# Patient Record
Sex: Female | Born: 1945
Health system: Southern US, Community
[De-identification: ages and names within clinical notes are randomized; demographics above are authoritative.]

## PROBLEM LIST (undated history)

## (undated) DIAGNOSIS — E7800 Pure hypercholesterolemia, unspecified: Secondary | ICD-10-CM

## (undated) DIAGNOSIS — E039 Hypothyroidism, unspecified: Secondary | ICD-10-CM

## (undated) DIAGNOSIS — Z9109 Other allergy status, other than to drugs and biological substances: Secondary | ICD-10-CM

## (undated) DIAGNOSIS — M1990 Unspecified osteoarthritis, unspecified site: Secondary | ICD-10-CM

## (undated) DIAGNOSIS — K209 Esophagitis, unspecified without bleeding: Secondary | ICD-10-CM

## (undated) DIAGNOSIS — K219 Gastro-esophageal reflux disease without esophagitis: Secondary | ICD-10-CM

## (undated) HISTORY — DX: Hypothyroidism, unspecified: E03.9

## (undated) HISTORY — DX: Gastro-esophageal reflux disease without esophagitis: K21.9

## (undated) HISTORY — DX: Pure hypercholesterolemia, unspecified: E78.00

## (undated) HISTORY — DX: Esophagitis, unspecified: K20.9

## (undated) HISTORY — DX: Unspecified osteoarthritis, unspecified site: M19.90

## (undated) HISTORY — DX: Esophagitis, unspecified without bleeding: K20.90

## (undated) HISTORY — DX: Other allergy status, other than to drugs and biological substances: Z91.09

---

## 1965-06-13 HISTORY — PX: ANKLE SURGERY: SHX546

## 1968-06-13 HISTORY — PX: WRIST SURGERY: SHX841

## 1978-06-13 HISTORY — PX: VAGINAL HYSTERECTOMY: SHX2639

## 1984-06-13 HISTORY — PX: RHINOPLASTY: SUR1284

## 2000-10-12 ENCOUNTER — Encounter: Payer: Self-pay | Admitting: Gastroenterology

## 2000-10-12 ENCOUNTER — Encounter: Admission: RE | Admit: 2000-10-12 | Discharge: 2000-10-12 | Payer: Self-pay | Admitting: Gastroenterology

## 2000-10-18 ENCOUNTER — Ambulatory Visit (HOSPITAL_COMMUNITY): Admission: RE | Admit: 2000-10-18 | Discharge: 2000-10-18 | Payer: Self-pay | Admitting: Gastroenterology

## 2000-10-18 ENCOUNTER — Encounter (INDEPENDENT_AMBULATORY_CARE_PROVIDER_SITE_OTHER): Payer: Self-pay | Admitting: Specialist

## 2001-02-14 ENCOUNTER — Encounter (INDEPENDENT_AMBULATORY_CARE_PROVIDER_SITE_OTHER): Payer: Self-pay | Admitting: Specialist

## 2001-02-14 ENCOUNTER — Ambulatory Visit (HOSPITAL_COMMUNITY): Admission: RE | Admit: 2001-02-14 | Discharge: 2001-02-14 | Payer: Self-pay | Admitting: Gastroenterology

## 2001-04-26 ENCOUNTER — Ambulatory Visit (HOSPITAL_COMMUNITY): Admission: RE | Admit: 2001-04-26 | Discharge: 2001-04-26 | Payer: Self-pay | Admitting: Gastroenterology

## 2001-04-26 ENCOUNTER — Encounter (INDEPENDENT_AMBULATORY_CARE_PROVIDER_SITE_OTHER): Payer: Self-pay | Admitting: Specialist

## 2001-07-25 ENCOUNTER — Encounter (INDEPENDENT_AMBULATORY_CARE_PROVIDER_SITE_OTHER): Payer: Self-pay

## 2001-07-25 ENCOUNTER — Ambulatory Visit (HOSPITAL_COMMUNITY): Admission: RE | Admit: 2001-07-25 | Discharge: 2001-07-25 | Payer: Self-pay | Admitting: Gastroenterology

## 2002-04-30 ENCOUNTER — Encounter (INDEPENDENT_AMBULATORY_CARE_PROVIDER_SITE_OTHER): Payer: Self-pay | Admitting: Specialist

## 2002-04-30 ENCOUNTER — Ambulatory Visit (HOSPITAL_COMMUNITY): Admission: RE | Admit: 2002-04-30 | Discharge: 2002-04-30 | Payer: Self-pay | Admitting: Gastroenterology

## 2002-08-19 ENCOUNTER — Ambulatory Visit (HOSPITAL_COMMUNITY): Admission: RE | Admit: 2002-08-19 | Discharge: 2002-08-19 | Payer: Self-pay | Admitting: Gastroenterology

## 2002-08-19 ENCOUNTER — Encounter (INDEPENDENT_AMBULATORY_CARE_PROVIDER_SITE_OTHER): Payer: Self-pay | Admitting: *Deleted

## 2003-03-24 ENCOUNTER — Ambulatory Visit (HOSPITAL_COMMUNITY): Admission: RE | Admit: 2003-03-24 | Discharge: 2003-03-24 | Payer: Self-pay | Admitting: Gastroenterology

## 2003-03-24 ENCOUNTER — Encounter (INDEPENDENT_AMBULATORY_CARE_PROVIDER_SITE_OTHER): Payer: Self-pay | Admitting: *Deleted

## 2004-07-13 ENCOUNTER — Ambulatory Visit: Payer: Self-pay | Admitting: Otolaryngology

## 2005-05-13 HISTORY — PX: NASAL SINUS SURGERY: SHX719

## 2005-05-19 ENCOUNTER — Ambulatory Visit: Payer: Self-pay | Admitting: Otolaryngology

## 2006-04-03 ENCOUNTER — Ambulatory Visit (HOSPITAL_COMMUNITY): Admission: RE | Admit: 2006-04-03 | Discharge: 2006-04-03 | Payer: Self-pay | Admitting: Gastroenterology

## 2006-04-03 ENCOUNTER — Encounter (INDEPENDENT_AMBULATORY_CARE_PROVIDER_SITE_OTHER): Payer: Self-pay | Admitting: *Deleted

## 2007-11-19 ENCOUNTER — Ambulatory Visit: Payer: Self-pay | Admitting: General Practice

## 2007-12-03 ENCOUNTER — Ambulatory Visit: Payer: Self-pay | Admitting: General Practice

## 2007-12-03 HISTORY — PX: KNEE ARTHROSCOPY: SHX127

## 2008-01-23 ENCOUNTER — Encounter: Payer: Self-pay | Admitting: General Practice

## 2008-02-12 ENCOUNTER — Encounter: Payer: Self-pay | Admitting: General Practice

## 2008-05-22 ENCOUNTER — Ambulatory Visit: Payer: Self-pay | Admitting: General Practice

## 2008-06-25 ENCOUNTER — Ambulatory Visit: Payer: Self-pay | Admitting: General Practice

## 2008-06-25 HISTORY — PX: KNEE ARTHROSCOPY: SHX127

## 2009-01-04 HISTORY — PX: REPLACEMENT TOTAL KNEE: SUR1224

## 2009-01-06 ENCOUNTER — Inpatient Hospital Stay (HOSPITAL_COMMUNITY): Admission: RE | Admit: 2009-01-06 | Discharge: 2009-01-10 | Payer: Self-pay | Admitting: Specialist

## 2010-09-19 LAB — CBC
HCT: 24.5 % — ABNORMAL LOW (ref 36.0–46.0)
HCT: 25.3 % — ABNORMAL LOW (ref 36.0–46.0)
HCT: 26.9 % — ABNORMAL LOW (ref 36.0–46.0)
Hemoglobin: 8.4 g/dL — ABNORMAL LOW (ref 12.0–15.0)
Hemoglobin: 8.7 g/dL — ABNORMAL LOW (ref 12.0–15.0)
Hemoglobin: 9.3 g/dL — ABNORMAL LOW (ref 12.0–15.0)
MCHC: 34.2 g/dL (ref 30.0–36.0)
MCHC: 34.5 g/dL (ref 30.0–36.0)
MCHC: 34.5 g/dL (ref 30.0–36.0)
MCV: 97.4 fL (ref 78.0–100.0)
MCV: 98.1 fL (ref 78.0–100.0)
MCV: 98.2 fL (ref 78.0–100.0)
Platelets: 174 10*3/uL (ref 150–400)
Platelets: 175 10*3/uL (ref 150–400)
Platelets: 180 10*3/uL (ref 150–400)
RBC: 2.5 MIL/uL — ABNORMAL LOW (ref 3.87–5.11)
RBC: 2.6 MIL/uL — ABNORMAL LOW (ref 3.87–5.11)
RBC: 2.74 MIL/uL — ABNORMAL LOW (ref 3.87–5.11)
RDW: 12.2 % (ref 11.5–15.5)
RDW: 12.5 % (ref 11.5–15.5)
RDW: 12.6 % (ref 11.5–15.5)
WBC: 7.5 10*3/uL (ref 4.0–10.5)
WBC: 8.7 10*3/uL (ref 4.0–10.5)
WBC: 8.9 10*3/uL (ref 4.0–10.5)

## 2010-09-19 LAB — BASIC METABOLIC PANEL WITH GFR
BUN: 5 mg/dL — ABNORMAL LOW (ref 6–23)
BUN: 6 mg/dL (ref 6–23)
CO2: 29 meq/L (ref 19–32)
CO2: 30 meq/L (ref 19–32)
Calcium: 8 mg/dL — ABNORMAL LOW (ref 8.4–10.5)
Calcium: 8.8 mg/dL (ref 8.4–10.5)
Chloride: 100 meq/L (ref 96–112)
Chloride: 104 meq/L (ref 96–112)
Creatinine, Ser: 0.53 mg/dL (ref 0.4–1.2)
Creatinine, Ser: 0.56 mg/dL (ref 0.4–1.2)
GFR calc non Af Amer: >60 mL/min
GFR calc non Af Amer: >60 mL/min
Glucose, Bld: 121 mg/dL — ABNORMAL HIGH (ref 70–99)
Glucose, Bld: 144 mg/dL — ABNORMAL HIGH (ref 70–99)
Potassium: 3.9 meq/L (ref 3.5–5.1)
Potassium: 4.1 meq/L (ref 3.5–5.1)
Sodium: 133 meq/L — ABNORMAL LOW (ref 135–145)
Sodium: 139 meq/L (ref 135–145)

## 2010-09-19 LAB — CROSSMATCH
ABO/RH(D): A POS
Antibody Screen: NEGATIVE

## 2010-09-19 LAB — ABO/RH: ABO/RH(D): A POS

## 2010-10-26 NOTE — Op Note (Signed)
[name], [name]                 ACCOUNT NO.:  [id number]   MEDICAL RECORD NO.:  [id number]          PATIENT TYPE:  INP   LOCATION:  0007                         FACILITY:  [facility]   PHYSICIAN:  Erasmo Leventhal, M.D.DATE OF BIRTH:  [birth year]   DATE OF PROCEDURE:  01/06/2009  DATE OF DISCHARGE:                               OPERATIVE REPORT   PREOPERATIVE DIAGNOSIS:  Left knee end-stage osteoarthritis.   POSTOPERATIVE DIAGNOSIS:  Left knee end-stage osteoarthritis.   PROCEDURE:  Left total knee arthroplasty.   SURGEON:  Valma Cava, M.D.   ASSISTANT:  Oneida Alar, P.A.-C.   ANESTHESIA:  Spinal with Duramorph, monitored anesthesia care.   BLOOD LOSS:  Less 50 mL.   DRAINS:  One medium Hemovac.   COMPLICATIONS:  None.   TOURNIQUET TIME:  1 hour and 50 minutes at 300 mmHg.   OPERATIVE IMPLANTS:  [employer].  Sigma.  Size 4 narrow  femur, size 3 tibia, size 32 patella, 10-mm posterior stabilized  rotating platform tibial insert.  All cemented.   OPERATIVE DETAILS:  The patient and family counseled in holding area.  Correct side was identified.  She was taken to the operating room where  a spinal anesthetic was administered and monitored anesthesia care.  Foley catheter was placed utilizing sterile technique by the OR  circulating nurse.  All extremities were well padded with bump.  It was  noted she had a malunion of a tibia-fibular fracture from the 1970s.  This was calculated into her surgical event.   She was elevated.  She was prepped with DuraPrep and draped in sterile  fashion.  Exsanguinated with Esmarch, and tourniquet was inflated to 300  mmHg.  Straight midline incision was made in the skin and subcutaneous  tissue.  Medial and lateral soft tissue flaps were developed.  Medial  parapatellar arthrotomy was performed, and proximal medial soft tissue  release was then done.  Knee was then flexed, end-stage arthritis  changes, bone against bone.   Cruciate ligaments were resected.  Starter  hole made in the femur.  Canal was irrigated until the effluent was  clear.  An intramedullary rod was placed.  A 5 degrees valgus cut was  done with a 10-mm cut off the distal femur.  Distal femur was found be a  size #4.  Rotation marks set, cutting block was applied, cut to a size  #4.   Medial and lateral menisci were removed under direct visualization.  At  this point in time, we spent quite a bit of time with extramedullary  alignment, trying to calculate for mechanical axis based upon  consideration for her malunion.  This was done meticulously, posterior  degree slope.  We took a 10-mm cut off the proximal tibial and then  supplemented with __________ mm flexion extension blocks.  Posterior  medial and posterior femoral osteophytes were removed under direct  visualization.  At this time, with flexion and extension blocks for a 10-  mm insert, we had well-balanced flexion and extension gaps.  She had  excellent clinical alignment.  Tibia was found  be a size #3.  Rotation  coverage was set utilizing assistance of extramedullary alignment also.  Reamer was performed and a punch.  Femoral box cut was performed at this  time with a size 4 narrow femur, size 3 tibia, 10 insert and were well-  balanced flexion and extension in excellent alignment, approximately 5  degrees of valgus anatomical axis with mechanical axis going directly to  the center of the ankle joint.  Patella found be a size #32, rotation  covered.  Appropriate amount of bone was resected and patella tracked  anatomically.  All trials were removed, knee was irrigated with  pulsatile lavage and cement was mixed.  At this point in time, all  components were cemented into place, particularly low modern cement  technique, size 3 tibia, size 4 narrow femur, size 32 patella.  After  cement had cured, we did trials of 10 and 12.5-mm inserts.  With a 10  insert, was well balanced in  flexion and extension.  She was stable to  varus and valgus stress at 0, 30, 60, and 90 degrees of flexion.  Trials  were removed.  Knee was irrigated.  Excess cement was removed and put in  a final 10 mm posterior stabilized rotating platform tibial insert.  We  now had excellent clinical alignment, rotation, balance and coverage.  Medial Hemovac drain was placed.  Sequential closure of layers was done.  Arthrotomy was closed in 90 degrees of flexion with Vicryl suture in  figure-of-eight fashion, subcutaneous Vicryl, skin with a subcuticular  Monocryl suture.  Steri-Strips were applied.  Sterile dressings applied.  Tourniquet was deflated.  Normal circulation in the foot and ankle at  the end of the case.  She was given __________ Ancef intravenously.  The  patient tolerated the procedure well, and there were no complications or  problems.  She was taken from the operating room to the PACU in stable  condition.   Surgeon was assisted with Oneida Alar, P.A.-C.  To help with surgical  technique and decision making, Mr. Leana Gamer, P.A.-C., assistance was  needed throughout the entire case.           ______________________________  Erasmo Leventhal, M.D.     RAC/MEDQ  D:  01/06/2009  T:  01/07/2009  Job:  [id number]

## 2010-10-29 NOTE — Op Note (Signed)
   NAMEABBI, MANCINI                           ACCOUNT NO.:  [id number]   MEDICAL RECORD NO.:  [id number]                   PATIENT TYPE:  AMB   LOCATION:  ENDO                                 FACILITY:  [facility]   PHYSICIAN:  Anselmo Rod, M.D.               DATE OF BIRTH:  [birth year]   DATE OF PROCEDURE:  03/24/2003  DATE OF DISCHARGE:                                 OPERATIVE REPORT   PROCEDURE PERFORMED:  Screening colonoscopy.   ENDOSCOPIST:  Anselmo Rod, M.D.   INSTRUMENT USED:  Pediatric adjustable Olympus colonoscope.   INDICATION FOR PROCEDURE:  A 65 year old white female undergoing screening  colonoscopy to rule out colonic polyps, masses, etc.   PREPROCEDURE PREPARATION:  Informed consent was procured from the patient.  The patient fasted for eight hours prior to the procedure and prepped with a  bottle of magnesium citrate and a gallon of GoLYTELY the night prior to the  procedure.   PREPROCEDURE PHYSICAL EXAMINATION:  VITAL SIGNS:  The patient had stable  vital signs.  NECK:  Supple.  CHEST:  Clear to auscultation.  HEART:  S1 and S2 regular.  ABDOMEN:  Soft with normal bowel sounds.   DESCRIPTION OF PROCEDURE:  The patient was placed in the left lateral  decubitus position and sedated with 20 mg of Demerol and 2 mg of Versed  intravenously.  Once the patient was adequately sedated and maintained on  low-flow oxygen and continuous cardiac monitoring, the Olympus video  colonoscope was advanced from the rectum to the cecum without difficulty,  except for small internal hemorrhoids seen on retroflexion.  No other  abnormalities were recognized.  The appendiceal orifices and ileocecal valve  were clearly visualized and photographed.  The entire exam revealed healthy-  appearing colonic mucosa.  No masses, polyps, erosions, ulcerations, or  diverticula were seen.   IMPRESSION:  Normal colonoscopy, except for small internal hemorrhoids.     RECOMMENDATIONS:  1. Continue high-fiber diet.  2. Repeat screen in the next 10 years unless the patient develops abnormal     symptoms.                                               Anselmo Rod, M.D.    JNM/MEDQ  D:  03/24/2003  T:  03/24/2003  Job:  [id number]   cc:   [place]ale [place]  [address]  [place]  [place] [address]  Fax: [phone]   Adolph Pollack, M.D.  [address]  [place]  [place] [address]  Fax: [phone]

## 2010-10-29 NOTE — Op Note (Signed)
   NAMEVELMA, [name]                           ACCOUNT NO.:  [id number]   MEDICAL RECORD NO.:  [id number]                   PATIENT TYPE:  AMB   LOCATION:  ENDO                                 FACILITY:  [facility]   PHYSICIAN:  Anselmo Rod, M.D.               DATE OF BIRTH:  1946-05-12   DATE OF PROCEDURE:  04/30/2002  DATE OF DISCHARGE:                                 OPERATIVE REPORT   PROCEDURE:  Panendoscope with biopsies.   INSTRUMENT USED:  Olympus video panendoscope   INDICATIONS FOR PROCEDURE:  Adenomatous change seen in the prominent  duodenal fold in the past. The patient has had ablation of this lesion.  Repeat endoscopy is being done to rule out recurrence.   Preprocedure preparation, informed consent was obtained from the patient.  The patient had fasted for eight hours prior to the procedure.  Preprocedure  physical revealed the patient had stable vital signs.  Neck supple.  Chest  clear to auscultation. Abdomen soft with normal bowel sounds.   DESCRIPTION OF PROCEDURE:  The patient was placed in the left lateral  decubitus and sedated with 60 mg of Demerol and 6 mg of  Versed  intravenously.  Once the patient was adequately sedated, maintained on low  flow oxygen and continuous cardiac monitoring, the Olympus video  panendoscope was advanced through the mouth piece, over the tongue into the  esophagus and after evaluation the entire esophagus appeared normal, with no  evidence of  ring stricture, masses, esophagitis or Barrett's .  The scope  was then advanced in the stomach. There is mild diffuse gastritis. No  ulcers, erosions, masses or polyps were seen. Retroflexion revealed no acute  abnormalities.  The duodenal bulb appeared normal and on advancing the scope  in the post bulbar area, the site where the previous polypectomy was  identified and random biopsy was done around the base of the lesion to rule  any residual adenomatous change The rest of the  proximal small bowel  appeared normal up to 60 cm.   IMPRESSION:  Scarring seen in the mucosa around the post bulbar area where  previous adenomatous polyp was ablated.  Repeat biopsies and results are  pending.   RECOMMENDATIONS:  1. Await pathology results.  2. Plan a colonoscopy in the near future.  3. Further recommendations once pathology results have been checked.                                               Anselmo Rod, M.D.    JNM/MEDQ  D:  04/30/2002  T:  04/30/2002  Job:  [id number]   cc:   Hansel Feinstein, M.D.

## 2010-10-29 NOTE — H&P (Signed)
[name]                ACCOUNT NO.:  [id number]   MEDICAL RECORD NO.:  [id number]           PATIENT TYPE:  INP   LOCATION:                               FACILITY:  [facility]   PHYSICIAN:  Erasmo Leventhal, M.D.DATE OF BIRTH:  1946-06-04   DATE OF ADMISSION:  01/06/2009  DATE OF DISCHARGE:                              HISTORY & PHYSICAL   CHIEF COMPLAINT:  Painful left knee.   HISTORY OF PRESENT ILLNESS:  The patient is a 65 year old female with a  painful range of motion and weightbearing of her left knee.  The patient  has failed conservative treatment of that left knee.  The patient has  bone-on-bone medial compartment of the knee on x-rays.  The patient  would like to proceed with a total knee arthroplasty.   PRIMARY CARE PHYSICIAN:  Dr. Dale Kaser.   GASTROENTEROLOGIST:  Dr. Bea Laura.  We have received clearance from  both physicians.   ALLERGIES:  No known drug allergies, but  was ADVISED NOT TO TAKE  IBUPROFEN DUE TO HER STOMACH.   CURRENT MEDICATIONS:  1. Omeprazole 20 mg a day.  2. Levothyroxine 50 mcg alternating with 75 mcg every other day.  3. Crestor 5 mg a day.  4. Tylenol arthritis p.r.n.  5. Vitamin D.  6. Omega III oils.  7. Primrose oil.  8. Garlic  9. Vitamin E  10.Coenzyme-Q10.  11.Zinc.  12.Vitamin C.  13.Mega-B 100.  14.Glucosamine.  15.Nasacort or Nasonex as needed.   PRESENT MEDICAL HISTORY:  1. Includes a hiatal hernia.  2. 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  3. Hypercholesterolemia.  4. Present sinus congestion.  5. Thyroid disease.   REVIEW OF SYSTEMS:  NEUROLOGIC:  Unremarkable.  PULMONARY:  Unremarkable  other than some present upper sinus congestion.  She is going to see her  medical doctor today for this.  She is currently using Nasonex.  She  denies any fevers or chills.  CARDIOVASCULAR:  She denies any previous heart attack.  No chest pains,  no irregular heart rhythms.  Just has elevated  cholesterol.  GASTROINTESTINAL REVIEW OF SYSTEMS:  Positive for hiatal hernia.  She  does have a history of a precancerous lesion in her stomach in 2002  treated by Dr. Bea Laura.  No problems with hepatitis, irritable  bowel, diverticulitis.  GU:  Unremarkable.  ENDOCRINE.  She just has  well controlled thyroid disease.  No diabetes.  ORTHOPEDIC:  She does  have some osteoporosis and degenerative disk disease in her lower back.  She is postmenopausal.   SURGICAL HISTORY:  Includes right ankle, left wrist, partial  hysterectomy, rhinoplasty, sinus surgery, left knee arthroscopy x2  without any complications with anesthesia.   FAMILY MEDICAL HISTORY:  Father is age 47 with a history of a heart  attack.  Mother has a history of pulmonary issues at 83.  Marital:  Patient is married.  She is a housewife.  She has smoked in the past.  She does not use alcohol or drugs.  She does live with her family  in a  two-level home.  She does have a living will and a power-of-attorney.   PHYSICAL EXAM:  VITALS:  Height is 5 feet 3-1/2 inches, weight is 142,  blood pressure 132/70, pulse 74, regular.  Respirations 12, nonlabored,  patient is afebrile.  GENERAL:  This is a healthy-appearing, well-developed female conscious,  alert, appropriate, easily gets on and off the exam table without any  difficulty.  HEENT:  Head is normocephalic.  Pupils equal, round, reactive.  Gross  hearing is intact.  NECK:  Supple.  No palpable lymphadenopathy.  Good range of motion.  CHEST:  Lung sounds were clear and equal bilaterally.  HEART:  Regular rate and rhythm.  No murmurs, rubs or gallops.  ABDOMEN:  Soft.  Bowel sounds present.  EXTREMITIES:  Upper extremities had excellent range of motion without  any difficulties.  LOWER EXTREMITIES:  Both hips had good range of motion.  Both knees:  She was able to fully extend.  She can flex them back to 120.  Her left  knee was a little bit more boggy than her right.   She had well-healed  surgical incisions arthroscopy on the left.  She has no gross  instability.  No effusion.  No signs of infection.  Both calves were  soft, nontender.  Good both ankle motion.  PERIPHERAL VASCULAR:  Carotid pulses were 2+, no bruits.  Radial pulses  were 2+, posterior tibial pulses were 2+.  She had no lower extremity  venous stasis changes.  BREAST/RECTAL/GU:  Exams deferred at this time.   IMPRESSION:  1. End-stage osteoarthritis left knee with bone-on-bone medial      compartment with some varus deformity.  2. Hypercholesterolemia.  3. Thyroid disease.  4. Sinus congestion.  5. Hiatal hernia.  6. History of precancerous tumor of the abdomen 2002.   PLAN:  The patient will undergo all routine laboratories and tests prior  to having a left total knee arthroplasty by Dr. Thomasena Edis at [facility] on January 06, 2009.  The patient has gotten medical clearance  from Dr. Lorin Picket and Dr. Bea Laura for this procedure.      Jamelle Rushing, P.A.    ______________________________  Erasmo Leventhal, M.D.    RWK/MEDQ  D:  12/22/2008  T:  12/22/2008  Job:  [id number]   cc:   Erasmo Leventhal, M.D.  Fax: [phone]

## 2010-10-29 NOTE — Op Note (Signed)
NAME:  PLESHETTE, TOMASINI                           ACCOUNT NO.:  [id number]   MEDICAL RECORD NO.:  [id number]                   PATIENT TYPE:  AMB   LOCATION:  ENDO                                 FACILITY:  [facility]   PHYSICIAN:  Anselmo Rod, M.D.               DATE OF BIRTH:  Feb 13, 1946   DATE OF PROCEDURE:  08/19/2002  DATE OF DISCHARGE:                                 OPERATIVE REPORT   PROCEDURE PERFORMED:  Esophagogastroduodenoscopy with biopsies times four of  an edematous fold and EPC ablation of this lesion.   ENDOSCOPIST:  Charna Elizabeth, M.D.   INSTRUMENT USED:  Olympus video panendoscope.   PREPROCEDURE PREPARATION:  Informed consent was procured from the patient.  The patient was fasted for eight hours prior to the procedure.   PREPROCEDURE PHYSICAL:  The patient had stable vital signs.  Neck supple,  chest clear to auscultation.  S1, S2 regular.  Abdomen soft with normal  bowel sounds.   DESCRIPTION OF PROCEDURE:  The patient was placed in the left lateral  decubitus position and sedated with 70 mg of Demerol and 7 mg of Versed  intravenously.  Once the patient was adequately sedated and maintained on  low-flow oxygen and continuous cardiac monitoring, the Olympus video  panendoscope was advanced through the mouth piece over the tongue into the  esophagus under direct vision.  The entire esophagus appeared normal with no  evidence of ring, stricture, masses, esophagitis or Barrett's mucosa.  The  scope was then advanced to the stomach.  There was some debris in the  stomach and this was washed away easily.  Retroflexion revealed no  abnormalities.  The antrum and the body appeared normal as well.  The  duodenal bulb was healthy and without lesions.  A small edematous fold was  seen in the post bulbar area.  This was the site where a previous biopsy and  ablation had been done.  Therefore, this area was biopsied and then ablated  with the [facility] coagulator.  The patient  tolerated the procedure well without  complication.   IMPRESSION:  Fold in post bulbar area biopsied and ablated with the [facility]  coagulator.  Otherwise normal esophagogastroduodenoscopy.    RECOMMENDATIONS:  1. Await pathology results.  Other recommendations made thereafter.  2. Avoid nonsteroidals including aspirin for the next two weeks.                                                 Anselmo Rod, M.D.    JNM/MEDQ  D:  08/19/2002  T:  08/19/2002  Job:  [id number]   cc:   Dale [place]  [address]  [place]  [place] [address]  Fax: [phone]   Adolph Pollack, M.D.  [address]  [place]  [place] [address]  Fax: [phone]

## 2010-10-29 NOTE — Op Note (Signed)
. [facility]  Patient:    [name], [name] Visit Number: [id number] MRN: [id number]          Service Type: END Location: ENDO Attending Physician:  Charna Elizabeth Proc. Date: 02/14/01 Admit Date:  02/14/2001   CC:         Adolph Pollack, M.D.   Operative Report  DATE OF BIRTH:  10/03/1945  PROCEDURE:  Esophagogastroduodenoscopy with biopsies.  ENDOSCOPIST:  Anselmo Rod, M.D.  INSTRUMENT:  Olympus video panendoscope.  INDICATIONS:  History of an adenomatous lesion biopsied from the postbulbar area about six months ago.  Repeat EGD is being done to rebiopsy this abnormal fold in the small bowel and to rule out malignancy.  PREPROCEDURE PREPARATION:  Informed consent was procured from the patient. The patient was fasted for eight hours prior to the procedure.  PREPROCEDURE PHYSICAL EXAMINATION:  VITAL SIGNS: Stable.  NECK: Supple. CHEST: Clear to auscultation.  S1 and S2 regular.  ABDOMEN: Soft with normal bowel sounds.  DESCRIPTION OF PROCEDURE:  The patient was placed in the left lateral decubitus position and sedated with 50 mg of Demerol and 5 mg of Versed intravenously.  Once the patient was adequately sedated and maintained on low flow oxygen and continuous cardiac monitoring, the Olympus video panendoscope was advanced through the mouthpiece, over the tongue, and into the esophagus under direct vision.  The entire esophagus appeared normal without evidence of ring, stricture, mass, esophagitis, or Barretts mucosa.  The esophagus was widely patent.  There was moderate diffuse gastritis on examination of the stomach.  No ulcers were seen.  There was an irregular area in the postbulbar region that was biopsied for pathology to rule out adenomatous change with some malignancy.  The rest of the small bowel up to 60 cm appeared normal.  IMPRESSION: 1. Normal appearing esophagus. 2. Moderate diffuse gastritis. 3. Irregular  fold in the postbulbar area, biopsy to rule out adenoma versus    dysplasia.  RECOMMENDATIONS:  Await pathology results.  Outpatient follow-up for further recommendations. Attending Physician:  Charna Elizabeth DD:  02/14/01 TD:  02/14/01 Job: [id number]

## 2010-10-29 NOTE — Procedures (Signed)
[name]. [facility]  Patient:    [name], [name]                        MRN: [id number] Proc. Date: 10/18/00 Adm. Date:  [id number] Attending:  Charna Elizabeth CC:         Adolph Pollack, M.D.  Dr. Dale Marl[place], [place]   Procedure Report  DATE OF BIRTH:  1945/11/02  REFERRING PHYSICIAN:  PROCEDURE PERFORMED:  Esophagogastroduodenoscopy with injection of epinephrine into a small flat lesion in the post bulbar area with multiple hot biopsies.  ENDOSCOPIST:  Anselmo Rod, M.D.  INSTRUMENT USED:  Olympus video panendoscope and pediatric colonoscope.  INDICATIONS FOR PROCEDURE:  Tubal adenoma biopsied during an EGD done in the recent past for dilatation.  Incidentally, a small lesion was biopsied from the post bulbar area.  This turned out to the tubal adenoma on pathology and therefore repeat EGD is being done in an attempt to try to remove this lesion endoscopically.  PREPROCEDURE PREPARATION:  Informed consent was procured from the patient. The patient was asked to stay off all nonsteroidals including aspirin prior to the procedure.  The patient was fasted for eight hours prior to the procedure.  The risks of bleeding, perforation, etc. were discussed with the patient in great detail.  PREPROCEDURE PHYSICAL:  The patient had stable vital signs.  Neck supple. Chest clear to auscultation.  S1, S2 regular.  Abdomen soft with normal abdominal bowel sounds.  No masses palpable.  DESCRIPTION OF PROCEDURE:  The patient was placed in left lateral decubitus position and sedated with 100 mg of Demerol and 10 mg of Versed in slow incremental doses.  Once the patient was adequately sedated and maintained on low-flow oxygen and continuous cardiac monitoring, the Olympus video panendoscope was advanced through the mouthpiece, over the tongue, into the esophagus under direct vision.  The entire esophagus was appeared widely patent and healthy  without evidence of ring, stricture, masses, lesions, esophagitis, or Barretts mucosa.  The scope was then advanced to the stomach. A small hiatal hernia was seen on high retroflexion.  The rest of the gastric mucosa appeared healthy.  The patient had a long J-shaped stomach.  There was some erythema around the pylorus consistent with antral gastritis.  The duodenal bulb appeared healthy.  There was a small flat lesion seen in the postbulbar area that was biopsied with a hot biopsy forceps after injecting the base with 2 cc of epinephrine.  The mucosa seemed friable and bled easily. The patient tolerated the procedure well.  The rest of the small bowel up to 80 cm appeared healthy and without lesions.  This was confirmed with the pediatric colonoscope.  IMPRESSION: 1. Small flat lesion in postbulbar area, biopsied for pathology. 2. Widely patent esophagus. 3. Small hiatal hernia. 4. Antral gastritis. 5. Normal small bowel distal to the small lesion biopsied as mentioned above    up to 80 cm.  RECOMMENDATIONS: 1. Avoid all nonsteroidals including aspirin for now. 2. Await pathology results. 3. Start Nexium 40 mg 1 p.o. q.d. 4. Outpatient follow-up in the next two weeks. 5. Repeat esophagogastroduodenoscopy in the next three months to relook    at this area again and to document total removal of the adenoma.  RECOMMENDATION: DD:  10/18/00 TD:  10/18/00 Job: [id number]

## 2010-10-29 NOTE — Op Note (Signed)
NAMECARLINDA, OHLSON                 ACCOUNT NO.:  [id number]   MEDICAL RECORD NO.:  [id number]          PATIENT TYPE:  AMB   LOCATION:  ENDO                         FACILITY:  [facility]   PHYSICIAN:  Anselmo Rod, M.D.  DATE OF BIRTH:  1946-05-27   DATE OF PROCEDURE:  DATE OF DISCHARGE:                                 OPERATIVE REPORT   PROCEDURE PERFORMED:  Esophagogastroduodenoscopy with antral biopsies.   ENDOSCOPIST:  Anselmo Rod, M.D.   INSTRUMENT USED:  Olympus video panendoscope.   INDICATIONS FOR PROCEDURE:  A 65 year old white female with a history of  reflux and a small bowel adenoma, undergoing surveillance EGD to rule out  peptic ulcer disease, esophagitis,  gastritis, etc.   PREPROCEDURE PREPARATION:  Informed consent was procured from the patient  after the risks and benefits of the procedure were discussed with her in  great detail.   PREPROCEDURE PHYSICAL:  VITAL SIGNS:  Stable.  NECK:  Supple.  CHEST:  Clear to auscultation. S1, S2, regular.  ABDOMEN:  Soft with normal bowel sounds.   DESCRIPTION OF PROCEDURE:  The patient was placed in left lateral decubitus  position, sedated with 60 mcg of Fentanyl and 5 mg of Versed in slow,  incremental doses.  Once the patient is adequately sedated, maintained on  low flow oxygen and continuous cardiac monitoring, the Olympus video  panendoscope was advanced through the mouthpiece over the tongue into the  esophagus under direct vision.  The entire esophagus appeared normal with no  evidence of rings, sutures, masses, esophagitis or Barrett's mucosa. The  scope was then advanced into the stomach.  Antral gastritis was noted.  Biopsies were done to rule out presence of Helicobacter pylori by pathology.  The proximal small bowel appeared normal.  There was no altered obstruction.  The patient tolerated the procedure well without images, complications.  No  abnormalities were noted in the proximal small bowel.  No ulcer  disease,  lesions, masses or polyps were identified.   IMPRESSION:  1. Normal appearing esophagus.  2. Antral gastritis, biopsies done for Helicobacter pylori.  3. Normal proximal small bowel.  No masses or polyps seen.  4. No evidence of a hiatal hernia on high retroflexion.   RECOMMENDATIONS:  1. Continue proton pump inhibitors.  2. Outpatient follow up as need arises in the future.      Anselmo Rod, M.D.  Electronically Signed     JNM/MEDQ  D:  04/03/2006  T:  04/04/2006  Job:  [id number]   cc:   Dale Crowley

## 2010-10-29 NOTE — Op Note (Signed)
[name], [name]                           ACCOUNT NO.:  [id number]   MEDICAL RECORD NO.:  [id number]                   PATIENT TYPE:  AMB   LOCATION:  ENDO                                 FACILITY:  [facility]   PHYSICIAN:  Anselmo Rod, M.D.               DATE OF BIRTH:  [birth year]   DATE OF PROCEDURE:  03/24/2003  DATE OF DISCHARGE:                                 OPERATIVE REPORT   PROCEDURE PERFORMED:  Esophagogastroduodenoscopy with small bowel biopsies.   ENDOSCOPIST:  Anselmo Rod, M.D.   INSTRUMENT USED:  Olympus video panendoscope.   INDICATION FOR PROCEDURE:  History of an adenomatous polyp biopsied in the  postbulbar area.  Repeat EGD is being done to re-biopsy this area.  Rule out  dysplasia versus malignancy.   PREPROCEDURE PREPARATION:  Informed consent was procured from the patient.  The patient fasted for eight hours prior to the procedure.   PREPROCEDURE PHYSICAL:  VITAL SIGNS:  The patient had stable vital signs.  NECK:  Supple.  CHEST:  Clear to auscultation.  S1, S2 regular.  ABDOMEN:  Soft with normal bowel sounds.   DESCRIPTION OF PROCEDURE:  The patient was placed in the left lateral  decubitus position and sedated with 60 mg of Demerol and 6 mg of Versed  intravenously.  Once the patient was adequately sedate and maintained on low-  flow oxygen and continuous cardiac monitoring, the Olympus video  panendoscope was advanced through the mouthpiece, over the tongue, into the  esophagus under direct vision.  The area where the previous biopsies were  done was recognized, and this area was re-biopsied to rule out dysplasia  versus persistent adenomatous change.  There was no mass or polyp noticed at  this site.  There was some scar tissue, and I suspect the changes at this  site have completely resolved.  The rest of the exam was normal.  The  patient tolerated the procedure well without complication.  Retroflexion in  the high cardia revealed no  abnormalities.  The entire gastric mucosa and  the esophagus appeared normal.   IMPRESSION:  Small area of scarring in the postbulbar area, re-biopsied to  rule out adenomatous change versus dysplasia.    RECOMMENDATIONS:  1. Await pathology results.  2. Proceed with a colonoscopy at this time.                                               Anselmo Rod, M.D.    JNM/MEDQ  D:  03/24/2003  T:  03/24/2003  Job:  [id number]   cc:   Dale Revloc  [address]  [place]  [place] [address]  Fax: [phone]   Adolph Pollack, M.D.  [phone]  [place]  [place] [address]  Fax: [phone]

## 2010-10-29 NOTE — Procedures (Signed)
[place]  Patient:    [name], [name] Visit Number: [id number] MRN: [id number]          Service Type: END Location: ENDO Attending Physician:  Charna Elizabeth Dictated by:   Anselmo Rod, M.D. Proc. Date: 07/25/01 Admit Date:  07/25/2001   CC:         Adolph Pollack, M.D.   Procedure Report  DATE OF BIRTH:  [birth year]  PROCEDURE PERFORMED:  Esophagogastroduodenoscopy with biopsies, and Argon plasma coagulation of duodenal fold in the post-bulbar area.  PREPROCEDURE PREPARATION:  Informed consent was obtained from the patient. The patient was fasted for 8 hours prior to the procedure.  PREPROCEDURE PHYSICAL EXAMINATION:  VITAL SIGNS:  Stable.  NECK:  Supple.  CHEST:  Clear to auscultation.  HEART:  S1 and S2 regular.  ABDOMEN:  Soft with normal bowel sounds.  DESCRIPTION OF PROCEDURE:  The patient was placed in the left lateral decubitus position, and sedated with 70 mg of Demerol an 7 mg of Versed intravenously.  Once the patient was adequately sedated and maintained on low flow oxygen and continuous cardiac monitoring, the Olympus video panendoscope was advanced through the mouth piece over the tongue into the esophagus under direct vision.  The entire esophagus appeared normal and widely patent.  On advancing the scope into the stomach, there was evidence of moderate diffuse gastritis.  No frank ulcers, erosions, or masses were seen.  The scope was then advanced into the small bowel.  There was an excavated area appreciated in the postbulbar region that was biopsied for pathology to rule out adenomatous change previously seen on biopsies done in this area.  After biopsing the site, this excavated area was ablated with an Argon plasma coagulator.  The patient tolerated the procedure well without complications.  IMPRESSION: 1. Prominent fold biopsy in postbulbar area, and area ablated after biopsies    done with Argon plasma  coagulator. 2. Diffuse gastritis. 3. No ulcers or masses seen. 4. Normal-appearing esophagus.  RECOMMENDATIONS: 1. Await pathology results. 2. Followup esophagogastroduodenoscopy in the next 3 to 6 months. Dictated by:   Anselmo Rod, M.D. Attending Physician:  Charna Elizabeth DD:  07/25/01 TD:  07/25/01 Job: [id number]

## 2010-10-29 NOTE — Discharge Summary (Signed)
NAMECICLALY, MULCAHEY                 ACCOUNT NO.:  [id number]   MEDICAL RECORD NO.:  [id number]          PATIENT TYPE:  INP   LOCATION:  1601                         [facility]ACILITY:  [facility]   PHYSICIAN:  Erasmo Leventhal, M.D.DATE OF BIRTH:  05/20/1946   DATE OF ADMISSION:  01/06/2009  DATE OF DISCHARGE:  01/10/2009                               DISCHARGE SUMMARY   ADMISSION DIAGNOSES:  1. End-stage osteoarthritis left knee, bone on bone medial compartment      with varus deformity.  2. Hypercholesterolemia.  3. Thyroid disease.  4. History of sinus congestion.  5. Hiatal hernia.  6. History of precancerous abdominal tumor.   DISCHARGE DIAGNOSES:  1. Left total knee arthroplasty.  2. Acute postoperative blood loss anemia, asymptomatic, allowed to      self correct with p.o. supplements.  3. History of hypercholesterolemia.  4. History of thyroid disease.  5. History of sinus congestion.  6. History of hiatal hernia.  7. History of precancerous abdominal tumor.   HISTORY OF PRESENT ILLNESS:  The patient is a 65 year old female with a  history of painful range of motion and weightbearing of her left knee.  The patient has failed conservative treatment.  The patient has bone-on-  bone medial compartment of the knee per x-rays.  The patient would like  to proceed with a total knee arthroplasty.   ALLERGIES:  NO KNOWN DRUG ALLERGIES BUT ADVISED NOT TO TAKE ANY  IBUPROFEN DUE TO HER HISTORY OF HER STOMACH.   MEDICATIONS ON ADMISSION:  Omeprazole, levothyroxine, Crestor, Tylenol,  vitamin D, omega 3 oils, primrose oil, garlic, vitamin E, coenzyme Z61,  zinc, vitamin C, mega B100, glucosamine, Nasacort.   SURGICAL PROCEDURE:  On January 06, 2009 the patient was taken to the OR by  Dr. Valma Cava, assisted by Oneida Alar, PA-C under spinal and MAC  anesthesia, underwent a left total knee arthroplasty without any  complications.  There was minimal blood loss.  The patient was  transferred to the recovery room and then to the orthopedic floor in  good condition.  The patient had the following components implanted:  A  size 4 narrow femoral component, a size 3 keeled NBT tray, a size four  10-mm thickness polyethylene bearing and a three peg 32 mm patella.  All  of the components were implanted with polymethyl methacrylate.   CONSULTS:  Only routine consults requested:  Physical therapy, Case  Management and Pharmacy.   HOSPITAL COURSE:  On January 06, 2009 the patient was admitted to [facility] under the care of Dr. Valma Cava.  The patient was  taken to the OR where a left total knee arthroplasty was performed  without any complications.  The patient was transferred to the recovery  room and then to the orthopedic floor in good condition.  The patient  then incurred four days postoperative course in which the patient did  develop some acute postoperative blood loss anemia, but her vital signs  remained stable.  The patient tolerated her blood loss with physical  therapy without any untoward side effects so  this was allowed to self  correct with p.o. supplements.  The patient's vital signs remained  stable, she remained afebrile.  The patient's wound remained benign for  any signs of infection.  Her leg remained neuromotor and vascularly  intact.  The patient did work well with physical therapy on a daily  basis but it was a little bit of slow progress so she required an extra  day.  The patient was able to ambulate in excess of 200 feet on date of  discharge.  CPM was zero to 40 degrees.  The patient was discharged home  in good condition for routine outpatient followup.   LABS:  CBC on initial postoperative day found WBCs 8.9, hemoglobin of  9.3, hematocrit 26.9, platelets 180.  On discharge her hemoglobin and  hematocrit was 8.4/24.5 with 174 platelets.  Routine chemistries on  discharge found sodium of 139, potassium of 3.9, glucose 121, BUN  5,  creatinine 0.56.  Estimated GFR was greater than 60.  EKG on admission  was marked with sinus bradycardia at 46 beats per minute.   DISCHARGE INSTRUCTIONS:   DIET:  No restrictions.   ACTIVITY:  The patient is to increase her activity slowly with the use  of a walker.   WOUND CARE:  The patient is to change her dressing daily, keep her wound  clean and dry.   FOLLOWUP:  She needs a followup appointment with Dr. Thomasena Edis two weeks  from date of surgery, patient is to call [phone] for that followup  appointment.   Home health care agency through [place].   MEDICATIONS:  1. Dilaudid 2 mg one or two every 4 - 6 hours for pain if needed.  2. Robaxin 500 mg one tablet every 6 hours for muscle spasms if      needed.  3. Lovenox injection 30 mg, one injection every 12 hours for a total      of 21 days.  4. One iron tablet twice a day until gone.  5. Colace 100 mg one tablet twice a day while on narcotics.  6. MiraLAX 17 grams once a day for constipation if needed.  7. Omeprazole 20 mg a day.  8. Levothyroxine 50 mcg alternated every other day with 75 mcg.  9. Crestor 5 mg once a day.  10.Tylenol arthritis as needed.  11.Vitamin D 1000 mg once a day.  12.Omega 3 fish oils 1000 mg once a day.  13.Primrose oil once a day.  14.Garlic 1000 mg once a day.  15.Vitamin E 400 international units once a day.  16.Coenzyme Q10 one-hundred and twenty milligrams once a day.  17.Zinc 50 mg once a day.  18.Vitamin C 1000 mg once a day.  19.Mega B100 time release once a day.  20.Glucosamine 1500 mg once a day.  21.Chondroitin 1200 mg once a day.  22.MSN 500 mg once a day.  23.Biotin 1000 mg once a day.  24.Nasacort versus Nasonex nasal spray p.r.n.  25.Celebrex 200 mg once a day.   The patient's condition on discharge to home is listed as improved and  good.      Jamelle Rushing, P.A.    ______________________________  Erasmo Leventhal, M.D.    RWK/MEDQ  D:  01/16/2009   T:  01/16/2009  Job:  [id number]   cc:   Dale Seabeck  Fax: [phone]   Dr. Bea Laura

## 2010-10-29 NOTE — Procedures (Signed)
[place]  Patient:    [name], [name] Visit Number: [id number] MRN: [id number]          Service Type: END Location: ENDO Attending Physician:  Charna Elizabeth Dictated by:   Anselmo Rod, M.D. Proc. Date: 04/26/01 Admit Date:  04/26/2001   CC:         Dale [place], M.D., [place], [place]  Adolph Pollack, M.D.   Procedure Report  DATE OF BIRTH: [birth year]  PROCEDURE: Esophagogastroduodenoscopy with biopsies and Argon plasma coagulator ablation of duodenal adenoma.  ENDOSCOPIST: Anselmo Rod, M.D.  PREPROCEDURE PREPARATION: Informed consent was procured from the patient.  The patient fasted for eight hours prior to the procedure.  PREPROCEDURE PHYSICAL EXAMINATION:  VITAL SIGNS: Stable.  NECK: Supple.  CHEST: Clear to auscultation.  S1 and S2, regular.  ABDOMEN: Soft with normal bowel sounds.  DESCRIPTION OF PROCEDURE: The patient was placed in the left lateral decubitus position and sedated with 70 mg of Demerol and 7 mg of Versed intravenously. Once the patient was adequately sedated and maintained on low-flow oxygen, continuous cardiac monitoring, the Olympus video panendoscope was advanced through the mouthpiece over the tongue into the esophagus under direct vision. The entire esophagus appeared normal without evidence of ring, stricture, mass, lesion, esophagitis, or Barretts mucosa.  The scope was then advanced into the stomach.  There was moderate diffuse gastritis throughout the gastric mucosa.  On visualization of the postbulbar area the patient had a healthy-appearing small bowel.  No abnormalities were noted.  The patient was then turned on her back and a small exudative area was noticed clearly in this position.  Biopsies were done from this area and the base was ablated with Argon plasma coagulator.  The patient tolerated the procedure well without complications.  The small bowel distal to this area  appeared normal as well.  IMPRESSION:  1. Small abnormal fold in the postbulbar area.  Biopsies.  Lesion ablated     with the Argon plasma coagulator.  2. Moderate diffuse gastritis.  3. Small exudative area in the postbulbar area.  Biopsies done.  Lesion     ablated with Argon plasma coagulator.  RECOMMENDATIONS:  1. Await pathology results.  2. Repeat EGD in next two six months to re-evaluate this area.  3. Avoid all nonsteroidals for now. Dictated by:   Anselmo Rod, M.D. Attending Physician:  Charna Elizabeth DD:  04/27/01 TD:  04/27/01 Job: [id number]

## 2012-03-26 ENCOUNTER — Encounter: Payer: Self-pay | Admitting: Internal Medicine

## 2012-03-26 ENCOUNTER — Ambulatory Visit (INDEPENDENT_AMBULATORY_CARE_PROVIDER_SITE_OTHER): Payer: Medicare Other | Admitting: Internal Medicine

## 2012-03-26 VITALS — BP 100/70 | HR 56 | Temp 98.3°F | Ht 63.5 in | Wt 146.0 lb

## 2012-03-26 DIAGNOSIS — Z139 Encounter for screening, unspecified: Secondary | ICD-10-CM

## 2012-03-26 DIAGNOSIS — K219 Gastro-esophageal reflux disease without esophagitis: Secondary | ICD-10-CM | POA: Insufficient documentation

## 2012-03-26 DIAGNOSIS — E039 Hypothyroidism, unspecified: Secondary | ICD-10-CM | POA: Insufficient documentation

## 2012-03-26 DIAGNOSIS — E7800 Pure hypercholesterolemia, unspecified: Secondary | ICD-10-CM | POA: Insufficient documentation

## 2012-03-26 DIAGNOSIS — Z23 Encounter for immunization: Secondary | ICD-10-CM

## 2012-03-26 MED ORDER — TRIAMCINOLONE ACETONIDE(NASAL) 55 MCG/ACT NA INHA: 2.0000 | Freq: Every day | NASAL | Status: DC

## 2012-03-26 MED ORDER — LEVOTHYROXINE SODIUM 75 MCG PO TABS: 75.0000 ug | ORAL_TABLET | ORAL | Status: DC

## 2012-03-26 MED ORDER — LEVOTHYROXINE SODIUM 50 MCG PO TABS: 50.0000 ug | ORAL_TABLET | ORAL | Status: DC

## 2012-03-26 NOTE — Assessment & Plan Note (Signed)
On a low dose of Crestor and tolerating.  Check lipid panel and liver function.

## 2012-03-26 NOTE — Assessment & Plan Note (Signed)
On Levothyroxine.  Check tsh.    

## 2012-03-26 NOTE — Assessment & Plan Note (Signed)
Symptoms controlled on current regimen.  Has follow up with Dr Elsie Amis 04/10/12.  Planning for EGD in 11/13.

## 2012-03-26 NOTE — Patient Instructions (Signed)
It was nice seeing you today.  I have sent in your prescriptions to [facility].  We will go ahead and get your physical and mammogram scheduled.

## 2012-03-26 NOTE — Progress Notes (Signed)
  Subjective:    Patient ID: [name], female    DOB: July 07, 1945, 66 y.o.   MRN: [id number]  HPI 66 year old female with past history of hypothyroidism, hypercholesterolemia and GERD who comes in today for a scheduled follow up.  She states she is doing relatively well.  Some increased stress with her husbands health issues.  Feels she is handling things relatively well.  Reports some allergy symptoms, but this has improved with saline nasal flushes and Nasacort.  Just had her check up for her knee.  States everything checked out fine.  Does have some right hip bursitis.  Due to see Dr Elsie Amis 04/10/12.  Due for follow up EGD this year.  Planning to get this schedule in 11/13.    Past Medical History  Diagnosis Date  . Hypothyroidism   . Environmental allergies   . Hypercholesterolemia   . Arthritis   . GERD (gastroesophageal reflux disease)   . Esophagitis     Barrets, followed by Dr Elsie Amis    Review of Systems Patient denies any headache, lightheadedness or dizziness.  No chest pain, tightness or palpatations. No increased shortness of breath, cough or congestion.  Acid reflux controlled on current med regimen.  No dysphagia or odynophagia.  No nausea or vomiting.  No abdominal pain or cramping.  No bowel change, such as diarrhea, constipation, BRBPR or melana.  No urine change.        Objective:   Physical Exam Filed Vitals:   03/26/12 1024  BP: 100/70  Pulse: 56  Temp: 98.3 F (19.51 C)   66 year old female in no acute distress.   HEENT:  Nares - clear.  OP- without lesions or erythema.  NECK:  Supple, nontender.  No audible carotid bruit.   HEART:  Appears to be regular. LUNGS:  Without crackles or wheezing audible.  Respirations even and unlabored.   RADIAL PULSE:  Equal bilaterally.  ABDOMEN:  Soft, nontender.  No audible abdominal bruit.   EXTREMITIES:  No increased edema to be present.                     Assessment & Plan:  ALLERGIES.  Controlled with Nasacort  and saline nasal flushes.   MSK.  Knee is doing well.  Has right hip bursitis.  Doing better.  Follow.   HEALTH MAINTENANCE.  Will schedule a physical for next visit.  Last mammogram 08/22/11.  Up to date with colonoscopy.  Obtain records.  Flu shot given today.

## 2012-03-27 ENCOUNTER — Other Ambulatory Visit: Payer: Self-pay | Admitting: Internal Medicine

## 2012-03-28 ENCOUNTER — Other Ambulatory Visit: Payer: Self-pay | Admitting: *Deleted

## 2012-03-28 LAB — TSH: TSH: 2.67 u[IU]/mL (ref 0.450–4.500)

## 2012-03-28 LAB — LIPID PANEL W/O CHOL/HDL RATIO
Cholesterol, Total: 251 mg/dL — ABNORMAL HIGH (ref 100–199)
HDL: 67 mg/dL
LDL Calculated: 148 mg/dL — ABNORMAL HIGH (ref 0–99)
Triglycerides: 180 mg/dL — ABNORMAL HIGH (ref 0–149)
VLDL Cholesterol Cal: 36 mg/dL (ref 5–40)

## 2012-03-28 LAB — COMPREHENSIVE METABOLIC PANEL WITH GFR
ALT: 19 [IU]/L (ref 0–32)
AST: 22 [IU]/L (ref 0–40)
Albumin/Globulin Ratio: 1.8 (ref 1.1–2.5)
Albumin: 4.8 g/dL (ref 3.6–4.8)
Alkaline Phosphatase: 82 [IU]/L (ref 47–112)
BUN/Creatinine Ratio: 17 (ref 11–26)
BUN: 12 mg/dL (ref 8–27)
CO2: 25 mmol/L (ref 19–28)
Calcium: 10.2 mg/dL (ref 8.6–10.2)
Chloride: 101 mmol/L (ref 97–108)
Creatinine, Ser: 0.7 mg/dL (ref 0.57–1.00)
GFR calc Af Amer: 104 mL/min/{1.73_m2}
GFR calc non Af Amer: 90 mL/min/{1.73_m2}
Globulin, Total: 2.6 g/dL (ref 1.5–4.5)
Glucose: 87 mg/dL (ref 65–99)
Potassium: 4.5 mmol/L (ref 3.5–5.2)
Sodium: 142 mmol/L (ref 134–144)
Total Bilirubin: 0.4 mg/dL (ref 0.0–1.2)
Total Protein: 7.4 g/dL (ref 6.0–8.5)

## 2012-03-28 LAB — CBC WITH DIFFERENTIAL
Basophils Absolute: 0 10*3/uL (ref 0.0–0.2)
Basos: 0 % (ref 0–3)
Eos: 0 % (ref 0–5)
Eosinophils Absolute: 0 10*3/uL (ref 0.0–0.4)
HCT: 40.8 % (ref 34.0–46.6)
Hemoglobin: 13.8 g/dL (ref 11.1–15.9)
Immature Grans (Abs): 0 10*3/uL (ref 0.0–0.1)
Immature Granulocytes: 0 % (ref 0–2)
Lymphocytes Absolute: 1.6 10*3/uL (ref 0.7–3.1)
Lymphs: 28 % (ref 14–46)
MCH: 32.8 pg (ref 26.6–33.0)
MCHC: 33.8 g/dL (ref 31.5–35.7)
MCV: 97 fL (ref 79–97)
Monocytes Absolute: 0.5 10*3/uL (ref 0.1–0.9)
Monocytes: 8 % (ref 4–12)
Neutrophils Absolute: 3.7 10*3/uL (ref 1.4–7.0)
Neutrophils Relative %: 64 % (ref 40–74)
Platelets: 293 10*3/uL (ref 155–379)
RBC: 4.21 x10E6/uL (ref 3.77–5.28)
RDW: 12.8 % (ref 12.3–15.4)
WBC: 5.8 10*3/uL (ref 3.4–10.8)

## 2012-03-28 MED ORDER — ROSUVASTATIN CALCIUM 5 MG PO TABS: ORAL_TABLET | ORAL | Status: DC

## 2012-04-02 ENCOUNTER — Telehealth: Payer: Self-pay | Admitting: *Deleted

## 2012-04-02 NOTE — Telephone Encounter (Signed)
R'cd fax from [facility] for PA of Crestor ([phone] ref #: [id number]). [employer] company, awaiting to receive fax from [employer].

## 2012-04-02 NOTE — Telephone Encounter (Signed)
PA received, placed on MD's desk to complete.

## 2012-04-03 NOTE — Telephone Encounter (Signed)
Did not receive any form for prior authorization.  Not in here in my room.  Thanks.

## 2012-04-04 ENCOUNTER — Telehealth: Payer: Self-pay | Admitting: *Deleted

## 2012-04-04 NOTE — Telephone Encounter (Signed)
Prior authorization information was received by Dr.Scott and she handed it over to [place] today.

## 2012-04-04 NOTE — Telephone Encounter (Signed)
Called patient per her request to inform her that we did receive her prior authorization. Patient inquired to see if after she finishes her Crestor 5 mg if you are going to switch her to 10 mg?

## 2012-04-04 NOTE — Telephone Encounter (Signed)
Per previous lab review and orders - I had instructed her to increase her crestor to 10mg  on mon, wed and Friday and remain on 5mg  all other days.  She can go ahead and start.

## 2012-04-06 NOTE — Telephone Encounter (Signed)
Called patient to confirm that she had been called regarding her Crestor instructions... And she had.

## 2012-06-10 ENCOUNTER — Telehealth: Payer: Self-pay | Admitting: Internal Medicine

## 2012-06-10 NOTE — Telephone Encounter (Signed)
I reviewed her outside records.  She does need an appt scheduled for a physical.  Please schedule.  She is overdue.  Thanks.

## 2012-06-14 NOTE — Telephone Encounter (Signed)
Monday 08/13/12 @ 10:30  Pt aware of appointment

## 2012-06-25 ENCOUNTER — Telehealth: Payer: Self-pay | Admitting: *Deleted

## 2012-06-25 NOTE — Telephone Encounter (Signed)
Patient called concerning PA on her omeprazole script.

## 2012-08-13 ENCOUNTER — Ambulatory Visit (INDEPENDENT_AMBULATORY_CARE_PROVIDER_SITE_OTHER): Payer: Medicare Other | Admitting: Internal Medicine

## 2012-08-13 VITALS — BP 131/83 | HR 54 | Temp 98.5°F | Ht 63.0 in | Wt 149.0 lb

## 2012-08-13 DIAGNOSIS — K219 Gastro-esophageal reflux disease without esophagitis: Secondary | ICD-10-CM

## 2012-08-13 DIAGNOSIS — Z1239 Encounter for other screening for malignant neoplasm of breast: Secondary | ICD-10-CM

## 2012-08-13 DIAGNOSIS — R0602 Shortness of breath: Secondary | ICD-10-CM

## 2012-08-13 DIAGNOSIS — E7800 Pure hypercholesterolemia, unspecified: Secondary | ICD-10-CM

## 2012-08-13 DIAGNOSIS — E039 Hypothyroidism, unspecified: Secondary | ICD-10-CM

## 2012-08-13 LAB — COMPREHENSIVE METABOLIC PANEL WITH GFR
ALT: 19 U/L (ref 0–35)
AST: 23 U/L (ref 0–37)
Albumin: 4.4 g/dL (ref 3.5–5.2)
Alkaline Phosphatase: 68 U/L (ref 39–117)
BUN: 14 mg/dL (ref 6–23)
CO2: 28 meq/L (ref 19–32)
Calcium: 9.7 mg/dL (ref 8.4–10.5)
Chloride: 102 meq/L (ref 96–112)
Creatinine, Ser: 0.6 mg/dL (ref 0.4–1.2)
GFR: 112.3 mL/min
Glucose, Bld: 83 mg/dL (ref 70–99)
Potassium: 3.9 meq/L (ref 3.5–5.1)
Sodium: 140 meq/L (ref 135–145)
Total Bilirubin: 0.7 mg/dL (ref 0.3–1.2)
Total Protein: 7.4 g/dL (ref 6.0–8.3)

## 2012-08-13 LAB — LIPID PANEL
Cholesterol: 176 mg/dL (ref 0–200)
HDL: 68 mg/dL
LDL Cholesterol: 92 mg/dL (ref 0–99)
Total CHOL/HDL Ratio: 3
Triglycerides: 82 mg/dL (ref 0.0–149.0)
VLDL: 16.4 mg/dL (ref 0.0–40.0)

## 2012-08-13 LAB — TSH: TSH: 1.77 u[IU]/mL (ref 0.35–5.50)

## 2012-08-14 ENCOUNTER — Encounter: Payer: Self-pay | Admitting: Internal Medicine

## 2012-08-14 NOTE — Assessment & Plan Note (Signed)
On Levothyroxine.  Check tsh.

## 2012-08-14 NOTE — Assessment & Plan Note (Signed)
On a low dose of Crestor and tolerating.  Increased dose slightly after last lab check.  Check lipid panel and liver function.

## 2012-08-14 NOTE — Progress Notes (Addendum)
Subjective:    Patient ID: [name], female    DOB: 1945-07-27, 67 y.o.   MRN: [id number]  HPI 67 year old female with past history of hypothyroidism, hypercholesterolemia and GERD who comes in today to follow up on these issues as well as for a complete physical exam.  She states she is doing relatively well.  Has not been exercising regularly.  States she started walking recently and walked yesterday.  Noticed when she was walking up a hill, some increased windedness/sob.  No chest pain.  Eating and drinking well.  No nausea or vomiting.  No acid reflux.  No bowel change.  Still seeing Dr Elsie Amis.  States had EGD and colonoscopy recently.  Need to obtain results.  Knee is stable.    Past Medical History  Diagnosis Date  . Hypothyroidism   . Environmental allergies   . Hypercholesterolemia   . Arthritis   . GERD (gastroesophageal reflux disease)   . Esophagitis     Barrets, followed by Dr Elsie Amis    Current Outpatient Prescriptions on File Prior to Visit  Medication Sig Dispense Refill  . acetaminophen (TYLENOL) 500 MG tablet Take 500 mg by mouth every 6 (six) hours as needed.      . celecoxib (CELEBREX) 200 MG capsule Take 200 mg by mouth daily as needed.      . cholecalciferol (VITAMIN D) 1000 UNITS tablet Take 1,000 Units by mouth daily.      . Coenzyme Q10 60 MG TABS Take 120 mg by mouth daily.      . Lactobacillus (PROBIOTIC ACIDOPHILUS PO) Take 1,360 mg by mouth daily.      Marland Kitchen levothyroxine (LEVOTHROID) 75 MCG tablet Take 1 tablet (75 mcg total) by mouth every other day.  90 tablet  3  . levothyroxine (SYNTHROID, LEVOTHROID) 50 MCG tablet Take 1 tablet (50 mcg total) by mouth every other day.  90 tablet  3  . MILK THISTLE PO Take by mouth daily.      . Multiple Vitamin (MULTIVITAMIN) LIQD Take 5 mLs by mouth daily.      . Omega-3 Fatty Acids (FISH OIL) 1360 MG CAPS Take by mouth daily.      . rosuvastatin (CRESTOR) 5 MG tablet Take 2 tablets (10mg ) by mouth on Mon, Wed and  Friday and continue 1 tablet by mouth (5mg ) on all other days  120 tablet  3  . triamcinolone (NASACORT AQ) 55 MCG/ACT nasal inhaler Place 2 sprays into the nose daily as needed.      . triamcinolone (NASACORT) 55 MCG/ACT nasal inhaler Place 2 sprays into the nose daily.  3 Inhaler  3  . Calcium-Vitamin D-Vitamin K (CALCIUM SOFT CHEWS PO) Take 500 mg by mouth daily.      Marland Kitchen omeprazole (PRILOSEC) 20 MG capsule Take 20 mg by mouth daily.       No current facility-administered medications on file prior to visit.    Review of Systems Patient denies any headache, lightheadedness or dizziness.  No significant sinus or allergy symptoms currently.  Uses Flonase.  No chest pain, tightness or palpitations. No increased shortness of breath, cough or congestion.  Acid reflux controlled on current med regimen.  No dysphagia or odynophagia.  No nausea or vomiting.  No abdominal pain or cramping.  No bowel change, such as diarrhea, constipation, BRBPR or melana.  No urine change.        Objective:   Physical Exam  Filed Vitals:   08/13/12  1046  BP: 131/83  Pulse: 54  Temp: 98.5 F (60.65 C)   67 year old female in no acute distress.   HEENT:  Nares- clear.  Oropharynx - without lesions. NECK:  Supple.  Nontender.  No audible bruit.  HEART:  Appears to be regular. LUNGS:  No crackles or wheezing audible.  Respirations even and unlabored.  RADIAL PULSE:  Equal bilaterally.    BREASTS:  No nipple discharge or nipple retraction present.  Could not appreciate any distinct nodules or axillary adenopathy.  ABDOMEN:  Soft, nontender.  Bowel sounds present and normal.  No audible abdominal bruit.  GU:  Normal external genitalia.  Vaginal vault without lesions.  S/p hysterectomy.  Could not appreciate any adnexal masses or tenderness.   RECTAL:  Heme negative.   EXTREMITIES:  No increased edema present.  DP pulses palpable and equal bilaterally.            Assessment & Plan:  ALLERGIES.  Controlled with  nasacort and saline nasal flushes.   MSK.  Knee is doing well.  Follow.   CARDIOVASCULAR.  Described the windedness with increased exertion (up an incline).  EKG obtained and revealed SR with nonspecific ST/T changes.  Discussed further cardiac w/up with her (ie stress testing).  She wants to hold on any further testing at this time.  Wants to monitor symptoms.  If any change or problems, she will notify me or be reevaluated.    HEALTH MAINTENANCE.  Physical today.  Is s/p hysterectomy and does not require yearly pap smears.   Last mammogram 08/22/11.  Schedule a follow up mammogram when due.  Up to date with colonoscopy.  Obtain records.  Last I have documented is 2004.  States has had a colonoscopy in the last couple of years.

## 2012-08-14 NOTE — Assessment & Plan Note (Signed)
Symptoms controlled on current regimen.  Had follow up with Dr Elsie Amis 04/10/12.  Had follow up EGD.  Obtain results.

## 2012-10-06 ENCOUNTER — Encounter: Payer: Self-pay | Admitting: Internal Medicine

## 2012-10-06 DIAGNOSIS — Z860100 Personal history of colon polyps, unspecified: Secondary | ICD-10-CM

## 2012-10-29 ENCOUNTER — Encounter: Payer: Self-pay | Admitting: Internal Medicine

## 2013-01-09 ENCOUNTER — Other Ambulatory Visit: Payer: Self-pay | Admitting: *Deleted

## 2013-01-09 MED ORDER — CELECOXIB 200 MG PO CAPS: 200.0000 mg | ORAL_CAPSULE | Freq: Every day | ORAL | Status: DC | PRN

## 2013-01-09 MED ORDER — OMEPRAZOLE 20 MG PO CPDR: 20.0000 mg | DELAYED_RELEASE_CAPSULE | Freq: Every day | ORAL | Status: DC

## 2013-01-09 NOTE — Telephone Encounter (Signed)
Okay to refill? 

## 2013-01-09 NOTE — Telephone Encounter (Signed)
Refilled celebrex #30 with one refill

## 2013-02-25 ENCOUNTER — Encounter: Payer: Self-pay | Admitting: Internal Medicine

## 2013-02-25 ENCOUNTER — Ambulatory Visit (INDEPENDENT_AMBULATORY_CARE_PROVIDER_SITE_OTHER): Payer: Medicare Other | Admitting: Internal Medicine

## 2013-02-25 VITALS — BP 110/60 | HR 62 | Temp 98.2°F | Ht 63.0 in | Wt 148.8 lb

## 2013-02-25 DIAGNOSIS — E7800 Pure hypercholesterolemia, unspecified: Secondary | ICD-10-CM

## 2013-02-25 DIAGNOSIS — F439 Reaction to severe stress, unspecified: Secondary | ICD-10-CM

## 2013-02-25 DIAGNOSIS — Z733 Stress, not elsewhere classified: Secondary | ICD-10-CM

## 2013-02-25 DIAGNOSIS — K219 Gastro-esophageal reflux disease without esophagitis: Secondary | ICD-10-CM

## 2013-02-25 DIAGNOSIS — Z23 Encounter for immunization: Secondary | ICD-10-CM

## 2013-02-25 DIAGNOSIS — E039 Hypothyroidism, unspecified: Secondary | ICD-10-CM

## 2013-02-25 MED ORDER — SERTRALINE HCL 50 MG PO TABS: 50.0000 mg | ORAL_TABLET | Freq: Every day | ORAL | Status: DC

## 2013-02-25 NOTE — Patient Instructions (Signed)
Vitamin E (200 IU twice a day) or 400 IU once a day

## 2013-02-26 ENCOUNTER — Telehealth: Payer: Self-pay | Admitting: *Deleted

## 2013-02-26 DIAGNOSIS — E039 Hypothyroidism, unspecified: Secondary | ICD-10-CM

## 2013-02-26 DIAGNOSIS — E7800 Pure hypercholesterolemia, unspecified: Secondary | ICD-10-CM

## 2013-02-26 NOTE — Telephone Encounter (Signed)
Labs ordered.

## 2013-02-26 NOTE — Telephone Encounter (Signed)
Pt is coming in for labs tomorrow what labs and dx?  

## 2013-02-27 ENCOUNTER — Encounter: Payer: Self-pay | Admitting: Internal Medicine

## 2013-02-27 ENCOUNTER — Other Ambulatory Visit (INDEPENDENT_AMBULATORY_CARE_PROVIDER_SITE_OTHER): Payer: Medicare Other

## 2013-02-27 DIAGNOSIS — F439 Reaction to severe stress, unspecified: Secondary | ICD-10-CM | POA: Insufficient documentation

## 2013-02-27 DIAGNOSIS — E7800 Pure hypercholesterolemia, unspecified: Secondary | ICD-10-CM

## 2013-02-27 DIAGNOSIS — E039 Hypothyroidism, unspecified: Secondary | ICD-10-CM

## 2013-02-27 LAB — LIPID PANEL
Cholesterol: 210 mg/dL — ABNORMAL HIGH (ref 0–200)
HDL: 67.1 mg/dL
Total CHOL/HDL Ratio: 3
Triglycerides: 111 mg/dL (ref 0.0–149.0)
VLDL: 22.2 mg/dL (ref 0.0–40.0)

## 2013-02-27 LAB — COMPREHENSIVE METABOLIC PANEL WITH GFR
ALT: 22 U/L (ref 0–35)
AST: 24 U/L (ref 0–37)
Albumin: 4.4 g/dL (ref 3.5–5.2)
Alkaline Phosphatase: 70 U/L (ref 39–117)
BUN: 16 mg/dL (ref 6–23)
CO2: 29 meq/L (ref 19–32)
Calcium: 9.7 mg/dL (ref 8.4–10.5)
Chloride: 100 meq/L (ref 96–112)
Creatinine, Ser: 0.6 mg/dL (ref 0.4–1.2)
GFR: 103.68 mL/min
Glucose, Bld: 85 mg/dL (ref 70–99)
Potassium: 4.5 meq/L (ref 3.5–5.1)
Sodium: 136 meq/L (ref 135–145)
Total Bilirubin: 0.5 mg/dL (ref 0.3–1.2)
Total Protein: 6.8 g/dL (ref 6.0–8.3)

## 2013-02-27 LAB — TSH: TSH: 0.83 u[IU]/mL (ref 0.35–5.50)

## 2013-02-27 LAB — LDL CHOLESTEROL, DIRECT: Direct LDL: 123.4 mg/dL

## 2013-02-27 NOTE — Assessment & Plan Note (Signed)
On a low dose of Crestor and tolerating.  Follow lipid panel and liver function.   

## 2013-02-27 NOTE — Progress Notes (Signed)
Subjective:    Patient ID: [name], female    DOB: Mar 02, 1946, 67 y.o.   MRN: [id number]  HPI 67 year old female with past history of hypothyroidism, hypercholesterolemia and GERD who comes in today for a scheduled follow up.  She states she is doing relatively well.  Increased stress.  Building a house.  Feels she is handling things relatively well.  Some increased stress and anxiety.  Feels she may need something just to help keep things level.  No chest pain.  Eating and drinking well.  No nausea or vomiting.  No acid reflux.  No bowel change.  Still seeing Dr Elsie Amis.  Taking crestor.  Toleratng.  She does report that she had an episode of dizziness back in June.  No reoccurrence.   Past Medical History  Diagnosis Date  . Hypothyroidism   . Environmental allergies   . Hypercholesterolemia   . Arthritis   . GERD (gastroesophageal reflux disease)   . Esophagitis     [place]rets, followed by Dr Elsie Amis    Current Outpatient Prescriptions on File Prior to Visit  Medication Sig Dispense Refill  . acetaminophen (TYLENOL) 500 MG tablet Take 500 mg by mouth every 6 (six) hours as needed.      . celecoxib (CELEBREX) 200 MG capsule Take 1 capsule (200 mg total) by mouth daily as needed.  30 capsule  1  . cholecalciferol (VITAMIN D) 1000 UNITS tablet Take 1,000 Units by mouth daily.      . Coenzyme Q10 60 MG TABS Take 120 mg by mouth daily.      . Lactobacillus (PROBIOTIC ACIDOPHILUS PO) Take 1,360 mg by mouth daily.      Marland Kitchen levothyroxine (LEVOTHROID) 75 MCG tablet Take 1 tablet (75 mcg total) by mouth every other day.  90 tablet  3  . levothyroxine (SYNTHROID, LEVOTHROID) 50 MCG tablet Take 1 tablet (50 mcg total) by mouth every other day.  90 tablet  3  . Multiple Vitamin (MULTIVITAMIN) LIQD Take 5 mLs by mouth daily.      . Omega-3 Fatty Acids (FISH OIL) 1360 MG CAPS Take by mouth daily.      Marland Kitchen omeprazole (PRILOSEC) 20 MG capsule Take 1 capsule (20 mg total) by mouth daily.  90 capsule   1  . rosuvastatin (CRESTOR) 5 MG tablet Take 2 tablets (10mg ) by mouth on Mon, Wed and Friday and continue 1 tablet by mouth (5mg ) on all other days  120 tablet  3  . triamcinolone (NASACORT) 55 MCG/ACT nasal inhaler Place 2 sprays into the nose daily.  3 Inhaler  3   No current facility-administered medications on file prior to visit.    Review of Systems Patient denies any headache, lightheadedness or dizziness.  No significant sinus or allergy symptoms currently.  Uses steroid nasal spray.  No chest pain, tightness or palpitations. No increased shortness of breath, cough or congestion.  Acid reflux controlled on current med regimen.  No dysphagia or odynophagia.  No nausea or vomiting.  No abdominal pain or cramping.  No bowel change, such as diarrhea, constipation, BRBPR or melana.  No urine change.  Increased stress and anxiety as outlined.        Objective:   Physical Exam  Filed Vitals:   02/25/13 1058  BP: 110/60  Pulse: 62  Temp: 98.2 F (36.8 C)   Blood pressure recheck:  61/8  67 year old female in no acute distress.   HEENT:  Nares- clear.  Oropharynx - without lesions. NECK:  Supple.  Nontender.  No audible bruit.  HEART:  Appears to be regular. LUNGS:  No crackles or wheezing audible.  Respirations even and unlabored.  RADIAL PULSE:  Equal bilaterally.   ABDOMEN:  Soft, nontender.  Bowel sounds present and normal.  No audible abdominal bruit.   EXTREMITIES:  No increased edema present.  DP pulses palpable and equal bilaterally.            Assessment & Plan:  PREVIOUS DIZZINESS.  Desires no further w/up at this time.  Wants to monitor.  Has not had reoccurrence.  Follow.   ALLERGIES.  Controlled with nasacort and saline nasal flushes.   MSK.  Knee is doing well.  Follow.   CARDIOVASCULAR.  Currently asymptomatic.  Follow.   HEALTH MAINTENANCE.  Physical 08/13/12.   Is s/p hysterectomy and does not require yearly pap smears.   Mammogram 10/01/12 - Birads II. Marland Kitchen  Up  to date with colonoscopy.

## 2013-02-27 NOTE — Assessment & Plan Note (Signed)
Symptoms controlled on current regimen.  Had follow up with Dr Nat Mann 04/10/12.  Had follow up EGD.    

## 2013-02-27 NOTE — Assessment & Plan Note (Signed)
Increased stress and some anxiety.  Feels she needs something to help regulate things.  Will start zoloft as directed.  Follow.  Get her back in soon to reassess.

## 2013-02-27 NOTE — Assessment & Plan Note (Signed)
On Levothyroxine.  Check tsh.

## 2013-02-28 ENCOUNTER — Encounter: Payer: Self-pay | Admitting: *Deleted

## 2013-04-04 ENCOUNTER — Telehealth: Payer: Self-pay | Admitting: *Deleted

## 2013-04-04 ENCOUNTER — Other Ambulatory Visit: Payer: Self-pay | Admitting: *Deleted

## 2013-04-04 MED ORDER — TRIAMCINOLONE ACETONIDE 55 MCG/ACT NA AERO: 2.0000 | INHALATION_SPRAY | Freq: Every day | NASAL | Status: DC

## 2013-04-04 NOTE — Telephone Encounter (Signed)
done

## 2013-04-04 NOTE — Telephone Encounter (Signed)
Refill Request  Triamcinolon SPR 55 mcg/ac  Aerosol   Use 2 sprays nasally daily

## 2013-04-10 ENCOUNTER — Telehealth: Payer: Self-pay | Admitting: Internal Medicine

## 2013-04-10 ENCOUNTER — Other Ambulatory Visit: Payer: Self-pay | Admitting: Internal Medicine

## 2013-04-10 DIAGNOSIS — E7800 Pure hypercholesterolemia, unspecified: Secondary | ICD-10-CM

## 2013-04-10 NOTE — Telephone Encounter (Signed)
Is this the right order 

## 2013-04-10 NOTE — Telephone Encounter (Signed)
The patient is wanting to be referred to Dr. Mariah Milling to have an ultra sound of her Carotid Artery.

## 2013-04-10 NOTE — Telephone Encounter (Signed)
Please change order to the [facility] Carotid U/S instead of the [facility] Carotid U/S. They went ahead and scheduled it without the correct order. Pt to go in on 04/12/13

## 2013-04-10 NOTE — Telephone Encounter (Signed)
I would assume its covered just like it would be if performed at [facility]. I will talk with the pt

## 2013-04-10 NOTE — Telephone Encounter (Signed)
See referral message for explanation.  Pt wants carotid US at Dr Windell Hummingbird office.  Will insurance cover this.

## 2013-04-10 NOTE — Progress Notes (Signed)
Order placed for carotid US

## 2013-04-10 NOTE — Progress Notes (Signed)
Order placed for carotid us 

## 2013-04-10 NOTE — Telephone Encounter (Signed)
Noted.  Thanks.  I did not know because the only dx I have is hypercholesterolemia.  Thanks.

## 2013-04-11 NOTE — Telephone Encounter (Signed)
Yes ma am. Thanks.

## 2013-04-12 ENCOUNTER — Encounter (INDEPENDENT_AMBULATORY_CARE_PROVIDER_SITE_OTHER): Payer: Medicare Other

## 2013-04-12 DIAGNOSIS — R42 Dizziness and giddiness: Secondary | ICD-10-CM

## 2013-04-22 ENCOUNTER — Ambulatory Visit: Payer: Medicare Other | Admitting: Internal Medicine

## 2013-05-06 LAB — HM COLONOSCOPY

## 2013-05-07 DIAGNOSIS — Z860100 Personal history of colon polyps, unspecified: Secondary | ICD-10-CM | POA: Insufficient documentation

## 2013-05-13 ENCOUNTER — Encounter: Payer: Self-pay | Admitting: Internal Medicine

## 2013-05-13 DIAGNOSIS — Z860100 Personal history of colon polyps, unspecified: Secondary | ICD-10-CM

## 2013-05-24 ENCOUNTER — Other Ambulatory Visit: Payer: Self-pay | Admitting: *Deleted

## 2013-05-25 MED ORDER — LEVOTHYROXINE SODIUM 50 MCG PO TABS: 50.0000 ug | ORAL_TABLET | ORAL | Status: DC

## 2013-05-25 MED ORDER — LEVOTHYROXINE SODIUM 75 MCG PO TABS: 75.0000 ug | ORAL_TABLET | ORAL | Status: DC

## 2013-06-12 ENCOUNTER — Telehealth: Payer: Self-pay | Admitting: *Deleted

## 2013-06-12 NOTE — Telephone Encounter (Signed)
Spoke to pt.  She wants to hold on changing medicatons.

## 2013-06-12 NOTE — Telephone Encounter (Signed)
Pt called requesting a generic for Crestor & Celebrex. The generic substitutions requested are: Lovastatin 5mg  & Diclofenac. Would like them both sent to mail order

## 2013-06-27 ENCOUNTER — Telehealth: Payer: Self-pay | Admitting: Internal Medicine

## 2013-06-27 ENCOUNTER — Other Ambulatory Visit: Payer: Self-pay | Admitting: *Deleted

## 2013-06-27 MED ORDER — ACYCLOVIR 800 MG PO TABS: 800.0000 mg | ORAL_TABLET | Freq: Two times a day (BID) | ORAL | Status: DC

## 2013-06-27 MED ORDER — CELECOXIB 200 MG PO CAPS: 200.0000 mg | ORAL_CAPSULE | Freq: Every day | ORAL | Status: DC | PRN

## 2013-06-27 NOTE — Telephone Encounter (Signed)
Okay to refill Acyclovir & Celebrex?  Please advise

## 2013-06-27 NOTE — Telephone Encounter (Signed)
Ok to refill acyclovir and celebrex.  If do mail order, then #90 celebrex with one refill

## 2013-06-27 NOTE — Telephone Encounter (Signed)
Rxs sent electronically.  

## 2013-06-27 NOTE — Telephone Encounter (Signed)
Came into office to for refills.  Says she has new insurance, [employer], and will now need [facility].  States they have moved to [place] and would like to use [facility].    Acyclovir 800 mg tablet refilled now - send to [facility].  For future all will need to go to [facility] mail order.   Celebrex or generic - needs mail order refill.  Generic preferred if it is something that will not hurt her stomach.

## 2013-06-27 NOTE — Telephone Encounter (Signed)
Pt.notified

## 2013-07-04 ENCOUNTER — Telehealth: Payer: Self-pay | Admitting: Internal Medicine

## 2013-07-04 NOTE — Telephone Encounter (Signed)
Pt came in today stating her celbrex would cost her $475 for 90 day supply  She wanted to know if she could take meloxicam generic for mobic   [facility] with [employer] for 90 day supply Pt has 10 celbrex left Please advise pt

## 2013-07-04 NOTE — Telephone Encounter (Signed)
Please advise 

## 2013-07-05 ENCOUNTER — Other Ambulatory Visit: Payer: Self-pay | Admitting: *Deleted

## 2013-07-05 MED ORDER — MELOXICAM 7.5 MG PO TABS: 7.5000 mg | ORAL_TABLET | Freq: Every day | ORAL | Status: DC

## 2013-07-05 NOTE — Telephone Encounter (Signed)
Rx sent to [facility] & pt notified

## 2013-07-05 NOTE — Telephone Encounter (Signed)
I am ok if she changes celebrex to Mobic.  Can take mobic 7.5mg  q day prn #90 with one refill.

## 2013-07-26 ENCOUNTER — Telehealth: Payer: Self-pay | Admitting: *Deleted

## 2013-07-26 NOTE — Telephone Encounter (Signed)
Pt walked in at 4:45 C/O chest cold since [facility] today. Started with sore throat & sinus symptoms. Now mainly in chest & chest feels tight. Has a productive cough. I advised patient that this late in the afternoon, she should go to [facility] walk-in, Saturday clinic, or Acute Care. Pt states that [facility] her to try some medication OTC. She will try that first. I encouraged patient to call us on Monday (early) if she is not better & would like to be worked in somewhere. Pt verbalized understanding

## 2013-07-29 ENCOUNTER — Ambulatory Visit (INDEPENDENT_AMBULATORY_CARE_PROVIDER_SITE_OTHER): Payer: Medicare HMO | Admitting: Internal Medicine

## 2013-07-29 ENCOUNTER — Encounter: Payer: Self-pay | Admitting: Internal Medicine

## 2013-07-29 VITALS — BP 120/80 | HR 65 | Temp 98.0°F | Ht 63.0 in | Wt 149.0 lb

## 2013-07-29 DIAGNOSIS — J069 Acute upper respiratory infection, unspecified: Secondary | ICD-10-CM

## 2013-07-29 MED ORDER — ALBUTEROL SULFATE HFA 108 (90 BASE) MCG/ACT IN AERS: 2.0000 | INHALATION_SPRAY | Freq: Four times a day (QID) | RESPIRATORY_TRACT | Status: DC | PRN

## 2013-07-29 MED ORDER — AMOXICILLIN 875 MG PO TABS: 875.0000 mg | ORAL_TABLET | Freq: Two times a day (BID) | ORAL | Status: DC

## 2013-07-29 NOTE — Patient Instructions (Signed)
Saline nasal spray - flush nose at least 2-3x/day.   Flonase nasal spray - 2 sprays each nostril one time per day.  Do this in the evening.   Mucinex DM in the am and Robitussin DM in the evening

## 2013-07-30 ENCOUNTER — Encounter: Payer: Self-pay | Admitting: Internal Medicine

## 2013-07-30 DIAGNOSIS — J069 Acute upper respiratory infection, unspecified: Secondary | ICD-10-CM | POA: Insufficient documentation

## 2013-07-30 NOTE — Progress Notes (Signed)
Subjective:    Patient ID: [name], female    DOB: 04/27/1946, 68 y.o.   MRN: [id number]  Cough Associated symptoms include headaches.  Headache  Associated symptoms include coughing.  68 year old female with past history of hypothyroidism, hypercholesterolemia and GERD who comes in today as a work in with concerns regarding increased sinus pressure, nasal congestion, cough and chest congestion.  She states that symptoms started one week ago.  Started with sore throat - this is better.  With increased sinus pressure and nasal congestion.  Nose - productive of colored dark green mucus.  Increased chest congestion and productive cough.  Started saline nasal spray yesterday.  Started mucinex.  No vomiting.  Staying hydrated.  Eating.     Past Medical History  Diagnosis Date  . Hypothyroidism   . Environmental allergies   . Hypercholesterolemia   . Arthritis   . GERD (gastroesophageal reflux disease)   . Esophagitis     Barrets, followed by Dr Vennie Homans    Current Outpatient Prescriptions on File Prior to Visit  Medication Sig Dispense Refill  . acetaminophen (TYLENOL) 500 MG tablet Take 500 mg by mouth every 6 (six) hours as needed.      . cholecalciferol (VITAMIN D) 1000 UNITS tablet Take 1,000 Units by mouth daily.      . Coenzyme Q10 60 MG TABS Take 120 mg by mouth daily.      . Lactobacillus (PROBIOTIC ACIDOPHILUS PO) Take 1,360 mg by mouth daily.      Marland Kitchen levothyroxine (LEVOTHROID) 75 MCG tablet Take 1 tablet (75 mcg total) by mouth every other day.  90 tablet  2  . levothyroxine (SYNTHROID, LEVOTHROID) 50 MCG tablet Take 1 tablet (50 mcg total) by mouth every other day.  90 tablet  2  . meloxicam (MOBIC) 7.5 MG tablet Take 1 tablet (7.5 mg total) by mouth daily.  90 tablet  1  . Multiple Vitamin (MULTIVITAMIN) LIQD Take 5 mLs by mouth daily.      . Omega-3 Fatty Acids (FISH OIL) 1360 MG CAPS Take by mouth daily.      Marland Kitchen omeprazole (PRILOSEC) 20 MG capsule Take 1 capsule (20 mg  total) by mouth daily.  90 capsule  1  . rosuvastatin (CRESTOR) 5 MG tablet Take 2 tablets (10mg ) by mouth on Mon, Wed and Friday and continue 1 tablet by mouth (5mg ) on all other days  120 tablet  3  . triamcinolone (NASACORT) 55 MCG/ACT AERO nasal inhaler Place 2 sprays into the nose daily.  1 Inhaler  5   No current facility-administered medications on file prior to visit.    Review of Systems  Respiratory: Positive for cough.   Neurological: Positive for headaches.  Patient denies any headache, lightheadedness or dizziness.  Increased sinus congestion and pressure.  No chest pain, tightness or palpitations.  Increased cough and congestion.  Productive cough.  Colored mucus.  No sob.  Acid reflux controlled on current med regimen.  No nausea or vomiting.  No abdominal pain or cramping.  No bowel change, such as diarrhea.          Objective:   Physical Exam  Filed Vitals:   07/29/13 1421  BP: 120/80  Pulse: 65  Temp: 98 F (69.63 C)   68 year old female in no acute distress.   HEENT:  Nares- slightly erythematous turbinates.  Oropharynx - without lesions.  TMs visualized without erythema.  Minimal tenderness to palpation over the maxillary sinus.  NECK:  Supple.  Nontender. HEART:  Appears to be regular. LUNGS:  No crackles or wheezing audible.  Increased congestion clears mostly with coughing.  Increased cough with forced expiration.           Assessment & Plan:  HEALTH MAINTENANCE.  Physical 08/13/12.   Is s/p hysterectomy and does not require yearly pap smears.   Mammogram 10/01/12 - Birads II. Marland Kitchen  Up to date with colonoscopy.

## 2013-07-30 NOTE — Assessment & Plan Note (Signed)
Treat with amoxicillin as directed.  nasacort nasal spray and saline nasal spray as directed.  Mucinex DM in the am and robitussin DM in the evening.  Albuterol inhaler as directed.  Rest.  Fluids.  Explained to her if symptoms changed, worsened or did not resolve, she was to be reevaluated.

## 2013-08-01 NOTE — Telephone Encounter (Signed)
Late entry-pt was seen on 07/29/13

## 2013-08-05 ENCOUNTER — Telehealth: Payer: Self-pay | Admitting: *Deleted

## 2013-08-05 NOTE — Telephone Encounter (Signed)
Pt calls to report that she feels better but still has a lot of chest congestions, coughs up some mucous but not a lot. Still taking the Mucinex and will finish the abx on Wednesday. Wants to knows if she needs to be seen or if she needs some her abx extended? Pt states that her sx's are now going on two weeks now.

## 2013-08-05 NOTE — Telephone Encounter (Signed)
Pt notified. appt scheduled.

## 2013-08-05 NOTE — Telephone Encounter (Signed)
I can see her 2:15 on Wednesday 08/07/13 for f/u to see what further treatment needed.

## 2013-08-07 ENCOUNTER — Encounter: Payer: Self-pay | Admitting: Internal Medicine

## 2013-08-07 ENCOUNTER — Ambulatory Visit (INDEPENDENT_AMBULATORY_CARE_PROVIDER_SITE_OTHER): Payer: Medicare HMO | Admitting: Internal Medicine

## 2013-08-07 VITALS — BP 122/78 | HR 55 | Temp 97.8°F | Ht 63.0 in | Wt 150.5 lb

## 2013-08-07 DIAGNOSIS — J069 Acute upper respiratory infection, unspecified: Secondary | ICD-10-CM

## 2013-08-07 MED ORDER — ALBUTEROL SULFATE HFA 108 (90 BASE) MCG/ACT IN AERS: 2.0000 | INHALATION_SPRAY | Freq: Four times a day (QID) | RESPIRATORY_TRACT | Status: DC | PRN

## 2013-08-07 MED ORDER — LEVOTHYROXINE SODIUM 50 MCG PO TABS: 50.0000 ug | ORAL_TABLET | ORAL | Status: DC

## 2013-08-07 MED ORDER — LEVOTHYROXINE SODIUM 75 MCG PO TABS: 75.0000 ug | ORAL_TABLET | ORAL | Status: DC

## 2013-08-07 MED ORDER — AZITHROMYCIN 250 MG PO TABS: ORAL_TABLET | ORAL | Status: DC

## 2013-08-07 MED ORDER — ROSUVASTATIN CALCIUM 5 MG PO TABS: ORAL_TABLET | ORAL | Status: DC

## 2013-08-07 NOTE — Progress Notes (Signed)
Subjective:    Patient ID: [name], female    DOB: [birth year], 68 y.o.   MRN: [id number]  Cough Associated symptoms include headaches.  Headache  Associated symptoms include coughing.  68 year old female with past history of hypothyroidism, hypercholesterolemia and GERD who comes in today as a work in for continued chest congestion and cough.  She was seen recently and diagnosed with sinusitis/uri.  Treated with amoxicillin.  Head congestion and sinus symptoms are better.  She is still having increased chest congestion and cough.  Taking mucinex.  Cough mostly non productive.  No vomiting.  Staying hydrated.  Eating.  No vomiting.  Minimal diarrhea.    Past Medical History  Diagnosis Date  . Hypothyroidism   . Environmental allergies   . Hypercholesterolemia   . Arthritis   . GERD (gastroesophageal reflux disease)   . Esophagitis     Barrets, followed by Dr Vennie Homans    Current Outpatient Prescriptions on File Prior to Visit  Medication Sig Dispense Refill  . acetaminophen (TYLENOL) 500 MG tablet Take 500 mg by mouth every 6 (six) hours as needed.      Marland Kitchen albuterol (PROVENTIL HFA;VENTOLIN HFA) 108 (90 BASE) MCG/ACT inhaler Inhale 2 puffs into the lungs every 6 (six) hours as needed for wheezing or shortness of breath.  1 Inhaler  0  . amoxicillin (AMOXIL) 875 MG tablet Take 1 tablet (875 mg total) by mouth 2 (two) times daily.  20 tablet  0  . cholecalciferol (VITAMIN D) 1000 UNITS tablet Take 1,000 Units by mouth daily.      . Coenzyme Q10 60 MG TABS Take 120 mg by mouth daily.      . Hypertonic Nasal Wash (SINUS RINSE NA) Place into the nose as needed.      . Lactobacillus (PROBIOTIC ACIDOPHILUS PO) Take 1,360 mg by mouth daily.      . meloxicam (MOBIC) 7.5 MG tablet Take 1 tablet (7.5 mg total) by mouth daily.  90 tablet  1  . Multiple Vitamin (MULTIVITAMIN) LIQD Take 5 mLs by mouth daily.      . Omega-3 Fatty Acids (FISH OIL) 1360 MG CAPS Take by mouth daily.      Marland Kitchen  omeprazole (PRILOSEC) 20 MG capsule Take 1 capsule (20 mg total) by mouth daily.  90 capsule  1  . rosuvastatin (CRESTOR) 5 MG tablet Take 2 tablets (10mg ) by mouth on Mon, Wed and Friday and continue 1 tablet by mouth (5mg ) on all other days  120 tablet  3  . triamcinolone (NASACORT) 55 MCG/ACT AERO nasal inhaler Place 2 sprays into the nose daily.  1 Inhaler  5   No current facility-administered medications on file prior to visit.    Review of Systems  Respiratory: Positive for cough.   Neurological: Positive for headaches.  Patient denies any headache, lightheadedness or dizziness.  Headache has resolved now.  Nasal congestion and sinus symptoms have improved.  No chest pain, tightness or palpitations.  Still with increased cough and congestion.  Cough mostly non productive.   No sob.  Acid reflux controlled on current med regimen.  No nausea or vomiting.  No abdominal pain or cramping.  Minimal diarrhea.          Objective:   Physical Exam  Filed Vitals:   08/07/13 1428  BP: 122/78  Pulse: 55  Temp: 97.8 F (68.68 C)   68 year old female in no acute distress.   HEENT:  Nares- slightly  erythematous turbinates.  Oropharynx - without lesions.  TMs visualized without erythema.  No tenderness to palpation over the maxillary sinus.  NECK:  Supple.  Nontender. HEART:  Appears to be regular. LUNGS:  No crackles or wheezing audible.  Minimal increased cough with forced expiration.  Good breath sounds bilaterally.          Assessment & Plan:  HEALTH MAINTENANCE.  Physical 08/13/12.   Is s/p hysterectomy and does not require yearly pap smears.   Mammogram 10/01/12 - Birads II. Marland Kitchen  Up to date with colonoscopy.

## 2013-08-08 ENCOUNTER — Encounter: Payer: Self-pay | Admitting: Internal Medicine

## 2013-08-08 NOTE — Assessment & Plan Note (Signed)
Finishes her amoxicillin today.  Continue nasacort nasal spray and saline nasal spray as directed.  Mucinex DM in the am and robitussin DM in the evening.  Zpak as directed. Sinus better.   Albuterol inhaler as directed.  Rest.  Fluids.  Explained to her if symptoms changed, worsened or did not resolve, she was to be reevaluated.

## 2013-08-12 ENCOUNTER — Telehealth: Payer: Self-pay | Admitting: *Deleted

## 2013-08-12 NOTE — Telephone Encounter (Signed)
PA initiated through [employer] for Crestor 5mg 

## 2013-08-14 NOTE — Telephone Encounter (Signed)
Crestor auth approved until 08/14/14. Approval faxed to pharmacy.

## 2013-08-21 ENCOUNTER — Telehealth: Payer: Self-pay | Admitting: Emergency Medicine

## 2013-08-21 NOTE — Telephone Encounter (Signed)
Referral underway for [facility]

## 2013-08-28 NOTE — Telephone Encounter (Signed)
Per [facility] the patient not need a referral to see a optometrist, Dr. Eula Flax.

## 2013-09-23 ENCOUNTER — Telehealth: Payer: Self-pay | Admitting: Internal Medicine

## 2013-09-23 DIAGNOSIS — E039 Hypothyroidism, unspecified: Secondary | ICD-10-CM

## 2013-09-23 DIAGNOSIS — K219 Gastro-esophageal reflux disease without esophagitis: Secondary | ICD-10-CM

## 2013-09-23 DIAGNOSIS — E7800 Pure hypercholesterolemia, unspecified: Secondary | ICD-10-CM

## 2013-09-23 NOTE — Telephone Encounter (Signed)
Orders placed for labs

## 2013-09-23 NOTE — Telephone Encounter (Signed)
Pt called to rs cpe that had been rs due to provider schedule change.  Appt was rs from May to July.  Pt unhappy.  Rs pt to June 4.  Pt scheduled labs appt June 2 to do prior to cpe.  No orders in.

## 2013-10-08 ENCOUNTER — Emergency Department: Payer: Self-pay | Admitting: Emergency Medicine

## 2013-10-09 ENCOUNTER — Telehealth: Payer: Self-pay | Admitting: Internal Medicine

## 2013-10-09 NOTE — Telephone Encounter (Signed)
Spoke with pt, advised to take Rx to pharmacy because pharmacy will dispense generic unless specifically written for brand name. Pt verbalized understanding.

## 2013-10-09 NOTE — Telephone Encounter (Signed)
Pt states she has script for percocet from hospital ER for hairline fracture on right foot and left foot hairline fracture on side of foot.  [facility] and told her they would not pay for percocet 325/5 but told her she could get generic oxycodone/acetaminophen that would only cost her $5.  Asking if Dr. Nicki Reaper could call this in for her to [facility].

## 2013-10-10 ENCOUNTER — Telehealth: Payer: Self-pay | Admitting: Internal Medicine

## 2013-10-10 ENCOUNTER — Telehealth: Payer: Self-pay | Admitting: *Deleted

## 2013-10-10 DIAGNOSIS — S92909A Unspecified fracture of unspecified foot, initial encounter for closed fracture: Secondary | ICD-10-CM

## 2013-10-10 NOTE — Telephone Encounter (Signed)
Spoke with pt regarding ED visit for foot fracture. She has not been set up with a referral yet. She would like to see Dr. Thornton Park that was recommended to her at ED. Pt was able to fill her oxycodone prescription last night.

## 2013-10-10 NOTE — Telephone Encounter (Signed)
Order placed for referral to ortho per pt request.

## 2013-10-10 NOTE — Telephone Encounter (Signed)
Faxed referral request to [facility] for referral to [facility] Dr. Mack Guise

## 2013-10-28 ENCOUNTER — Encounter: Payer: Medicare Other | Admitting: Internal Medicine

## 2013-11-12 ENCOUNTER — Other Ambulatory Visit: Payer: Medicare Other

## 2013-11-14 ENCOUNTER — Encounter: Payer: Medicare HMO | Admitting: Internal Medicine

## 2013-12-05 ENCOUNTER — Encounter: Payer: Medicare HMO | Admitting: Internal Medicine

## 2013-12-16 ENCOUNTER — Encounter: Payer: Medicare HMO | Admitting: Internal Medicine

## 2013-12-19 ENCOUNTER — Other Ambulatory Visit (INDEPENDENT_AMBULATORY_CARE_PROVIDER_SITE_OTHER): Payer: Medicare HMO

## 2013-12-19 ENCOUNTER — Ambulatory Visit (INDEPENDENT_AMBULATORY_CARE_PROVIDER_SITE_OTHER): Payer: Medicare HMO | Admitting: Adult Health

## 2013-12-19 ENCOUNTER — Encounter: Payer: Self-pay | Admitting: Adult Health

## 2013-12-19 VITALS — BP 129/81 | HR 61 | Temp 98.1°F | Resp 16 | Ht 63.0 in | Wt 143.8 lb

## 2013-12-19 DIAGNOSIS — K219 Gastro-esophageal reflux disease without esophagitis: Secondary | ICD-10-CM

## 2013-12-19 DIAGNOSIS — E038 Other specified hypothyroidism: Secondary | ICD-10-CM

## 2013-12-19 DIAGNOSIS — J069 Acute upper respiratory infection, unspecified: Secondary | ICD-10-CM

## 2013-12-19 DIAGNOSIS — F411 Generalized anxiety disorder: Secondary | ICD-10-CM

## 2013-12-19 DIAGNOSIS — E7800 Pure hypercholesterolemia, unspecified: Secondary | ICD-10-CM

## 2013-12-19 DIAGNOSIS — E039 Hypothyroidism, unspecified: Secondary | ICD-10-CM

## 2013-12-19 LAB — CBC WITH DIFFERENTIAL/PLATELET
Basophils Absolute: 0 10*3/uL (ref 0.0–0.1)
Basophils Relative: 0.3 % (ref 0.0–3.0)
Eosinophils Absolute: 0.1 10*3/uL (ref 0.0–0.7)
Eosinophils Relative: 1.5 % (ref 0.0–5.0)
HCT: 38.5 % (ref 36.0–46.0)
Hemoglobin: 13.1 g/dL (ref 12.0–15.0)
Lymphocytes Relative: 18.3 % (ref 12.0–46.0)
Lymphs Abs: 1.3 10*3/uL (ref 0.7–4.0)
MCHC: 34 g/dL (ref 30.0–36.0)
MCV: 96.5 fl (ref 78.0–100.0)
Monocytes Absolute: 0.5 10*3/uL (ref 0.1–1.0)
Monocytes Relative: 7.6 % (ref 3.0–12.0)
Neutro Abs: 5.1 10*3/uL (ref 1.4–7.7)
Neutrophils Relative %: 72.3 % (ref 43.0–77.0)
Platelets: 251 10*3/uL (ref 150.0–400.0)
RBC: 3.99 Mil/uL (ref 3.87–5.11)
RDW: 12.5 % (ref 11.5–15.5)
WBC: 7.1 10*3/uL (ref 4.0–10.5)

## 2013-12-19 LAB — COMPREHENSIVE METABOLIC PANEL WITH GFR
ALT: 15 U/L (ref 0–35)
AST: 18 U/L (ref 0–37)
Albumin: 4.6 g/dL (ref 3.5–5.2)
Alkaline Phosphatase: 74 U/L (ref 39–117)
BUN: 15 mg/dL (ref 6–23)
CO2: 25 meq/L (ref 19–32)
Calcium: 9.9 mg/dL (ref 8.4–10.5)
Chloride: 102 meq/L (ref 96–112)
Creatinine, Ser: 0.7 mg/dL (ref 0.4–1.2)
GFR: 84.07 mL/min
Glucose, Bld: 96 mg/dL (ref 70–99)
Potassium: 4.2 meq/L (ref 3.5–5.1)
Sodium: 137 meq/L (ref 135–145)
Total Bilirubin: 0.8 mg/dL (ref 0.2–1.2)
Total Protein: 7.3 g/dL (ref 6.0–8.3)

## 2013-12-19 LAB — LIPID PANEL
Cholesterol: 182 mg/dL (ref 0–200)
HDL: 64.4 mg/dL
LDL Cholesterol: 93 mg/dL (ref 0–99)
NonHDL: 117.6
Total CHOL/HDL Ratio: 3
Triglycerides: 123 mg/dL (ref 0.0–149.0)
VLDL: 24.6 mg/dL (ref 0.0–40.0)

## 2013-12-19 LAB — TSH: TSH: 0.4 u[IU]/mL (ref 0.35–4.50)

## 2013-12-19 MED ORDER — AZITHROMYCIN 250 MG PO TABS: ORAL_TABLET | ORAL | Status: DC

## 2013-12-19 MED ORDER — BUSPIRONE HCL 5 MG PO TABS: 5.0000 mg | ORAL_TABLET | Freq: Two times a day (BID) | ORAL | Status: DC

## 2013-12-19 NOTE — Patient Instructions (Signed)
  Start Azithromycin 2 tablets today then 1 tablet daily for the next 4 days.  Drink fluids to stay hydrated.  Continue with the sinus rinse and mucinex.  For anxiety I am starting you on buspar (buspirone) 5 mg twice a day. Please follow up with Dr. Nicki Reaper when things settle down with your husband.  Call with any questions or concerns.

## 2013-12-19 NOTE — Progress Notes (Signed)
Subjective:    Patient ID: [name], female    DOB: 07-Sep-1945, 68 y.o.   MRN: [id number]  Cough Associated symptoms include wheezing. Pertinent negatives include no fever.    68 yo female presents today with symptoms of an upper respiratory infection. Started with a sore, scratchy throat which has improved. Now describes feelings of chest congestion and started coughing over the past couple of days. Attempted Mucinex and sinus rinses.   Describes fatigue and exhaustion. Husband has been in the hospital for the past 6 weeks. She has been staying with him 6 nights/week.    Past Medical History  Diagnosis Date  . Hypothyroidism   . Environmental allergies   . Hypercholesterolemia   . Arthritis   . GERD (gastroesophageal reflux disease)   . Esophagitis     [place], followed by Dr Vennie Homans    Current Outpatient Prescriptions on File Prior to Visit  Medication Sig Dispense Refill  . acetaminophen (TYLENOL) 500 MG tablet Take 500 mg by mouth every 6 (six) hours as needed.      Marland Kitchen albuterol (PROVENTIL HFA;VENTOLIN HFA) 108 (90 BASE) MCG/ACT inhaler Inhale 2 puffs into the lungs every 6 (six) hours as needed for wheezing or shortness of breath.  1 Inhaler  0  . cholecalciferol (VITAMIN D) 1000 UNITS tablet Take 1,000 Units by mouth daily.      . Coenzyme Q10 60 MG TABS Take 120 mg by mouth daily.      . Hypertonic Nasal Wash (SINUS RINSE NA) Place into the nose as needed.      . Lactobacillus (PROBIOTIC ACIDOPHILUS PO) Take 1,360 mg by mouth daily.      Marland Kitchen levothyroxine (LEVOTHROID) 75 MCG tablet Take 1 tablet (75 mcg total) by mouth every other day.  90 tablet  2  . levothyroxine (SYNTHROID, LEVOTHROID) 50 MCG tablet Take 1 tablet (50 mcg total) by mouth every other day.  90 tablet  2  . meloxicam (MOBIC) 7.5 MG tablet Take 1 tablet (7.5 mg total) by mouth daily.  90 tablet  1  . Multiple Vitamin (MULTIVITAMIN) LIQD Take 5 mLs by mouth daily.      . Omega-3 Fatty Acids (FISH OIL)  1360 MG CAPS Take by mouth daily.      Marland Kitchen omeprazole (PRILOSEC) 20 MG capsule Take 1 capsule (20 mg total) by mouth daily.  90 capsule  1  . rosuvastatin (CRESTOR) 5 MG tablet Take 2 tablets (10mg ) by mouth on Mon, Wed and Friday and continue 1 tablet by mouth (5mg ) on all other days  120 tablet  3  . triamcinolone (NASACORT) 55 MCG/ACT AERO nasal inhaler Place 2 sprays into the nose daily.  1 Inhaler  5   No current facility-administered medications on file prior to visit.     Review of Systems  Constitutional: Positive for fatigue. Negative for fever.  HENT: Positive for congestion.   Respiratory: Positive for cough (small sputum production) and wheezing.   Cardiovascular: Negative.   Psychiatric/Behavioral:       Anxiety   All other systems reviewed and are negative.      Objective:  BP 129/81  Pulse 61  Temp(Src) 98.1 F (36.7 C) (Oral)  Resp 16  Wt 143 lb 12 oz (65.205 kg)  SpO2 95%   Physical Exam  Constitutional: She is oriented to person, place, and time. No distress.  HENT:  Mouth/Throat: Posterior oropharyngeal erythema present. No oropharyngeal exudate.  Cardiovascular: Normal rate, regular rhythm and  normal heart sounds.   Pulmonary/Chest: Effort normal and breath sounds normal.  Lymphadenopathy:    She has cervical adenopathy.  Neurological: She is alert and oriented to person, place, and time.  Skin: Skin is warm and dry.  Psychiatric: She has a normal mood and affect. Her behavior is normal. Judgment and thought content normal.      Assessment & Plan:  1. Upper respiratory infection with cough and congestion Symptoms consistent with bacterial URI. Start Azithromycin as described below. Continue to take OTC Mucinex and perform sinus rinses as needed. Drink plenty of fluids. Return to clinic if symptoms worsen or fail to improve.  - azithromycin (ZITHROMAX) 250 MG tablet; Start 2 tablets today then 1 tablet daily for 4 days.  Dispense: 6 tablet; Refill:  0  2. Anxiety state, unspecified Anxiety secondary to husband's 6 week hospitalization with stomach cancer. Requesting something to help her take the edge off. Start buspirone as described below. Administration instructions and side effects were discussed and acknowledged by patient.   - busPIRone (BUSPAR) 5 MG tablet; Take 1 tablet (5 mg total) by mouth 2 (two) times daily.  Dispense: 60 tablet; Refill: 2

## 2013-12-19 NOTE — Progress Notes (Signed)
Pre visit review using our clinic review tool, if applicable. No additional management support is needed unless otherwise documented below in the visit note. 

## 2013-12-20 ENCOUNTER — Encounter: Payer: Self-pay | Admitting: *Deleted

## 2013-12-27 ENCOUNTER — Other Ambulatory Visit: Payer: Self-pay | Admitting: *Deleted

## 2013-12-27 ENCOUNTER — Telehealth: Payer: Self-pay | Admitting: Internal Medicine

## 2013-12-27 DIAGNOSIS — J069 Acute upper respiratory infection, unspecified: Secondary | ICD-10-CM

## 2013-12-27 MED ORDER — AZITHROMYCIN 250 MG PO TABS: ORAL_TABLET | ORAL | Status: DC

## 2013-12-27 NOTE — Telephone Encounter (Signed)
Reviewed note from [facility].  Felt to have bacterial URI.  If better, but symptoms not completely resolved, can repeat zpak x 1.  (hold cholesterol medication while taking).  May have some residual cough for a while.  Make sure taking a probiotic since on abx.  Let us know if persistent problems.

## 2013-12-27 NOTE — Telephone Encounter (Signed)
Spoke with pt advised of MDs message 

## 2013-12-27 NOTE — Telephone Encounter (Signed)
Caller: [name]/Patient; Phone: [phone]; Reason for Call: Pt states she saw NP in office on 7/9 and diagnosed with resp infection and given Zpak.  States overall, her sxs have improved (no new or worsening sxs for triage, but not resolved), still has some chest and sinus congestion with thick mucus.  States she is still taking Mucinex and doing saline nasal washes but wonders if there is anything else MD/NP would recommend or prescribe as she has had sxs for almost 2 wks now.  Assured her will send message and someone will f/u with her.  **Please call pt before prescribing as she wants to change her pharmacy on file to a different one---change to [facility] in [place].

## 2014-01-15 ENCOUNTER — Other Ambulatory Visit: Payer: Self-pay | Admitting: *Deleted

## 2014-01-15 ENCOUNTER — Telehealth: Payer: Self-pay | Admitting: *Deleted

## 2014-01-15 MED ORDER — PANTOPRAZOLE SODIUM 40 MG PO TBEC: 40.0000 mg | DELAYED_RELEASE_TABLET | Freq: Every day | ORAL | Status: DC

## 2014-01-15 NOTE — Telephone Encounter (Signed)
Pt notified & Rx sent to [facility]

## 2014-01-15 NOTE — Telephone Encounter (Signed)
I am ok to change omeprazole to protonix 40mg  q day.  Where does she want it sent?

## 2014-01-15 NOTE — Telephone Encounter (Signed)
Pt wanted to know if she could get a RX for Pantoprazole 40mg  instead in place of the Omeprazole 20mg . She states that she has tried her husbands & its seems to work well. She has an appt on 02/14/14. She also wanted you to know that her husband goes back to the cancer doctor on Friday.

## 2014-01-21 ENCOUNTER — Ambulatory Visit: Payer: Self-pay | Admitting: Internal Medicine

## 2014-01-21 LAB — HM MAMMOGRAPHY: HM Mammogram: NEGATIVE

## 2014-01-23 ENCOUNTER — Encounter: Payer: Self-pay | Admitting: *Deleted

## 2014-01-31 ENCOUNTER — Encounter: Payer: Self-pay | Admitting: Internal Medicine

## 2014-02-07 ENCOUNTER — Encounter: Payer: Self-pay | Admitting: Internal Medicine

## 2014-02-14 ENCOUNTER — Ambulatory Visit (INDEPENDENT_AMBULATORY_CARE_PROVIDER_SITE_OTHER): Payer: Medicare HMO | Admitting: Internal Medicine

## 2014-02-14 ENCOUNTER — Encounter: Payer: Self-pay | Admitting: Internal Medicine

## 2014-02-14 VITALS — BP 110/70 | HR 65 | Temp 98.5°F | Ht 63.0 in | Wt 143.8 lb

## 2014-02-14 DIAGNOSIS — Z860100 Personal history of colon polyps, unspecified: Secondary | ICD-10-CM

## 2014-02-14 DIAGNOSIS — M79609 Pain in unspecified limb: Secondary | ICD-10-CM

## 2014-02-14 DIAGNOSIS — K2100 Gastro-esophageal reflux disease with esophagitis, without bleeding: Secondary | ICD-10-CM

## 2014-02-14 DIAGNOSIS — F439 Reaction to severe stress, unspecified: Secondary | ICD-10-CM

## 2014-02-14 DIAGNOSIS — E039 Hypothyroidism, unspecified: Secondary | ICD-10-CM

## 2014-02-14 DIAGNOSIS — M79671 Pain in right foot: Secondary | ICD-10-CM

## 2014-02-14 DIAGNOSIS — Z733 Stress, not elsewhere classified: Secondary | ICD-10-CM

## 2014-02-14 DIAGNOSIS — E7800 Pure hypercholesterolemia, unspecified: Secondary | ICD-10-CM

## 2014-02-14 DIAGNOSIS — Z23 Encounter for immunization: Secondary | ICD-10-CM

## 2014-02-14 NOTE — Patient Instructions (Signed)
[name] - phone [phone]

## 2014-02-14 NOTE — Progress Notes (Signed)
Pre visit review using our clinic review tool, if applicable. No additional management support is needed unless otherwise documented below in the visit note. 

## 2014-02-17 ENCOUNTER — Encounter: Payer: Self-pay | Admitting: Internal Medicine

## 2014-02-17 DIAGNOSIS — M79671 Pain in right foot: Secondary | ICD-10-CM | POA: Insufficient documentation

## 2014-02-17 NOTE — Assessment & Plan Note (Signed)
Symptoms controlled on current regimen.  Had follow up with Dr Vennie Homans 04/10/12.  Had follow up EGD.

## 2014-02-17 NOTE — Assessment & Plan Note (Signed)
On Levothyroxine.  Follow tsh.

## 2014-02-17 NOTE — Assessment & Plan Note (Signed)
Persistent right foot pain.  Refer back to ortho for further evaluation and treatment.

## 2014-02-17 NOTE — Assessment & Plan Note (Signed)
On a low dose of Crestor and tolerating.  Follow lipid panel and liver function.

## 2014-02-17 NOTE — Progress Notes (Signed)
Subjective:    Patient ID: [name], female    DOB: 04/24/1946, 68 y.o.   MRN: [id number]  HPI 68 year old female with past history of hypothyroidism, hypercholesterolemia and GERD who comes in today to follow up on these issues as well as for a complete physical exam.   She states she is doing relatively well.  Increased stress with her husband's medical issues.  Husband has recently been diagnosed with gastric cancer.   Feels she is handling things relatively well.  Some increased stress and anxiety.  Taking buspar.  This is helping.  No chest pain.  Eating and drinking well.  No nausea or vomiting.  No acid reflux.  No bowel change.  Still seeing Dr Vennie Homans.  Taking crestor.  Toleratng.  Still having issues with her right foot.  Persistent pain.  Request referral back to ortho.     Past Medical History  Diagnosis Date  . Hypothyroidism   . Environmental allergies   . Hypercholesterolemia   . Arthritis   . GERD (gastroesophageal reflux disease)   . Esophagitis     [place], followed by Dr Vennie Homans    Current Outpatient Prescriptions on File Prior to Visit  Medication Sig Dispense Refill  . acetaminophen (TYLENOL) 500 MG tablet Take 500 mg by mouth every 6 (six) hours as needed.      . busPIRone (BUSPAR) 5 MG tablet Take 1 tablet (5 mg total) by mouth 2 (two) times daily.  60 tablet  2  . Coenzyme Q10 60 MG TABS Take 120 mg by mouth daily.      . Hypertonic Nasal Wash (SINUS RINSE NA) Place into the nose as needed.      . Lactobacillus (PROBIOTIC ACIDOPHILUS PO) Take 1,360 mg by mouth daily.      Marland Kitchen levothyroxine (LEVOTHROID) 75 MCG tablet Take 1 tablet (75 mcg total) by mouth every other day.  90 tablet  2  . levothyroxine (SYNTHROID, LEVOTHROID) 50 MCG tablet Take 1 tablet (50 mcg total) by mouth every other day.  90 tablet  2  . meloxicam (MOBIC) 7.5 MG tablet Take 1 tablet (7.5 mg total) by mouth daily.  90 tablet  1  . Multiple Vitamin (MULTIVITAMIN) LIQD Take 5 mLs by mouth  daily.      . Omega-3 Fatty Acids (FISH OIL) 1360 MG CAPS Take by mouth daily.      . pantoprazole (PROTONIX) 40 MG tablet Take 1 tablet (40 mg total) by mouth daily.  90 tablet  1  . rosuvastatin (CRESTOR) 5 MG tablet Take 2 tablets (10mg ) by mouth on Mon, Wed and Friday and continue 1 tablet by mouth (5mg ) on all other days  120 tablet  3   No current facility-administered medications on file prior to visit.    Review of Systems Patient denies any headache, lightheadedness or dizziness.  No significant sinus or allergy symptoms currently.   No chest pain, tightness or palpitations. No increased shortness of breath, cough or congestion.  Acid reflux controlled on current med regimen.  No dysphagia or odynophagia.  No nausea or vomiting.  No abdominal pain or cramping.  No bowel change, such as diarrhea, constipation, BRBPR or melana.  No urine change.  Increased stress and anxiety as outlined.  Discussed at length with her today.  Taking buspar as needed.  Tolerating.          Objective:   Physical Exam  Filed Vitals:   02/14/14 1333  BP:  110/70  Pulse: 65  Temp: 98.5 F (20.69 C)   68 year old female in no acute distress.   HEENT:  Nares- clear.  Oropharynx - without lesions. NECK:  Supple.  Nontender.  No audible bruit.  HEART:  Appears to be regular. LUNGS:  No crackles or wheezing audible.  Respirations even and unlabored.  RADIAL PULSE:  Equal bilaterally.    BREASTS:  No nipple discharge or nipple retraction present.  Could not appreciate any distinct nodules or axillary adenopathy.  ABDOMEN:  Soft, nontender.  Bowel sounds present and normal.  No audible abdominal bruit.  GU:  Not performed.    EXTREMITIES:  No increased edema present.  DP pulses palpable and equal bilaterally.           Assessment & Plan:  ALLERGIES.  Controlled with nasacort and saline nasal flushes.   CARDIOVASCULAR.  Currently asymptomatic.  Follow.   HEALTH MAINTENANCE.  Physical today.   Is s/p  hysterectomy and does not require yearly pap smears.   Mammogram 01/21/14 - Birads I. .  Up to date with colonoscopy.

## 2014-02-17 NOTE — Assessment & Plan Note (Signed)
Colonoscopy 05/06/13 - two polyps and non bleeding internal hemorrhoids.  Recommend f/u in five years.  Biopsy - tubular adenoma.

## 2014-02-17 NOTE — Assessment & Plan Note (Signed)
Increased stress and some anxiety.   Discussed at length with her.  She is taking buspar as needed.  Tolerating.  This helps.  Discussed counseling.  Number given to [employer] .

## 2014-02-18 ENCOUNTER — Other Ambulatory Visit: Payer: Self-pay | Admitting: *Deleted

## 2014-02-18 MED ORDER — MELOXICAM 7.5 MG PO TABS: 7.5000 mg | ORAL_TABLET | Freq: Every day | ORAL | Status: DC

## 2014-02-18 NOTE — Telephone Encounter (Signed)
Refilled mobic #90 with no refills ([facility] mail order pharmacy)

## 2014-02-18 NOTE — Telephone Encounter (Signed)
Refill

## 2014-03-07 ENCOUNTER — Other Ambulatory Visit: Payer: Self-pay | Admitting: *Deleted

## 2014-03-07 DIAGNOSIS — F411 Generalized anxiety disorder: Secondary | ICD-10-CM

## 2014-03-07 MED ORDER — BUSPIRONE HCL 5 MG PO TABS: 5.0000 mg | ORAL_TABLET | Freq: Two times a day (BID) | ORAL | Status: DC

## 2014-03-10 ENCOUNTER — Other Ambulatory Visit: Payer: Self-pay | Admitting: *Deleted

## 2014-03-10 DIAGNOSIS — F411 Generalized anxiety disorder: Secondary | ICD-10-CM

## 2014-03-10 MED ORDER — BUSPIRONE HCL 5 MG PO TABS: 5.0000 mg | ORAL_TABLET | Freq: Two times a day (BID) | ORAL | Status: DC

## 2014-03-27 ENCOUNTER — Telehealth: Payer: Self-pay | Admitting: Internal Medicine

## 2014-03-27 NOTE — Telephone Encounter (Signed)
Patient at [facility] awaiting a referral for upper abdominal pain, stated they can see patient but needs a referral from MD.  Patient is [name].

## 2014-03-27 NOTE — Telephone Encounter (Signed)
Per Bonnita Nasuti - taken care of.

## 2014-04-04 ENCOUNTER — Ambulatory Visit (INDEPENDENT_AMBULATORY_CARE_PROVIDER_SITE_OTHER): Payer: Medicare HMO | Admitting: Internal Medicine

## 2014-04-04 ENCOUNTER — Encounter: Payer: Self-pay | Admitting: Internal Medicine

## 2014-04-04 VITALS — BP 130/78 | HR 57 | Temp 98.1°F | Ht 63.0 in | Wt 145.5 lb

## 2014-04-04 DIAGNOSIS — Z860100 Personal history of colon polyps, unspecified: Secondary | ICD-10-CM

## 2014-04-04 DIAGNOSIS — E7800 Pure hypercholesterolemia, unspecified: Secondary | ICD-10-CM

## 2014-04-04 DIAGNOSIS — Z658 Other specified problems related to psychosocial circumstances: Secondary | ICD-10-CM

## 2014-04-04 DIAGNOSIS — F439 Reaction to severe stress, unspecified: Secondary | ICD-10-CM

## 2014-04-04 DIAGNOSIS — M79671 Pain in right foot: Secondary | ICD-10-CM

## 2014-04-04 DIAGNOSIS — K2100 Gastro-esophageal reflux disease with esophagitis, without bleeding: Secondary | ICD-10-CM

## 2014-04-04 DIAGNOSIS — E039 Hypothyroidism, unspecified: Secondary | ICD-10-CM

## 2014-04-04 MED ORDER — OMEPRAZOLE 20 MG PO CPDR: 20.0000 mg | DELAYED_RELEASE_CAPSULE | Freq: Every day | ORAL | Status: DC

## 2014-04-04 NOTE — Progress Notes (Signed)
Pre visit review using our clinic review tool, if applicable. No additional management support is needed unless otherwise documented below in the visit note. 

## 2014-04-11 ENCOUNTER — Telehealth: Payer: Self-pay | Admitting: Internal Medicine

## 2014-04-11 DIAGNOSIS — M25649 Stiffness of unspecified hand, not elsewhere classified: Secondary | ICD-10-CM

## 2014-04-11 NOTE — Telephone Encounter (Signed)
Pt requesting   referral to Dr Barbara Cower   for hand pain, she stated you  gave her their  phone number  but they will not schedule with pt.

## 2014-04-11 NOTE — Telephone Encounter (Signed)
Order placed for referral to Dr Precious Reel.

## 2014-04-13 ENCOUNTER — Encounter: Payer: Self-pay | Admitting: Internal Medicine

## 2014-04-13 NOTE — Progress Notes (Signed)
Subjective:    Patient ID: [name], female    DOB: April 25, 1946, 68 y.o.   MRN: [id number]  HPI 68 year old female with past history of hypothyroidism, hypercholesterolemia and GERD who comes in today for a scheduled follow up.   Increased stress with her husband's medical issues.  Husband has recently been diagnosed with gastric cancer.   Feels she is handling things relatively well.  Some increased stress and anxiety.  Taking buspar.  This is helping.  We discussed this at length today.  She desires no further intervention.  No chest pain.  Eating and drinking well.  No nausea or vomiting.  Just saw her gastroenterologist.  Off meloxicam now.  On dexilant.  Wants to change to prilosec for cost reasons.  No bowel change.  Taking crestor.  Toleratng.  Still having issues with her right foot.  Persistent pain.  Had stress fracture.     Past Medical History  Diagnosis Date  . Hypothyroidism   . Environmental allergies   . Hypercholesterolemia   . Arthritis   . GERD (gastroesophageal reflux disease)   . Esophagitis     [facility], followed by Dr Vennie Homans    Current Outpatient Prescriptions on File Prior to Visit  Medication Sig Dispense Refill  . acetaminophen (TYLENOL) 500 MG tablet Take 500 mg by mouth every 6 (six) hours as needed.    . busPIRone (BUSPAR) 5 MG tablet Take 1 tablet (5 mg total) by mouth 2 (two) times daily. 60 tablet 2  . Cholecalciferol (VITAMIN D PO) Take 5,000 Units by mouth daily.    . Coenzyme Q10 60 MG TABS Take 120 mg by mouth daily.    . Hypertonic Nasal Wash (SINUS RINSE NA) Place into the nose as needed.    . Lactobacillus (PROBIOTIC ACIDOPHILUS PO) Take 1,360 mg by mouth daily.    Marland Kitchen levothyroxine (LEVOTHROID) 75 MCG tablet Take 1 tablet (75 mcg total) by mouth every other day. 90 tablet 2  . levothyroxine (SYNTHROID, LEVOTHROID) 50 MCG tablet Take 1 tablet (50 mcg total) by mouth every other day. 90 tablet 2  . Multiple Vitamin (MULTIVITAMIN) LIQD Take 5 mLs  by mouth daily.    . Omega-3 Fatty Acids (FISH OIL) 1360 MG CAPS Take by mouth daily.    . rosuvastatin (CRESTOR) 5 MG tablet Take 2 tablets (10mg ) by mouth on Mon, Wed and Friday and continue 1 tablet by mouth (5mg ) on all other days 120 tablet 3   No current facility-administered medications on file prior to visit.    Review of Systems Patient denies any headache, lightheadedness or dizziness.  No significant sinus or allergy symptoms currently.   No chest pain, tightness or palpitations. No increased shortness of breath, cough or congestion.  Acid reflux controlled on current med regimen. On dexilant.  Wants to change to prilosec.   No dysphagia or odynophagia.  No nausea or vomiting.  No abdominal pain or cramping.  No bowel change, such as diarrhea, constipation, BRBPR or melana.  No urine change.  Increased stress and anxiety as outlined.  Discussed at length with her today.  Taking buspar as needed.  Tolerating.          Objective:   Physical Exam  Filed Vitals:   04/04/14 1511  BP: 130/78  Pulse: 57  Temp: 98.1 F (36.7 C)   Blood pressure recheck:  24/73  68 year old female in no acute distress.   HEENT:  Nares- clear.  Oropharynx -  without lesions. NECK:  Supple.  Nontender.  No audible bruit.  HEART:  Appears to be regular. LUNGS:  No crackles or wheezing audible.  Respirations even and unlabored.  RADIAL PULSE:  Equal bilaterally.  ABDOMEN:  Soft, nontender.  Bowel sounds present and normal.  No audible abdominal bruit.   EXTREMITIES:  No increased edema present.  DP pulses palpable and equal bilaterally.  MSK:  Increased pain to palpation over the left fifth metatarsal.         Assessment & Plan:  ALLERGIES.  Controlled with nasacort and saline nasal flushes.   CARDIOVASCULAR.  Currently asymptomatic.  Follow.   Gastroesophageal reflux disease with esophagitis Just saw her gastroenterologist.  Changed to dexilant.  Controlled symptoms.  Wants to change to  prilosec for cost reasons.  Follow.    Hypothyroidism, unspecified hypothyroidism type On thyroid replacement.  Follow tsh.   Stress Discussed at length with her today.  She feels she is coping relatively well.  Desires no further intervention.  Continue current medication regimen.    Hypercholesteremia Low cholesterol diet.  Continue crestor.  Tolerating.   Lab Results  Component Value Date   CHOL 182 12/19/2013   HDL 64.40 12/19/2013   LDLCALC 93 12/19/2013   LDLDIRECT 123.4 02/27/2013   TRIG 123.0 12/19/2013   CHOLHDL 3 12/19/2013       History of colonic polyps Colonoscopy 05/06/13 - two polyps and non bleeding internal hemorrhoids.  Recommend f/u in five years.  Biopsy - tubular adenoma (x2).   Right foot pain Persistent pain.  Found to have stress fracture.  Continue f/u with ortho/podiatry.    HEALTH MAINTENANCE.  Physical last visit.   Is s/p hysterectomy and does not require yearly pap smears.   Mammogram 01/21/14 - Birads I. .  Up to date with colonoscopy.   I spent 25 minutes with the patient and more than 50% of the time was spent in consultation regarding the above.

## 2014-05-07 ENCOUNTER — Other Ambulatory Visit: Payer: Self-pay | Admitting: Internal Medicine

## 2014-06-30 ENCOUNTER — Telehealth: Payer: Self-pay

## 2014-06-30 NOTE — Telephone Encounter (Signed)
The patient was scheduled for an apt on 1/27. She is hoping to be worked in for her follow up appointment due to the next open slot being several weeks away. Do you want her worked in? If so, when? Thanks!

## 2014-06-30 NOTE — Telephone Encounter (Signed)
Pt could not come in tomorrow, she is scheduled for Wednesday at 11:15  Let me know if you want this changed.

## 2014-06-30 NOTE — Telephone Encounter (Signed)
noted 

## 2014-06-30 NOTE — Telephone Encounter (Signed)
See if she can come in tomorrow at 3:00 (block 30 min).  See phone note.

## 2014-07-01 ENCOUNTER — Ambulatory Visit: Payer: Medicare HMO | Admitting: Internal Medicine

## 2014-07-02 ENCOUNTER — Encounter: Payer: Self-pay | Admitting: Internal Medicine

## 2014-07-02 ENCOUNTER — Ambulatory Visit (INDEPENDENT_AMBULATORY_CARE_PROVIDER_SITE_OTHER): Payer: PPO | Admitting: Internal Medicine

## 2014-07-02 VITALS — BP 118/70 | HR 63 | Temp 98.1°F | Ht 63.0 in | Wt 147.5 lb

## 2014-07-02 DIAGNOSIS — Z860100 Personal history of colon polyps, unspecified: Secondary | ICD-10-CM

## 2014-07-02 DIAGNOSIS — E7800 Pure hypercholesterolemia, unspecified: Secondary | ICD-10-CM

## 2014-07-02 DIAGNOSIS — E039 Hypothyroidism, unspecified: Secondary | ICD-10-CM

## 2014-07-02 DIAGNOSIS — Z658 Other specified problems related to psychosocial circumstances: Secondary | ICD-10-CM

## 2014-07-02 DIAGNOSIS — K2100 Gastro-esophageal reflux disease with esophagitis, without bleeding: Secondary | ICD-10-CM

## 2014-07-02 DIAGNOSIS — F439 Reaction to severe stress, unspecified: Secondary | ICD-10-CM

## 2014-07-02 NOTE — Progress Notes (Signed)
Pre visit review using our clinic review tool, if applicable. No additional management support is needed unless otherwise documented below in the visit note. 

## 2014-07-06 ENCOUNTER — Telehealth: Payer: Self-pay | Admitting: Internal Medicine

## 2014-07-06 ENCOUNTER — Encounter: Payer: Self-pay | Admitting: Internal Medicine

## 2014-07-06 NOTE — Progress Notes (Signed)
Subjective:    Patient ID: [name], female    DOB: 1945/10/05, 69 y.o.   MRN: [id number]  HPI 69 year old female with past history of hypothyroidism, hypercholesterolemia and GERD who comes in today for a scheduled follow up.   Increased stress with her husband's medical issues.  Husband has recently been diagnosed with gastric cancer. Has gone through treatments.  Cancer still present.   Feels she is handling things relatively well.  Some increased stress and anxiety.  Taking buspar.  This is helping.  We discussed this at length today.  She desires no further intervention.  No chest pain.  Eating and drinking well.  No nausea or vomiting.  Just saw her gastroenterologist.  Off meloxicam now.   No bowel change. Taking crestor.  Toleratng.      Past Medical History  Diagnosis Date  . Hypothyroidism   . Environmental allergies   . Hypercholesterolemia   . Arthritis   . GERD (gastroesophageal reflux disease)   . Esophagitis     [place], followed by Dr Vennie Homans    Current Outpatient Prescriptions on File Prior to Visit  Medication Sig Dispense Refill  . acetaminophen (TYLENOL) 500 MG tablet Take 500 mg by mouth every 6 (six) hours as needed.    . busPIRone (BUSPAR) 5 MG tablet TAKE 1 TABLET TWICE DAILY 180 tablet 2  . Cholecalciferol (VITAMIN D PO) Take 5,000 Units by mouth daily.    . Coenzyme Q10 60 MG TABS Take 120 mg by mouth daily.    . Hypertonic Nasal Wash (SINUS RINSE NA) Place into the nose as needed.    . Lactobacillus (PROBIOTIC ACIDOPHILUS PO) Take 1,360 mg by mouth daily.    Marland Kitchen levothyroxine (LEVOTHROID) 75 MCG tablet Take 1 tablet (75 mcg total) by mouth every other day. 90 tablet 2  . levothyroxine (SYNTHROID, LEVOTHROID) 50 MCG tablet Take 1 tablet (50 mcg total) by mouth every other day. 90 tablet 2  . Multiple Vitamin (MULTIVITAMIN) LIQD Take 5 mLs by mouth daily.    . Omega-3 Fatty Acids (FISH OIL) 1360 MG CAPS Take by mouth daily.    Marland Kitchen omeprazole (PRILOSEC) 20 MG  capsule Take 1 capsule (20 mg total) by mouth daily. 90 capsule 3  . rosuvastatin (CRESTOR) 5 MG tablet Take 2 tablets (10mg ) by mouth on Mon, Wed and Friday and continue 1 tablet by mouth (5mg ) on all other days 120 tablet 3   No current facility-administered medications on file prior to visit.    Review of Systems Patient denies any headache, lightheadedness or dizziness.  No significant sinus or allergy symptoms currently.   No chest pain, tightness or palpitations. No increased shortness of breath, cough or congestion.  Acid reflux controlled on current med regimen. Is followed by gastroenterology.   No dysphagia or odynophagia.  No nausea or vomiting.  No abdominal pain or cramping.  No bowel change, such as diarrhea, constipation, BRBPR or melana.  No urine change.  Increased stress and anxiety as outlined.  Discussed at length with her today.  Taking buspar as needed.  Tolerating.  Works well for her.         Objective:   Physical Exam  Filed Vitals:   07/02/14 1154  BP: 118/70  Pulse: 63  Temp: 98.1 F (54.10 C)   69 year old female in no acute distress.   HEENT:  Nares- clear.  Oropharynx - without lesions. NECK:  Supple.  Nontender.  No audible bruit.  HEART:  Appears to be regular. LUNGS:  No crackles or wheezing audible.  Respirations even and unlabored.  RADIAL PULSE:  Equal bilaterally.  ABDOMEN:  Soft, nontender.  Bowel sounds present and normal.  No audible abdominal bruit.   EXTREMITIES:  No increased edema present.  DP pulses palpable and equal bilaterally.        Assessment & Plan:  Gastroesophageal reflux disease with esophagitis Symptoms controlled.  Follow.    Hypothyroidism, unspecified hypothyroidism type On thyroid replacement.  Follow tsh.    History of colonic polyps Colonoscopy 05/06/13 - two polyps and non bleeding internal hemorrhoids.  Recommended f/u in five years.  Biopsy - tubular adenoma (x2).    Stress Increased stress with her husband's  medical issues.  Discussed at length with her today.  Takes buspar prn.  Desires no further intervention at this time.  Follow.    Hypercholesteremia Low cholesterol diet and exercise.  On crestor.  Follow lipid panel and liver function tests.    ALLERGIES.  Controlled with nasacort and saline nasal flushes.   CARDIOVASCULAR.  Currently asymptomatic.  Follow.   HEALTH MAINTENANCE.  Physical 02/14/14.   Is s/p hysterectomy and does not require yearly pap smears.   Mammogram 01/21/14 - Birads I. .  Up to date with colonoscopy.   I spent 25 minutes with the patient and more than 50% of the time was spent in consultation regarding the above.

## 2014-07-06 NOTE — Telephone Encounter (Signed)
Pt had needed refills and is changing mail order pharmacies.  Please clarify with her which medication she needed sent.  She had said the pharmacy was [facility] (phone [phone]) (fax [phone]).  Thanks.

## 2014-07-07 NOTE — Telephone Encounter (Signed)
Spoke to patient, not needing any refills at the moment, will contact office when she does. Pharmacy information updated.

## 2014-07-09 ENCOUNTER — Ambulatory Visit: Payer: Self-pay | Admitting: Internal Medicine

## 2014-07-10 ENCOUNTER — Other Ambulatory Visit: Payer: Medicare Other

## 2014-07-15 ENCOUNTER — Other Ambulatory Visit (INDEPENDENT_AMBULATORY_CARE_PROVIDER_SITE_OTHER): Payer: PPO

## 2014-07-15 DIAGNOSIS — E7800 Pure hypercholesterolemia, unspecified: Secondary | ICD-10-CM

## 2014-07-15 LAB — COMPREHENSIVE METABOLIC PANEL WITH GFR
ALT: 22 U/L (ref 0–35)
AST: 20 U/L (ref 0–37)
Albumin: 4.2 g/dL (ref 3.5–5.2)
Alkaline Phosphatase: 56 U/L (ref 39–117)
BUN: 14 mg/dL (ref 6–23)
CO2: 31 meq/L (ref 19–32)
Calcium: 9.4 mg/dL (ref 8.4–10.5)
Chloride: 105 meq/L (ref 96–112)
Creatinine, Ser: 0.62 mg/dL (ref 0.40–1.20)
GFR: 101.34 mL/min
Glucose, Bld: 87 mg/dL (ref 70–99)
Potassium: 3.6 meq/L (ref 3.5–5.1)
Sodium: 141 meq/L (ref 135–145)
Total Bilirubin: 0.5 mg/dL (ref 0.2–1.2)
Total Protein: 6.5 g/dL (ref 6.0–8.3)

## 2014-07-15 LAB — LIPID PANEL
Cholesterol: 159 mg/dL (ref 0–200)
HDL: 50.3 mg/dL
LDL Cholesterol: 84 mg/dL (ref 0–99)
NonHDL: 108.7
Total CHOL/HDL Ratio: 3
Triglycerides: 123 mg/dL (ref 0.0–149.0)
VLDL: 24.6 mg/dL (ref 0.0–40.0)

## 2014-07-16 ENCOUNTER — Encounter: Payer: Self-pay | Admitting: *Deleted

## 2014-09-01 ENCOUNTER — Other Ambulatory Visit: Payer: Self-pay | Admitting: *Deleted

## 2014-09-01 MED ORDER — OMEPRAZOLE 20 MG PO CPDR: 20.0000 mg | DELAYED_RELEASE_CAPSULE | Freq: Every day | ORAL | Status: DC

## 2014-10-06 ENCOUNTER — Other Ambulatory Visit: Payer: Self-pay | Admitting: *Deleted

## 2014-10-06 MED ORDER — LEVOTHYROXINE SODIUM 75 MCG PO TABS: 75.0000 ug | ORAL_TABLET | ORAL | Status: DC

## 2014-10-06 MED ORDER — LEVOTHYROXINE SODIUM 50 MCG PO TABS: 50.0000 ug | ORAL_TABLET | ORAL | Status: DC

## 2014-10-06 MED ORDER — BUSPIRONE HCL 5 MG PO TABS: 5.0000 mg | ORAL_TABLET | Freq: Two times a day (BID) | ORAL | Status: DC

## 2014-10-22 ENCOUNTER — Ambulatory Visit: Payer: Medicare Other

## 2014-10-31 ENCOUNTER — Ambulatory Visit: Payer: Medicare Other | Admitting: Internal Medicine

## 2014-11-04 ENCOUNTER — Telehealth: Payer: Self-pay | Admitting: *Deleted

## 2014-11-04 MED ORDER — ROSUVASTATIN CALCIUM 5 MG PO TABS: 5.0000 mg | ORAL_TABLET | Freq: Every day | ORAL | Status: DC

## 2014-11-04 NOTE — Telephone Encounter (Signed)
Ok to refill crestor daily as she is taking.  Tell her to let us know if need anything.

## 2014-11-04 NOTE — Telephone Encounter (Signed)
Pt called requesting a Crestor refill & states that she is taking her Crestor once a day now instead of the way it was previously Rx'd: 2 on Mon, Wed, & Fri and 1 all other days.  FYI: Pt was very emotional over the phone as she wanted to inform you that Hospice has been called into the home for her husband. He is in the end stage.

## 2014-11-17 ENCOUNTER — Encounter: Payer: Self-pay | Admitting: Nurse Practitioner

## 2014-11-17 ENCOUNTER — Ambulatory Visit (INDEPENDENT_AMBULATORY_CARE_PROVIDER_SITE_OTHER): Payer: PPO | Admitting: Nurse Practitioner

## 2014-11-17 ENCOUNTER — Ambulatory Visit: Payer: PPO | Admitting: Internal Medicine

## 2014-11-17 VITALS — BP 110/64 | HR 79 | Temp 98.1°F | Resp 14 | Ht 63.0 in | Wt 151.0 lb

## 2014-11-17 DIAGNOSIS — F439 Reaction to severe stress, unspecified: Secondary | ICD-10-CM

## 2014-11-17 DIAGNOSIS — Z658 Other specified problems related to psychosocial circumstances: Secondary | ICD-10-CM | POA: Diagnosis not present

## 2014-11-17 NOTE — Progress Notes (Signed)
   Subjective:    Patient ID: [name], female    DOB: 1946/05/01, 69 y.o.   MRN: [id number]  HPI  [name] is a 69 yo female with a CC of bereavement and improved GERD.   1) Wants to up buspar to 3 x daily.  Saddened and tearful due to recent loss of husband on 11/14/14 She is supported by family and friends. It happened pretty quickly, but chronic illness over a long period of time. She reports he was moved to hospice and passed there. She was pleased with his care and feels he is not suffering anymore.   Review of Systems  Constitutional: Negative for fever, chills, diaphoresis and fatigue.  Respiratory: Negative for chest tightness, shortness of breath and wheezing.   Cardiovascular: Negative for chest pain, palpitations and leg swelling.  Gastrointestinal: Negative for nausea, vomiting and diarrhea.  Skin: Negative for rash.  Neurological: Negative for dizziness, weakness, numbness and headaches.  Psychiatric/Behavioral: Positive for sleep disturbance. The patient is nervous/anxious.       Objective:   Physical Exam  Constitutional: She is oriented to person, place, and time. She appears well-developed and well-nourished. No distress.  HENT:  Head: Normocephalic and atraumatic.  Right Ear: External ear normal.  Left Ear: External ear normal.  Cardiovascular: Normal rate, regular rhythm, normal heart sounds and intact distal pulses.  Exam reveals no gallop and no friction rub.   No murmur heard. Pulmonary/Chest: Effort normal and breath sounds normal. No respiratory distress. She has no wheezes. She has no rales. She exhibits no tenderness.  Neurological: She is alert and oriented to person, place, and time. No cranial nerve deficit. She exhibits normal muscle tone. Coordination normal.  Skin: Skin is warm and dry. No rash noted. She is not diaphoretic.  Psychiatric: She has a normal mood and affect. Her behavior is normal. Judgment and thought content normal.  Tearful when  discussing recent passing of husband      Assessment & Plan:

## 2014-11-17 NOTE — Progress Notes (Signed)
Pre visit review using our clinic review tool, if applicable. No additional management support is needed unless otherwise documented below in the visit note. 

## 2014-11-30 NOTE — Assessment & Plan Note (Addendum)
Upped Buspar to 3 x daily for increased stress due to recent loss of husband. Discussed plans for bereavement care. She has several options through Hospice.

## 2015-01-22 ENCOUNTER — Telehealth: Payer: Self-pay | Admitting: *Deleted

## 2015-01-22 ENCOUNTER — Telehealth: Payer: Self-pay | Admitting: Internal Medicine

## 2015-01-22 DIAGNOSIS — K2100 Gastro-esophageal reflux disease with esophagitis, without bleeding: Secondary | ICD-10-CM

## 2015-01-22 DIAGNOSIS — E039 Hypothyroidism, unspecified: Secondary | ICD-10-CM

## 2015-01-22 DIAGNOSIS — E7800 Pure hypercholesterolemia, unspecified: Secondary | ICD-10-CM

## 2015-01-22 DIAGNOSIS — Z1239 Encounter for other screening for malignant neoplasm of breast: Secondary | ICD-10-CM

## 2015-01-22 NOTE — Telephone Encounter (Signed)
Pt notified of lab & reports that she is doing okay & trying to keep her mind occupied & stay busy.

## 2015-01-22 NOTE — Telephone Encounter (Signed)
Pt called wanting to know if she needed to have lab work done before her follow up appt on 10/13.Marland Kitchen Please advise pt.

## 2015-01-22 NOTE — Telephone Encounter (Signed)
Yes, she can come in for labs at her convenience within the next few weeks.  This will need to be a fasting lab.  Also, her husband recently passed - confirm doing ok and confirm does not need anything more.  Thanks

## 2015-01-22 NOTE — Telephone Encounter (Signed)
Order placed for mammogram. Pt will call to schedule appt

## 2015-01-23 NOTE — Telephone Encounter (Signed)
Order placed for labs.

## 2015-01-29 ENCOUNTER — Telehealth: Payer: Self-pay | Admitting: *Deleted

## 2015-01-29 ENCOUNTER — Ambulatory Visit
Admission: RE | Admit: 2015-01-29 | Discharge: 2015-01-29 | Disposition: A | Discharge status: Home / Self Care | Payer: PPO | Source: Ambulatory Visit | Attending: Internal Medicine | Admitting: Internal Medicine

## 2015-01-29 ENCOUNTER — Encounter: Payer: Self-pay | Admitting: Internal Medicine

## 2015-01-29 ENCOUNTER — Other Ambulatory Visit (INDEPENDENT_AMBULATORY_CARE_PROVIDER_SITE_OTHER): Payer: PPO

## 2015-01-29 DIAGNOSIS — E039 Hypothyroidism, unspecified: Secondary | ICD-10-CM | POA: Diagnosis not present

## 2015-01-29 DIAGNOSIS — Z1231 Encounter for screening mammogram for malignant neoplasm of breast: Secondary | ICD-10-CM | POA: Insufficient documentation

## 2015-01-29 DIAGNOSIS — E7800 Pure hypercholesterolemia, unspecified: Secondary | ICD-10-CM

## 2015-01-29 DIAGNOSIS — Z1239 Encounter for other screening for malignant neoplasm of breast: Secondary | ICD-10-CM

## 2015-01-29 DIAGNOSIS — K2100 Gastro-esophageal reflux disease with esophagitis, without bleeding: Secondary | ICD-10-CM

## 2015-01-29 DIAGNOSIS — Z0000 Encounter for general adult medical examination without abnormal findings: Secondary | ICD-10-CM | POA: Insufficient documentation

## 2015-01-29 LAB — LIPID PANEL
Cholesterol: 201 mg/dL — ABNORMAL HIGH (ref 0–200)
HDL: 59.1 mg/dL
LDL Cholesterol: 117 mg/dL — ABNORMAL HIGH (ref 0–99)
NonHDL: 142.3
Total CHOL/HDL Ratio: 3
Triglycerides: 129 mg/dL (ref 0.0–149.0)
VLDL: 25.8 mg/dL (ref 0.0–40.0)

## 2015-01-29 LAB — CBC WITH DIFFERENTIAL/PLATELET
Basophils Absolute: 0 10*3/uL (ref 0.0–0.1)
Basophils Relative: 0.4 % (ref 0.0–3.0)
Eosinophils Absolute: 0 10*3/uL (ref 0.0–0.7)
Eosinophils Relative: 0.6 % (ref 0.0–5.0)
HCT: 38.8 % (ref 36.0–46.0)
Hemoglobin: 13 g/dL (ref 12.0–15.0)
Lymphocytes Relative: 29.2 % (ref 12.0–46.0)
Lymphs Abs: 1.5 10*3/uL (ref 0.7–4.0)
MCHC: 33.6 g/dL (ref 30.0–36.0)
MCV: 96.4 fl (ref 78.0–100.0)
Monocytes Absolute: 0.4 10*3/uL (ref 0.1–1.0)
Monocytes Relative: 6.9 % (ref 3.0–12.0)
Neutro Abs: 3.3 10*3/uL (ref 1.4–7.7)
Neutrophils Relative %: 62.9 % (ref 43.0–77.0)
Platelets: 270 10*3/uL (ref 150.0–400.0)
RBC: 4.02 Mil/uL (ref 3.87–5.11)
RDW: 12.8 % (ref 11.5–15.5)
WBC: 5.3 10*3/uL (ref 4.0–10.5)

## 2015-01-29 LAB — COMPREHENSIVE METABOLIC PANEL WITH GFR
ALT: 16 U/L (ref 0–35)
AST: 21 U/L (ref 0–37)
Albumin: 4.5 g/dL (ref 3.5–5.2)
Alkaline Phosphatase: 70 U/L (ref 39–117)
BUN: 15 mg/dL (ref 6–23)
CO2: 29 meq/L (ref 19–32)
Calcium: 9.8 mg/dL (ref 8.4–10.5)
Chloride: 105 meq/L (ref 96–112)
Creatinine, Ser: 0.65 mg/dL (ref 0.40–1.20)
GFR: 95.81 mL/min
Glucose, Bld: 86 mg/dL (ref 70–99)
Potassium: 4.3 meq/L (ref 3.5–5.1)
Sodium: 140 meq/L (ref 135–145)
Total Bilirubin: 0.5 mg/dL (ref 0.2–1.2)
Total Protein: 7.1 g/dL (ref 6.0–8.3)

## 2015-01-29 LAB — TSH: TSH: 0.47 u[IU]/mL (ref 0.35–4.50)

## 2015-01-29 NOTE — Telephone Encounter (Signed)
Patient has requested a medication change due to her insurance only paying for the generic. -Thanks Form with information is in Dr. Bary Leriche box.-Thanks

## 2015-01-29 NOTE — Telephone Encounter (Signed)
Form in green folder. Formulary will no longer cover the name brand of Crestor 5 mg. They recommend changing to rosuvastatin 5 mg. Rx was already sent as a generic so I don't think there is anything we need to do.

## 2015-01-30 ENCOUNTER — Encounter: Payer: Self-pay | Admitting: *Deleted

## 2015-01-30 NOTE — Telephone Encounter (Signed)
Spoke with the patient, she is not out of her current supply of the brand name crestor but understands that when it is refilled through her mail order pharmacy that it will be the generic.

## 2015-01-30 NOTE — Telephone Encounter (Signed)
Notify pt that (per [facility]) we have sent in generic for crestor.  Let us know if any problems.

## 2015-02-20 ENCOUNTER — Ambulatory Visit: Payer: Medicare Other | Admitting: Internal Medicine

## 2015-03-26 ENCOUNTER — Ambulatory Visit (INDEPENDENT_AMBULATORY_CARE_PROVIDER_SITE_OTHER): Payer: PPO | Admitting: Internal Medicine

## 2015-03-26 ENCOUNTER — Encounter: Payer: Self-pay | Admitting: Internal Medicine

## 2015-03-26 VITALS — BP 110/70 | HR 63 | Temp 97.9°F | Resp 18 | Ht 63.0 in | Wt 151.2 lb

## 2015-03-26 DIAGNOSIS — Z860100 Personal history of colon polyps, unspecified: Secondary | ICD-10-CM

## 2015-03-26 DIAGNOSIS — Z23 Encounter for immunization: Secondary | ICD-10-CM | POA: Diagnosis not present

## 2015-03-26 DIAGNOSIS — F439 Reaction to severe stress, unspecified: Secondary | ICD-10-CM

## 2015-03-26 DIAGNOSIS — K2100 Gastro-esophageal reflux disease with esophagitis, without bleeding: Secondary | ICD-10-CM

## 2015-03-26 DIAGNOSIS — E7800 Pure hypercholesterolemia, unspecified: Secondary | ICD-10-CM | POA: Diagnosis not present

## 2015-03-26 DIAGNOSIS — E039 Hypothyroidism, unspecified: Secondary | ICD-10-CM | POA: Diagnosis not present

## 2015-03-26 DIAGNOSIS — Z658 Other specified problems related to psychosocial circumstances: Secondary | ICD-10-CM

## 2015-03-26 NOTE — Progress Notes (Signed)
Patient ID: [name], female   DOB: 1946-04-18, 69 y.o.   MRN: [id number]   Subjective:    Patient ID: [name], female    DOB: 1946/03/15, 69 y.o.   MRN: [id number]  HPI  Patient with past history of GERD, hypothyroidism and hypercholesterolemia.  She comes in today to follow up on these issues.  Her husband recently passed.  She is still grieving.  We discussed this at length.  She has been to counseling through hospice.  Plans to utilize this more.  Has good family support.  She recently had f/u EGD.  Had two benign polyps removed.  Currently acid reflux controlled.  No chest pain or tightness.  No sob.  No abdominal pain or cramping.  Bowels stable.  Trying to stay active.     Past Medical History  Diagnosis Date  . Hypothyroidism   . Environmental allergies   . Hypercholesterolemia   . Arthritis   . GERD (gastroesophageal reflux disease)   . Esophagitis     Barrets, followed by Dr Vennie Homans   Past Surgical History  Procedure Laterality Date  . Ankle surgery  1967  . Wrist surgery  1970    cyst removal  . Rhinoplasty  1986  . Knee arthroscopy  12/03/07    left  . Knee arthroscopy  06/25/08    left  . Replacement total knee  01/04/09    left  . Nasal sinus surgery  12/06  . Vaginal hysterectomy  1980    ovaries left in place   Family History  Problem Relation Age of Onset  . Heart disease Father   . Hyperlipidemia Paternal Grandmother   . Hyperlipidemia Paternal Grandfather   . Prostate cancer Maternal Uncle   . Pancreatic cancer Cousin   . Multiple myeloma Brother   . Breast cancer Maternal Aunt   . Breast cancer Maternal Aunt    Social History   Social History  . Marital Status: Married    Spouse Name: N/A  . Number of Children: N/A  . Years of Education: N/A   Social History Main Topics  . Smoking status: Former Smoker -- 5 years    Types: Cigarettes  . Smokeless tobacco: Never Used  . Alcohol Use: No  . Drug Use: No  . Sexual Activity: Not Asked    Other Topics Concern  . None   Social History Narrative    Outpatient Encounter Prescriptions as of 03/26/2015  Medication Sig  . acetaminophen (TYLENOL) 500 MG tablet Take 500 mg by mouth every 6 (six) hours as needed.  Marland Kitchen BIOTIN 5000 PO Take 1 tablet by mouth daily.  . busPIRone (BUSPAR) 5 MG tablet Take 1 tablet (5 mg total) by mouth 2 (two) times daily.  . Cholecalciferol (VITAMIN D PO) Take 5,000 Units by mouth daily.  . Coenzyme Q10 60 MG TABS Take 120 mg by mouth daily.  Marland Kitchen esomeprazole (NEXIUM) 40 MG capsule Take 40 mg by mouth daily as needed.   . Hypertonic Nasal Wash (SINUS RINSE NA) Place into the nose as needed.  . Lactobacillus (PROBIOTIC ACIDOPHILUS PO) Take 1,360 mg by mouth daily.  Marland Kitchen levothyroxine (LEVOTHROID) 75 MCG tablet Take 1 tablet (75 mcg total) by mouth every other day.  . levothyroxine (SYNTHROID, LEVOTHROID) 50 MCG tablet Take 1 tablet (50 mcg total) by mouth every other day.  . Multiple Vitamin (MULTIVITAMIN) LIQD Take 5 mLs by mouth daily.  . Omega-3 Fatty Acids (FISH OIL) 1360 MG CAPS  Take by mouth daily.  Marland Kitchen omeprazole (PRILOSEC) 40 MG capsule Take 40 mg by mouth daily.  . Probiotic Product (PROBIOTIC DAILY PO) Take 1 tablet by mouth.  . rosuvastatin (CRESTOR) 5 MG tablet Take 1 tablet (5 mg total) by mouth daily.  . vitamin E 400 UNIT capsule Take 400 Units by mouth daily.   No facility-administered encounter medications on file as of 03/26/2015.    Review of Systems  Constitutional: Negative for appetite change and unexpected weight change.  HENT: Negative for congestion and sinus pressure.   Eyes: Negative for discharge and visual disturbance.  Respiratory: Negative for cough, chest tightness and shortness of breath.   Cardiovascular: Negative for chest pain, palpitations and leg swelling.  Gastrointestinal: Negative for nausea, vomiting, abdominal pain and diarrhea.  Genitourinary: Negative for dysuria and difficulty urinating.  Musculoskeletal:  Negative for back pain and joint swelling.  Skin: Negative for color change and rash.  Neurological: Negative for dizziness, light-headedness and headaches.  Psychiatric/Behavioral: Negative for dysphoric mood and agitation.       Objective:    Physical Exam  Constitutional: She appears well-developed and well-nourished. No distress.  HENT:  Nose: Nose normal.  Mouth/Throat: Oropharynx is clear and moist.  Eyes: Conjunctivae are normal. Right eye exhibits no discharge. Left eye exhibits no discharge.  Neck: Neck supple. No thyromegaly present.  Cardiovascular: Normal rate and regular rhythm.   Pulmonary/Chest: Breath sounds normal. No respiratory distress. She has no wheezes.  Abdominal: Soft. Bowel sounds are normal. There is no tenderness.  Musculoskeletal: She exhibits no edema or tenderness.  Lymphadenopathy:    She has no cervical adenopathy.  Skin: No rash noted. No erythema.  Psychiatric: She has a normal mood and affect. Her behavior is normal.    BP 110/70 mmHg  Pulse 63  Temp(Src) 97.9 F (36.6 C) (Oral)  Resp 18  Ht 5' 3"  (1.6 m)  Wt 151 lb 4 oz (68.607 kg)  BMI 26.80 kg/m2  SpO2 99% Wt Readings from Last 3 Encounters:  03/26/15 151 lb 4 oz (68.607 kg)  11/17/14 151 lb (68.493 kg)  07/02/14 147 lb 8 oz (66.906 kg)     Lab Results  Component Value Date   WBC 5.3 01/29/2015   HGB 13.0 01/29/2015   HCT 38.8 01/29/2015   PLT 270.0 01/29/2015   GLUCOSE 86 01/29/2015   CHOL 201* 01/29/2015   TRIG 129.0 01/29/2015   HDL 59.10 01/29/2015   LDLDIRECT 123.4 02/27/2013   LDLCALC 117* 01/29/2015   ALT 16 01/29/2015   AST 21 01/29/2015   NA 140 01/29/2015   K 4.3 01/29/2015   CL 105 01/29/2015   CREATININE 0.65 01/29/2015   BUN 15 01/29/2015   CO2 29 01/29/2015   TSH 0.47 01/29/2015    Mm Digital Screening Bilateral  01/29/2015  CLINICAL DATA:  Screening. EXAM: DIGITAL SCREENING BILATERAL MAMMOGRAM WITH CAD COMPARISON:  Previous exam(s). ACR Breast  Density Category b: There are scattered areas of fibroglandular density. FINDINGS: There are no findings suspicious for malignancy. Images were processed with CAD. IMPRESSION: No mammographic evidence of malignancy. A result letter of this screening mammogram will be mailed directly to the patient. RECOMMENDATION: Screening mammogram in one year. (Code:SM-B-01Y) BI-RADS CATEGORY  1: Negative. Electronically Signed   By: Abelardo Diesel M.D.   On: 01/29/2015 13:56       Assessment & Plan:   Problem List Items Addressed This Visit    GERD (gastroesophageal reflux disease)    Symptoms controlled  on current regimen. Had EGD 12/2014 with two benign polyps removed.  Follow.        History of colonic polyps    Colonoscopy 05/06/13 - tubular adenoma (x2).  Recommend f/u in five years.        Hypercholesteremia    Low cholesterol diet and exercise.  On crestor.  Follow lipid panel and liver function tests.        Relevant Orders   Comprehensive metabolic panel   Lipid panel   Hypothyroidism    On thyroid replacement.  Follow tsh.       Stress    Increased stress with her husband's recent passing.  Discussed at length with her today.  She has buspar to take prn.  Plans to f/u with counseling through Hospice.         Other Visit Diagnoses    Encounter for immunization    -  Primary        Einar Pheasant, MD

## 2015-03-26 NOTE — Patient Instructions (Signed)

## 2015-03-26 NOTE — Progress Notes (Signed)
Pre-visit discussion using our clinic review tool. No additional management support is needed unless otherwise documented below in the visit note.  

## 2015-03-29 ENCOUNTER — Encounter: Payer: Self-pay | Admitting: Internal Medicine

## 2015-03-29 NOTE — Assessment & Plan Note (Signed)
Low cholesterol diet and exercise.  On crestor.  Follow lipid panel and liver function tests.  

## 2015-03-29 NOTE — Assessment & Plan Note (Signed)
Colonoscopy 05/06/13 - tubular adenoma (x2).  Recommend f/u in five years.

## 2015-03-29 NOTE — Assessment & Plan Note (Signed)
Symptoms controlled on current regimen. Had EGD 12/2014 with two benign polyps removed.  Follow.

## 2015-03-29 NOTE — Assessment & Plan Note (Signed)
On thyroid replacement.  Follow tsh.  

## 2015-03-29 NOTE — Assessment & Plan Note (Signed)
Increased stress with her husband's recent passing.  Discussed at length with her today.  She has buspar to take prn.  Plans to f/u with counseling through Hospice.

## 2015-04-16 ENCOUNTER — Ambulatory Visit: Payer: Medicare Other

## 2015-04-22 ENCOUNTER — Ambulatory Visit (INDEPENDENT_AMBULATORY_CARE_PROVIDER_SITE_OTHER): Payer: PPO

## 2015-04-22 DIAGNOSIS — Z23 Encounter for immunization: Secondary | ICD-10-CM | POA: Diagnosis not present

## 2015-04-22 NOTE — Progress Notes (Signed)
Patient came in for [place] immunization.  Received in left deltoid.  Patient tolerated well.

## 2015-04-30 ENCOUNTER — Encounter: Payer: Self-pay | Admitting: Internal Medicine

## 2015-05-20 ENCOUNTER — Other Ambulatory Visit: Payer: Self-pay

## 2015-05-20 MED ORDER — ROSUVASTATIN CALCIUM 5 MG PO TABS: 5.0000 mg | ORAL_TABLET | Freq: Every day | ORAL | Status: DC

## 2015-05-27 ENCOUNTER — Ambulatory Visit (INDEPENDENT_AMBULATORY_CARE_PROVIDER_SITE_OTHER): Payer: PPO

## 2015-05-27 VITALS — HR 63 | Temp 98.0°F | Resp 12 | Ht 63.0 in | Wt 150.0 lb

## 2015-05-27 DIAGNOSIS — Z13820 Encounter for screening for osteoporosis: Secondary | ICD-10-CM | POA: Diagnosis not present

## 2015-05-27 DIAGNOSIS — Z1159 Encounter for screening for other viral diseases: Secondary | ICD-10-CM | POA: Diagnosis not present

## 2015-05-27 DIAGNOSIS — Z0000 Encounter for general adult medical examination without abnormal findings: Secondary | ICD-10-CM | POA: Diagnosis not present

## 2015-05-27 NOTE — Progress Notes (Signed)
Subjective:   [name] is a 69 y.o. female who presents for an Initial Medicare Annual Wellness Visit.  Review of Systems    No ROS.  Medicare Wellness Visit.  Cardiac Risk Factors include: advanced age (>38mn, >>38women)     Objective:    Today's Vitals   05/27/15 1109 05/27/15 1112  Pulse: 63   Temp: 98 F (36.7 C)   TempSrc: Oral   Resp: 12   Height: 5' 3"  (1.6 m)   Weight: 150 lb (68.04 kg)   SpO2: 97%   PainSc:  6     Current Medications (verified) Outpatient Encounter Prescriptions as of 05/27/2015  Medication Sig  . acetaminophen (TYLENOL) 500 MG tablet Take 500 mg by mouth every 6 (six) hours as needed.  .Marland KitchenBIOTIN 5000 PO Take 1 tablet by mouth daily.  . busPIRone (BUSPAR) 5 MG tablet Take 1 tablet (5 mg total) by mouth 2 (two) times daily.  . Cholecalciferol (VITAMIN D PO) Take 5,000 Units by mouth daily.  . Coenzyme Q10 60 MG TABS Take 120 mg by mouth daily.  .Marland Kitchenesomeprazole (NEXIUM) 40 MG capsule Take 40 mg by mouth daily as needed.   . Hypertonic Nasal Wash (SINUS RINSE NA) Place into the nose as needed.  . Lactobacillus (PROBIOTIC ACIDOPHILUS PO) Take 1,360 mg by mouth daily.  .Marland Kitchenlevothyroxine (LEVOTHROID) 75 MCG tablet Take 1 tablet (75 mcg total) by mouth every other day.  . levothyroxine (SYNTHROID, LEVOTHROID) 50 MCG tablet Take 1 tablet (50 mcg total) by mouth every other day.  . Multiple Vitamin (MULTIVITAMIN) LIQD Take 5 mLs by mouth daily.  . Omega-3 Fatty Acids (FISH OIL) 1360 MG CAPS Take by mouth daily.  .Marland Kitchenomeprazole (PRILOSEC) 40 MG capsule Take 40 mg by mouth daily.  . Probiotic Product (PROBIOTIC DAILY PO) Take 1 tablet by mouth.  . rosuvastatin (CRESTOR) 5 MG tablet Take 1 tablet (5 mg total) by mouth daily.  . vitamin E 400 UNIT capsule Take 400 Units by mouth daily.   No facility-administered encounter medications on file as of 05/27/2015.    Allergies (verified) Diclofenac; Ibuprofen; Tramadol; Hydromorphone; and Oxycodone    History: Past Medical History  Diagnosis Date  . Hypothyroidism   . Environmental allergies   . Hypercholesterolemia   . Arthritis   . GERD (gastroesophageal reflux disease)   . Esophagitis     Barrets, followed by Dr NVennie Homans  Past Surgical History  Procedure Laterality Date  . Ankle surgery  1967  . Wrist surgery  1970    cyst removal  . Rhinoplasty  1986  . Knee arthroscopy  12/03/07    left  . Knee arthroscopy  06/25/08    left  . Replacement total knee  01/04/09    left  . Nasal sinus surgery  12/06  . Vaginal hysterectomy  1980    ovaries left in place   Family History  Problem Relation Age of Onset  . Heart disease Father   . Hyperlipidemia Paternal Grandmother   . Hyperlipidemia Paternal Grandfather   . Prostate cancer Maternal Uncle   . Pancreatic cancer Cousin   . Multiple myeloma Brother   . Breast cancer Maternal Aunt   . Breast cancer Maternal Aunt    Social History   Occupational History  . Not on file.   Social History Main Topics  . Smoking status: Former Smoker -- 5 years    Types: Cigarettes  . Smokeless tobacco: Never Used  .  Alcohol Use: No  . Drug Use: No  . Sexual Activity: Not Currently    Tobacco Counseling Counseling given: Not Answered   Activities of Daily Living In your present state of health, do you have any difficulty performing the following activities: 05/27/2015  Hearing? N  Vision? N  Difficulty concentrating or making decisions? N  Walking or climbing stairs? Y  Dressing or bathing? N  Doing errands, shopping? N  Preparing Food and eating ? N  Using the Toilet? N  In the past six months, have you accidently leaked urine? N  Do you have problems with loss of bowel control? Y  Managing your Medications? N  Managing your Finances? N  Housekeeping or managing your Housekeeping? N    Immunizations and Health Maintenance Immunization History  Administered Date(s) Administered  . Influenza Split 03/26/2012  .  Influenza,inj,Quad PF,36+ Mos 02/25/2013, 02/14/2014, 03/26/2015  . Pneumococcal Conjugate-13 04/22/2015  . Pneumococcal Polysaccharide-23 01/24/2011   Health Maintenance Due  Topic Date Due  . Hepatitis C Screening  10/08/1945  . TETANUS/TDAP  03/11/1964  . DEXA SCAN  03/11/2010    Patient Care Team: Einar Pheasant, MD as PCP - General (Internal Medicine)  Indicate any recent Medical Services you may have received from other than [facility] providers in the past year (date may be approximate).     Assessment:   This is a routine wellness examination for [name]. The goal of the wellness visit is to assist the patient how to close the gaps in care and create a preventative care plan for the patient.   VIT D Calcium as appropriate/ Osteoporosis risk reviewed. Future DEXA Scan order placed, per patient request.   Medications reviewed; taking without issues or barriers.  TDAP vaccine postponed, per patient request.  Education provided.  Hep C Screening future orders placed, per patient request.  Follow up in February.  Safety issues reviewed; smoke detectors in the home. No firearms in the home. Wears seatbelts when driving or riding with others. No violence in the home.  No identified risk were noted; The patient was oriented x 3; appropriate in dress and manner and no objective failures at ADL's or IADL's.   Patient Concerns: Intermittent watery bowel; no signs of bleeding. Onset 2 months ago, she believes it is food and/or stress related. Declined immediate follow up appointment.  Encouraged her to keep a log of diet, how often the episodes occur, monitor for blood and to follow up with PCP immediately if condition persists or worsens.  Request new Rx for Meloxicam;  Deferred to PCP for follow up.   Hearing/Vision screen Hearing Screening Comments: Passes the whisper test Vision Screening Comments: Followed by [facility], Dr. Glennon Mac Annual visits Wears  glasses/contacts  Dietary issues and exercise activities discussed: Current Exercise Habits:: Home exercise routine, Type of exercise: treadmill, Time (Minutes): 30, Frequency (Times/Week): 1, Weekly Exercise (Minutes/Week): 30, Intensity: Mild  Goals    . Increase physical activity     Increase treadmill use to 3 times a week for 30 minute sessions.  Begin yoga classes 2 times a week.        Depression Screen PHQ 2/9 Scores 05/27/2015 08/14/2012 08/13/2012  PHQ - 2 Score 1 0 0    Fall Risk Fall Risk  05/27/2015 08/14/2012 08/13/2012  Falls in the past year? No No No    Cognitive Function: MMSE - Mini Mental State Exam 05/27/2015  Orientation to time 5  Orientation to Place 5  Registration 3  Attention/ Calculation 5  Recall 3  Language- name 2 objects 2  Language- repeat 1  Language- follow 3 step command 3  Language- read & follow direction 1  Write a sentence 1  Copy design 1  Total score 30    Screening Tests Health Maintenance  Topic Date Due  . Hepatitis C Screening  11/29/1945  . TETANUS/TDAP  03/11/1964  . DEXA SCAN  03/11/2010  . INFLUENZA VACCINE  01/12/2016  . MAMMOGRAM  01/29/2016  . COLONOSCOPY  05/07/2023  . ZOSTAVAX  Addressed  . PNA vac Low Risk Adult  Completed      Plan:   End of life planning; Advance aging; Advanced directives discussed. Living Will complete. Copy requested.  Return in February for fasting labs and scheduled follow up with Dr. Nicki Reaper.  DEXA Scan as directed.  During the course of the visit, [name] was educated and counseled about the following appropriate screening and preventive services:   Vaccines to include Pneumoccal, Influenza, Hepatitis B, Td, Zostavax, HCV  Electrocardiogram  Cardiovascular disease screening  Colorectal cancer screening  Bone density screening  Diabetes screening  Glaucoma screening  Mammography/PAP  Nutrition counseling  Smoking cessation counseling  Patient Instructions (the written  plan) were given to the patient.    Varney Biles, LPN   22/24/1146    Reviewed above information.  Agree with plan.   Dr Nicki Reaper

## 2015-05-27 NOTE — Patient Instructions (Addendum)
[name],  Thank you for taking time to come for your Medicare Wellness Visit.  I appreciate your ongoing commitment to your health goals. Please review the following plan we discussed and let me know if I can assist you in the future.  Return in February for labs and scheduled follow up with Dr. Nicki Reaper  DEXA Scan as directed  Bone Densitometry Bone densitometry is an imaging test that uses a special X-ray to measure the amount of calcium and other minerals in your bones (bone density). This test is also known as a bone mineral density test or dual-energy X-ray absorptiometry (DXA). The test can measure bone density at your hip and your spine. It is similar to having a regular X-ray. You may have this test to:  Diagnose a condition that causes weak or thin bones (osteoporosis).  Predict your risk of a broken bone (fracture).  Determine how well osteoporosis treatment is working. LET [facility] CARE PROVIDER KNOW ABOUT:  Any allergies you have.  All medicines you are taking, including vitamins, herbs, eye drops, creams, and over-the-counter medicines.  Previous problems you or members of your family have had with the use of anesthetics.  Any blood disorders you have.  Previous surgeries you have had.  Medical conditions you have.  Possibility of pregnancy.  Any other medical test you had within the previous 14 days that used contrast material. RISKS AND COMPLICATIONS Generally, this is a safe procedure. However, problems can occur and may include the following:  This test exposes you to a very small amount of radiation.  The risks of radiation exposure may be greater to unborn children. BEFORE THE PROCEDURE  Do not take any calcium supplements for 24 hours before having the test. You can otherwise eat and drink what you usually do.  Take off all metal jewelry, eyeglasses, dental appliances, and any other metal objects. PROCEDURE  You may lie on an exam table. There will  be an X-ray generator below you and an imaging device above you.  Other devices, such as boxes or braces, may be used to position your body properly for the scan.  You will need to lie still while the machine slowly scans your body.  The images will show up on a computer monitor. AFTER THE PROCEDURE You may need more testing at a later time.   This information is not intended to replace advice given to you by your health care provider. Make sure you discuss any questions you have with your health care provider.   Document Released: 06/21/2004 Document Revised: 06/20/2014 Document Reviewed: 11/07/2013 Elsevier Interactive Patient Education 2016 [place] Maintenance, Female Adopting a healthy lifestyle and getting preventive care can go a long way to promote health and wellness. Talk with your health care provider about what schedule of regular examinations is right for you. This is a good chance for you to check in with your provider about disease prevention and staying healthy. In between checkups, there are plenty of things you can do on your own. Experts have done a lot of research about which lifestyle changes and preventive measures are most likely to keep you healthy. Ask your health care provider for more information. WEIGHT AND DIET  Eat a healthy diet  Be sure to include plenty of vegetables, fruits, low-fat dairy products, and lean protein.  Do not eat a lot of foods high in solid fats, added sugars, or salt.  Get regular exercise. This is one of the most  important things you can do for your health.  Most adults should exercise for at least 150 minutes each week. The exercise should increase your heart rate and make you sweat (moderate-intensity exercise).  Most adults should also do strengthening exercises at least twice a week. This is in addition to the moderate-intensity exercise.  Maintain a healthy weight  Body mass index (BMI) is a measurement that can  be used to identify possible weight problems. It estimates body fat based on height and weight. Your health care provider can help determine your BMI and help you achieve or maintain a healthy weight.  For females 56 years of age and older:   A BMI below 18.5 is considered underweight.  A BMI of 18.5 to 24.9 is normal.  A BMI of 25 to 29.9 is considered overweight.  A BMI of 30 and above is considered obese.  Watch levels of cholesterol and blood lipids  You should start having your blood tested for lipids and cholesterol at 70 years of age, then have this test every 5 years.  You may need to have your cholesterol levels checked more often if:  Your lipid or cholesterol levels are high.  You are older than 70 years of age.  You are at high risk for heart disease.  CANCER SCREENING   Lung Cancer  Lung cancer screening is recommended for adults 60-56 years old who are at high risk for lung cancer because of a history of smoking.  A yearly low-dose CT scan of the lungs is recommended for people who:  Currently smoke.  Have quit within the past 15 years.  Have at least a 30-pack-year history of smoking. A pack year is smoking an average of one pack of cigarettes a day for 1 year.  Yearly screening should continue until it has been 15 years since you quit.  Yearly screening should stop if you develop a health problem that would prevent you from having lung cancer treatment.  Breast Cancer  Practice breast self-awareness. This means understanding how your breasts normally appear and feel.  It also means doing regular breast self-exams. Let your health care provider know about any changes, no matter how small.  If you are in your 20s or 30s, you should have a clinical breast exam (CBE) by a health care provider every 1-3 years as part of a regular health exam.  If you are 23 or older, have a CBE every year. Also consider having a breast X-ray (mammogram) every year.  If  you have a family history of breast cancer, talk to your health care provider about genetic screening.  If you are at high risk for breast cancer, talk to your health care provider about having an MRI and a mammogram every year.  Breast cancer gene (BRCA) assessment is recommended for women who have family members with BRCA-related cancers. BRCA-related cancers include:  Breast.  Ovarian.  Tubal.  Peritoneal cancers.  Results of the assessment will determine the need for genetic counseling and BRCA1 and BRCA2 testing. Cervical Cancer Your health care provider may recommend that you be screened regularly for cancer of the pelvic organs (ovaries, uterus, and vagina). This screening involves a pelvic examination, including checking for microscopic changes to the surface of your cervix (Pap test). You may be encouraged to have this screening done every 3 years, beginning at age 95.  For women ages 30-65, health care providers may recommend pelvic exams and Pap testing every 3 years, or they may  recommend the Pap and pelvic exam, combined with testing for human papilloma virus (HPV), every 5 years. Some types of HPV increase your risk of cervical cancer. Testing for HPV may also be done on women of any age with unclear Pap test results.  Other health care providers may not recommend any screening for nonpregnant women who are considered low risk for pelvic cancer and who do not have symptoms. Ask your health care provider if a screening pelvic exam is right for you.  If you have had past treatment for cervical cancer or a condition that could lead to cancer, you need Pap tests and screening for cancer for at least 20 years after your treatment. If Pap tests have been discontinued, your risk factors (such as having a new sexual partner) need to be reassessed to determine if screening should resume. Some women have medical problems that increase the chance of getting cervical cancer. In these cases,  your health care provider may recommend more frequent screening and Pap tests. Colorectal Cancer  This type of cancer can be detected and often prevented.  Routine colorectal cancer screening usually begins at 70 years of age and continues through 70 years of age.  Your health care provider may recommend screening at an earlier age if you have risk factors for colon cancer.  Your health care provider may also recommend using home test kits to check for hidden blood in the stool.  A small camera at the end of a tube can be used to examine your colon directly (sigmoidoscopy or colonoscopy). This is done to check for the earliest forms of colorectal cancer.  Routine screening usually begins at age 58.  Direct examination of the colon should be repeated every 5-10 years through 70 years of age. However, you may need to be screened more often if early forms of precancerous polyps or small growths are found. Skin Cancer  Check your skin from head to toe regularly.  Tell your health care provider about any new moles or changes in moles, especially if there is a change in a mole's shape or color.  Also tell your health care provider if you have a mole that is larger than the size of a pencil eraser.  Always use sunscreen. Apply sunscreen liberally and repeatedly throughout the day.  Protect yourself by wearing long sleeves, pants, a wide-brimmed hat, and sunglasses whenever you are outside. HEART DISEASE, DIABETES, AND HIGH BLOOD PRESSURE   High blood pressure causes heart disease and increases the risk of stroke. High blood pressure is more likely to develop in:  People who have blood pressure in the high end of the normal range (130-139/85-89 mm Hg).  People who are overweight or obese.  People who are African American.  If you are 44-47 years of age, have your blood pressure checked every 3-5 years. If you are 53 years of age or older, have your blood pressure checked every year. You  should have your blood pressure measured twice--once when you are at a hospital or clinic, and once when you are not at a hospital or clinic. Record the average of the two measurements. To check your blood pressure when you are not at a hospital or clinic, you can use:  An automated blood pressure machine at a pharmacy.  A home blood pressure monitor.  If you are between 67 years and 53 years old, ask your health care provider if you should take aspirin to prevent strokes.  Have regular diabetes screenings.  This involves taking a blood sample to check your fasting blood sugar level.  If you are at a normal weight and have a low risk for diabetes, have this test once every three years after 70 years of age.  If you are overweight and have a high risk for diabetes, consider being tested at a younger age or more often. PREVENTING INFECTION  Hepatitis B  If you have a higher risk for hepatitis B, you should be screened for this virus. You are considered at high risk for hepatitis B if:  You were born in a country where hepatitis B is common. Ask your health care provider which countries are considered high risk.  Your parents were born in a high-risk country, and you have not been immunized against hepatitis B (hepatitis B vaccine).  You have HIV or AIDS.  You use needles to inject street drugs.  You live with someone who has hepatitis B.  You have had sex with someone who has hepatitis B.  You get hemodialysis treatment.  You take certain medicines for conditions, including cancer, organ transplantation, and autoimmune conditions. Hepatitis C  Blood testing is recommended for:  Everyone born from [birth year] through 1966.  Anyone with known risk factors for hepatitis C. Sexually transmitted infections (STIs)  You should be screened for sexually transmitted infections (STIs) including gonorrhea and chlamydia if:  You are sexually active and are younger than 70 years of age.  You  are older than 70 years of age and your health care provider tells you that you are at risk for this type of infection.  Your sexual activity has changed since you were last screened and you are at an increased risk for chlamydia or gonorrhea. Ask your health care provider if you are at risk.  If you do not have HIV, but are at risk, it may be recommended that you take a prescription medicine daily to prevent HIV infection. This is called pre-exposure prophylaxis (PrEP). You are considered at risk if:  You are sexually active and do not regularly use condoms or know the HIV status of your partner(s).  You take drugs by injection.  You are sexually active with a partner who has HIV. Talk with your health care provider about whether you are at high risk of being infected with HIV. If you choose to begin PrEP, you should first be tested for HIV. You should then be tested every 3 months for as long as you are taking PrEP.  PREGNANCY   If you are premenopausal and you may become pregnant, ask your health care provider about preconception counseling.  If you may become pregnant, take 400 to 800 micrograms (mcg) of folic acid every day.  If you want to prevent pregnancy, talk to your health care provider about birth control (contraception). OSTEOPOROSIS AND MENOPAUSE   Osteoporosis is a disease in which the bones lose minerals and strength with aging. This can result in serious bone fractures. Your risk for osteoporosis can be identified using a bone density scan.  If you are 29 years of age or older, or if you are at risk for osteoporosis and fractures, ask your health care provider if you should be screened.  Ask your health care provider whether you should take a calcium or vitamin D supplement to lower your risk for osteoporosis.  Menopause may have certain physical symptoms and risks.  Hormone replacement therapy may reduce some of these symptoms and risks. Talk to your health care  provider  about whether hormone replacement therapy is right for you.  HOME CARE INSTRUCTIONS   Schedule regular health, dental, and eye exams.  Stay current with your immunizations.   Do not use any tobacco products including cigarettes, chewing tobacco, or electronic cigarettes.  If you are pregnant, do not drink alcohol.  If you are breastfeeding, limit how much and how often you drink alcohol.  Limit alcohol intake to no more than 1 drink per day for nonpregnant women. One drink equals 12 ounces of beer, 5 ounces of wine, or 1 ounces of hard liquor.  Do not use street drugs.  Do not share needles.  Ask your health care provider for help if you need support or information about quitting drugs.  Tell your health care provider if you often feel depressed.  Tell your health care provider if you have ever been abused or do not feel safe at home.   This information is not intended to replace advice given to you by your health care provider. Make sure you discuss any questions you have with your health care provider.   Document Released: 12/13/2010 Document Revised: 06/20/2014 Document Reviewed: 05/01/2013 Elsevier Interactive Patient Education [employer].

## 2015-06-11 ENCOUNTER — Encounter: Payer: Self-pay | Admitting: Internal Medicine

## 2015-06-17 ENCOUNTER — Other Ambulatory Visit: Payer: Self-pay

## 2015-06-17 MED ORDER — LEVOTHYROXINE SODIUM 50 MCG PO TABS: 50.0000 ug | ORAL_TABLET | ORAL | Status: DC

## 2015-06-17 MED ORDER — BUSPIRONE HCL 5 MG PO TABS: 5.0000 mg | ORAL_TABLET | Freq: Two times a day (BID) | ORAL | Status: DC

## 2015-06-17 MED ORDER — LEVOTHYROXINE SODIUM 75 MCG PO TABS: 75.0000 ug | ORAL_TABLET | ORAL | Status: DC

## 2015-07-06 ENCOUNTER — Ambulatory Visit
Admission: RE | Admit: 2015-07-06 | Discharge: 2015-07-06 | Disposition: A | Discharge status: Home / Self Care | Payer: PPO | Source: Ambulatory Visit | Attending: Internal Medicine | Admitting: Internal Medicine

## 2015-07-06 DIAGNOSIS — M8588 Other specified disorders of bone density and structure, other site: Secondary | ICD-10-CM | POA: Diagnosis not present

## 2015-07-06 DIAGNOSIS — Z13820 Encounter for screening for osteoporosis: Secondary | ICD-10-CM

## 2015-07-06 DIAGNOSIS — M8580 Other specified disorders of bone density and structure, unspecified site: Secondary | ICD-10-CM | POA: Insufficient documentation

## 2015-07-07 ENCOUNTER — Encounter: Payer: Self-pay | Admitting: Internal Medicine

## 2015-07-10 NOTE — Telephone Encounter (Signed)
Unread mychart message mailed to patient 

## 2015-07-27 ENCOUNTER — Other Ambulatory Visit (INDEPENDENT_AMBULATORY_CARE_PROVIDER_SITE_OTHER): Payer: PPO

## 2015-07-27 DIAGNOSIS — E7800 Pure hypercholesterolemia, unspecified: Secondary | ICD-10-CM | POA: Diagnosis not present

## 2015-07-27 DIAGNOSIS — Z1159 Encounter for screening for other viral diseases: Secondary | ICD-10-CM | POA: Diagnosis not present

## 2015-07-27 LAB — COMPREHENSIVE METABOLIC PANEL WITH GFR
ALT: 17 U/L (ref 0–35)
AST: 19 U/L (ref 0–37)
Albumin: 4.5 g/dL (ref 3.5–5.2)
Alkaline Phosphatase: 71 U/L (ref 39–117)
BUN: 14 mg/dL (ref 6–23)
CO2: 30 meq/L (ref 19–32)
Calcium: 9.6 mg/dL (ref 8.4–10.5)
Chloride: 106 meq/L (ref 96–112)
Creatinine, Ser: 0.65 mg/dL (ref 0.40–1.20)
GFR: 95.67 mL/min
Glucose, Bld: 94 mg/dL (ref 70–99)
Potassium: 4 meq/L (ref 3.5–5.1)
Sodium: 143 meq/L (ref 135–145)
Total Bilirubin: 0.5 mg/dL (ref 0.2–1.2)
Total Protein: 6.8 g/dL (ref 6.0–8.3)

## 2015-07-27 LAB — LIPID PANEL
Cholesterol: 227 mg/dL — ABNORMAL HIGH (ref 0–200)
HDL: 59.2 mg/dL
LDL Cholesterol: 138 mg/dL — ABNORMAL HIGH (ref 0–99)
NonHDL: 167.99
Total CHOL/HDL Ratio: 4
Triglycerides: 151 mg/dL — ABNORMAL HIGH (ref 0.0–149.0)
VLDL: 30.2 mg/dL (ref 0.0–40.0)

## 2015-07-28 ENCOUNTER — Encounter: Payer: Self-pay | Admitting: Internal Medicine

## 2015-07-28 LAB — HEPATITIS C ANTIBODY: HCV Ab: NEGATIVE

## 2015-07-30 ENCOUNTER — Ambulatory Visit (INDEPENDENT_AMBULATORY_CARE_PROVIDER_SITE_OTHER): Payer: PPO | Admitting: Internal Medicine

## 2015-07-30 ENCOUNTER — Encounter: Payer: Self-pay | Admitting: Internal Medicine

## 2015-07-30 VITALS — BP 116/62 | HR 60 | Temp 97.1°F | Resp 12 | Ht 63.0 in | Wt 151.0 lb

## 2015-07-30 DIAGNOSIS — Z658 Other specified problems related to psychosocial circumstances: Secondary | ICD-10-CM

## 2015-07-30 DIAGNOSIS — E7800 Pure hypercholesterolemia, unspecified: Secondary | ICD-10-CM

## 2015-07-30 DIAGNOSIS — F439 Reaction to severe stress, unspecified: Secondary | ICD-10-CM

## 2015-07-30 DIAGNOSIS — Z860100 Personal history of colon polyps, unspecified: Secondary | ICD-10-CM

## 2015-07-30 DIAGNOSIS — K2100 Gastro-esophageal reflux disease with esophagitis, without bleeding: Secondary | ICD-10-CM

## 2015-07-30 DIAGNOSIS — E039 Hypothyroidism, unspecified: Secondary | ICD-10-CM | POA: Diagnosis not present

## 2015-07-30 DIAGNOSIS — R197 Diarrhea, unspecified: Secondary | ICD-10-CM | POA: Diagnosis not present

## 2015-07-30 LAB — TSH: TSH: 0.82 u[IU]/mL (ref 0.35–4.50)

## 2015-07-30 MED ORDER — OMEPRAZOLE 20 MG PO CPDR: 20.0000 mg | DELAYED_RELEASE_CAPSULE | Freq: Every day | ORAL | Status: DC

## 2015-07-30 NOTE — Progress Notes (Signed)
Patient ID: [name], female   DOB: [birth year], 70 y.o.   MRN: [id number]   Subjective:    Patient ID: [name], female    DOB: 01-Jul-1945, 70 y.o.   MRN: [id number]  HPI  Patient with past history of hypercholesterolemia, GERD and hypothyroidism.  She comes in today to follow up on these issues.  She is still dealing with the stress of her husband passing.  She does feel she is doing better.  She reports diarrhea.  Will have 4-5 stools per day.  Loose, watery stool.  She is taking various vitamin supplements.  Taking probiotic.  No abdominal pain.  No nausea or vomiting.  No acid reflux.  Is on 107m of prilosec per day.  No chest pain or tightness.  No sob.  No increased cough or congestion.     Past Medical History  Diagnosis Date  . Hypothyroidism   . Environmental allergies   . Hypercholesterolemia   . Arthritis   . GERD (gastroesophageal reflux disease)   . Esophagitis     [facility]rets, followed by Dr NVennie Homans  Past Surgical History  Procedure Laterality Date  . Ankle surgery  1967  . Wrist surgery  1970    cyst removal  . Rhinoplasty  1986  . Knee arthroscopy  12/03/07    left  . Knee arthroscopy  06/25/08    left  . Replacement total knee  01/04/09    left  . Nasal sinus surgery  12/06  . Vaginal hysterectomy  1980    ovaries left in place   Family History  Problem Relation Age of Onset  . Heart disease Father   . Hyperlipidemia Paternal Grandmother   . Hyperlipidemia Paternal Grandfather   . Prostate cancer Maternal Uncle   . Pancreatic cancer Cousin   . Multiple myeloma Brother   . Breast cancer Maternal Aunt   . Breast cancer Maternal Aunt    Social History   Social History  . Marital Status: Married    Spouse Name: N/A  . Number of Children: N/A  . Years of Education: N/A   Social History Main Topics  . Smoking status: Former Smoker -- 5 years    Types: Cigarettes  . Smokeless tobacco: Never Used  . Alcohol Use: No  . Drug Use: No  . Sexual  Activity: Not Currently   Other Topics Concern  . None   Social History Narrative    Outpatient Encounter Prescriptions as of 07/30/2015  Medication Sig  . acetaminophen (TYLENOL) 500 MG tablet Take 500 mg by mouth every 6 (six) hours as needed.  .Marland KitchenBIOTIN 5000 PO Take 1 tablet by mouth daily.  . busPIRone (BUSPAR) 5 MG tablet Take 1 tablet (5 mg total) by mouth 2 (two) times daily.  . Cholecalciferol (VITAMIN D PO) Take 5,000 Units by mouth daily.  . Coenzyme Q10 60 MG TABS Take 120 mg by mouth daily.  .Marland Kitchenesomeprazole (NEXIUM) 40 MG capsule Take 40 mg by mouth daily as needed.   . Hypertonic Nasal Wash (SINUS RINSE NA) Place into the nose as needed.  . Lactobacillus (PROBIOTIC ACIDOPHILUS PO) Take 1,360 mg by mouth daily.  .Marland Kitchenlevothyroxine (LEVOTHROID) 75 MCG tablet Take 1 tablet (75 mcg total) by mouth every other day.  . levothyroxine (SYNTHROID, LEVOTHROID) 50 MCG tablet Take 1 tablet (50 mcg total) by mouth every other day.  . Multiple Vitamin (MULTIVITAMIN) LIQD Take 5 mLs by mouth daily.  . Omega-3 Fatty  Acids (FISH OIL) 1360 MG CAPS Take by mouth daily.  . Probiotic Product (PROBIOTIC DAILY PO) Take 1 tablet by mouth.  . rosuvastatin (CRESTOR) 5 MG tablet Take 1 tablet (5 mg total) by mouth daily.  . vitamin E 400 UNIT capsule Take 400 Units by mouth daily.  . [DISCONTINUED] omeprazole (PRILOSEC) 40 MG capsule Take 40 mg by mouth daily.  Marland Kitchen omeprazole (PRILOSEC) 20 MG capsule Take 1 capsule (20 mg total) by mouth daily.   No facility-administered encounter medications on file as of 07/30/2015.    Review of Systems  Constitutional: Negative for appetite change and unexpected weight change.  HENT: Negative for congestion and sinus pressure.   Respiratory: Negative for cough, chest tightness and shortness of breath.   Cardiovascular: Negative for chest pain, palpitations and leg swelling.  Gastrointestinal: Positive for diarrhea. Negative for nausea, vomiting and abdominal pain.    Genitourinary: Negative for dysuria and difficulty urinating.  Musculoskeletal: Negative for back pain and joint swelling.  Skin: Negative for color change and rash.  Neurological: Negative for dizziness, light-headedness and headaches.  Psychiatric/Behavioral: Negative for dysphoric mood and agitation.       Objective:    Physical Exam  Constitutional: She appears well-developed and well-nourished. No distress.  HENT:  Nose: Nose normal.  Mouth/Throat: Oropharynx is clear and moist.  Eyes: Conjunctivae are normal. Right eye exhibits no discharge. Left eye exhibits no discharge.  Neck: Neck supple. No thyromegaly present.  Cardiovascular: Normal rate and regular rhythm.   Pulmonary/Chest: Breath sounds normal. No respiratory distress. She has no wheezes.  Abdominal: Soft. Bowel sounds are normal. There is no tenderness.  Musculoskeletal: She exhibits no edema or tenderness.  Lymphadenopathy:    She has no cervical adenopathy.  Skin: No rash noted. No erythema.  Psychiatric: She has a normal mood and affect. Her behavior is normal.    BP 116/62 mmHg  Pulse 60  Temp(Src) 97.1 F (36.2 C) (Oral)  Resp 12  Ht 5' 3"  (1.6 m)  Wt 151 lb (68.493 kg)  BMI 26.76 kg/m2  SpO2 97% Wt Readings from Last 3 Encounters:  07/30/15 151 lb (68.493 kg)  05/27/15 150 lb (68.04 kg)  03/26/15 151 lb 4 oz (68.607 kg)     Lab Results  Component Value Date   WBC 5.3 01/29/2015   HGB 13.0 01/29/2015   HCT 38.8 01/29/2015   PLT 270.0 01/29/2015   GLUCOSE 94 07/27/2015   CHOL 227* 07/27/2015   TRIG 151.0* 07/27/2015   HDL 59.20 07/27/2015   LDLDIRECT 123.4 02/27/2013   LDLCALC 138* 07/27/2015   ALT 17 07/27/2015   AST 19 07/27/2015   NA 143 07/27/2015   K 4.0 07/27/2015   CL 106 07/27/2015   CREATININE 0.65 07/27/2015   BUN 14 07/27/2015   CO2 30 07/27/2015   TSH 0.82 07/30/2015    Dexascan  07/06/2015  EXAM: DUAL X-RAY ABSORPTIOMETRY (DXA) FOR BONE MINERAL DENSITY IMPRESSION:  Dear Dr. Nicki Reaper, Your patient [name] completed a BMD test on 07/06/2015 using the [place] (analysis version: 14.10) manufactured by [employer]. The following summarizes the results of our evaluation. PATIENT BIOGRAPHICAL: Name: Jelina, Paulsen Patient ID: [id number] Birth Date: 1945-08-31 Height: 63.0 in. Gender: Female Exam Date: 07/06/2015 Weight: 150.0 lbs. Indications: Caucasian, Hypothyroid, Hysterectomy, Oophorectomy Unilateral, History of Fracture (Adult) Fractures: Left foot, Left lower leg, Right foot Treatments: Vitamin D ASSESSMENT: The BMD measured at Femur Neck Left is 0.736 g/cm2 with a T-score of -2.2.  This patient is considered osteopenic according to [facility]) criteria. Site Region Measured Measured WHO Young Adult BMD Date       Age      Classification T-score AP Spine L1-L4 07/06/2015 70.3 Osteopenia -2.0 0.941 g/cm2 DualFemur Neck Left 07/06/2015 70.3 Osteopenia -2.2 0.736 g/cm2 [facility]) criteria for post-menopausal, Caucasian Women: Normal:       T-score at or above -1 SD Osteopenia:   T-score between -1 and -2.5 SD Osteoporosis: T-score at or below -2.5 SD RECOMMENDATIONS: [facility] recommends that FDA-approved medical therapies be considered in postmenopausal women and men age 32 or older with a: 1. Hip or vertebral (clinical or morphometric) fracture. 2. T-score of < -2.5 at the spine or hip. 3. Ten-year fracture probability by FRAX of 3% or greater for hip fracture or 20% or greater for major osteoporotic fracture. All treatment decisions require clinical judgment and consideration of individual patient factors, including patient preferences, co-morbidities, previous drug use, risk factors not captured in the FRAX model (e.g. falls, vitamin D deficiency, increased bone turnover, interval significant decline in bone density) and possible under - or over-estimation of fracture risk by FRAX. All patients  should ensure an adequate intake of dietary calcium (1200 mg/d) and vitamin D (800 IU daily) unless contraindicated. FOLLOW-UP: People with diagnosed cases of osteoporosis or at high risk for fracture should have regular bone mineral density tests. For patients eligible for Medicare, routine testing is allowed once every 2 years. The testing frequency can be increased to one year for patients who have rapidly progressing disease, those who are receiving or discontinuing medical therapy to restore bone mass, or have additional risk factors. I have reviewed this report, and agree with the above findings. [facility] Dear Dr. Nicki Reaper, Your patient TAWAN CORKERN completed a FRAX assessment on 07/06/2015 using the [place] (analysis version: 14.10) manufactured by [employer]. The following summarizes the results of our evaluation. PATIENT BIOGRAPHICAL: Name: Aurielle, S[place]land Patient ID: [id number] Birth Date: [birth year] Height:    63.0 in. Gender:     Female    Age:        69.3       Weight:    150.0 lbs. Ethnicity:  White                            Exam Date: 07/06/2015 FRAX* RESULTS:  (version: 3.5) 10-year Probability of Fracture1 Major Osteoporotic Fracture2 Hip Fracture 20.2% 4.3% Population: [place] (Caucasian) Risk Factors: History of Fracture (Adult) Based on Femur (Left) Neck BMD 1 -The 10-year probability of fracture may be lower than reported if the patient has received treatment. 2 -Major Osteoporotic Fracture: Clinical Spine, Forearm, Hip or Shoulder *FRAX is a Materials engineer of the [employer] for Metabolic Bone Disease, a [place] (WHO) [facility]. ASSESSMENT: The probability of a major osteoporotic fracture is 20.2% within the next ten years. The probability of a hip fracture is 4.3% within the next ten years. . Electronically Signed   By: David  Martinique M.D.   On: 07/06/2015 13:33       Assessment & Plan:   Problem List Items  Addressed This Visit    Diarrhea - Primary    Persistent loose watery stool.  Check stool studies.  Continue probiotic.  Decreased vitamin D.  Stop vitamin supplements.  Follow.        Relevant  Orders   Stool C-Diff Toxin Assay   Clostridium difficile EIA   Stool culture   Ova and parasite examination   GERD (gastroesophageal reflux disease)    Symptoms controlled on current regimen.  Had EGD 12/2014 with two benign polyps.  Will try to decrease prilosec to 66m q day.        Relevant Medications   omeprazole (PRILOSEC) 20 MG capsule   History of colonic polyps    Had colonoscopy 05/06/13.  Recommended f/u in five years.        Hypercholesteremia    On crestor.  Follow lipid panel and liver function tests.  Low cholesterol diet and exercise.        Hypothyroidism    On thyroid replacement.  Follow tsh.        Relevant Orders   TSH (Completed)   Stress    Increased stress with her husband's passing.  Overall feels she is doing better.  Follow.            SEinar Pheasant MD

## 2015-07-30 NOTE — Progress Notes (Signed)
Pre visit review using our clinic review tool, if applicable. No additional management support is needed unless otherwise documented below in the visit note. 

## 2015-07-31 ENCOUNTER — Encounter: Payer: Self-pay | Admitting: Internal Medicine

## 2015-07-31 NOTE — Telephone Encounter (Signed)
Pt came in today & asked about her Crestor. She seemed confused on how she is suppose to be taking it. Pt wants to know if she needs to take it once a day or twice a day. Bottle & med list show QD. Please advise.

## 2015-07-31 NOTE — Telephone Encounter (Signed)
Continue on 5mg  q day for now.  We will follow.

## 2015-07-31 NOTE — Telephone Encounter (Signed)
Pt.notified

## 2015-08-02 ENCOUNTER — Encounter: Payer: Self-pay | Admitting: Internal Medicine

## 2015-08-02 DIAGNOSIS — R195 Other fecal abnormalities: Secondary | ICD-10-CM | POA: Insufficient documentation

## 2015-08-02 DIAGNOSIS — R197 Diarrhea, unspecified: Secondary | ICD-10-CM | POA: Insufficient documentation

## 2015-08-02 NOTE — Assessment & Plan Note (Signed)
Persistent loose watery stool.  Check stool studies.  Continue probiotic.  Decreased vitamin D.  Stop vitamin supplements.  Follow.

## 2015-08-02 NOTE — Assessment & Plan Note (Signed)
On thyroid replacement.  Follow tsh.  

## 2015-08-02 NOTE — Assessment & Plan Note (Signed)
Had colonoscopy 05/06/13.  Recommended f/u in five years.

## 2015-08-02 NOTE — Assessment & Plan Note (Signed)
Increased stress with her husband's passing.  Overall feels she is doing better.  Follow.

## 2015-08-02 NOTE — Assessment & Plan Note (Signed)
On crestor. Follow lipid panel and liver function tests.  Low cholesterol diet and exercise.  

## 2015-08-02 NOTE — Assessment & Plan Note (Signed)
Symptoms controlled on current regimen.  Had EGD 12/2014 with two benign polyps.  Will try to decrease prilosec to 20mg  q day.

## 2015-08-03 DIAGNOSIS — H2513 Age-related nuclear cataract, bilateral: Secondary | ICD-10-CM | POA: Diagnosis not present

## 2015-08-04 DIAGNOSIS — R197 Diarrhea, unspecified: Secondary | ICD-10-CM | POA: Diagnosis not present

## 2015-08-06 NOTE — Telephone Encounter (Signed)
Unread mychart message mailed to patient 

## 2015-08-10 DIAGNOSIS — H02423 Myogenic ptosis of bilateral eyelids: Secondary | ICD-10-CM | POA: Diagnosis not present

## 2015-08-11 ENCOUNTER — Telehealth: Payer: Self-pay

## 2015-08-11 NOTE — Telephone Encounter (Signed)
Please contact lab to get results.

## 2015-08-11 NOTE — Telephone Encounter (Signed)
[name] did not collect the C-diff specimen and the lab stated that it was too late to add it. Please advise, thanks

## 2015-08-11 NOTE — Telephone Encounter (Signed)
Pt called requesting test results from a stool sample she left in the lab. Pt's labs show collected but there are no test results. Please advise, thanks

## 2015-08-11 NOTE — Telephone Encounter (Signed)
Need the results of the tests that were collected and pt needs to be notified still need c.diff test.

## 2015-08-12 ENCOUNTER — Other Ambulatory Visit: Payer: Self-pay | Admitting: *Deleted

## 2015-08-12 DIAGNOSIS — R197 Diarrhea, unspecified: Secondary | ICD-10-CM

## 2015-08-12 NOTE — Telephone Encounter (Signed)
Pt is coming into the office today to pickup supplies for another stool collection. Please advise, thanks

## 2015-08-12 NOTE — Telephone Encounter (Signed)
Have addressed loose stools with pt.  Check c.diff as outlined.

## 2015-08-12 NOTE — Telephone Encounter (Signed)
Pt states that her stools are still loose and unformed, notified pt of results and pt is coming in this afternoon to pick up supplies for C-diff stool sample. Please advise, thanks

## 2015-08-12 NOTE — Telephone Encounter (Signed)
The tests that we checked for parasites and other bacteria were negative.  C.diff test still needs to be collected if she is still have diarrhea.

## 2015-08-13 ENCOUNTER — Other Ambulatory Visit: Payer: Self-pay | Admitting: *Deleted

## 2015-08-13 DIAGNOSIS — R197 Diarrhea, unspecified: Secondary | ICD-10-CM

## 2015-08-14 ENCOUNTER — Other Ambulatory Visit: Payer: Self-pay | Admitting: Internal Medicine

## 2015-08-14 DIAGNOSIS — R197 Diarrhea, unspecified: Secondary | ICD-10-CM

## 2015-08-14 LAB — C. DIFFICILE GDH AND TOXIN A/B
C. difficile GDH: NOT DETECTED
C. difficile Toxin A/B: NOT DETECTED

## 2015-08-14 NOTE — Progress Notes (Signed)
Order placed for GI referral.   

## 2015-08-25 DIAGNOSIS — K580 Irritable bowel syndrome with diarrhea: Secondary | ICD-10-CM | POA: Diagnosis not present

## 2015-08-25 DIAGNOSIS — Z86010 Personal history of colonic polyps: Secondary | ICD-10-CM | POA: Diagnosis not present

## 2015-08-25 DIAGNOSIS — K219 Gastro-esophageal reflux disease without esophagitis: Secondary | ICD-10-CM | POA: Diagnosis not present

## 2015-09-02 DIAGNOSIS — H02423 Myogenic ptosis of bilateral eyelids: Secondary | ICD-10-CM | POA: Diagnosis not present

## 2015-09-15 ENCOUNTER — Encounter: Payer: Self-pay | Admitting: Internal Medicine

## 2015-09-15 ENCOUNTER — Ambulatory Visit (INDEPENDENT_AMBULATORY_CARE_PROVIDER_SITE_OTHER): Payer: PPO | Admitting: Internal Medicine

## 2015-09-15 VITALS — BP 100/60 | HR 70 | Temp 98.3°F | Resp 17 | Ht 63.0 in | Wt 146.5 lb

## 2015-09-15 DIAGNOSIS — Z860100 Personal history of colon polyps, unspecified: Secondary | ICD-10-CM

## 2015-09-15 DIAGNOSIS — R197 Diarrhea, unspecified: Secondary | ICD-10-CM

## 2015-09-15 DIAGNOSIS — E7800 Pure hypercholesterolemia, unspecified: Secondary | ICD-10-CM

## 2015-09-15 DIAGNOSIS — K2100 Gastro-esophageal reflux disease with esophagitis, without bleeding: Secondary | ICD-10-CM

## 2015-09-15 DIAGNOSIS — Z658 Other specified problems related to psychosocial circumstances: Secondary | ICD-10-CM

## 2015-09-15 DIAGNOSIS — F439 Reaction to severe stress, unspecified: Secondary | ICD-10-CM

## 2015-09-15 DIAGNOSIS — E039 Hypothyroidism, unspecified: Secondary | ICD-10-CM | POA: Diagnosis not present

## 2015-09-15 NOTE — Progress Notes (Signed)
Patient ID: [name], female   DOB: 02-Feb-1946, 70 y.o.   MRN: [id number]   Subjective:    Patient ID: [name], female    DOB: Jul 31, 1945, 70 y.o.   MRN: [id number]  HPI  Patient here for a scheduled follow up.  She is still with increased stress trying to cope with her husband's death.  She feels she is doing better.  Was having persistent increased diarrhea.  See previous note.  Saw Dr Collene Mares.  Felt related to stress/IBS.  Was given viberzl.  Helped.  Plans to get more samples.  She is eating.  No nausea or vomiting.  Bowels better.  Eating.  Breathing stable.  No chest pain or tightness.  No sob.  No acid reflux.     Past Medical History  Diagnosis Date  . Hypothyroidism   . Environmental allergies   . Hypercholesterolemia   . Arthritis   . GERD (gastroesophageal reflux disease)   . Esophagitis     Barrets, followed by Dr Vennie Homans   Past Surgical History  Procedure Laterality Date  . Ankle surgery  1967  . Wrist surgery  1970    cyst removal  . Rhinoplasty  1986  . Knee arthroscopy  12/03/07    left  . Knee arthroscopy  06/25/08    left  . Replacement total knee  01/04/09    left  . Nasal sinus surgery  12/06  . Vaginal hysterectomy  1980    ovaries left in place   Family History  Problem Relation Age of Onset  . Heart disease Father   . Hyperlipidemia Paternal Grandmother   . Hyperlipidemia Paternal Grandfather   . Prostate cancer Maternal Uncle   . Pancreatic cancer Cousin   . Multiple myeloma Brother   . Breast cancer Maternal Aunt   . Breast cancer Maternal Aunt    Social History   Social History  . Marital Status: Married    Spouse Name: N/A  . Number of Children: N/A  . Years of Education: N/A   Social History Main Topics  . Smoking status: Former Smoker -- 5 years    Types: Cigarettes  . Smokeless tobacco: Never Used  . Alcohol Use: No  . Drug Use: No  . Sexual Activity: Not Currently   Other Topics Concern  . None   Social History  Narrative    Outpatient Encounter Prescriptions as of 09/15/2015  Medication Sig  . acetaminophen (TYLENOL) 500 MG tablet Take 500 mg by mouth every 6 (six) hours as needed.  Marland Kitchen BIOTIN 5000 PO Take 1 tablet by mouth daily.  . busPIRone (BUSPAR) 5 MG tablet Take 1 tablet (5 mg total) by mouth 2 (two) times daily.  . Cholecalciferol (VITAMIN D PO) Take 5,000 Units by mouth daily.  . Coenzyme Q10 60 MG TABS Take 120 mg by mouth daily.  . Eluxadoline (VIBERZI) 75 MG TABS Take 75 mg by mouth daily.  Marland Kitchen esomeprazole (NEXIUM) 40 MG capsule Take 40 mg by mouth daily as needed.   . Hypertonic Nasal Wash (SINUS RINSE NA) Place into the nose as needed.  . Lactobacillus (PROBIOTIC ACIDOPHILUS PO) Take 1,360 mg by mouth daily.  Marland Kitchen levothyroxine (LEVOTHROID) 75 MCG tablet Take 1 tablet (75 mcg total) by mouth every other day.  . levothyroxine (SYNTHROID, LEVOTHROID) 50 MCG tablet Take 1 tablet (50 mcg total) by mouth every other day.  . Multiple Vitamin (MULTIVITAMIN) LIQD Take 5 mLs by mouth daily.  . Omega-3  Fatty Acids (FISH OIL) 1360 MG CAPS Take by mouth daily.  Marland Kitchen omeprazole (PRILOSEC) 20 MG capsule Take 1 capsule (20 mg total) by mouth daily.  . Probiotic Product (PROBIOTIC DAILY PO) Take 1 tablet by mouth.  . rosuvastatin (CRESTOR) 5 MG tablet Take 1 tablet (5 mg total) by mouth daily.  . vitamin E 400 UNIT capsule Take 400 Units by mouth daily.   No facility-administered encounter medications on file as of 09/15/2015.    Review of Systems  Constitutional: Negative for appetite change and unexpected weight change.  HENT: Negative for congestion and sinus pressure.   Respiratory: Negative for cough, chest tightness and shortness of breath.   Cardiovascular: Negative for chest pain, palpitations and leg swelling.  Gastrointestinal: Negative for nausea, vomiting, abdominal pain and diarrhea.  Genitourinary: Negative for dysuria and difficulty urinating.  Musculoskeletal: Negative for back pain and  joint swelling.  Skin: Negative for color change and rash.  Neurological: Negative for dizziness, light-headedness and headaches.  Psychiatric/Behavioral: Negative for dysphoric mood and agitation.       Objective:    Physical Exam  Constitutional: She appears well-developed and well-nourished. No distress.  HENT:  Nose: Nose normal.  Mouth/Throat: Oropharynx is clear and moist.  Neck: Neck supple. No thyromegaly present.  Cardiovascular: Normal rate and regular rhythm.   Pulmonary/Chest: Breath sounds normal. No respiratory distress. She has no wheezes.  Abdominal: Soft. Bowel sounds are normal. There is no tenderness.  Musculoskeletal: She exhibits no edema or tenderness.  Lymphadenopathy:    She has no cervical adenopathy.  Skin: No rash noted. No erythema.  Psychiatric: She has a normal mood and affect. Her behavior is normal.    BP 100/60 mmHg  Pulse 70  Temp(Src) 98.3 F (36.8 C) (Oral)  Resp 17  Ht 5' 3"  (1.6 m)  Wt 146 lb 8 oz (66.452 kg)  BMI 25.96 kg/m2  SpO2 97% Wt Readings from Last 3 Encounters:  09/15/15 146 lb 8 oz (66.452 kg)  07/30/15 151 lb (68.493 kg)  05/27/15 150 lb (68.04 kg)     Lab Results  Component Value Date   WBC 5.3 01/29/2015   HGB 13.0 01/29/2015   HCT 38.8 01/29/2015   PLT 270.0 01/29/2015   GLUCOSE 94 07/27/2015   CHOL 227* 07/27/2015   TRIG 151.0* 07/27/2015   HDL 59.20 07/27/2015   LDLDIRECT 123.4 02/27/2013   LDLCALC 138* 07/27/2015   ALT 17 07/27/2015   AST 19 07/27/2015   NA 143 07/27/2015   K 4.0 07/27/2015   CL 106 07/27/2015   CREATININE 0.65 07/27/2015   BUN 14 07/27/2015   CO2 30 07/27/2015   TSH 0.82 07/30/2015       Assessment & Plan:   Problem List Items Addressed This Visit    Diarrhea    Was having persistent diarrhea.   Saw GI.  Started on viberzl.  Helped.  Follow.  Doing better.       GERD (gastroesophageal reflux disease) - Primary    Symptoms controlled on current regimen.  Followed by GI.   Had EGD 12/2014.        Relevant Medications   Eluxadoline (VIBERZI) 75 MG TABS   History of colonic polyps    Had colonoscopy 04/2013.  Recommended f/u in five years.        Hypercholesteremia    On crestor.  Low cholesterol diet and exercise.  Follow lipid panel.       Hypothyroidism    On thyroid  replacement.  Follow tsh.       Stress    Increased stress with her husband's death.  Discussed with her today.  Overall she feels she is doing better.  Follow.            Einar Pheasant, MD

## 2015-09-15 NOTE — Progress Notes (Signed)
Pre-visit discussion using our clinic review tool. No additional management support is needed unless otherwise documented below in the visit note.  

## 2015-09-20 ENCOUNTER — Encounter: Payer: Self-pay | Admitting: Internal Medicine

## 2015-09-20 NOTE — Assessment & Plan Note (Signed)
Increased stress with her husband's death.  Discussed with her today.  Overall she feels she is doing better.  Follow.

## 2015-09-20 NOTE — Assessment & Plan Note (Signed)
Was having persistent diarrhea.   Saw GI.  Started on viberzl.  Helped.  Follow.  Doing better.

## 2015-09-20 NOTE — Assessment & Plan Note (Signed)
Had colonoscopy 04/2013.  Recommended f/u in five years.

## 2015-09-20 NOTE — Assessment & Plan Note (Signed)
On crestor.  Low cholesterol diet and exercise.  Follow lipid panel.  

## 2015-09-20 NOTE — Assessment & Plan Note (Signed)
On thyroid replacement.  Follow tsh.  

## 2015-09-20 NOTE — Assessment & Plan Note (Signed)
Symptoms controlled on current regimen.  Followed by GI.  Had EGD 12/2014.

## 2015-09-21 ENCOUNTER — Encounter: Payer: Self-pay | Admitting: Family Medicine

## 2015-09-21 ENCOUNTER — Ambulatory Visit (INDEPENDENT_AMBULATORY_CARE_PROVIDER_SITE_OTHER): Payer: PPO | Admitting: Family Medicine

## 2015-09-21 VITALS — BP 110/76 | HR 85 | Temp 98.4°F | Ht 63.0 in | Wt 146.0 lb

## 2015-09-21 DIAGNOSIS — J019 Acute sinusitis, unspecified: Secondary | ICD-10-CM | POA: Insufficient documentation

## 2015-09-21 MED ORDER — AMOXICILLIN-POT CLAVULANATE 875-125 MG PO TABS: 1.0000 | ORAL_TABLET | Freq: Two times a day (BID) | ORAL | Status: DC

## 2015-09-21 NOTE — Patient Instructions (Signed)
Take the augmentin as prescribed.  Continue the mucinex and the saline.  Follow up as needed.  Take care  Dr. Lacinda Axon   Sinusitis, Adult Sinusitis is redness, soreness, and inflammation of the paranasal sinuses. Paranasal sinuses are air pockets within the bones of your face. They are located beneath your eyes, in the middle of your forehead, and above your eyes. In healthy paranasal sinuses, mucus is able to drain out, and air is able to circulate through them by way of your nose. However, when your paranasal sinuses are inflamed, mucus and air can become trapped. This can allow bacteria and other germs to grow and cause infection. Sinusitis can develop quickly and last only a short time (acute) or continue over a long period (chronic). Sinusitis that lasts for more than 12 weeks is considered chronic. CAUSES Causes of sinusitis include:  Allergies.  Structural abnormalities, such as displacement of the cartilage that separates your nostrils (deviated septum), which can decrease the air flow through your nose and sinuses and affect sinus drainage.  Functional abnormalities, such as when the small hairs (cilia) that line your sinuses and help remove mucus do not work properly or are not present. SIGNS AND SYMPTOMS Symptoms of acute and chronic sinusitis are the same. The primary symptoms are pain and pressure around the affected sinuses. Other symptoms include:  Upper toothache.  Earache.  Headache.  Bad breath.  Decreased sense of smell and taste.  A cough, which worsens when you are lying flat.  Fatigue.  Fever.  Thick drainage from your nose, which often is green and may contain pus (purulent).  Swelling and warmth over the affected sinuses. DIAGNOSIS Your health care provider will perform a physical exam. During your exam, your health care provider may perform any of the following to help determine if you have acute sinusitis or chronic sinusitis:  Look in your nose for  signs of abnormal growths in your nostrils (nasal polyps).  Tap over the affected sinus to check for signs of infection.  View the inside of your sinuses using an imaging device that has a light attached (endoscope). If your health care provider suspects that you have chronic sinusitis, one or more of the following tests may be recommended:  Allergy tests.  Nasal culture. A sample of mucus is taken from your nose, sent to a lab, and screened for bacteria.  Nasal cytology. A sample of mucus is taken from your nose and examined by your health care provider to determine if your sinusitis is related to an allergy. TREATMENT Most cases of acute sinusitis are related to a viral infection and will resolve on their own within 10 days. Sometimes, medicines are prescribed to help relieve symptoms of both acute and chronic sinusitis. These may include pain medicines, decongestants, nasal steroid sprays, or saline sprays. However, for sinusitis related to a bacterial infection, your health care provider will prescribe antibiotic medicines. These are medicines that will help kill the bacteria causing the infection. Rarely, sinusitis is caused by a fungal infection. In these cases, your health care provider will prescribe antifungal medicine. For some cases of chronic sinusitis, surgery is needed. Generally, these are cases in which sinusitis recurs more than 3 times per year, despite other treatments. HOME CARE INSTRUCTIONS  Drink plenty of water. Water helps thin the mucus so your sinuses can drain more easily.  Use a humidifier.  Inhale steam 3-4 times a day (for example, sit in the bathroom with the shower running).  Apply a  warm, moist washcloth to your face 3-4 times a day, or as directed by your health care provider.  Use saline nasal sprays to help moisten and clean your sinuses.  Take medicines only as directed by your health care provider.  If you were prescribed either an antibiotic or  antifungal medicine, finish it all even if you start to feel better. SEEK IMMEDIATE MEDICAL CARE IF:  You have increasing pain or severe headaches.  You have nausea, vomiting, or drowsiness.  You have swelling around your face.  You have vision problems.  You have a stiff neck.  You have difficulty breathing.   This information is not intended to replace advice given to you by your health care provider. Make sure you discuss any questions you have with your health care provider.   Document Released: 05/30/2005 Document Revised: 06/20/2014 Document Reviewed: 06/14/2011 Elsevier Interactive Patient Education [employer].

## 2015-09-21 NOTE — Assessment & Plan Note (Signed)
New acute illness. Treating with Augmentin.

## 2015-09-21 NOTE — Progress Notes (Signed)
   Subjective:  Patient ID: [name], female    DOB: [birth year]  Age: 70 y.o. MRN: [id number]  CC: ? Sinusitis  HPI:  70 year old female presents to clinic today with concerns for sinusitis.  Patient states that she's been sick since Thursday. Began with sore throat and now has progressed to severe sinus pressure and congestion. She reports associated fever, T max 100.3. She's been using a saline rinse and Mucinex with some improvement. She states that she has significant mucus from her sinuses. She reports associated fatigue and malaise. She states that she has been taking amoxicillin for her symptoms as of late given the severity. She has noted improvement but no resolution.   Social Hx   Social History   Social History  . Marital Status: Married    Spouse Name: N/A  . Number of Children: N/A  . Years of Education: N/A   Social History Main Topics  . Smoking status: Former Smoker -- 5 years    Types: Cigarettes  . Smokeless tobacco: Never Used  . Alcohol Use: No  . Drug Use: No  . Sexual Activity: Not Currently   Other Topics Concern  . None   Social History Narrative   Review of Systems  Constitutional: Positive for fever.  HENT: Positive for congestion and sinus pressure.    Objective:  BP 110/76 mmHg  Pulse 85  Temp(Src) 98.4 F (36.9 C) (Oral)  Ht 5\' 3"  (1.6 m)  Wt 146 lb (66.225 kg)  BMI 25.87 kg/m2  SpO2 96%  BP/Weight 09/21/2015 09/15/2015 [id number]  Systolic BP [id number]  Diastolic BP 76 60 62  Wt. (Lbs) 146 146.5 151  BMI 25.87 25.96 26.76   Physical Exam  Constitutional: She is oriented to person, place, and time. She appears well-developed. No distress.  HENT:  Head: Normocephalic and atraumatic.  Mouth/Throat: Oropharynx is clear and moist.  Normal TM's bilaterally.   Eyes: Conjunctivae are normal.  Cardiovascular: Normal rate and regular rhythm.   Pulmonary/Chest: Effort normal and breath sounds normal. She has no wheezes. She has no  rales.  Neurological: She is alert and oriented to person, place, and time.  Vitals reviewed.  Lab Results  Component Value Date   WBC 5.3 01/29/2015   HGB 13.0 01/29/2015   HCT 38.8 01/29/2015   PLT 270.0 01/29/2015   GLUCOSE 94 07/27/2015   CHOL 227* 07/27/2015   TRIG 151.0* 07/27/2015   HDL 59.20 07/27/2015   LDLDIRECT 123.4 02/27/2013   LDLCALC 138* 07/27/2015   ALT 17 07/27/2015   AST 19 07/27/2015   NA 143 07/27/2015   K 4.0 07/27/2015   CL 106 07/27/2015   CREATININE 0.65 07/27/2015   BUN 14 07/27/2015   CO2 30 07/27/2015   TSH 0.82 07/30/2015    Assessment & Plan:   Problem List Items Addressed This Visit    Acute sinusitis - Primary    New acute illness. Treating with Augmentin.      Relevant Medications   amoxicillin-clavulanate (AUGMENTIN) 875-125 MG tablet      Meds ordered this encounter  Medications  . amoxicillin-clavulanate (AUGMENTIN) 875-125 MG tablet    Sig: Take 1 tablet by mouth 2 (two) times daily.    Dispense:  20 tablet    Refill:  0   Follow-up: PRN  [place]une

## 2015-09-21 NOTE — Progress Notes (Signed)
Pre visit review using our clinic review tool, if applicable. No additional management support is needed unless otherwise documented below in the visit note. 

## 2015-09-28 ENCOUNTER — Other Ambulatory Visit: Payer: Self-pay | Admitting: Internal Medicine

## 2015-09-28 MED ORDER — ROSUVASTATIN CALCIUM 5 MG PO TABS: 5.0000 mg | ORAL_TABLET | Freq: Every day | ORAL | Status: DC

## 2015-09-29 ENCOUNTER — Other Ambulatory Visit: Payer: Self-pay | Admitting: *Deleted

## 2015-09-29 MED ORDER — BUSPIRONE HCL 5 MG PO TABS: 5.0000 mg | ORAL_TABLET | Freq: Two times a day (BID) | ORAL | Status: DC

## 2015-10-09 ENCOUNTER — Encounter: Payer: Self-pay | Admitting: Internal Medicine

## 2015-10-09 ENCOUNTER — Ambulatory Visit (INDEPENDENT_AMBULATORY_CARE_PROVIDER_SITE_OTHER): Payer: PPO | Admitting: Internal Medicine

## 2015-10-09 VITALS — BP 120/70 | HR 78 | Temp 98.2°F | Ht 63.0 in | Wt 145.0 lb

## 2015-10-09 DIAGNOSIS — R197 Diarrhea, unspecified: Secondary | ICD-10-CM

## 2015-10-09 DIAGNOSIS — F439 Reaction to severe stress, unspecified: Secondary | ICD-10-CM

## 2015-10-09 DIAGNOSIS — J069 Acute upper respiratory infection, unspecified: Secondary | ICD-10-CM

## 2015-10-09 DIAGNOSIS — Z658 Other specified problems related to psychosocial circumstances: Secondary | ICD-10-CM

## 2015-10-09 MED ORDER — AMOXICILLIN-POT CLAVULANATE 875-125 MG PO TABS: 1.0000 | ORAL_TABLET | Freq: Two times a day (BID) | ORAL | Status: DC

## 2015-10-09 NOTE — Progress Notes (Signed)
Pre visit review using our clinic review tool, if applicable. No additional management support is needed unless otherwise documented below in the visit note. 

## 2015-10-09 NOTE — Progress Notes (Signed)
Patient ID: [name], female   DOB: June 21, 1945, 70 y.o.   MRN: [id number]   Subjective:    Patient ID: [name], female    DOB: 1945/10/16, 70 y.o.   MRN: [id number]  HPI  Patient here as a work in with concerns regarding increased chest congestion.  Saw Dr Lacinda Axon on 09/21/15.  See note.  Treated with augmentin.  Was better.  Increased stress.  Still dealing with her husband's death.  Also had to have her cat put to sleep.  Discussed with her today.  She reports that this week, her symptoms have worsened again.  Increased chest congestion and cough.  Minimal cough.  Chest tight at times - with the increased congestion.  Feels some better today.  No vomiting.  No fever.  Eating and drinking.     Past Medical History  Diagnosis Date  . Hypothyroidism   . Environmental allergies   . Hypercholesterolemia   . Arthritis   . GERD (gastroesophageal reflux disease)   . Esophagitis     [place], followed by Dr Vennie Homans   Past Surgical History  Procedure Laterality Date  . Ankle surgery  1967  . Wrist surgery  1970    cyst removal  . Rhinoplasty  1986  . Knee arthroscopy  12/03/07    left  . Knee arthroscopy  06/25/08    left  . Replacement total knee  01/04/09    left  . Nasal sinus surgery  12/06  . Vaginal hysterectomy  1980    ovaries left in place   Family History  Problem Relation Age of Onset  . Heart disease Father   . Hyperlipidemia Paternal Grandmother   . Hyperlipidemia Paternal Grandfather   . Prostate cancer Maternal Uncle   . Pancreatic cancer Cousin   . Multiple myeloma Brother   . Breast cancer Maternal Aunt   . Breast cancer Maternal Aunt    Social History   Social History  . Marital Status: Married    Spouse Name: N/A  . Number of Children: N/A  . Years of Education: N/A   Social History Main Topics  . Smoking status: Former Smoker -- 5 years    Types: Cigarettes  . Smokeless tobacco: Never Used  . Alcohol Use: No  . Drug Use: No  . Sexual  Activity: Not Currently   Other Topics Concern  . None   Social History Narrative    Outpatient Encounter Prescriptions as of 10/09/2015  Medication Sig  . acetaminophen (TYLENOL) 500 MG tablet Take 500 mg by mouth every 6 (six) hours as needed.  Marland Kitchen BIOTIN 5000 PO Take 1 tablet by mouth daily.  . busPIRone (BUSPAR) 5 MG tablet Take 1 tablet (5 mg total) by mouth 2 (two) times daily.  . Cholecalciferol (VITAMIN D PO) Take 5,000 Units by mouth daily.  . Coenzyme Q10 60 MG TABS Take 120 mg by mouth daily.  . Eluxadoline (VIBERZI) 75 MG TABS Take 75 mg by mouth daily.  Marland Kitchen esomeprazole (NEXIUM) 40 MG capsule Take 40 mg by mouth daily as needed.   . Hypertonic Nasal Wash (SINUS RINSE NA) Place into the nose as needed.  . Lactobacillus (PROBIOTIC ACIDOPHILUS PO) Take 1,360 mg by mouth daily.  Marland Kitchen levothyroxine (LEVOTHROID) 75 MCG tablet Take 1 tablet (75 mcg total) by mouth every other day.  . levothyroxine (SYNTHROID, LEVOTHROID) 50 MCG tablet Take 1 tablet (50 mcg total) by mouth every other day.  . Multiple Vitamin (MULTIVITAMIN) LIQD  Take 5 mLs by mouth daily.  . Omega-3 Fatty Acids (FISH OIL) 1360 MG CAPS Take by mouth daily.  Marland Kitchen omeprazole (PRILOSEC) 20 MG capsule Take 1 capsule (20 mg total) by mouth daily.  . Probiotic Product (PROBIOTIC DAILY PO) Take 1 tablet by mouth.  . rosuvastatin (CRESTOR) 5 MG tablet Take 1 tablet (5 mg total) by mouth daily.  . vitamin E 400 UNIT capsule Take 400 Units by mouth daily.  Marland Kitchen amoxicillin-clavulanate (AUGMENTIN) 875-125 MG tablet Take 1 tablet by mouth 2 (two) times daily.  . [DISCONTINUED] amoxicillin-clavulanate (AUGMENTIN) 875-125 MG tablet Take 1 tablet by mouth 2 (two) times daily. (Patient not taking: Reported on 10/09/2015)   No facility-administered encounter medications on file as of 10/09/2015.    Review of Systems  Constitutional: Negative for appetite change and unexpected weight change.  HENT: Positive for congestion. Negative for sinus  pressure.   Respiratory: Positive for cough (minimal cough). Negative for shortness of breath and wheezing.   Cardiovascular: Negative for chest pain, palpitations and leg swelling.  Gastrointestinal: Negative for nausea, vomiting and abdominal pain.       Minimal diarrhea.    Skin: Negative for color change and rash.  Neurological: Negative for dizziness, light-headedness and headaches.       Objective:    Physical Exam  Constitutional: She appears well-developed and well-nourished. No distress.  HENT:  Nose: Nose normal.  Mouth/Throat: Oropharynx is clear and moist.  Neck: Neck supple.  Cardiovascular: Normal rate and regular rhythm.   Pulmonary/Chest: Breath sounds normal. No respiratory distress. She has no wheezes.  Abdominal: Soft. Bowel sounds are normal. There is no tenderness.  Musculoskeletal: She exhibits no edema or tenderness.  Lymphadenopathy:    She has no cervical adenopathy.    BP 120/70 mmHg  Pulse 78  Temp(Src) 98.2 F (36.8 C) (Oral)  Ht _0  (1.6 m)  Wt 145 lb (65.772 kg)  BMI 25.69 kg/m2  SpO2 98% Wt Readings from Last 3 Encounters:  10/09/15 145 lb (65.772 kg)  09/21/15 146 lb (66.225 kg)  09/15/15 146 lb 8 oz (66.452 kg)     Lab Results  Component Value Date   WBC 5.3 01/29/2015   HGB 13.0 01/29/2015   HCT 38.8 01/29/2015   PLT 270.0 01/29/2015   GLUCOSE 94 07/27/2015   CHOL 227* 07/27/2015   TRIG 151.0* 07/27/2015   HDL 59.20 07/27/2015   LDLDIRECT 123.4 02/27/2013   LDLCALC 138* 07/27/2015   ALT 17 07/27/2015   AST 19 07/27/2015   NA 143 07/27/2015   K 4.0 07/27/2015   CL 106 07/27/2015   CREATININE 0.65 07/27/2015   BUN 14 07/27/2015   CO2 30 07/27/2015   TSH 0.82 07/30/2015        Assessment & Plan:   Problem List Items Addressed This Visit    Diarrhea    Better.  Feel related to increased stress.  Follow. Probiotic.        Stress - Primary    Increased stress as outlined.  Discussed with her today.  She does have  good support.  Follow.        URI (upper respiratory infection)    Chest congestion and cough.  Extend out augmentin as directed.  mucinex as directed.  Rest.  Follow.          I spent 25 minutes with the patient and more than 50% of the time was spent in consultation regarding the above.     Vibhav Waddill,  [place]ell Patient, MD

## 2015-10-11 ENCOUNTER — Encounter: Payer: Self-pay | Admitting: Internal Medicine

## 2015-10-11 DIAGNOSIS — J069 Acute upper respiratory infection, unspecified: Secondary | ICD-10-CM | POA: Insufficient documentation

## 2015-10-11 NOTE — Assessment & Plan Note (Signed)
Increased stress as outlined.  Discussed with her today.  She does have good support.  Follow.

## 2015-10-11 NOTE — Assessment & Plan Note (Signed)
Chest congestion and cough.  Extend out augmentin as directed.  mucinex as directed.  Rest.  Follow.

## 2015-10-11 NOTE — Assessment & Plan Note (Signed)
Better.  Feel related to increased stress.  Follow. Probiotic.

## 2015-11-17 ENCOUNTER — Ambulatory Visit (INDEPENDENT_AMBULATORY_CARE_PROVIDER_SITE_OTHER): Payer: PPO | Admitting: Family

## 2015-11-17 ENCOUNTER — Encounter: Payer: Self-pay | Admitting: Family

## 2015-11-17 VITALS — BP 118/70 | HR 68 | Resp 16 | Ht 63.0 in | Wt 144.0 lb

## 2015-11-17 DIAGNOSIS — Z23 Encounter for immunization: Secondary | ICD-10-CM | POA: Diagnosis not present

## 2015-11-17 DIAGNOSIS — S91111A Laceration without foreign body of right great toe without damage to nail, initial encounter: Secondary | ICD-10-CM | POA: Diagnosis not present

## 2015-11-17 DIAGNOSIS — IMO0002 Reserved for concepts with insufficient information to code with codable children: Secondary | ICD-10-CM

## 2015-11-17 MED ORDER — MUPIROCIN 2 % EX OINT: 1.0000 "application " | TOPICAL_OINTMENT | Freq: Two times a day (BID) | CUTANEOUS | Status: DC

## 2015-11-17 NOTE — Patient Instructions (Signed)
Stay vigilant with signs of infection.   If there is no improvement in your symptoms, or if there is any worsening of symptoms, or if you have any additional concerns, please return for re-evaluation; or, if we are closed, consider going to the Emergency Room for evaluation if symptoms urgent.  Tdap Vaccine (Tetanus, Diphtheria and Pertussis): What You Need to Know 1. Why get vaccinated? Tetanus, diphtheria and pertussis are very serious diseases. Tdap vaccine can protect [place] from these diseases. And, Tdap vaccine given to pregnant women can protect newborn babies against pertussis. TETANUS (Lockjaw) is rare in the [place] today. It causes painful muscle tightening and stiffness, usually all over the body.  It can lead to tightening of muscles in the head and neck so you can't open your mouth, swallow, or sometimes even breathe. Tetanus kills about 1 out of 10 people who are infected even after receiving the best medical care. DIPHTHERIA is also rare in the [place] today. It can cause a thick coating to form in the back of the throat.  It can lead to breathing problems, heart failure, paralysis, and death. PERTUSSIS (Whooping Cough) causes severe coughing spells, which can cause difficulty breathing, vomiting and disturbed sleep.  It can also lead to weight loss, incontinence, and rib fractures. Up to 2 in 100 adolescents and 5 in 100 adults with pertussis are hospitalized or have complications, which could include pneumonia or death. These diseases are caused by bacteria. Diphtheria and pertussis are spread from person to person through secretions from coughing or sneezing. Tetanus enters the body through cuts, scratches, or wounds. Before vaccines, as many as 200,000 cases of diphtheria, 200,000 cases of pertussis, and hundreds of cases of tetanus, were reported in the [place] each year. Since vaccination began, reports of cases for tetanus and diphtheria have dropped by about 99%  and for pertussis by about 80%. 2. Tdap vaccine Tdap vaccine can protect adolescents and adults from tetanus, diphtheria, and pertussis. One dose of Tdap is routinely given at age 80 or 62. People who did not get Tdap at that age should get it as soon as possible. Tdap is especially important for healthcare professionals and anyone having close contact with a baby younger than 12 months. Pregnant women should get a dose of Tdap during every pregnancy, to protect the newborn from pertussis. Infants are most at risk for severe, life-threatening complications from pertussis. Another vaccine, called Td, protects against tetanus and diphtheria, but not pertussis. A Td booster should be given every 10 years. Tdap may be given as one of these boosters if you have never gotten Tdap before. Tdap may also be given after a severe cut or burn to prevent tetanus infection. Your doctor or the person giving you the vaccine can give you more information. Tdap may safely be given at the same time as other vaccines. 3. Some people should not get this vaccine  A person who has ever had a life-threatening allergic reaction after a previous dose of any diphtheria, tetanus or pertussis containing vaccine, OR has a severe allergy to any part of this vaccine, should not get Tdap vaccine. Tell the person giving the vaccine about any severe allergies.  Anyone who had coma or long repeated seizures within 7 days after a childhood dose of DTP or DTaP, or a previous dose of Tdap, should not get Tdap, unless a cause other than the vaccine was found. They can still get Td.  Talk to your doctor if  you:  have seizures or another nervous system problem,  had severe pain or swelling after any vaccine containing diphtheria, tetanus or pertussis,  ever had a condition called Guillain-Barr Syndrome (GBS),  aren't feeling well on the day the shot is scheduled. 4. Risks With any medicine, including vaccines, there is a chance of  side effects. These are usually mild and go away on their own. Serious reactions are also possible but are rare. Most people who get Tdap vaccine do not have any problems with it. Mild problems following Tdap (Did not interfere with activities)  Pain where the shot was given (about 3 in 4 adolescents or 2 in 3 adults)  Redness or swelling where the shot was given (about 1 person in 5)  Mild fever of at least 100.28F (up to about 1 in 25 adolescents or 1 in 100 adults)  Headache (about 3 or 4 people in 10)  Tiredness (about 1 person in 3 or 4)  Nausea, vomiting, diarrhea, stomach ache (up to 1 in 4 adolescents or 1 in 10 adults)  Chills, sore joints (about 1 person in 10)  Body aches (about 1 person in 3 or 4)  Rash, swollen glands (uncommon) Moderate problems following Tdap (Interfered with activities, but did not require medical attention)  Pain where the shot was given (up to 1 in 5 or 6)  Redness or swelling where the shot was given (up to about 1 in 16 adolescents or 1 in 12 adults)  Fever over 102F (about 1 in 100 adolescents or 1 in 250 adults)  Headache (about 1 in 7 adolescents or 1 in 10 adults)  Nausea, vomiting, diarrhea, stomach ache (up to 1 or 3 people in 100)  Swelling of the entire arm where the shot was given (up to about 1 in 500). Severe problems following Tdap (Unable to perform usual activities; required medical attention)  Swelling, severe pain, bleeding and redness in the arm where the shot was given (rare). Problems that could happen after any vaccine:  People sometimes faint after a medical procedure, including vaccination. Sitting or lying down for about 15 minutes can help prevent fainting, and injuries caused by a fall. Tell your doctor if you feel dizzy, or have vision changes or ringing in the ears.  Some people get severe pain in the shoulder and have difficulty moving the arm where a shot was given. This happens very rarely.  Any  medication can cause a severe allergic reaction. Such reactions from a vaccine are very rare, estimated at fewer than 1 in a million doses, and would happen within a few minutes to a few hours after the vaccination. As with any medicine, there is a very remote chance of a vaccine causing a serious injury or death. The safety of vaccines is always being monitored. For more information, visit: http://www.aguilar.org/ 5. What if there is a serious problem? What should I look for?  Look for anything that concerns you, such as signs of a severe allergic reaction, very high fever, or unusual behavior.  Signs of a severe allergic reaction can include hives, swelling of the face and throat, difficulty breathing, a fast heartbeat, dizziness, and weakness. These would usually start a few minutes to a few hours after the vaccination. What should I do?  If you think it is a severe allergic reaction or other emergency that can't wait, call 9-1-1 or get the person to the nearest hospital. Otherwise, call your doctor.  Afterward, the reaction should be  reported to the Vaccine Adverse Event Reporting System (VAERS). Your doctor might file this report, or you can do it yourself through the VAERS web site at www.vaers.SamedayNews.es, or by calling [phone]. VAERS does not give medical advice.  6. The National Vaccine Injury Compensation Program The [employer] Vaccine Injury Compensation Program (VICP) is a federal program that was created to compensate people who may have been injured by certain vaccines. Persons who believe they may have been injured by a vaccine can learn about the program and about filing a claim by calling (323)338-6476 or visiting the [employer] website at GoldCloset.com.ee. There is a time limit to file a claim for compensation. 7. How can I learn more?  Ask your doctor. He or she can give you the vaccine package insert or suggest other sources of information.  Call your local or  state health department.  Contact the Centers for Disease Control and Prevention (CDC):  Call 249-630-2969 (1-800-CDC-INFO) or  Visit CDC's website at http://hunter.com/ CDC Tdap Vaccine VIS (08/06/13)   This information is not intended to replace advice given to you by your health care provider. Make sure you discuss any questions you have with your health care provider.   Document Released: 11/29/2011 Document Revised: 06/20/2014 Document Reviewed: 09/11/2013 Elsevier Interactive Patient Education [employer].

## 2015-11-17 NOTE — Progress Notes (Signed)
Subjective:    Patient ID: [name], female    DOB: 03/24/1946, 70 y.o.   MRN: [id number]   [name] is a 70 y.o. female who presents today for an acute visit.    HPI Comments: Patient here for evaluation of right foot after dropping scissors on  through tennis shoes one week. Right great toe small laceration, bleed at the time and has since resided. No purulent discharge, swelling, streaking.  Has been putting hydrogen peroxide and topical OTC ointment on wound. Hasnt had tetanus in years.    Past Medical History  Diagnosis Date  . Hypothyroidism   . Environmental allergies   . Hypercholesterolemia   . Arthritis   . GERD (gastroesophageal reflux disease)   . Esophagitis     [facility]rets, followed by Dr Vennie Homans   Allergies: Diclofenac; Ibuprofen; Tramadol; Hydromorphone; and Oxycodone Current Outpatient Prescriptions on File Prior to Visit  Medication Sig Dispense Refill  . acetaminophen (TYLENOL) 500 MG tablet Take 500 mg by mouth every 6 (six) hours as needed.    Marland Kitchen BIOTIN 5000 PO Take 1 tablet by mouth daily.    . busPIRone (BUSPAR) 5 MG tablet Take 1 tablet (5 mg total) by mouth 2 (two) times daily. 180 tablet 1  . Cholecalciferol (VITAMIN D PO) Take 5,000 Units by mouth daily.    . Coenzyme Q10 60 MG TABS Take 120 mg by mouth daily.    Marland Kitchen esomeprazole (NEXIUM) 40 MG capsule Take 40 mg by mouth daily as needed.     . Hypertonic Nasal Wash (SINUS RINSE NA) Place into the nose as needed.    . Lactobacillus (PROBIOTIC ACIDOPHILUS PO) Take 1,360 mg by mouth daily.    Marland Kitchen levothyroxine (LEVOTHROID) 75 MCG tablet Take 1 tablet (75 mcg total) by mouth every other day. 90 tablet 1  . levothyroxine (SYNTHROID, LEVOTHROID) 50 MCG tablet Take 1 tablet (50 mcg total) by mouth every other day. 90 tablet 1  . Multiple Vitamin (MULTIVITAMIN) LIQD Take 5 mLs by mouth daily.    . Omega-3 Fatty Acids (FISH OIL) 1360 MG CAPS Take by mouth daily.    Marland Kitchen omeprazole (PRILOSEC) 20 MG capsule Take 1  capsule (20 mg total) by mouth daily. 90 capsule 1  . Probiotic Product (PROBIOTIC DAILY PO) Take 1 tablet by mouth.    . rosuvastatin (CRESTOR) 5 MG tablet Take 1 tablet (5 mg total) by mouth daily. 90 tablet 3  . vitamin E 400 UNIT capsule Take 400 Units by mouth daily.    . Eluxadoline (VIBERZI) 75 MG TABS Take 75 mg by mouth daily. Reported on 11/17/2015     No current facility-administered medications on file prior to visit.    Social History  Substance Use Topics  . Smoking status: Former Smoker -- 5 years    Types: Cigarettes  . Smokeless tobacco: Never Used  . Alcohol Use: No    Review of Systems  Constitutional: Negative for fever and chills.  Respiratory: Negative for cough.   Cardiovascular: Negative for chest pain and palpitations.  Gastrointestinal: Negative for nausea and vomiting.  Musculoskeletal: Negative for myalgias.  Skin: Positive for wound. Negative for rash.      Objective:    BP 118/70 mmHg  Pulse 68  Resp 16  Ht 5\' 3"  (1.6 m)  Wt 144 lb (65.318 kg)  BMI 25.51 kg/m2  SpO2 98%   Physical Exam  Constitutional: She appears well-developed and well-nourished.  Eyes: Conjunctivae are normal.  Cardiovascular: Normal rate, regular rhythm, normal heart sounds and normal pulses.   Pulmonary/Chest: Effort normal and breath sounds normal. She has no wheezes. She has no rhonchi. She has no rales.  Musculoskeletal:       Feet:  Neurological: She is alert.  Skin: Skin is warm and dry.  Psychiatric: She has a normal mood and affect. Her speech is normal and behavior is normal. Thought content normal.  Vitals reviewed.      Assessment & Plan:   1. Laceration Covering empirically with topical antimicrobial for any local infection. Laceration appears to be healing well.  Vaccine given.   - Tdap vaccine greater than or equal to 7yo IM - mupirocin ointment (BACTROBAN) 2 %; Place 1 application into the nose 2 (two) times daily.  Dispense: 22 g; Refill:  0   I have discontinued Ms. Ellis's amoxicillin-clavulanate. I am also having her maintain her acetaminophen, multivitamin, Coenzyme Q10, Lactobacillus (PROBIOTIC ACIDOPHILUS PO), Fish Oil, Hypertonic Nasal Wash (SINUS RINSE NA), Cholecalciferol (VITAMIN D PO), esomeprazole, Probiotic Product (PROBIOTIC DAILY PO), vitamin E, BIOTIN 5000 PO, levothyroxine, levothyroxine, omeprazole, Eluxadoline, rosuvastatin, and busPIRone.   No orders of the defined types were placed in this encounter.     Start medications as prescribed and explained to patient on After Visit Summary ( AVS). Risks, benefits, and alternatives of the medications and treatment plan prescribed today were discussed, and patient expressed understanding.   Education regarding symptom management and diagnosis given to patient.   Follow-up:Plan follow-up and return precautions given if any worsening symptoms or change in condition.   Continue to follow with Einar Pheasant, MD for routine health maintenance.   [name] and I agreed with plan.   Mable Paris, FNP

## 2015-12-08 ENCOUNTER — Ambulatory Visit (INDEPENDENT_AMBULATORY_CARE_PROVIDER_SITE_OTHER): Payer: PPO | Admitting: Family

## 2015-12-08 ENCOUNTER — Encounter: Payer: Self-pay | Admitting: Family

## 2015-12-08 VITALS — BP 112/76 | HR 47 | Temp 98.3°F | Ht 63.0 in | Wt 143.4 lb

## 2015-12-08 DIAGNOSIS — L259 Unspecified contact dermatitis, unspecified cause: Secondary | ICD-10-CM | POA: Diagnosis not present

## 2015-12-08 MED ORDER — TRIAMCINOLONE ACETONIDE 0.025 % EX OINT: 1.0000 "application " | TOPICAL_OINTMENT | Freq: Two times a day (BID) | CUTANEOUS | Status: DC

## 2015-12-08 MED ORDER — PREDNISONE 10 MG PO TABS: ORAL_TABLET | ORAL | Status: DC

## 2015-12-08 MED ORDER — METHYLPREDNISOLONE ACETATE 80 MG/ML IJ SUSP
80.0000 mg | Freq: Once | INTRAMUSCULAR | Status: AC
  Administered 2015-12-08: 80 mg via INTRAMUSCULAR

## 2015-12-08 NOTE — Patient Instructions (Signed)
My hope is a steroid injection will drastically help calm down poison ivy.   If not better in a couple days, you may also take prednisone taper.   You may use a topical steroids however this is for SHORT term use as topical steroids may cause discoloration of your skin so be very careful on your face.  If there is no improvement in your symptoms, or if there is any worsening of symptoms, or if you have any additional concerns, please return for re-evaluation; or, if we are closed, consider going to the Emergency Room for evaluation if symptoms urgent.  Contact Dermatitis Dermatitis is redness, soreness, and swelling (inflammation) of the skin. Contact dermatitis is a reaction to certain substances that touch the skin. There are two types of contact dermatitis:   Irritant contact dermatitis. This type is caused by something that irritates your skin, such as dry hands from washing them too much. This type does not require previous exposure to the substance for a reaction to occur. This type is more common.  Allergic contact dermatitis. This type is caused by a substance that you are allergic to, such as a nickel allergy or poison ivy. This type only occurs if you have been exposed to the substance (allergen) before. Upon a repeat exposure, your body reacts to the substance. This type is less common. CAUSES  Many different substances can cause contact dermatitis. Irritant contact dermatitis is most commonly caused by exposure to:   Makeup.   Soaps.   Detergents.   Bleaches.   Acids.   Metal salts, such as nickel.  Allergic contact dermatitis is most commonly caused by exposure to:   Poisonous plants.   Chemicals.   Jewelry.   Latex.   Medicines.   Preservatives in products, such as clothing.  RISK FACTORS This condition is more likely to develop in:   People who have jobs that expose them to irritants or allergens.  People who have certain medical conditions,  such as asthma or eczema.  SYMPTOMS  Symptoms of this condition may occur anywhere on your body where the irritant has touched you or is touched by you. Symptoms include:  Dryness or flaking.   Redness.   Cracks.   Itching.   Pain or a burning feeling.   Blisters.  Drainage of small amounts of blood or clear fluid from skin cracks. With allergic contact dermatitis, there may also be swelling in areas such as the eyelids, mouth, or genitals.  DIAGNOSIS  This condition is diagnosed with a medical history and physical exam. A patch skin test may be performed to help determine the cause. If the condition is related to your job, you may need to see an occupational medicine specialist. TREATMENT Treatment for this condition includes figuring out what caused the reaction and protecting your skin from further contact. Treatment may also include:   Steroid creams or ointments. Oral steroid medicines may be needed in more severe cases.  Antibiotics or antibacterial ointments, if a skin infection is present.  Antihistamine lotion or an antihistamine taken by mouth to ease itching.  A bandage (dressing). HOME CARE INSTRUCTIONS Skin Care  Moisturize your skin as needed.   Apply cool compresses to the affected areas.  Try taking a bath with:  Epsom salts. Follow the instructions on the packaging. You can get these at your local pharmacy or grocery store.  Baking soda. Pour a small amount into the bath as directed by your health care provider.  Colloidal oatmeal.  Follow the instructions on the packaging. You can get this at your local pharmacy or grocery store.  Try applying baking soda paste to your skin. Stir water into baking soda until it reaches a paste-like consistency.  Do not scratch your skin.  Bathe less frequently, such as every other day.  Bathe in lukewarm water. Avoid using hot water. Medicines  Take or apply over-the-counter and prescription medicines  only as told by your health care provider.   If you were prescribed an antibiotic medicine, take or apply your antibiotic as told by your health care provider. Do not stop using the antibiotic even if your condition starts to improve. General Instructions  Keep all follow-up visits as told by your health care provider. This is important.  Avoid the substance that caused your reaction. If you do not know what caused it, keep a journal to try to track what caused it. Write down:  What you eat.  What cosmetic products you use.  What you drink.  What you wear in the affected area. This includes jewelry.  If you were given a dressing, take care of it as told by your health care provider. This includes when to change and remove it. SEEK MEDICAL CARE IF:   Your condition does not improve with treatment.  Your condition gets worse.  You have signs of infection such as swelling, tenderness, redness, soreness, or warmth in the affected area.  You have a fever.  You have new symptoms. SEEK IMMEDIATE MEDICAL CARE IF:   You have a severe headache, neck pain, or neck stiffness.  You vomit.  You feel very sleepy.  You notice red streaks coming from the affected area.  Your bone or joint underneath the affected area becomes painful after the skin has healed.  The affected area turns darker.  You have difficulty breathing.   This information is not intended to replace advice given to you by your health care provider. Make sure you discuss any questions you have with your health care provider.   Document Released: 05/27/2000 Document Revised: 02/18/2015 Document Reviewed: 10/15/2014 Elsevier Interactive Patient Education [employer].

## 2015-12-08 NOTE — Progress Notes (Signed)
Subjective:    Patient ID: [name], female    DOB: 1945/09/12, 70 y.o.   MRN: [id number]   [name] is a 70 y.o. female who presents today for an acute visit.    HPI Comments: Patient here for evaluation of rash which she states is poison ivy for 3 days, worsening. Started on right hand after she pulled poison oak from her yard. Rash has since spread to right cheek of face, right neck, bilateral arms and legs. Itchy. Relieved with warm steamy shower.   Past Medical History  Diagnosis Date  . Hypothyroidism   . Environmental allergies   . Hypercholesterolemia   . Arthritis   . GERD (gastroesophageal reflux disease)   . Esophagitis     [facility]rets, followed by Dr Vennie Homans   Allergies: Diclofenac; Ibuprofen; Tramadol; Hydromorphone; and Oxycodone Current Outpatient Prescriptions on File Prior to Visit  Medication Sig Dispense Refill  . acetaminophen (TYLENOL) 500 MG tablet Take 500 mg by mouth every 6 (six) hours as needed.    Marland Kitchen BIOTIN 5000 PO Take 1 tablet by mouth daily.    . busPIRone (BUSPAR) 5 MG tablet Take 1 tablet (5 mg total) by mouth 2 (two) times daily. 180 tablet 1  . Cholecalciferol (VITAMIN D PO) Take 5,000 Units by mouth daily.    . Coenzyme Q10 60 MG TABS Take 120 mg by mouth daily.    . Eluxadoline (VIBERZI) 75 MG TABS Take 75 mg by mouth daily. Reported on 11/17/2015    . esomeprazole (NEXIUM) 40 MG capsule Take 40 mg by mouth daily as needed.     . Hypertonic Nasal Wash (SINUS RINSE NA) Place into the nose as needed.    . Lactobacillus (PROBIOTIC ACIDOPHILUS PO) Take 1,360 mg by mouth daily.    Marland Kitchen levothyroxine (LEVOTHROID) 75 MCG tablet Take 1 tablet (75 mcg total) by mouth every other day. 90 tablet 1  . levothyroxine (SYNTHROID, LEVOTHROID) 50 MCG tablet Take 1 tablet (50 mcg total) by mouth every other day. 90 tablet 1  . Multiple Vitamin (MULTIVITAMIN) LIQD Take 5 mLs by mouth daily.    . mupirocin ointment (BACTROBAN) 2 % Place 1 application into the nose  2 (two) times daily. 22 g 0  . Omega-3 Fatty Acids (FISH OIL) 1360 MG CAPS Take by mouth daily.    Marland Kitchen omeprazole (PRILOSEC) 20 MG capsule Take 1 capsule (20 mg total) by mouth daily. 90 capsule 1  . Probiotic Product (PROBIOTIC DAILY PO) Take 1 tablet by mouth.    . rosuvastatin (CRESTOR) 5 MG tablet Take 1 tablet (5 mg total) by mouth daily. 90 tablet 3  . vitamin E 400 UNIT capsule Take 400 Units by mouth daily.     No current facility-administered medications on file prior to visit.    Social History  Substance Use Topics  . Smoking status: Former Smoker -- 5 years    Types: Cigarettes  . Smokeless tobacco: Never Used  . Alcohol Use: No    Review of Systems  Constitutional: Negative for fever and chills.  HENT: Negative for congestion.   Respiratory: Negative for cough.   Cardiovascular: Negative for chest pain and palpitations.  Gastrointestinal: Negative for nausea and vomiting.  Skin: Positive for rash.      Objective:    BP 112/76 mmHg  Pulse 47  Temp(Src) 98.3 F (36.8 C)  Ht 5\' 3"  (1.6 m)  Wt 143 lb 6.4 oz (65.046 kg)  BMI 25.41 kg/m2  SpO2 98%   Physical Exam  Constitutional: She appears well-developed and well-nourished.  Eyes: Conjunctivae are normal.  Cardiovascular: Normal rate, regular rhythm, normal heart sounds and normal pulses.   Pulmonary/Chest: Effort normal and breath sounds normal. She has no wheezes. She has no rhonchi. She has no rales.  Neurological: She is alert.  Skin: Skin is warm and dry. Rash noted. Rash is vesicular.  Vesicular lesions with clear fluid noted right hand and ventral aspect of right forearm, right cheek and neck, and left leg. No streaking, purulent discharge, increased warmth. No lesions noted proximal to bilateral eyes.  Psychiatric: She has a normal mood and affect. Her speech is normal and behavior is normal. Thought content normal.  Vitals reviewed.      Assessment & Plan:  1. Contact dermatitis No evidence of  bacterial infection. Due to worsening and severity of rash, appropriate to use 80mg  IM depo medrol to halt spread.  Advised patient that if rash does not improve after 1-2 days, she may then start the prednisone taper PO. She may also use topical corticosteroid and PO benadryl as needed.   - methylPREDNISolone acetate (DEPO-MEDROL) injection 80 mg; Inject 1 mL (80 mg total) into the muscle once. - triamcinolone (KENALOG) 0.025 % ointment; Apply 1 application topically 2 (two) times daily.  Dispense: 30 g; Refill: 0 - predniSONE (DELTASONE) 10 MG tablet; Take 4 tablets ( total 40 mg) by mouth for 2 days; take 3 tablets ( total 30 mg) by mouth for 2 days; take 2 tablets (total 20mg ) by mouth for 2 days; then take 1 tablet ( total 10mg ) by mouth for 2 days.  Dispense: 20 tablet; Refill: 0     I am having [name] maintain her acetaminophen, multivitamin, Coenzyme Q10, Lactobacillus (PROBIOTIC ACIDOPHILUS PO), Fish Oil, Hypertonic Nasal Wash (SINUS RINSE NA), Cholecalciferol (VITAMIN D PO), esomeprazole, Probiotic Product (PROBIOTIC DAILY PO), vitamin E, BIOTIN 5000 PO, levothyroxine, levothyroxine, omeprazole, Eluxadoline, rosuvastatin, busPIRone, and mupirocin ointment.   No orders of the defined types were placed in this encounter.     Start medications as prescribed and explained to patient on After Visit Summary ( AVS). Risks, benefits, and alternatives of the medications and treatment plan prescribed today were discussed, and patient expressed understanding.   Education regarding symptom management and diagnosis given to patient.   Follow-up:Plan follow-up and return precautions given if any worsening symptoms or change in condition.   Continue to follow with Einar Pheasant, MD for routine health maintenance.   [name] and I agreed with plan.   Mable Paris, FNP

## 2015-12-22 ENCOUNTER — Ambulatory Visit (INDEPENDENT_AMBULATORY_CARE_PROVIDER_SITE_OTHER): Payer: PPO | Admitting: Internal Medicine

## 2015-12-22 ENCOUNTER — Encounter: Payer: Self-pay | Admitting: Internal Medicine

## 2015-12-22 VITALS — BP 110/70 | HR 65 | Temp 98.2°F | Resp 18 | Ht 63.0 in | Wt 139.5 lb

## 2015-12-22 DIAGNOSIS — Z1239 Encounter for other screening for malignant neoplasm of breast: Secondary | ICD-10-CM

## 2015-12-22 DIAGNOSIS — F439 Reaction to severe stress, unspecified: Secondary | ICD-10-CM

## 2015-12-22 DIAGNOSIS — Z860100 Personal history of colon polyps, unspecified: Secondary | ICD-10-CM

## 2015-12-22 DIAGNOSIS — E7800 Pure hypercholesterolemia, unspecified: Secondary | ICD-10-CM | POA: Diagnosis not present

## 2015-12-22 DIAGNOSIS — K2100 Gastro-esophageal reflux disease with esophagitis, without bleeding: Secondary | ICD-10-CM

## 2015-12-22 DIAGNOSIS — L259 Unspecified contact dermatitis, unspecified cause: Secondary | ICD-10-CM

## 2015-12-22 DIAGNOSIS — E039 Hypothyroidism, unspecified: Secondary | ICD-10-CM | POA: Diagnosis not present

## 2015-12-22 DIAGNOSIS — Z658 Other specified problems related to psychosocial circumstances: Secondary | ICD-10-CM

## 2015-12-22 DIAGNOSIS — R197 Diarrhea, unspecified: Secondary | ICD-10-CM

## 2015-12-22 MED ORDER — NYSTATIN 100000 UNIT/GM EX CREA: 1.0000 "application " | TOPICAL_CREAM | Freq: Two times a day (BID) | CUTANEOUS | Status: DC

## 2015-12-22 NOTE — Progress Notes (Signed)
Patient ID: NARELY NOBLES, female   DOB: [birth year], 70 y.o.   MRN: [id number]   Subjective:    Patient ID: [name], female    DOB: [birth year], 70 y.o.   MRN: [id number]  HPI  Patient here for a scheduled follow up.  She is doing better.  Still coping with her husband's death.  Is doing better.  Has a friend.  This has helped.  She tries to stay active.  No cardiac symptoms with increased activity or exertion.  No sob.  No acid reflux.  No abdominal pain or cramping.  Bowels stable.  Has poison oak.  Took prednisone.  Better.  Still some rash right arm.    Past Medical History  Diagnosis Date  . Hypothyroidism   . Environmental allergies   . Hypercholesterolemia   . Arthritis   . GERD (gastroesophageal reflux disease)   . Esophagitis     [place], followed by Dr Vennie Homans   Past Surgical History  Procedure Laterality Date  . Ankle surgery  1967  . Wrist surgery  1970    cyst removal  . Rhinoplasty  1986  . Knee arthroscopy  12/03/07    left  . Knee arthroscopy  06/25/08    left  . Replacement total knee  01/04/09    left  . Nasal sinus surgery  12/06  . Vaginal hysterectomy  1980    ovaries left in place   Family History  Problem Relation Age of Onset  . Heart disease Father   . Hyperlipidemia Paternal Grandmother   . Hyperlipidemia Paternal Grandfather   . Prostate cancer Maternal Uncle   . Pancreatic cancer Cousin   . Multiple myeloma Brother   . Breast cancer Maternal Aunt   . Breast cancer Maternal Aunt    Social History   Social History  . Marital Status: Married    Spouse Name: N/A  . Number of Children: N/A  . Years of Education: N/A   Social History Main Topics  . Smoking status: Former Smoker -- 5 years    Types: Cigarettes  . Smokeless tobacco: Never Used  . Alcohol Use: No  . Drug Use: No  . Sexual Activity: Not Currently   Other Topics Concern  . None   Social History Narrative    Outpatient Encounter Prescriptions as of 12/22/2015    Medication Sig  . acetaminophen (TYLENOL) 500 MG tablet Take 500 mg by mouth every 6 (six) hours as needed.  Marland Kitchen BIOTIN 5000 PO Take 1 tablet by mouth daily.  . busPIRone (BUSPAR) 5 MG tablet Take 1 tablet (5 mg total) by mouth 2 (two) times daily.  . Cholecalciferol (VITAMIN D PO) Take 5,000 Units by mouth daily.  . Coenzyme Q10 60 MG TABS Take 120 mg by mouth daily.  . Eluxadoline (VIBERZI) 75 MG TABS Take 75 mg by mouth daily. Reported on 11/17/2015  . esomeprazole (NEXIUM) 40 MG capsule Take 40 mg by mouth daily as needed.   . Hypertonic Nasal Wash (SINUS RINSE NA) Place into the nose as needed.  . Lactobacillus (PROBIOTIC ACIDOPHILUS PO) Take 1,360 mg by mouth daily.  Marland Kitchen levothyroxine (LEVOTHROID) 75 MCG tablet Take 1 tablet (75 mcg total) by mouth every other day.  . levothyroxine (SYNTHROID, LEVOTHROID) 50 MCG tablet Take 1 tablet (50 mcg total) by mouth every other day.  . Multiple Vitamin (MULTIVITAMIN) LIQD Take 5 mLs by mouth daily.  . mupirocin ointment (BACTROBAN) 2 % Place 1 application into the  nose 2 (two) times daily.  . Omega-3 Fatty Acids (FISH OIL) 1360 MG CAPS Take by mouth daily.  Marland Kitchen omeprazole (PRILOSEC) 20 MG capsule Take 1 capsule (20 mg total) by mouth daily.  . Probiotic Product (PROBIOTIC DAILY PO) Take 1 tablet by mouth.  . rosuvastatin (CRESTOR) 5 MG tablet Take 1 tablet (5 mg total) by mouth daily.  Marland Kitchen triamcinolone (KENALOG) 0.025 % ointment Apply 1 application topically 2 (two) times daily.  . vitamin E 400 UNIT capsule Take 400 Units by mouth daily.  Marland Kitchen nystatin cream (MYCOSTATIN) Apply 1 application topically 2 (two) times daily.  . [DISCONTINUED] predniSONE (DELTASONE) 10 MG tablet Take 4 tablets ( total 40 mg) by mouth for 2 days; take 3 tablets ( total 30 mg) by mouth for 2 days; take 2 tablets (total 75m) by mouth for 2 days; then take 1 tablet ( total 151m by mouth for 2 days.   No facility-administered encounter medications on file as of 12/22/2015.     Review of Systems  Constitutional: Negative for appetite change and unexpected weight change.  HENT: Negative for congestion and sinus pressure.   Respiratory: Negative for cough, chest tightness and shortness of breath.   Cardiovascular: Negative for chest pain, palpitations and leg swelling.  Gastrointestinal: Negative for nausea, vomiting, abdominal pain and diarrhea.  Genitourinary: Negative for dysuria and difficulty urinating.  Musculoskeletal: Negative for back pain and joint swelling.  Skin: Positive for rash. Negative for color change.  Neurological: Negative for dizziness, light-headedness and headaches.  Psychiatric/Behavioral: Negative for dysphoric mood and agitation.       Objective:    Physical Exam  Constitutional: She appears well-developed and well-nourished. No distress.  HENT:  Nose: Nose normal.  Mouth/Throat: Oropharynx is clear and moist.  Neck: Neck supple. No thyromegaly present.  Cardiovascular: Normal rate and regular rhythm.   Pulmonary/Chest: Breath sounds normal. No respiratory distress. She has no wheezes.  Abdominal: Soft. Bowel sounds are normal. There is no tenderness.  Musculoskeletal: She exhibits no edema or tenderness.  Lymphadenopathy:    She has no cervical adenopathy.  Skin: Rash noted. No erythema.  Rash - right arm.    Psychiatric: She has a normal mood and affect. Her behavior is normal.    BP 110/70 mmHg  Pulse 65  Temp(Src) 98.2 F (36.8 C) (Oral)  Resp 18  Ht 5' 3" (1.6 m)  Wt 139 lb 8 oz (63.277 kg)  BMI 24.72 kg/m2  SpO2 97% Wt Readings from Last 3 Encounters:  12/22/15 139 lb 8 oz (63.277 kg)  12/08/15 143 lb 6.4 oz (65.046 kg)  11/17/15 144 lb (65.318 kg)     Lab Results  Component Value Date   WBC 5.3 12/23/2015   HGB 13.2 12/23/2015   HCT 39.4 12/23/2015   PLT 280.0 12/23/2015   GLUCOSE 87 12/23/2015   CHOL 199 12/23/2015   TRIG 133.0 12/23/2015   HDL 65.60 12/23/2015   LDLDIRECT 123.4 02/27/2013    LDLCALC 107* 12/23/2015   ALT 20 12/23/2015   AST 18 12/23/2015   NA 136 12/23/2015   K 4.4 12/23/2015   CL 109 12/23/2015   CREATININE 0.71 12/23/2015   BUN 14 12/23/2015   CO2 30 12/23/2015   TSH 4.52* 12/23/2015    Dexascan  07/06/2015  EXAM: DUAL X-RAY ABSORPTIOMETRY (DXA) FOR BONE MINERAL DENSITY IMPRESSION: Dear Dr. ScNicki ReaperYour patient Katrina Cobaughompleted a BMD test on 07/06/2015 using the [employer] Backanalysis version: 14.10) manufactured by  [employer]. The following summarizes the results of our evaluation. PATIENT BIOGRAPHICAL: Name: [name], [name] Patient ID: [id number] Birth Date: 03-23-1946 Height: 63.0 in. Gender: Female Exam Date: 07/06/2015 Weight: 150.0 lbs. Indications: Caucasian, Hypothyroid, Hysterectomy, Oophorectomy Unilateral, History of Fracture (Adult) Fractures: Left foot, Left lower leg, Right foot Treatments: Vitamin D ASSESSMENT: The BMD measured at Femur Neck Left is 0.736 g/cm2 with a T-score of -2.2. This patient is considered osteopenic according to [facility]) criteria. Site Region Measured Measured WHO Young Adult BMD Date       Age      Classification T-score AP Spine L1-L4 07/06/2015 70.3 Osteopenia -2.0 0.941 g/cm2 DualFemur Neck Left 07/06/2015 70.3 Osteopenia -2.2 0.736 g/cm2 World Health Organization [facility]) criteria for post-menopausal, Caucasian Women: Normal:       T-score at or above -1 SD Osteopenia:   T-score between -1 and -2.5 SD Osteoporosis: T-score at or below -2.5 SD RECOMMENDATIONS: [facility] recommends that FDA-approved medical therapies be considered in postmenopausal women and men age 19 or older with a: 1. Hip or vertebral (clinical or morphometric) fracture. 2. T-score of < -2.5 at the spine or hip. 3. Ten-year fracture probability by FRAX of 3% or greater for hip fracture or 20% or greater for major osteoporotic fracture. All treatment decisions require clinical judgment and consideration of  individual patient factors, including patient preferences, co-morbidities, previous drug use, risk factors not captured in the FRAX model (e.g. falls, vitamin D deficiency, increased bone turnover, interval significant decline in bone density) and possible under - or over-estimation of fracture risk by FRAX. All patients should ensure an adequate intake of dietary calcium (1200 mg/d) and vitamin D (800 IU daily) unless contraindicated. FOLLOW-UP: People with diagnosed cases of osteoporosis or at high risk for fracture should have regular bone mineral density tests. For patients eligible for Medicare, routine testing is allowed once every 2 years. The testing frequency can be increased to one year for patients who have rapidly progressing disease, those who are receiving or discontinuing medical therapy to restore bone mass, or have additional risk factors. I have reviewed this report, and agree with the above findings. [facility] Dear Dr. Nicki Reaper, Your patient [name] completed a FRAX assessment on 07/06/2015 using the [place] (analysis version: 14.10) manufactured by [employer]. The following summarizes the results of our evaluation. PATIENT BIOGRAPHICAL: Name: [name], [name] Patient ID: [id number] Birth Date: Oct 14, 1945 Height:    63.0 in. Gender:     Female    Age:        69.3       Weight:    150.0 lbs. Ethnicity:  White                            Exam Date: 07/06/2015 FRAX* RESULTS:  (version: 3.5) 10-year Probability of Fracture1 Major Osteoporotic Fracture2 Hip Fracture 20.2% 4.3% Population: [place] (Caucasian) Risk Factors: History of Fracture (Adult) Based on Femur (Left) Neck BMD 1 -The 10-year probability of fracture may be lower than reported if the patient has received treatment. 2 -Major Osteoporotic Fracture: Clinical Spine, Forearm, Hip or Shoulder *FRAX is a Materials engineer of the [employer] for Metabolic Bone Disease, a [place] (WHO) [facility]. ASSESSMENT: The probability of a major osteoporotic fracture is 20.2% within the next ten years. The probability of a hip fracture is 4.3% within the next ten years. Marland Kitchen  Electronically Signed   By: David  Martinique M.D.   On: 07/06/2015 13:33       Assessment & Plan:   Problem List Items Addressed This Visit    Diarrhea    Better.  Saw GI.  Feel related to increased stress.  Follow.  Continue probiotic.        GERD (gastroesophageal reflux disease)    Symptoms controlled on current regimen.  Had EGD 12/2014.        History of colonic polyps    Colonoscopy 04/2013 as outlined.  Recommended f/u in five years.       Hypercholesteremia    Low cholesterol diet and exercise.  Follow lipid panel and liver function tests. On crestor.        Relevant Orders   CBC with Differential/Platelet (Completed)   Lipid panel (Completed)   Hepatic function panel (Completed)   Basic metabolic panel (Completed)   Hypothyroidism    On thyroid replacement.  Follow tsh.        Relevant Orders   TSH (Completed)   Stress    Coping with her husband's death.  Doing better.  Follow.         Other Visit Diagnoses    Screening breast examination    -  Primary    Relevant Orders    MM DIGITAL SCREENING BILATERAL    Contact dermatitis        persistent minimal rash.  TCC as directed.  follow.          Einar Pheasant, MD

## 2015-12-22 NOTE — Progress Notes (Signed)
Pre-visit discussion using our clinic review tool. No additional management support is needed unless otherwise documented below in the visit note.  

## 2015-12-23 ENCOUNTER — Telehealth: Payer: Self-pay

## 2015-12-23 ENCOUNTER — Other Ambulatory Visit: Payer: Self-pay | Admitting: Internal Medicine

## 2015-12-23 ENCOUNTER — Other Ambulatory Visit (INDEPENDENT_AMBULATORY_CARE_PROVIDER_SITE_OTHER): Payer: PPO

## 2015-12-23 DIAGNOSIS — E7800 Pure hypercholesterolemia, unspecified: Secondary | ICD-10-CM

## 2015-12-23 DIAGNOSIS — E039 Hypothyroidism, unspecified: Secondary | ICD-10-CM

## 2015-12-23 LAB — CBC WITH DIFFERENTIAL/PLATELET
Basophils Absolute: 0 10*3/uL (ref 0.0–0.1)
Basophils Relative: 0.6 % (ref 0.0–3.0)
Eosinophils Absolute: 0.1 10*3/uL (ref 0.0–0.7)
Eosinophils Relative: 1.9 % (ref 0.0–5.0)
HCT: 39.4 % (ref 36.0–46.0)
Hemoglobin: 13.2 g/dL (ref 12.0–15.0)
Lymphocytes Relative: 27.7 % (ref 12.0–46.0)
Lymphs Abs: 1.5 10*3/uL (ref 0.7–4.0)
MCHC: 33.5 g/dL (ref 30.0–36.0)
MCV: 98.4 fl (ref 78.0–100.0)
Monocytes Absolute: 0.5 10*3/uL (ref 0.1–1.0)
Monocytes Relative: 10.4 % (ref 3.0–12.0)
Neutro Abs: 3.1 10*3/uL (ref 1.4–7.7)
Neutrophils Relative %: 59.4 % (ref 43.0–77.0)
Platelets: 280 10*3/uL (ref 150.0–400.0)
RBC: 4 Mil/uL (ref 3.87–5.11)
RDW: 13.9 % (ref 11.5–15.5)
WBC: 5.3 10*3/uL (ref 4.0–10.5)

## 2015-12-23 LAB — LIPID PANEL
Cholesterol: 199 mg/dL (ref 0–200)
HDL: 65.6 mg/dL
LDL Cholesterol: 107 mg/dL — ABNORMAL HIGH (ref 0–99)
NonHDL: 133.35
Total CHOL/HDL Ratio: 3
Triglycerides: 133 mg/dL (ref 0.0–149.0)
VLDL: 26.6 mg/dL (ref 0.0–40.0)

## 2015-12-23 LAB — BASIC METABOLIC PANEL WITH GFR
BUN: 14 mg/dL (ref 6–23)
CO2: 30 meq/L (ref 19–32)
Calcium: 9.7 mg/dL (ref 8.4–10.5)
Chloride: 109 meq/L (ref 96–112)
Creatinine, Ser: 0.71 mg/dL (ref 0.40–1.20)
GFR: 86.3 mL/min
Glucose, Bld: 87 mg/dL (ref 70–99)
Potassium: 4.4 meq/L (ref 3.5–5.1)
Sodium: 136 meq/L (ref 135–145)

## 2015-12-23 LAB — HEPATIC FUNCTION PANEL
ALT: 20 U/L (ref 0–35)
AST: 18 U/L (ref 0–37)
Albumin: 4.3 g/dL (ref 3.5–5.2)
Alkaline Phosphatase: 62 U/L (ref 39–117)
Bilirubin, Direct: 0.1 mg/dL (ref 0.0–0.3)
Total Bilirubin: 0.5 mg/dL (ref 0.2–1.2)
Total Protein: 6.8 g/dL (ref 6.0–8.3)

## 2015-12-23 LAB — TSH: TSH: 4.52 u[IU]/mL — ABNORMAL HIGH (ref 0.35–4.50)

## 2015-12-23 NOTE — Telephone Encounter (Signed)
Noted! Thank you

## 2015-12-23 NOTE — Progress Notes (Signed)
Order placed for f/u tsh.  

## 2015-12-23 NOTE — Telephone Encounter (Signed)
Pt came for lab only visit today. Please place lab orders. Thank you.

## 2015-12-23 NOTE — Telephone Encounter (Signed)
Orders placed for labs

## 2015-12-25 ENCOUNTER — Encounter: Payer: Self-pay | Admitting: *Deleted

## 2015-12-28 ENCOUNTER — Encounter: Payer: Self-pay | Admitting: Internal Medicine

## 2015-12-28 NOTE — Assessment & Plan Note (Signed)
Coping with her husband's death.  Doing better.  Follow.

## 2015-12-28 NOTE — Assessment & Plan Note (Signed)
Symptoms controlled on current regimen.  Had EGD 12/2014.

## 2015-12-28 NOTE — Assessment & Plan Note (Signed)
Colonoscopy 04/2013 as outlined.  Recommended f/u in five years.

## 2015-12-28 NOTE — Assessment & Plan Note (Signed)
On thyroid replacement.  Follow tsh.  

## 2015-12-28 NOTE — Assessment & Plan Note (Signed)
Better.  Saw GI.  Feel related to increased stress.  Follow.  Continue probiotic.

## 2015-12-28 NOTE — Assessment & Plan Note (Signed)
Low cholesterol diet and exercise.  Follow lipid panel and liver function tests.  On crestor.   

## 2016-01-13 ENCOUNTER — Telehealth: Payer: Self-pay | Admitting: *Deleted

## 2016-01-13 MED ORDER — LEVOTHYROXINE SODIUM 75 MCG PO TABS: 75.0000 ug | ORAL_TABLET | ORAL | 1 refills | Status: DC

## 2016-01-13 NOTE — Telephone Encounter (Signed)
Rx sent, thanks 

## 2016-01-13 NOTE — Telephone Encounter (Signed)
Patient requested a medication refill for levothyroxine 75 mg Pharmacy [facility]

## 2016-01-15 ENCOUNTER — Other Ambulatory Visit: Payer: Self-pay

## 2016-01-15 MED ORDER — OMEPRAZOLE 20 MG PO CPDR: 20.0000 mg | DELAYED_RELEASE_CAPSULE | Freq: Every day | ORAL | 1 refills | Status: DC

## 2016-02-02 ENCOUNTER — Other Ambulatory Visit (INDEPENDENT_AMBULATORY_CARE_PROVIDER_SITE_OTHER): Payer: PPO

## 2016-02-02 DIAGNOSIS — M5033 Other cervical disc degeneration, cervicothoracic region: Secondary | ICD-10-CM | POA: Diagnosis not present

## 2016-02-02 DIAGNOSIS — Z23 Encounter for immunization: Secondary | ICD-10-CM | POA: Diagnosis not present

## 2016-02-02 DIAGNOSIS — E039 Hypothyroidism, unspecified: Secondary | ICD-10-CM

## 2016-02-02 DIAGNOSIS — M9901 Segmental and somatic dysfunction of cervical region: Secondary | ICD-10-CM | POA: Diagnosis not present

## 2016-02-02 DIAGNOSIS — M9903 Segmental and somatic dysfunction of lumbar region: Secondary | ICD-10-CM | POA: Diagnosis not present

## 2016-02-02 DIAGNOSIS — M5412 Radiculopathy, cervical region: Secondary | ICD-10-CM | POA: Diagnosis not present

## 2016-02-02 LAB — TSH: TSH: 0.33 u[IU]/mL — ABNORMAL LOW (ref 0.35–4.50)

## 2016-02-03 ENCOUNTER — Telehealth: Payer: Self-pay | Admitting: *Deleted

## 2016-02-03 DIAGNOSIS — M9903 Segmental and somatic dysfunction of lumbar region: Secondary | ICD-10-CM | POA: Diagnosis not present

## 2016-02-03 DIAGNOSIS — M9901 Segmental and somatic dysfunction of cervical region: Secondary | ICD-10-CM | POA: Diagnosis not present

## 2016-02-03 DIAGNOSIS — M5412 Radiculopathy, cervical region: Secondary | ICD-10-CM | POA: Diagnosis not present

## 2016-02-03 DIAGNOSIS — M5033 Other cervical disc degeneration, cervicothoracic region: Secondary | ICD-10-CM | POA: Diagnosis not present

## 2016-02-03 NOTE — Telephone Encounter (Signed)
Pt to restart alternating between the 32mcg & 51mcg of thyroid medication QOD.

## 2016-02-04 ENCOUNTER — Ambulatory Visit: Payer: PPO

## 2016-02-04 ENCOUNTER — Other Ambulatory Visit: Payer: PPO

## 2016-02-05 DIAGNOSIS — M5412 Radiculopathy, cervical region: Secondary | ICD-10-CM | POA: Diagnosis not present

## 2016-02-05 DIAGNOSIS — M9901 Segmental and somatic dysfunction of cervical region: Secondary | ICD-10-CM | POA: Diagnosis not present

## 2016-02-05 DIAGNOSIS — M9903 Segmental and somatic dysfunction of lumbar region: Secondary | ICD-10-CM | POA: Diagnosis not present

## 2016-02-05 DIAGNOSIS — M5033 Other cervical disc degeneration, cervicothoracic region: Secondary | ICD-10-CM | POA: Diagnosis not present

## 2016-02-08 ENCOUNTER — Other Ambulatory Visit: Payer: Self-pay | Admitting: Internal Medicine

## 2016-02-08 ENCOUNTER — Ambulatory Visit
Admission: RE | Admit: 2016-02-08 | Discharge: 2016-02-08 | Disposition: A | Discharge status: Home / Self Care | Payer: PPO | Source: Ambulatory Visit | Attending: Internal Medicine | Admitting: Internal Medicine

## 2016-02-08 DIAGNOSIS — Z1239 Encounter for other screening for malignant neoplasm of breast: Secondary | ICD-10-CM

## 2016-02-08 DIAGNOSIS — Z1231 Encounter for screening mammogram for malignant neoplasm of breast: Secondary | ICD-10-CM | POA: Diagnosis present

## 2016-02-09 DIAGNOSIS — M5033 Other cervical disc degeneration, cervicothoracic region: Secondary | ICD-10-CM | POA: Diagnosis not present

## 2016-02-09 DIAGNOSIS — M9901 Segmental and somatic dysfunction of cervical region: Secondary | ICD-10-CM | POA: Diagnosis not present

## 2016-02-09 DIAGNOSIS — M9903 Segmental and somatic dysfunction of lumbar region: Secondary | ICD-10-CM | POA: Diagnosis not present

## 2016-02-09 DIAGNOSIS — M5412 Radiculopathy, cervical region: Secondary | ICD-10-CM | POA: Diagnosis not present

## 2016-02-10 DIAGNOSIS — M9901 Segmental and somatic dysfunction of cervical region: Secondary | ICD-10-CM | POA: Diagnosis not present

## 2016-02-10 DIAGNOSIS — M9903 Segmental and somatic dysfunction of lumbar region: Secondary | ICD-10-CM | POA: Diagnosis not present

## 2016-02-10 DIAGNOSIS — M5033 Other cervical disc degeneration, cervicothoracic region: Secondary | ICD-10-CM | POA: Diagnosis not present

## 2016-02-10 DIAGNOSIS — M5412 Radiculopathy, cervical region: Secondary | ICD-10-CM | POA: Diagnosis not present

## 2016-02-12 DIAGNOSIS — M5033 Other cervical disc degeneration, cervicothoracic region: Secondary | ICD-10-CM | POA: Diagnosis not present

## 2016-02-12 DIAGNOSIS — M5412 Radiculopathy, cervical region: Secondary | ICD-10-CM | POA: Diagnosis not present

## 2016-02-12 DIAGNOSIS — M9901 Segmental and somatic dysfunction of cervical region: Secondary | ICD-10-CM | POA: Diagnosis not present

## 2016-02-12 DIAGNOSIS — M9903 Segmental and somatic dysfunction of lumbar region: Secondary | ICD-10-CM | POA: Diagnosis not present

## 2016-02-16 DIAGNOSIS — M9903 Segmental and somatic dysfunction of lumbar region: Secondary | ICD-10-CM | POA: Diagnosis not present

## 2016-02-16 DIAGNOSIS — M9901 Segmental and somatic dysfunction of cervical region: Secondary | ICD-10-CM | POA: Diagnosis not present

## 2016-02-16 DIAGNOSIS — M5412 Radiculopathy, cervical region: Secondary | ICD-10-CM | POA: Diagnosis not present

## 2016-02-16 DIAGNOSIS — M5033 Other cervical disc degeneration, cervicothoracic region: Secondary | ICD-10-CM | POA: Diagnosis not present

## 2016-02-17 DIAGNOSIS — M9903 Segmental and somatic dysfunction of lumbar region: Secondary | ICD-10-CM | POA: Diagnosis not present

## 2016-02-17 DIAGNOSIS — M5033 Other cervical disc degeneration, cervicothoracic region: Secondary | ICD-10-CM | POA: Diagnosis not present

## 2016-02-17 DIAGNOSIS — M5412 Radiculopathy, cervical region: Secondary | ICD-10-CM | POA: Diagnosis not present

## 2016-02-17 DIAGNOSIS — M9901 Segmental and somatic dysfunction of cervical region: Secondary | ICD-10-CM | POA: Diagnosis not present

## 2016-02-19 DIAGNOSIS — M9903 Segmental and somatic dysfunction of lumbar region: Secondary | ICD-10-CM | POA: Diagnosis not present

## 2016-02-19 DIAGNOSIS — M5412 Radiculopathy, cervical region: Secondary | ICD-10-CM | POA: Diagnosis not present

## 2016-02-19 DIAGNOSIS — M5033 Other cervical disc degeneration, cervicothoracic region: Secondary | ICD-10-CM | POA: Diagnosis not present

## 2016-02-19 DIAGNOSIS — M9901 Segmental and somatic dysfunction of cervical region: Secondary | ICD-10-CM | POA: Diagnosis not present

## 2016-02-23 DIAGNOSIS — M5412 Radiculopathy, cervical region: Secondary | ICD-10-CM | POA: Diagnosis not present

## 2016-02-23 DIAGNOSIS — M9903 Segmental and somatic dysfunction of lumbar region: Secondary | ICD-10-CM | POA: Diagnosis not present

## 2016-02-23 DIAGNOSIS — M9901 Segmental and somatic dysfunction of cervical region: Secondary | ICD-10-CM | POA: Diagnosis not present

## 2016-02-23 DIAGNOSIS — M5033 Other cervical disc degeneration, cervicothoracic region: Secondary | ICD-10-CM | POA: Diagnosis not present

## 2016-02-24 DIAGNOSIS — M5412 Radiculopathy, cervical region: Secondary | ICD-10-CM | POA: Diagnosis not present

## 2016-02-24 DIAGNOSIS — M5033 Other cervical disc degeneration, cervicothoracic region: Secondary | ICD-10-CM | POA: Diagnosis not present

## 2016-02-24 DIAGNOSIS — M9901 Segmental and somatic dysfunction of cervical region: Secondary | ICD-10-CM | POA: Diagnosis not present

## 2016-02-24 DIAGNOSIS — M9903 Segmental and somatic dysfunction of lumbar region: Secondary | ICD-10-CM | POA: Diagnosis not present

## 2016-02-29 DIAGNOSIS — M5412 Radiculopathy, cervical region: Secondary | ICD-10-CM | POA: Diagnosis not present

## 2016-02-29 DIAGNOSIS — M5033 Other cervical disc degeneration, cervicothoracic region: Secondary | ICD-10-CM | POA: Diagnosis not present

## 2016-02-29 DIAGNOSIS — M9901 Segmental and somatic dysfunction of cervical region: Secondary | ICD-10-CM | POA: Diagnosis not present

## 2016-02-29 DIAGNOSIS — M9903 Segmental and somatic dysfunction of lumbar region: Secondary | ICD-10-CM | POA: Diagnosis not present

## 2016-03-01 ENCOUNTER — Encounter: Payer: Self-pay | Admitting: Family

## 2016-03-01 ENCOUNTER — Telehealth: Payer: Self-pay

## 2016-03-01 ENCOUNTER — Ambulatory Visit (INDEPENDENT_AMBULATORY_CARE_PROVIDER_SITE_OTHER): Payer: PPO | Admitting: Family

## 2016-03-01 VITALS — BP 110/60 | HR 73 | Temp 97.9°F | Wt 139.2 lb

## 2016-03-01 DIAGNOSIS — R300 Dysuria: Secondary | ICD-10-CM

## 2016-03-01 DIAGNOSIS — N3001 Acute cystitis with hematuria: Secondary | ICD-10-CM

## 2016-03-01 LAB — POCT URINALYSIS DIPSTICK
Blood, UA: NEGATIVE
Glucose, UA: NEGATIVE
Nitrite, UA: NEGATIVE
Urobilinogen, UA: 0.2
pH, UA: 5

## 2016-03-01 MED ORDER — NITROFURANTOIN MONOHYD MACRO 100 MG PO CAPS: 100.0000 mg | ORAL_CAPSULE | Freq: Two times a day (BID) | ORAL | 0 refills | Status: DC

## 2016-03-01 NOTE — Progress Notes (Signed)
Subjective:    Patient ID: [name], female    DOB: 1945/12/09, 70 y.o.   MRN: [id number]  CC: [name] is a 70 y.o. female who presents today for an acute visit.    HPI: Patient presents for acute visit with chief complaint of urinary frequency. No fever, chills. No concern for STDs or frequent UTIs. Had a new partner and has been douching after sex. Had vaginal itching with thick white discharge a week ago and resolved with vinegar and water. Endorses chronic low back pain ( sees chiropractor). No hematuria.       HISTORY:  Past Medical History:  Diagnosis Date  . Arthritis   . Environmental allergies   . Esophagitis    [place], followed by Dr Vennie Homans  . GERD (gastroesophageal reflux disease)   . Hypercholesterolemia   . Hypothyroidism    Past Surgical History:  Procedure Laterality Date  . [place]  . KNEE ARTHROSCOPY  12/03/07   left  . KNEE ARTHROSCOPY  06/25/08   left  . NASAL SINUS SURGERY  12/06  . REPLACEMENT TOTAL KNEE  01/04/09   left  . RHINOPLASTY  1986  . VAGINAL HYSTERECTOMY  1980   ovaries left in place  . WRIST SURGERY  1970   cyst removal   Family History  Problem Relation Age of Onset  . Heart disease Father   . Hyperlipidemia Paternal Grandmother   . Hyperlipidemia Paternal Grandfather   . Prostate cancer Maternal Uncle   . Pancreatic cancer Cousin   . Multiple myeloma Brother   . Breast cancer Maternal Aunt   . Breast cancer Maternal Aunt     Allergies: Diclofenac; Ibuprofen; Tramadol; Hydromorphone; and Oxycodone Current Outpatient Prescriptions on File Prior to Visit  Medication Sig Dispense Refill  . acetaminophen (TYLENOL) 500 MG tablet Take 500 mg by mouth every 6 (six) hours as needed.    Marland Kitchen BIOTIN 5000 PO Take 1 tablet by mouth daily.    . busPIRone (BUSPAR) 5 MG tablet Take 1 tablet (5 mg total) by mouth 2 (two) times daily. 180 tablet 1  . Cholecalciferol (VITAMIN D PO) Take 5,000 Units by mouth daily.    .  Coenzyme Q10 60 MG TABS Take 120 mg by mouth daily.    . Eluxadoline (VIBERZI) 75 MG TABS Take 75 mg by mouth daily. Reported on 11/17/2015    . esomeprazole (NEXIUM) 40 MG capsule Take 40 mg by mouth daily as needed.     . Hypertonic Nasal Wash (SINUS RINSE NA) Place into the nose as needed.    . Lactobacillus (PROBIOTIC ACIDOPHILUS PO) Take 1,360 mg by mouth daily.    Marland Kitchen levothyroxine (LEVOTHROID) 75 MCG tablet Take 1 tablet (75 mcg total) by mouth every other day. 90 tablet 1  . levothyroxine (SYNTHROID, LEVOTHROID) 50 MCG tablet Take 1 tablet (50 mcg total) by mouth every other day. 90 tablet 1  . Multiple Vitamin (MULTIVITAMIN) LIQD Take 5 mLs by mouth daily.    . mupirocin ointment (BACTROBAN) 2 % Place 1 application into the nose 2 (two) times daily. 22 g 0  . nystatin cream (MYCOSTATIN) Apply 1 application topically 2 (two) times daily. 30 g 0  . Omega-3 Fatty Acids (FISH OIL) 1360 MG CAPS Take by mouth daily.    Marland Kitchen omeprazole (PRILOSEC) 20 MG capsule Take 1 capsule (20 mg total) by mouth daily. 90 capsule 1  . Probiotic Product (PROBIOTIC DAILY PO) Take 1 tablet  by mouth.    . rosuvastatin (CRESTOR) 5 MG tablet Take 1 tablet (5 mg total) by mouth daily. 90 tablet 3  . triamcinolone (KENALOG) 0.025 % ointment Apply 1 application topically 2 (two) times daily. 30 g 0  . vitamin E 400 UNIT capsule Take 400 Units by mouth daily.     No current facility-administered medications on file prior to visit.     Social History  Substance Use Topics  . Smoking status: Former Smoker    Years: 5.00    Types: Cigarettes  . Smokeless tobacco: Never Used  . Alcohol use No    Review of Systems  Constitutional: Negative for chills and fever.  Respiratory: Negative for cough.   Cardiovascular: Negative for chest pain and palpitations.  Gastrointestinal: Negative for nausea and vomiting.  Genitourinary: Positive for frequency. Negative for dysuria, flank pain, hematuria, pelvic pain, vaginal  bleeding, vaginal discharge and vaginal pain.      Objective:    BP 110/60   Pulse 73   Temp 97.9 F (36.6 C) (Oral)   Wt 139 lb 3.2 oz (63.1 kg)   SpO2 99%   BMI 24.66 kg/m    Physical Exam  Constitutional: She appears well-developed and well-nourished.  Cardiovascular: Normal rate, regular rhythm, normal heart sounds and normal pulses.   Pulmonary/Chest: Effort normal and breath sounds normal. She has no wheezes. She has no rhonchi. She has no rales.  Abdominal: There is no CVA tenderness.  Neurological: She is alert.  Skin: Skin is warm and dry.  Psychiatric: She has a normal mood and affect. Her speech is normal and behavior is normal. Thought content normal.  Vitals reviewed.      Assessment & Plan:   1. Dysuria/2. Acute cystitis with hematuria Positive for trace leukocytes, trace ketones, bilirubin. Pending culture. Will treat Empirically.Advised to stop douching. Vaginal itching and increased vaginal discharge has resolved. Patient platelet declined pelvic exam at this time. If vaginal symptoms recur, patient will give [place] a call and schedule office visit for pelvic exam.Politely declines STD testing.  - POCT Urinalysis Dipstick - Urine culture  - nitrofurantoin, macrocrystal-monohydrate, (MACROBID) 100 MG capsule; Take 1 capsule (100 mg total) by mouth 2 (two) times daily. Take with food.  Dispense: 10 capsule; Refill: 0    I am having Ms. Lissa Merlin start on nitrofurantoin (macrocrystal-monohydrate). I am also having her maintain her acetaminophen, multivitamin, Coenzyme Q10, Lactobacillus (PROBIOTIC ACIDOPHILUS PO), Fish Oil, Hypertonic Nasal Wash (SINUS RINSE NA), Cholecalciferol (VITAMIN D PO), esomeprazole, Probiotic Product (PROBIOTIC DAILY PO), vitamin E, BIOTIN 5000 PO, levothyroxine, Eluxadoline, rosuvastatin, busPIRone, mupirocin ointment, triamcinolone, nystatin cream, levothyroxine, and omeprazole.   Meds ordered this encounter  Medications  . nitrofurantoin,  macrocrystal-monohydrate, (MACROBID) 100 MG capsule    Sig: Take 1 capsule (100 mg total) by mouth 2 (two) times daily. Take with food.    Dispense:  10 capsule    Refill:  0    Order Specific Question:   Supervising Provider    Answer:   Crecencio Mc [[id number]]    Return precautions given.   Risks, benefits, and alternatives of the medications and treatment plan prescribed today were discussed, and patient expressed understanding.   Education regarding symptom management and diagnosis given to patient on AVS.  Continue to follow with Einar Pheasant, MD for routine health maintenance.   [name] and I agreed with plan.   Mable Paris, FNP

## 2016-03-01 NOTE — Telephone Encounter (Signed)
Submitted PA for Macrobid, awaiting results

## 2016-03-01 NOTE — Patient Instructions (Signed)
Drink plenty of water and take antibiotic as prescribed.   We are pending the urine culture to know the organism causing the infection and our office will call you with results. If the particular organism requires a different antibiotic than the on prescribed, we will place an order for a new prescription at that time.   If you symptoms worsen or you have new symptoms, please contact our office, or return to clinic for re evaluation.  Urinary Tract Infection Urinary tract infections (UTIs) can develop anywhere along your urinary tract. Your urinary tract is your body's drainage system for removing wastes and extra water. Your urinary tract includes two kidneys, two ureters, a bladder, and a urethra. Your kidneys are a pair of bean-shaped organs. Each kidney is about the size of your fist. They are located below your ribs, one on each side of your spine. CAUSES Infections are caused by microbes, which are microscopic organisms, including fungi, viruses, and bacteria. These organisms are so small that they can only be seen through a microscope. Bacteria are the microbes that most commonly cause UTIs. SYMPTOMS  Symptoms of UTIs may vary by age and gender of the patient and by the location of the infection. Symptoms in young women typically include a frequent and intense urge to urinate and a painful, burning feeling in the bladder or urethra during urination. Older women and men are more likely to be tired, shaky, and weak and have muscle aches and abdominal pain. A fever may mean the infection is in your kidneys. Other symptoms of a kidney infection include pain in your back or sides below the ribs, nausea, and vomiting. DIAGNOSIS To diagnose a UTI, your caregiver will ask you about your symptoms. Your caregiver will also ask you to provide a urine sample. The urine sample will be tested for bacteria and white blood cells. White blood cells are made by your body to help fight infection. TREATMENT    Typically, UTIs can be treated with medication. Because most UTIs are caused by a bacterial infection, they usually can be treated with the use of antibiotics. The choice of antibiotic and length of treatment depend on your symptoms and the type of bacteria causing your infection. HOME CARE INSTRUCTIONS  If you were prescribed antibiotics, take them exactly as your caregiver instructs you. Finish the medication even if you feel better after you have only taken some of the medication.  Drink enough water and fluids to keep your urine clear or pale yellow.  Avoid caffeine, tea, and carbonated beverages. They tend to irritate your bladder.  Empty your bladder often. Avoid holding urine for long periods of time.  Empty your bladder before and after sexual intercourse.  After a bowel movement, women should cleanse from front to back. Use each tissue only once. SEEK MEDICAL CARE IF:   You have back pain.  You develop a fever.  Your symptoms do not begin to resolve within 3 days. SEEK IMMEDIATE MEDICAL CARE IF:   You have severe back pain or lower abdominal pain.  You develop chills.  You have nausea or vomiting.  You have continued burning or discomfort with urination. MAKE SURE YOU:   Understand these instructions.  Will watch your condition.  Will get help right away if you are not doing well or get worse.   This information is not intended to replace advice given to you by your health care provider. Make sure you discuss any questions you have with your health care   provider.   Document Released: 03/09/2005 Document Revised: 02/18/2015 Document Reviewed: 07/08/2011 Elsevier Interactive Patient Education 2016 [employer] Inc.    

## 2016-03-01 NOTE — Progress Notes (Signed)
Pre visit review using our clinic review tool, if applicable. No additional management support is needed unless otherwise documented below in the visit note. 

## 2016-03-02 DIAGNOSIS — M5412 Radiculopathy, cervical region: Secondary | ICD-10-CM | POA: Diagnosis not present

## 2016-03-02 DIAGNOSIS — M5033 Other cervical disc degeneration, cervicothoracic region: Secondary | ICD-10-CM | POA: Diagnosis not present

## 2016-03-02 DIAGNOSIS — M9901 Segmental and somatic dysfunction of cervical region: Secondary | ICD-10-CM | POA: Diagnosis not present

## 2016-03-02 DIAGNOSIS — M9903 Segmental and somatic dysfunction of lumbar region: Secondary | ICD-10-CM | POA: Diagnosis not present

## 2016-03-02 LAB — URINE CULTURE

## 2016-03-03 DIAGNOSIS — M5412 Radiculopathy, cervical region: Secondary | ICD-10-CM | POA: Diagnosis not present

## 2016-03-03 DIAGNOSIS — M5033 Other cervical disc degeneration, cervicothoracic region: Secondary | ICD-10-CM | POA: Diagnosis not present

## 2016-03-03 DIAGNOSIS — M9901 Segmental and somatic dysfunction of cervical region: Secondary | ICD-10-CM | POA: Diagnosis not present

## 2016-03-03 DIAGNOSIS — M9903 Segmental and somatic dysfunction of lumbar region: Secondary | ICD-10-CM | POA: Diagnosis not present

## 2016-03-08 ENCOUNTER — Telehealth: Payer: Self-pay | Admitting: *Deleted

## 2016-03-08 DIAGNOSIS — M9901 Segmental and somatic dysfunction of cervical region: Secondary | ICD-10-CM | POA: Diagnosis not present

## 2016-03-08 DIAGNOSIS — M9903 Segmental and somatic dysfunction of lumbar region: Secondary | ICD-10-CM | POA: Diagnosis not present

## 2016-03-08 DIAGNOSIS — M5033 Other cervical disc degeneration, cervicothoracic region: Secondary | ICD-10-CM | POA: Diagnosis not present

## 2016-03-08 DIAGNOSIS — M5412 Radiculopathy, cervical region: Secondary | ICD-10-CM | POA: Diagnosis not present

## 2016-03-08 MED ORDER — ROSUVASTATIN CALCIUM 5 MG PO TABS: 5.0000 mg | ORAL_TABLET | Freq: Every day | ORAL | 1 refills | Status: DC

## 2016-03-08 NOTE — Telephone Encounter (Signed)
RX was sent to new pharmacy with remainder of refills from her previous rx sent to local pharmacy.

## 2016-03-08 NOTE — Telephone Encounter (Signed)
Patient requested a medication refill for rosuvastatin  [facility]

## 2016-03-10 DIAGNOSIS — M9903 Segmental and somatic dysfunction of lumbar region: Secondary | ICD-10-CM | POA: Diagnosis not present

## 2016-03-10 DIAGNOSIS — M5033 Other cervical disc degeneration, cervicothoracic region: Secondary | ICD-10-CM | POA: Diagnosis not present

## 2016-03-10 DIAGNOSIS — M9901 Segmental and somatic dysfunction of cervical region: Secondary | ICD-10-CM | POA: Diagnosis not present

## 2016-03-10 DIAGNOSIS — M5412 Radiculopathy, cervical region: Secondary | ICD-10-CM | POA: Diagnosis not present

## 2016-03-14 DIAGNOSIS — M9903 Segmental and somatic dysfunction of lumbar region: Secondary | ICD-10-CM | POA: Diagnosis not present

## 2016-03-14 DIAGNOSIS — M9901 Segmental and somatic dysfunction of cervical region: Secondary | ICD-10-CM | POA: Diagnosis not present

## 2016-03-14 DIAGNOSIS — M5412 Radiculopathy, cervical region: Secondary | ICD-10-CM | POA: Diagnosis not present

## 2016-03-14 DIAGNOSIS — M5033 Other cervical disc degeneration, cervicothoracic region: Secondary | ICD-10-CM | POA: Diagnosis not present

## 2016-03-15 ENCOUNTER — Other Ambulatory Visit: Payer: PPO

## 2016-03-18 ENCOUNTER — Telehealth: Payer: Self-pay

## 2016-03-18 DIAGNOSIS — E039 Hypothyroidism, unspecified: Secondary | ICD-10-CM

## 2016-03-18 NOTE — Telephone Encounter (Addendum)
Pt coming for TSH re-check 03/21/16. Lab ordered.

## 2016-03-21 ENCOUNTER — Other Ambulatory Visit (INDEPENDENT_AMBULATORY_CARE_PROVIDER_SITE_OTHER): Payer: PPO

## 2016-03-21 ENCOUNTER — Other Ambulatory Visit: Payer: Self-pay | Admitting: Internal Medicine

## 2016-03-21 ENCOUNTER — Other Ambulatory Visit: Payer: PPO

## 2016-03-21 DIAGNOSIS — E039 Hypothyroidism, unspecified: Secondary | ICD-10-CM

## 2016-03-21 LAB — TSH: TSH: 4.94 u[IU]/mL — ABNORMAL HIGH (ref 0.35–4.50)

## 2016-03-21 NOTE — Progress Notes (Signed)
Order placed for f/u tsh.  

## 2016-03-22 DIAGNOSIS — M9903 Segmental and somatic dysfunction of lumbar region: Secondary | ICD-10-CM | POA: Diagnosis not present

## 2016-03-22 DIAGNOSIS — M9901 Segmental and somatic dysfunction of cervical region: Secondary | ICD-10-CM | POA: Diagnosis not present

## 2016-03-22 DIAGNOSIS — M5033 Other cervical disc degeneration, cervicothoracic region: Secondary | ICD-10-CM | POA: Diagnosis not present

## 2016-03-22 DIAGNOSIS — M5412 Radiculopathy, cervical region: Secondary | ICD-10-CM | POA: Diagnosis not present

## 2016-03-23 ENCOUNTER — Encounter: Payer: Self-pay | Admitting: Internal Medicine

## 2016-03-23 ENCOUNTER — Ambulatory Visit (INDEPENDENT_AMBULATORY_CARE_PROVIDER_SITE_OTHER): Payer: PPO | Admitting: Internal Medicine

## 2016-03-23 DIAGNOSIS — Z860100 Personal history of colon polyps, unspecified: Secondary | ICD-10-CM

## 2016-03-23 DIAGNOSIS — F439 Reaction to severe stress, unspecified: Secondary | ICD-10-CM | POA: Diagnosis not present

## 2016-03-23 DIAGNOSIS — E7800 Pure hypercholesterolemia, unspecified: Secondary | ICD-10-CM

## 2016-03-23 DIAGNOSIS — E039 Hypothyroidism, unspecified: Secondary | ICD-10-CM

## 2016-03-23 DIAGNOSIS — Z0000 Encounter for general adult medical examination without abnormal findings: Secondary | ICD-10-CM | POA: Diagnosis not present

## 2016-03-23 DIAGNOSIS — K2100 Gastro-esophageal reflux disease with esophagitis, without bleeding: Secondary | ICD-10-CM

## 2016-03-23 DIAGNOSIS — J019 Acute sinusitis, unspecified: Secondary | ICD-10-CM

## 2016-03-23 NOTE — Progress Notes (Signed)
Patient ID: [name], female   DOB: 23-Jan-1946, 70 y.o.   MRN: [id number]   Subjective:    Patient ID: [name], female    DOB: 23-May-1946, 70 y.o.   MRN: [id number]  HPI  Patient with past history of hypercholesterolemia and hypothyroidism.  She comes in today to follow up on these issues as well as for a complete physical exam. Has been having some sinus issues with previous nose bleeding.  Using saline rinse.  Is better.  Taking zyrtec.  Has been using flonase.  No chest pain.  No sob.  No acid reflux reported.  No abdominal pain or cramping.  Bowels stable.  Has been seeing chiropractor.  Having adjustments.  In a new relationship.  Is happy.  Planning to travel.     Past Medical History:  Diagnosis Date  . Arthritis   . Environmental allergies   . Esophagitis    [place], followed by Dr Vennie Homans  . GERD (gastroesophageal reflux disease)   . Hypercholesterolemia   . Hypothyroidism    Past Surgical History:  Procedure Laterality Date  . [place]  . KNEE ARTHROSCOPY  12/03/07   left  . KNEE ARTHROSCOPY  06/25/08   left  . NASAL SINUS SURGERY  12/06  . REPLACEMENT TOTAL KNEE  01/04/09   left  . RHINOPLASTY  1986  . VAGINAL HYSTERECTOMY  1980   ovaries left in place  . WRIST SURGERY  1970   cyst removal   Family History  Problem Relation Age of Onset  . Heart disease Father   . Hyperlipidemia Paternal Grandmother   . Hyperlipidemia Paternal Grandfather   . Prostate cancer Maternal Uncle   . Pancreatic cancer Cousin   . Multiple myeloma Brother   . Breast cancer Maternal Aunt   . Breast cancer Maternal Aunt    Social History   Social History  . Marital status: Widowed    Spouse name: N/A  . Number of children: N/A  . Years of education: N/A   Social History Main Topics  . Smoking status: Former Smoker    Years: 5.00    Types: Cigarettes  . Smokeless tobacco: Never Used  . Alcohol use No  . Drug use: No  . Sexual activity: Not Currently    Other Topics Concern  . None   Social History Narrative  . None    Outpatient Encounter Prescriptions as of 03/23/2016  Medication Sig  . acetaminophen (TYLENOL) 500 MG tablet Take 500 mg by mouth every 6 (six) hours as needed.  Marland Kitchen BIOTIN 5000 PO Take 1 tablet by mouth daily.  . busPIRone (BUSPAR) 5 MG tablet Take 1 tablet (5 mg total) by mouth 2 (two) times daily.  . Cholecalciferol (VITAMIN D PO) Take 5,000 Units by mouth daily.  . Coenzyme Q10 60 MG TABS Take 120 mg by mouth daily.  . Eluxadoline (VIBERZI) 75 MG TABS Take 75 mg by mouth daily. Reported on 11/17/2015  . esomeprazole (NEXIUM) 40 MG capsule Take 40 mg by mouth daily as needed.   . Hypertonic Nasal Wash (SINUS RINSE NA) Place into the nose as needed.  . Lactobacillus (PROBIOTIC ACIDOPHILUS PO) Take 1,360 mg by mouth daily.  Marland Kitchen levothyroxine (LEVOTHROID) 75 MCG tablet Take 1 tablet (75 mcg total) by mouth every other day.  . levothyroxine (SYNTHROID, LEVOTHROID) 50 MCG tablet Take 1 tablet (50 mcg total) by mouth every other day.  . Multiple Vitamin (MULTIVITAMIN) LIQD Take 5 mLs  by mouth daily.  . mupirocin ointment (BACTROBAN) 2 % Place 1 application into the nose 2 (two) times daily.  Marland Kitchen nystatin cream (MYCOSTATIN) Apply 1 application topically 2 (two) times daily.  . Omega-3 Fatty Acids (FISH OIL) 1360 MG CAPS Take by mouth daily.  Marland Kitchen omeprazole (PRILOSEC) 20 MG capsule Take 1 capsule (20 mg total) by mouth daily.  . Probiotic Product (PROBIOTIC DAILY PO) Take 1 tablet by mouth.  . rosuvastatin (CRESTOR) 5 MG tablet Take 1 tablet (5 mg total) by mouth daily.  Marland Kitchen triamcinolone (KENALOG) 0.025 % ointment Apply 1 application topically 2 (two) times daily.  . vitamin E 400 UNIT capsule Take 400 Units by mouth daily.  . [DISCONTINUED] nitrofurantoin, macrocrystal-monohydrate, (MACROBID) 100 MG capsule Take 1 capsule (100 mg total) by mouth 2 (two) times daily. Take with food.   No facility-administered encounter medications  on file as of 03/23/2016.     Review of Systems  Constitutional: Negative for appetite change and unexpected weight change.  HENT: Negative for congestion and sinus pressure.   Eyes: Negative for pain and visual disturbance.  Respiratory: Negative for cough, chest tightness and shortness of breath.   Cardiovascular: Negative for chest pain, palpitations and leg swelling.  Gastrointestinal: Negative for abdominal pain, diarrhea, nausea and vomiting.  Genitourinary: Negative for difficulty urinating and dysuria.  Musculoskeletal: Negative for joint swelling and myalgias.  Skin: Negative for color change and rash.  Neurological: Negative for dizziness, light-headedness and headaches.  Hematological: Negative for adenopathy. Does not bruise/bleed easily.  Psychiatric/Behavioral: Negative for agitation and dysphoric mood.       Objective:     Blood pressure rechecked by me:  114/70  Physical Exam  Constitutional: She is oriented to person, place, and time. She appears well-developed and well-nourished. No distress.  HENT:  Nose: Nose normal.  Mouth/Throat: Oropharynx is clear and moist.  Eyes: Right eye exhibits no discharge. Left eye exhibits no discharge. No scleral icterus.  Neck: Neck supple. No thyromegaly present.  Cardiovascular: Normal rate and regular rhythm.   Pulmonary/Chest: Breath sounds normal. No accessory muscle usage. No tachypnea. No respiratory distress. She has no decreased breath sounds. She has no wheezes. She has no rhonchi. Right breast exhibits no inverted nipple, no mass, no nipple discharge and no tenderness (no axillary adenopathy). Left breast exhibits no inverted nipple, no mass, no nipple discharge and no tenderness (no axilarry adenopathy).  Abdominal: Soft. Bowel sounds are normal. There is no tenderness.  Musculoskeletal: She exhibits no edema or tenderness.  Lymphadenopathy:    She has no cervical adenopathy.  Neurological: She is alert and oriented  to person, place, and time.  Skin: Skin is warm. No rash noted. No erythema.  Psychiatric: She has a normal mood and affect. Her behavior is normal.    BP 110/78   Pulse (!) 56   Temp 98.3 F (36.8 C) (Oral)   Ht _0  (1.6 m)   Wt 142 lb 9.6 oz (64.7 kg)   SpO2 96%   BMI 25.26 kg/m  Wt Readings from Last 3 Encounters:  03/23/16 142 lb 9.6 oz (64.7 kg)  03/01/16 139 lb 3.2 oz (63.1 kg)  12/22/15 139 lb 8 oz (63.3 kg)     Lab Results  Component Value Date   WBC 5.3 12/23/2015   HGB 13.2 12/23/2015   HCT 39.4 12/23/2015   PLT 280.0 12/23/2015   GLUCOSE 87 12/23/2015   CHOL 199 12/23/2015   TRIG 133.0 12/23/2015   HDL  65.60 12/23/2015   LDLDIRECT 123.4 02/27/2013   LDLCALC 107 (H) 12/23/2015   ALT 20 12/23/2015   AST 18 12/23/2015   NA 136 12/23/2015   K 4.4 12/23/2015   CL 109 12/23/2015   CREATININE 0.71 12/23/2015   BUN 14 12/23/2015   CO2 30 12/23/2015   TSH 4.94 (H) 03/21/2016    Mm Screening Breast Tomo Bilateral  Result Date: 02/08/2016 CLINICAL DATA:  Screening. EXAM: 2D DIGITAL SCREENING BILATERAL MAMMOGRAM WITH CAD AND ADJUNCT TOMO COMPARISON:  Previous exam(s). ACR Breast Density Category c: The breast tissue is heterogeneously dense, which may obscure small masses. FINDINGS: There are no findings suspicious for malignancy. Images were processed with CAD. IMPRESSION: No mammographic evidence of malignancy. A result letter of this screening mammogram will be mailed directly to the patient. RECOMMENDATION: Screening mammogram in one year. (Code:SM-B-01Y) BI-RADS CATEGORY  1: Negative. Electronically Signed   By: Lovey Newcomer M.D.   On: 02/08/2016 13:25       Assessment & Plan:   Problem List Items Addressed This Visit    Acute sinusitis    Better on current regimen.  Follow.       GERD (gastroesophageal reflux disease)    Controlled on current regimen.  Followed by GI.       Health care maintenance    Physical today 03/23/16.  Mammogram 02/08/16 -  Birads I.  Colonoscopy 04/2013.  Recommended f/u colonoscopy in five years.        History of colonic polyps    Colonoscopy 04/2013 as outlined.  Recommended f/u in five years.        Hypercholesteremia    On crestor.  Low cholesterol diet and exercise.  Follow lipid panel and liver function tests.        Relevant Orders   Basic metabolic panel   Hepatic function panel   Lipid panel   Hypothyroidism    On thyroid replacement.  Follow tsh.        Relevant Orders   TSH   Stress    Doing better.  Happy in her new relationship.  Follow.         Other Visit Diagnoses   None.      Einar Pheasant, MD

## 2016-03-23 NOTE — Progress Notes (Signed)
Pre visit review using our clinic review tool, if applicable. No additional management support is needed unless otherwise documented below in the visit note. 

## 2016-03-23 NOTE — Assessment & Plan Note (Signed)
Physical today 03/23/16.  Mammogram 02/08/16 - Birads I.  Colonoscopy 04/2013.  Recommended f/u colonoscopy in five years.

## 2016-03-24 ENCOUNTER — Encounter: Payer: Medicare Other | Admitting: Internal Medicine

## 2016-04-03 ENCOUNTER — Encounter: Payer: Self-pay | Admitting: Internal Medicine

## 2016-04-03 NOTE — Assessment & Plan Note (Signed)
Controlled on current regimen.  Followed by GI.

## 2016-04-03 NOTE — Assessment & Plan Note (Signed)
On thyroid replacement.  Follow tsh.  

## 2016-04-03 NOTE — Assessment & Plan Note (Signed)
Colonoscopy 04/2013 as outlined.  Recommended f/u in five years.

## 2016-04-03 NOTE — Assessment & Plan Note (Signed)
Doing better.  Happy in her new relationship.  Follow.

## 2016-04-03 NOTE — Assessment & Plan Note (Signed)
On crestor.  Low cholesterol diet and exercise.  Follow lipid panel and liver function tests.   

## 2016-04-03 NOTE — Assessment & Plan Note (Signed)
Better on current regimen.  Follow.  

## 2016-04-06 DIAGNOSIS — M9903 Segmental and somatic dysfunction of lumbar region: Secondary | ICD-10-CM | POA: Diagnosis not present

## 2016-04-06 DIAGNOSIS — M5033 Other cervical disc degeneration, cervicothoracic region: Secondary | ICD-10-CM | POA: Diagnosis not present

## 2016-04-06 DIAGNOSIS — M5412 Radiculopathy, cervical region: Secondary | ICD-10-CM | POA: Diagnosis not present

## 2016-04-06 DIAGNOSIS — M9901 Segmental and somatic dysfunction of cervical region: Secondary | ICD-10-CM | POA: Diagnosis not present

## 2016-04-13 DIAGNOSIS — M9903 Segmental and somatic dysfunction of lumbar region: Secondary | ICD-10-CM | POA: Diagnosis not present

## 2016-04-13 DIAGNOSIS — M5033 Other cervical disc degeneration, cervicothoracic region: Secondary | ICD-10-CM | POA: Diagnosis not present

## 2016-04-13 DIAGNOSIS — M5412 Radiculopathy, cervical region: Secondary | ICD-10-CM | POA: Diagnosis not present

## 2016-04-13 DIAGNOSIS — M9901 Segmental and somatic dysfunction of cervical region: Secondary | ICD-10-CM | POA: Diagnosis not present

## 2016-04-20 DIAGNOSIS — M9903 Segmental and somatic dysfunction of lumbar region: Secondary | ICD-10-CM | POA: Diagnosis not present

## 2016-04-20 DIAGNOSIS — M9901 Segmental and somatic dysfunction of cervical region: Secondary | ICD-10-CM | POA: Diagnosis not present

## 2016-04-20 DIAGNOSIS — M5033 Other cervical disc degeneration, cervicothoracic region: Secondary | ICD-10-CM | POA: Diagnosis not present

## 2016-04-20 DIAGNOSIS — M5412 Radiculopathy, cervical region: Secondary | ICD-10-CM | POA: Diagnosis not present

## 2016-05-04 ENCOUNTER — Telehealth: Payer: Self-pay | Admitting: Internal Medicine

## 2016-05-04 DIAGNOSIS — M9903 Segmental and somatic dysfunction of lumbar region: Secondary | ICD-10-CM | POA: Diagnosis not present

## 2016-05-04 DIAGNOSIS — M5033 Other cervical disc degeneration, cervicothoracic region: Secondary | ICD-10-CM | POA: Diagnosis not present

## 2016-05-04 DIAGNOSIS — M5412 Radiculopathy, cervical region: Secondary | ICD-10-CM | POA: Diagnosis not present

## 2016-05-04 DIAGNOSIS — M9901 Segmental and somatic dysfunction of cervical region: Secondary | ICD-10-CM | POA: Diagnosis not present

## 2016-05-04 NOTE — Telephone Encounter (Signed)
Pt called and stated that she received a bill from [facility] for DOS 03/23/16. [facility] is for an office visit and it should have been billed as a physical. Please advise, thank you!  Call pt @ [phone]

## 2016-05-09 NOTE — Telephone Encounter (Signed)
Thank you :)

## 2016-05-09 NOTE — Telephone Encounter (Signed)
The patient was seen for acute issue (sinus infection) with her physical . She can call to billing to discuss any errors.

## 2016-05-09 NOTE — Telephone Encounter (Signed)
Pt has been notified.

## 2016-05-16 DIAGNOSIS — H02422 Myogenic ptosis of left eyelid: Secondary | ICD-10-CM | POA: Diagnosis not present

## 2016-05-19 ENCOUNTER — Other Ambulatory Visit (INDEPENDENT_AMBULATORY_CARE_PROVIDER_SITE_OTHER): Payer: PPO

## 2016-05-19 DIAGNOSIS — E7800 Pure hypercholesterolemia, unspecified: Secondary | ICD-10-CM | POA: Diagnosis not present

## 2016-05-19 DIAGNOSIS — E039 Hypothyroidism, unspecified: Secondary | ICD-10-CM

## 2016-05-19 LAB — LIPID PANEL
Cholesterol: 216 mg/dL — ABNORMAL HIGH (ref 0–200)
HDL: 65.4 mg/dL
LDL Cholesterol: 116 mg/dL — ABNORMAL HIGH (ref 0–99)
NonHDL: 150.68
Total CHOL/HDL Ratio: 3
Triglycerides: 173 mg/dL — ABNORMAL HIGH (ref 0.0–149.0)
VLDL: 34.6 mg/dL (ref 0.0–40.0)

## 2016-05-19 LAB — BASIC METABOLIC PANEL WITH GFR
BUN: 17 mg/dL (ref 6–23)
CO2: 31 meq/L (ref 19–32)
Calcium: 10 mg/dL (ref 8.4–10.5)
Chloride: 103 meq/L (ref 96–112)
Creatinine, Ser: 0.66 mg/dL (ref 0.40–1.20)
GFR: 93.78 mL/min
Glucose, Bld: 81 mg/dL (ref 70–99)
Potassium: 4.8 meq/L (ref 3.5–5.1)
Sodium: 141 meq/L (ref 135–145)

## 2016-05-19 LAB — HEPATIC FUNCTION PANEL
ALT: 14 U/L (ref 0–35)
AST: 16 U/L (ref 0–37)
Albumin: 4.6 g/dL (ref 3.5–5.2)
Alkaline Phosphatase: 75 U/L (ref 39–117)
Bilirubin, Direct: 0.1 mg/dL (ref 0.0–0.3)
Total Bilirubin: 0.4 mg/dL (ref 0.2–1.2)
Total Protein: 7 g/dL (ref 6.0–8.3)

## 2016-05-19 LAB — TSH: TSH: 2.31 u[IU]/mL (ref 0.35–4.50)

## 2016-05-20 ENCOUNTER — Encounter: Payer: Self-pay | Admitting: Internal Medicine

## 2016-05-24 DIAGNOSIS — M5033 Other cervical disc degeneration, cervicothoracic region: Secondary | ICD-10-CM | POA: Diagnosis not present

## 2016-05-24 DIAGNOSIS — M9903 Segmental and somatic dysfunction of lumbar region: Secondary | ICD-10-CM | POA: Diagnosis not present

## 2016-05-24 DIAGNOSIS — M5412 Radiculopathy, cervical region: Secondary | ICD-10-CM | POA: Diagnosis not present

## 2016-05-24 DIAGNOSIS — M9901 Segmental and somatic dysfunction of cervical region: Secondary | ICD-10-CM | POA: Diagnosis not present

## 2016-05-26 ENCOUNTER — Ambulatory Visit: Payer: Medicare Other

## 2016-06-21 DIAGNOSIS — Z96652 Presence of left artificial knee joint: Secondary | ICD-10-CM | POA: Diagnosis not present

## 2016-06-21 DIAGNOSIS — M9901 Segmental and somatic dysfunction of cervical region: Secondary | ICD-10-CM | POA: Diagnosis not present

## 2016-06-21 DIAGNOSIS — M5033 Other cervical disc degeneration, cervicothoracic region: Secondary | ICD-10-CM | POA: Diagnosis not present

## 2016-06-21 DIAGNOSIS — M5412 Radiculopathy, cervical region: Secondary | ICD-10-CM | POA: Diagnosis not present

## 2016-06-21 DIAGNOSIS — M9903 Segmental and somatic dysfunction of lumbar region: Secondary | ICD-10-CM | POA: Diagnosis not present

## 2016-06-21 DIAGNOSIS — Z471 Aftercare following joint replacement surgery: Secondary | ICD-10-CM | POA: Diagnosis not present

## 2016-06-28 DIAGNOSIS — M5033 Other cervical disc degeneration, cervicothoracic region: Secondary | ICD-10-CM | POA: Diagnosis not present

## 2016-06-28 DIAGNOSIS — M9901 Segmental and somatic dysfunction of cervical region: Secondary | ICD-10-CM | POA: Diagnosis not present

## 2016-06-28 DIAGNOSIS — M9903 Segmental and somatic dysfunction of lumbar region: Secondary | ICD-10-CM | POA: Diagnosis not present

## 2016-06-28 DIAGNOSIS — M5412 Radiculopathy, cervical region: Secondary | ICD-10-CM | POA: Diagnosis not present

## 2016-07-05 DIAGNOSIS — M9903 Segmental and somatic dysfunction of lumbar region: Secondary | ICD-10-CM | POA: Diagnosis not present

## 2016-07-05 DIAGNOSIS — M5412 Radiculopathy, cervical region: Secondary | ICD-10-CM | POA: Diagnosis not present

## 2016-07-05 DIAGNOSIS — M9901 Segmental and somatic dysfunction of cervical region: Secondary | ICD-10-CM | POA: Diagnosis not present

## 2016-07-05 DIAGNOSIS — M5033 Other cervical disc degeneration, cervicothoracic region: Secondary | ICD-10-CM | POA: Diagnosis not present

## 2016-07-11 ENCOUNTER — Ambulatory Visit (INDEPENDENT_AMBULATORY_CARE_PROVIDER_SITE_OTHER): Payer: PPO | Admitting: Internal Medicine

## 2016-07-11 ENCOUNTER — Encounter: Payer: Self-pay | Admitting: Internal Medicine

## 2016-07-11 VITALS — BP 118/62 | HR 62 | Temp 98.6°F | Resp 16 | Ht 63.0 in | Wt 147.0 lb

## 2016-07-11 DIAGNOSIS — E039 Hypothyroidism, unspecified: Secondary | ICD-10-CM

## 2016-07-11 DIAGNOSIS — M25562 Pain in left knee: Secondary | ICD-10-CM

## 2016-07-11 DIAGNOSIS — F439 Reaction to severe stress, unspecified: Secondary | ICD-10-CM | POA: Diagnosis not present

## 2016-07-11 DIAGNOSIS — E7800 Pure hypercholesterolemia, unspecified: Secondary | ICD-10-CM | POA: Diagnosis not present

## 2016-07-11 DIAGNOSIS — K2100 Gastro-esophageal reflux disease with esophagitis, without bleeding: Secondary | ICD-10-CM

## 2016-07-11 MED ORDER — ROSUVASTATIN CALCIUM 5 MG PO TABS: 5.0000 mg | ORAL_TABLET | Freq: Every day | ORAL | 1 refills | Status: DC

## 2016-07-11 MED ORDER — LEVOTHYROXINE SODIUM 75 MCG PO TABS: 75.0000 ug | ORAL_TABLET | ORAL | 1 refills | Status: DC

## 2016-07-11 MED ORDER — LEVOTHYROXINE SODIUM 50 MCG PO TABS: 50.0000 ug | ORAL_TABLET | ORAL | 1 refills | Status: DC

## 2016-07-11 MED ORDER — OMEPRAZOLE 20 MG PO CPDR: 20.0000 mg | DELAYED_RELEASE_CAPSULE | Freq: Every day | ORAL | 1 refills | Status: DC

## 2016-07-11 NOTE — Progress Notes (Signed)
Pre-visit discussion using our clinic review tool. No additional management support is needed unless otherwise documented below in the visit note.  

## 2016-07-11 NOTE — Progress Notes (Signed)
Patient ID: [name], female   DOB: 13-Oct-1945, 71 y.o.   MRN: [id number]   Subjective:    Patient ID: [name], female    DOB: [birth year], 71 y.o.   MRN: [id number]  HPI  Patient here for a scheduled follow up.  She is doing well.  Just got remarried.  Is happy.  Decreased stress now.  No chest pain.  No sob.  No acid reflux.  No abdominal pain or cramping.  Bowels stable.  Saw ortho regarding her knee.  Xray.  Has Baker's cyst.  Icing bid.  Overall she feels she is doing well.    Past Medical History:  Diagnosis Date  . Arthritis   . Environmental allergies   . Esophagitis    [place], followed by Dr Vennie Homans  . GERD (gastroesophageal reflux disease)   . Hypercholesterolemia   . Hypothyroidism    Past Surgical History:  Procedure Laterality Date  . [place]  . KNEE ARTHROSCOPY  12/03/07   left  . KNEE ARTHROSCOPY  06/25/08   left  . NASAL SINUS SURGERY  12/06  . REPLACEMENT TOTAL KNEE  01/04/09   left  . RHINOPLASTY  1986  . VAGINAL HYSTERECTOMY  1980   ovaries left in place  . WRIST SURGERY  1970   cyst removal   Family History  Problem Relation Age of Onset  . Heart disease Father   . Hyperlipidemia Paternal Grandmother   . Hyperlipidemia Paternal Grandfather   . Prostate cancer Maternal Uncle   . Pancreatic cancer Cousin   . Multiple myeloma Brother   . Breast cancer Maternal Aunt   . Breast cancer Maternal Aunt    Social History   Social History  . Marital status: Widowed    Spouse name: N/A  . Number of children: N/A  . Years of education: N/A   Social History Main Topics  . Smoking status: Former Smoker    Years: 5.00    Types: Cigarettes  . Smokeless tobacco: Never Used  . Alcohol use No  . Drug use: No  . Sexual activity: Not Currently   Other Topics Concern  . None   Social History Narrative  . None    Outpatient Encounter Prescriptions as of 07/11/2016  Medication Sig  . acetaminophen (TYLENOL) 500 MG tablet Take 500  mg by mouth every 6 (six) hours as needed.  Marland Kitchen BIOTIN 5000 PO Take 1 tablet by mouth daily.  . busPIRone (BUSPAR) 5 MG tablet Take 1 tablet (5 mg total) by mouth 2 (two) times daily.  . Cholecalciferol (VITAMIN D PO) Take 5,000 Units by mouth daily.  . Coenzyme Q10 60 MG TABS Take 120 mg by mouth daily.  . Eluxadoline (VIBERZI) 75 MG TABS Take 75 mg by mouth daily. Reported on 11/17/2015  . esomeprazole (NEXIUM) 40 MG capsule Take 40 mg by mouth daily as needed.   . Hypertonic Nasal Wash (SINUS RINSE NA) Place into the nose as needed.  . Lactobacillus (PROBIOTIC ACIDOPHILUS PO) Take 1,360 mg by mouth daily.  Marland Kitchen levothyroxine (LEVOTHROID) 75 MCG tablet Take 1 tablet (75 mcg total) by mouth every other day.  . levothyroxine (SYNTHROID, LEVOTHROID) 50 MCG tablet Take 1 tablet (50 mcg total) by mouth every other day.  . Multiple Vitamin (MULTIVITAMIN) LIQD Take 5 mLs by mouth daily.  . mupirocin ointment (BACTROBAN) 2 % Place 1 application into the nose 2 (two) times daily.  Marland Kitchen nystatin cream (MYCOSTATIN) Apply 1 application  topically 2 (two) times daily.  . Omega-3 Fatty Acids (FISH OIL) 1360 MG CAPS Take by mouth daily.  Marland Kitchen omeprazole (PRILOSEC) 20 MG capsule Take 1 capsule (20 mg total) by mouth daily.  . Probiotic Product (PROBIOTIC DAILY PO) Take 1 tablet by mouth.  . rosuvastatin (CRESTOR) 5 MG tablet Take 1 tablet (5 mg total) by mouth daily.  Marland Kitchen triamcinolone (KENALOG) 0.025 % ointment Apply 1 application topically 2 (two) times daily.  . vitamin E 400 UNIT capsule Take 400 Units by mouth daily.  . [DISCONTINUED] levothyroxine (LEVOTHROID) 75 MCG tablet Take 1 tablet (75 mcg total) by mouth every other day.  . [DISCONTINUED] levothyroxine (SYNTHROID, LEVOTHROID) 50 MCG tablet Take 1 tablet (50 mcg total) by mouth every other day.  . [DISCONTINUED] rosuvastatin (CRESTOR) 5 MG tablet Take 1 tablet (5 mg total) by mouth daily.  Marland Kitchen omeprazole (PRILOSEC) 20 MG capsule Take 1 capsule (20 mg total) by  mouth daily.   No facility-administered encounter medications on file as of 07/11/2016.     Review of Systems  Constitutional: Negative for appetite change and unexpected weight change.  HENT: Negative for congestion and sinus pressure.   Respiratory: Negative for cough, chest tightness and shortness of breath.   Cardiovascular: Negative for chest pain, palpitations and leg swelling.  Gastrointestinal: Negative for abdominal pain, diarrhea, nausea and vomiting.  Genitourinary: Negative for difficulty urinating and dysuria.  Musculoskeletal:       Left knee pain as outlined.    Skin: Negative for color change and rash.  Neurological: Negative for dizziness, light-headedness and headaches.  Psychiatric/Behavioral: Negative for agitation and dysphoric mood.       Objective:    Physical Exam  Constitutional: She appears well-developed and well-nourished. No distress.  HENT:  Nose: Nose normal.  Mouth/Throat: Oropharynx is clear and moist.  Neck: Neck supple. No thyromegaly present.  Cardiovascular: Normal rate and regular rhythm.   Pulmonary/Chest: Breath sounds normal. No respiratory distress. She has no wheezes.  Abdominal: Soft. Bowel sounds are normal. There is no tenderness.  Musculoskeletal: She exhibits no edema or tenderness.  Lymphadenopathy:    She has no cervical adenopathy.  Skin: No rash noted. No erythema.  Psychiatric: She has a normal mood and affect. Her behavior is normal.    BP 118/62 (BP Location: Left Arm, Patient Position: Sitting, Cuff Size: Large)   Pulse 62   Temp 98.6 F (37 C) (Oral)   Resp 16   Ht _0  (1.6 m)   Wt 147 lb (66.7 kg)   SpO2 96%   BMI 26.04 kg/m  Wt Readings from Last 3 Encounters:  07/11/16 147 lb (66.7 kg)  03/23/16 142 lb 9.6 oz (64.7 kg)  03/01/16 139 lb 3.2 oz (63.1 kg)     Lab Results  Component Value Date   WBC 5.3 12/23/2015   HGB 13.2 12/23/2015   HCT 39.4 12/23/2015   PLT 280.0 12/23/2015   GLUCOSE 81  05/19/2016   CHOL 216 (H) 05/19/2016   TRIG 173.0 (H) 05/19/2016   HDL 65.40 05/19/2016   LDLDIRECT 123.4 02/27/2013   LDLCALC 116 (H) 05/19/2016   ALT 14 05/19/2016   AST 16 05/19/2016   NA 141 05/19/2016   K 4.8 05/19/2016   CL 103 05/19/2016   CREATININE 0.66 05/19/2016   BUN 17 05/19/2016   CO2 31 05/19/2016   TSH 2.31 05/19/2016    Mm Screening Breast Tomo Bilateral  Result Date: 02/08/2016 CLINICAL DATA:  Screening. EXAM: 2D  DIGITAL SCREENING BILATERAL MAMMOGRAM WITH CAD AND ADJUNCT TOMO COMPARISON:  Previous exam(s). ACR Breast Density Category c: The breast tissue is heterogeneously dense, which may obscure small masses. FINDINGS: There are no findings suspicious for malignancy. Images were processed with CAD. IMPRESSION: No mammographic evidence of malignancy. A result letter of this screening mammogram will be mailed directly to the patient. RECOMMENDATION: Screening mammogram in one year. (Code:SM-B-01Y) BI-RADS CATEGORY  1: Negative. Electronically Signed   By: Lovey Newcomer M.D.   On: 02/08/2016 13:25       Assessment & Plan:   Problem List Items Addressed This Visit    GERD (gastroesophageal reflux disease)    Has been controlled on nexium.  Follow.       Relevant Medications   omeprazole (PRILOSEC) 20 MG capsule   Hypercholesteremia    Low cholesterol diet and exercise.  On crestor.  Follow lipid panel and liver function tests.        Relevant Medications   rosuvastatin (CRESTOR) 5 MG tablet   Other Relevant Orders   Lipid panel   Hepatic function panel   Basic metabolic panel   Hypothyroidism    On thyroid replacement.  Follow tsh.        Relevant Medications   levothyroxine (SYNTHROID, LEVOTHROID) 50 MCG tablet   levothyroxine (LEVOTHROID) 75 MCG tablet   Other Relevant Orders   TSH   Stress    Doing better.  Recently remarried.  Follow.        Other Visit Diagnoses    Left knee pain, unspecified chronicity    -  Primary   seeig ortho.  Bakers  cyst.         Einar Pheasant, MD

## 2016-07-12 DIAGNOSIS — M9903 Segmental and somatic dysfunction of lumbar region: Secondary | ICD-10-CM | POA: Diagnosis not present

## 2016-07-12 DIAGNOSIS — M5412 Radiculopathy, cervical region: Secondary | ICD-10-CM | POA: Diagnosis not present

## 2016-07-12 DIAGNOSIS — M9901 Segmental and somatic dysfunction of cervical region: Secondary | ICD-10-CM | POA: Diagnosis not present

## 2016-07-12 DIAGNOSIS — M5033 Other cervical disc degeneration, cervicothoracic region: Secondary | ICD-10-CM | POA: Diagnosis not present

## 2016-07-13 DIAGNOSIS — Z96652 Presence of left artificial knee joint: Secondary | ICD-10-CM | POA: Diagnosis not present

## 2016-07-17 ENCOUNTER — Encounter: Payer: Self-pay | Admitting: Internal Medicine

## 2016-07-17 NOTE — Assessment & Plan Note (Signed)
Doing better.  Recently remarried.  Follow.

## 2016-07-17 NOTE — Assessment & Plan Note (Signed)
On thyroid replacement.  Follow tsh.  

## 2016-07-17 NOTE — Assessment & Plan Note (Signed)
Has been controlled on nexium.  Follow.

## 2016-07-17 NOTE — Assessment & Plan Note (Signed)
Low cholesterol diet and exercise.  On crestor.  Follow lipid panel and liver function tests.  

## 2016-08-02 ENCOUNTER — Ambulatory Visit (INDEPENDENT_AMBULATORY_CARE_PROVIDER_SITE_OTHER): Payer: PPO

## 2016-08-02 VITALS — BP 118/70 | HR 69 | Temp 97.8°F | Resp 12 | Ht 63.0 in | Wt 149.0 lb

## 2016-08-02 DIAGNOSIS — M5412 Radiculopathy, cervical region: Secondary | ICD-10-CM | POA: Diagnosis not present

## 2016-08-02 DIAGNOSIS — Z0000 Encounter for general adult medical examination without abnormal findings: Secondary | ICD-10-CM | POA: Diagnosis not present

## 2016-08-02 DIAGNOSIS — M9901 Segmental and somatic dysfunction of cervical region: Secondary | ICD-10-CM | POA: Diagnosis not present

## 2016-08-02 DIAGNOSIS — M9903 Segmental and somatic dysfunction of lumbar region: Secondary | ICD-10-CM | POA: Diagnosis not present

## 2016-08-02 DIAGNOSIS — M5033 Other cervical disc degeneration, cervicothoracic region: Secondary | ICD-10-CM | POA: Diagnosis not present

## 2016-08-02 NOTE — Patient Instructions (Addendum)
  [name] , Thank you for taking time to come for your Medicare Wellness Visit. I appreciate your ongoing commitment to your health goals. Please review the following plan we discussed and let me know if I can assist you in the future.   These are the goals we discussed: Goals    . Increase physical activity           Stretch  Yoga classes 2 times a week         This is a list of the screening recommended for you and due dates:  Health Maintenance  Topic Date Due  . Mammogram  02/07/2017  . Colon Cancer Screening  05/07/2023  . Tetanus Vaccine  11/16/2025  . Flu Shot  Completed  . DEXA scan (bone density measurement)  Completed  .  Hepatitis C: One time screening is recommended by Center for Disease Control  (CDC) for  adults born from [birth year] through 1966.   Completed  . Pneumonia vaccines  Completed

## 2016-08-02 NOTE — Progress Notes (Signed)
Subjective:   [name] is a 71 y.o. female who presents for Medicare Annual (Subsequent) preventive examination.  Review of Systems:  No ROS.  Medicare Wellness Visit.  Cardiac Risk Factors include: advanced age (>33mn, >>92women)     Objective:     Vitals: BP 118/70 (BP Location: Left Arm, Patient Position: Sitting, Cuff Size: Normal)   Pulse 69   Temp 97.8 F (36.6 C) (Oral)   Resp 12   Ht 5' 3"  (1.6 m)   Wt 149 lb (67.6 kg)   SpO2 96%   BMI 26.39 kg/m   Body mass index is 26.39 kg/m.   Tobacco History  Smoking Status  . Former Smoker  . Years: 5.00  . Types: Cigarettes  Smokeless Tobacco  . Never Used     Counseling given: Not Answered   Past Medical History:  Diagnosis Date  . Arthritis   . Environmental allergies   . Esophagitis    [place], followed by Dr NVennie Homans . GERD (gastroesophageal reflux disease)   . Hypercholesterolemia   . Hypothyroidism    Past Surgical History:  [place]  12/03/07   left  . KNEE ARTHROSCOPY  06/25/08   left  . NASAL SINUS SURGERY  12/06  . REPLACEMENT TOTAL KNEE  01/04/09   left  . RHINOPLASTY  1986  . VAGINAL HYSTERECTOMY  1980   ovaries left in place  . WRIST SURGERY  1970   cyst removal   Family History  Problem Relation Age of Onset  . Heart disease Father   . Hyperlipidemia Paternal Grandmother   . Hyperlipidemia Paternal Grandfather   . Prostate cancer Maternal Uncle   . Pancreatic cancer Cousin   . Multiple myeloma Brother   . Breast cancer Maternal Aunt   . Breast cancer Maternal Aunt    History  Sexual Activity  . Sexual activity: Yes    Outpatient Encounter Prescriptions as of 08/02/2016  Medication Sig  . acetaminophen (TYLENOL) 500 MG tablet Take 500 mg by mouth every 6 (six) hours as needed.  .Marland KitchenBIOTIN 5000 PO Take 1 tablet by mouth daily.  . busPIRone (BUSPAR) 5 MG tablet Take 1 tablet (5 mg total) by mouth 2 (two) times  daily.  . Cholecalciferol (VITAMIN D PO) Take 5,000 Units by mouth daily.  . Coenzyme Q10 60 MG TABS Take 120 mg by mouth daily.  . diclofenac sodium (VOLTAREN) 1 % GEL Apply 1 application topically as needed.  .Marland Kitchenesomeprazole (NEXIUM) 40 MG capsule Take 40 mg by mouth daily as needed.   . Hypertonic Nasal Wash (SINUS RINSE NA) Place into the nose as needed.  .Marland Kitchenlevothyroxine (LEVOTHROID) 75 MCG tablet Take 1 tablet (75 mcg total) by mouth every other day.  . levothyroxine (SYNTHROID, LEVOTHROID) 50 MCG tablet Take 1 tablet (50 mcg total) by mouth every other day.  . Methylsulfonylmethane (MSM) POWD Take 4 g by mouth daily.  . Multiple Vitamin (MULTIVITAMIN) LIQD Take 5 mLs by mouth daily.  . mupirocin ointment (BACTROBAN) 2 % Place 1 application into the nose 2 (two) times daily.  .Marland Kitchennystatin cream (MYCOSTATIN) Apply 1 application topically 2 (two) times daily.  . Omega-3 Fatty Acids (FISH OIL) 1360 MG CAPS Take by mouth daily.  .Marland Kitchenomeprazole (PRILOSEC) 20 MG capsule Take 1 capsule (20 mg total) by mouth daily.  . Probiotic Product (PROBIOTIC DAILY PO) Take 1 capsule by mouth.  .Marland Kitchen  rosuvastatin (CRESTOR) 5 MG tablet Take 1 tablet (5 mg total) by mouth daily.  Marland Kitchen triamcinolone (KENALOG) 0.025 % ointment Apply 1 application topically 2 (two) times daily.  . vitamin E 400 UNIT capsule Take 400 Units by mouth daily.  . [DISCONTINUED] Eluxadoline (VIBERZI) 75 MG TABS Take 75 mg by mouth daily. Reported on 11/17/2015  . [DISCONTINUED] Lactobacillus (PROBIOTIC ACIDOPHILUS PO) Take 1,360 mg by mouth daily.  . [DISCONTINUED] omeprazole (PRILOSEC) 20 MG capsule Take 1 capsule (20 mg total) by mouth daily.  . [DISCONTINUED] Probiotic Product (PROBIOTIC DAILY PO) Take 1 tablet by mouth.   No facility-administered encounter medications on file as of 08/02/2016.     Activities of Daily Living In your present state of health, do you have any difficulty performing the following activities: 08/02/2016  Hearing? N    Vision? N  Difficulty concentrating or making decisions? N  Walking or climbing stairs? Y  Dressing or bathing? N  Doing errands, shopping? N  Preparing Food and eating ? N  Using the Toilet? N  In the past six months, have you accidently leaked urine? N  Do you have problems with loss of bowel control? N  Managing your Medications? N  Managing your Finances? N  Housekeeping or managing your Housekeeping? N  Some recent data might be hidden    Patient Care Team: Einar Pheasant, MD as PCP - General (Internal Medicine)    Assessment:    This is a routine wellness examination for [name]. The goal of the wellness visit is to assist the patient how to close the gaps in care and create a preventative care plan for the patient.   Taking calcium VIT D as appropriate/Osteoporosis risk reviewed.  Medications reviewed; taking without issues or barriers.  Safety issues reviewed; smoke detectors in the home. No firearms in the home. Wears seatbelts when driving or riding with others. No violence in the home.  No identified risk were noted; The patient was oriented x 3; appropriate in dress and manner and no objective failures at ADL's or IADL's.   BMI; discussed the importance of a healthy diet, water intake and exercise. Educational material provided.  Health maintenance gaps; closed.  Patient Concerns: None at this time. Follow up with PCP as needed.  Exercise Activities and Dietary recommendations Current Exercise Habits: Home exercise routine (Yoga), Type of exercise: walking, Frequency (Times/Week): 5, Intensity: Mild  Goals    . Increase physical activity           Stretch  Yoga classes 2 times a week        Fall Risk Fall Risk  08/02/2016 03/23/2016 05/27/2015 08/14/2012 08/13/2012  Falls in the past year? No No No No No   Depression Screen PHQ 2/9 Scores 08/02/2016 03/23/2016 05/27/2015 08/14/2012  PHQ - 2 Score 0 0 1 0     Cognitive Function MMSE - Mini Mental  State Exam 08/02/2016 05/27/2015  Orientation to time 5 5  Orientation to Place 5 5  Registration 3 3  Attention/ Calculation 5 5  Recall 3 3  Language- name 2 objects 2 2  Language- repeat 1 1  Language- follow 3 step command 3 3  Language- read & follow direction 1 1  Write a sentence 1 1  Copy design 1 1  Total score 30 30        Immunization History  Administered Date(s) Administered  . Influenza Split 03/26/2012  . Influenza,inj,Quad PF,36+ Mos 02/25/2013, 02/14/2014, 03/26/2015, 02/02/2016  . Pneumococcal  Conjugate-13 04/22/2015  . Pneumococcal Polysaccharide-23 01/24/2011  . Tdap 11/17/2015   Screening Tests Health Maintenance  Topic Date Due  . MAMMOGRAM  02/07/2017  . COLONOSCOPY  05/07/2023  . TETANUS/TDAP  11/16/2025  . INFLUENZA VACCINE  Completed  . DEXA SCAN  Completed  . Hepatitis C Screening  Completed  . PNA vac Low Risk Adult  Completed      Plan:    End of life planning; Advance aging; Advanced directives discussed. Copy of current HCPOA/Living Will requested.  Medicare Attestation I have personally reviewed: The patient's medical and social history Their use of alcohol, tobacco or illicit drugs Their current medications and supplements The patient's functional ability including ADLs,fall risks, home safety risks, cognitive, and hearing and visual impairment Diet and physical activities Evidence for depression   The patient's weight, height, BMI, and visual acuity have been recorded in the chart.  I have made referrals and provided education to the patient based on review of the above and I have provided the patient with a written personalized care plan for preventive services.    During the course of the visit the patient was educated and counseled about the following appropriate screening and preventive services:   Vaccines to include Pneumoccal, Influenza, Hepatitis B, Td, Zostavax, HCV  Electrocardiogram  Cardiovascular  Disease  Colorectal cancer screening  Bone density screening  Diabetes screening  Glaucoma screening  Mammography/PAP  Nutrition counseling   Patient Instructions (the written plan) was given to the patient.   Varney Biles, LPN  0/27/7412   Reviewed above information.  Agree with plan.  Dr Nicki Reaper

## 2016-08-05 ENCOUNTER — Ambulatory Visit (HOSPITAL_COMMUNITY)
Admission: RE | Admit: 2016-08-05 | Discharge: 2016-08-05 | Disposition: A | Discharge status: Home / Self Care | Payer: PPO | Source: Ambulatory Visit | Attending: Cardiology | Admitting: Cardiology

## 2016-08-05 ENCOUNTER — Other Ambulatory Visit (HOSPITAL_COMMUNITY): Payer: Self-pay | Admitting: Specialist

## 2016-08-05 DIAGNOSIS — Z96652 Presence of left artificial knee joint: Secondary | ICD-10-CM | POA: Diagnosis present

## 2016-08-05 DIAGNOSIS — Z87891 Personal history of nicotine dependence: Secondary | ICD-10-CM | POA: Insufficient documentation

## 2016-08-05 DIAGNOSIS — Z471 Aftercare following joint replacement surgery: Secondary | ICD-10-CM | POA: Diagnosis not present

## 2016-08-05 DIAGNOSIS — M79662 Pain in left lower leg: Secondary | ICD-10-CM | POA: Insufficient documentation

## 2016-08-05 DIAGNOSIS — M7989 Other specified soft tissue disorders: Secondary | ICD-10-CM | POA: Diagnosis present

## 2016-08-05 DIAGNOSIS — E785 Hyperlipidemia, unspecified: Secondary | ICD-10-CM | POA: Insufficient documentation

## 2016-08-05 DIAGNOSIS — M79605 Pain in left leg: Secondary | ICD-10-CM

## 2016-08-09 DIAGNOSIS — L565 Disseminated superficial actinic porokeratosis (DSAP): Secondary | ICD-10-CM | POA: Diagnosis not present

## 2016-08-09 DIAGNOSIS — D485 Neoplasm of uncertain behavior of skin: Secondary | ICD-10-CM | POA: Diagnosis not present

## 2016-08-09 DIAGNOSIS — Z872 Personal history of diseases of the skin and subcutaneous tissue: Secondary | ICD-10-CM | POA: Diagnosis not present

## 2016-08-09 DIAGNOSIS — L821 Other seborrheic keratosis: Secondary | ICD-10-CM | POA: Diagnosis not present

## 2016-08-09 DIAGNOSIS — Z86018 Personal history of other benign neoplasm: Secondary | ICD-10-CM | POA: Diagnosis not present

## 2016-08-09 DIAGNOSIS — D1800 Hemangioma unspecified site: Secondary | ICD-10-CM | POA: Diagnosis not present

## 2016-08-15 ENCOUNTER — Telehealth: Payer: Self-pay | Admitting: Internal Medicine

## 2016-08-15 NOTE — Telephone Encounter (Signed)
See if she can come in tomorrow at 12:15.  Thanks

## 2016-08-15 NOTE — Telephone Encounter (Signed)
Called pt she is having burning and frequency x 3 days. No fever no N&V. Burning is only at end of urination. She had sterile urine cup that she has sample in. Wanted to know if she can drop off to get tested. She also wanted to know if she can get refill on mobic.

## 2016-08-15 NOTE — Telephone Encounter (Signed)
Pt called back to follow up. Pt was out in the area. Thank you!

## 2016-08-15 NOTE — Telephone Encounter (Signed)
Pt called and stated that she believes that she has a uti. She is c/o burning when urinating. Pt is leaving the country next week and wants to make sure everything is ok. She has a urine cup that she has filled. Can she drop off a urine sample. Please advise, thank you!  Call pt @ [phone]

## 2016-08-15 NOTE — Telephone Encounter (Signed)
Spoke to patient has been given to vanessa to add pt informed

## 2016-08-16 ENCOUNTER — Encounter: Payer: Self-pay | Admitting: Internal Medicine

## 2016-08-16 ENCOUNTER — Ambulatory Visit (INDEPENDENT_AMBULATORY_CARE_PROVIDER_SITE_OTHER): Payer: PPO | Admitting: Internal Medicine

## 2016-08-16 VITALS — BP 108/76 | HR 60 | Temp 97.9°F | Resp 14 | Wt 146.4 lb

## 2016-08-16 DIAGNOSIS — R300 Dysuria: Secondary | ICD-10-CM

## 2016-08-16 LAB — URINALYSIS, MICROSCOPIC ONLY

## 2016-08-16 LAB — POCT URINALYSIS DIP (MANUAL ENTRY)
Bilirubin, UA: NEGATIVE
Glucose, UA: NEGATIVE
Ketones, POC UA: NEGATIVE
Nitrite, UA: POSITIVE — AB
Protein Ur, POC: 30 — AB
Spec Grav, UA: 1.015
Urobilinogen, UA: 0.2
pH, UA: 5

## 2016-08-16 MED ORDER — MELOXICAM 7.5 MG PO TABS: 7.5000 mg | ORAL_TABLET | Freq: Every day | ORAL | 0 refills | Status: DC

## 2016-08-16 MED ORDER — CIPROFLOXACIN HCL 500 MG PO TABS: 500.0000 mg | ORAL_TABLET | Freq: Two times a day (BID) | ORAL | 0 refills | Status: DC

## 2016-08-16 NOTE — Progress Notes (Signed)
Patient ID: [name], female   DOB: [birth year], 71 y.o.   MRN: [id number]   Subjective:    Patient ID: [name], female    DOB: 17-Jan-1946, 71 y.o.   MRN: [id number]  HPI  Patient here as a work in with concerns regarding a possible uti.  Reports started noticing dysuria over the last 2-3 days.  Some nocturia.  No abdominal pain.  No hematuria. No vaginal symptoms.  No nausea or vomiting.  Bowels moving.  No fever.  Eating and drinking well.    Past Medical History:  Diagnosis Date  . Arthritis   . Environmental allergies   . Esophagitis    [place], followed by Dr Vennie Homans  . GERD (gastroesophageal reflux disease)   . Hypercholesterolemia   . Hypothyroidism    Past Surgical History:  Procedure Laterality Date  . [place]  . KNEE ARTHROSCOPY  12/03/07   left  . KNEE ARTHROSCOPY  06/25/08   left  . NASAL SINUS SURGERY  12/06  . REPLACEMENT TOTAL KNEE  01/04/09   left  . RHINOPLASTY  1986  . VAGINAL HYSTERECTOMY  1980   ovaries left in place  . WRIST SURGERY  1970   cyst removal   Family History  Problem Relation Age of Onset  . Heart disease Father   . Hyperlipidemia Paternal Grandmother   . Hyperlipidemia Paternal Grandfather   . Prostate cancer Maternal Uncle   . Pancreatic cancer Cousin   . Multiple myeloma Brother   . Breast cancer Maternal Aunt   . Breast cancer Maternal Aunt    Social History   Social History  . Marital status: Widowed    Spouse name: N/A  . Number of children: N/A  . Years of education: N/A   Social History Main Topics  . Smoking status: Former Smoker    Years: 5.00    Types: Cigarettes  . Smokeless tobacco: Never Used  . Alcohol use No  . Drug use: No  . Sexual activity: Yes   Other Topics Concern  . None   Social History Narrative  . None    Outpatient Encounter Prescriptions as of 08/16/2016  Medication Sig  . acetaminophen (TYLENOL) 500 MG tablet Take 500 mg by mouth every 6 (six) hours as needed.  .  Ascorbic Acid (VITAMIN C) 1000 MG tablet Take 1,000 mg by mouth daily.  Marland Kitchen BIOTIN 5000 PO Take 1 tablet by mouth daily.  . busPIRone (BUSPAR) 5 MG tablet Take 1 tablet (5 mg total) by mouth 2 (two) times daily.  . Cholecalciferol (VITAMIN D PO) Take 5,000 Units by mouth daily.  . Coenzyme Q10 60 MG TABS Take 120 mg by mouth daily.  . diclofenac sodium (VOLTAREN) 1 % GEL Apply 1 application topically as needed.  Marland Kitchen esomeprazole (NEXIUM) 40 MG capsule Take 40 mg by mouth daily as needed.   . Hypertonic Nasal Wash (SINUS RINSE NA) Place into the nose as needed.  Marland Kitchen levothyroxine (LEVOTHROID) 75 MCG tablet Take 1 tablet (75 mcg total) by mouth every other day.  . levothyroxine (SYNTHROID, LEVOTHROID) 50 MCG tablet Take 1 tablet (50 mcg total) by mouth every other day.  . Multiple Vitamin (MULTIVITAMIN) LIQD Take 5 mLs by mouth daily.  . mupirocin ointment (BACTROBAN) 2 % Place 1 application into the nose 2 (two) times daily.  . Omega-3 Fatty Acids (FISH OIL) 1360 MG CAPS Take by mouth daily.  Marland Kitchen omeprazole (PRILOSEC) 20 MG capsule Take 1  capsule (20 mg total) by mouth daily.  . Probiotic Product (PROBIOTIC DAILY PO) Take 1 capsule by mouth.  . rosuvastatin (CRESTOR) 5 MG tablet Take 1 tablet (5 mg total) by mouth daily.  Marland Kitchen triamcinolone (KENALOG) 0.025 % ointment Apply 1 application topically 2 (two) times daily.  . vitamin E 400 UNIT capsule Take 400 Units by mouth daily.  . ciprofloxacin (CIPRO) 500 MG tablet Take 1 tablet (500 mg total) by mouth 2 (two) times daily.  . meloxicam (MOBIC) 7.5 MG tablet Take 1 tablet (7.5 mg total) by mouth daily.  . Methylsulfonylmethane (MSM) POWD Take 4 g by mouth daily.  Marland Kitchen nystatin cream (MYCOSTATIN) Apply 1 application topically 2 (two) times daily. (Patient not taking: Reported on 08/16/2016)  . TOLAK 4 % CREA Apply daily from knees down   No facility-administered encounter medications on file as of 08/16/2016.     Review of Systems  Constitutional: Negative  for appetite change and fever.  Gastrointestinal: Negative for abdominal pain, diarrhea, nausea and vomiting.  Genitourinary: Positive for dysuria. Negative for hematuria.       Nocturia.  No vaginal symptoms.   Musculoskeletal:       No back pain.   Skin: Negative for rash.       Objective:    Physical Exam  Constitutional: She appears well-developed and well-nourished. No distress.  Cardiovascular: Normal rate and regular rhythm.   Pulmonary/Chest: Breath sounds normal. No respiratory distress. She has no wheezes.  Abdominal: Soft. Bowel sounds are normal. There is no tenderness.  Musculoskeletal:  No CVA tenderness.     BP 108/76 (BP Location: Left Arm, Patient Position: Sitting, Cuff Size: Normal)   Pulse 60   Temp 97.9 F (36.6 C) (Oral)   Resp 14   Wt 146 lb 6 oz (66.4 kg)   SpO2 98%   BMI 25.93 kg/m  Wt Readings from Last 3 Encounters:  08/16/16 146 lb 6 oz (66.4 kg)  08/02/16 149 lb (67.6 kg)  07/11/16 147 lb (66.7 kg)     Lab Results  Component Value Date   WBC 5.3 12/23/2015   HGB 13.2 12/23/2015   HCT 39.4 12/23/2015   PLT 280.0 12/23/2015   GLUCOSE 81 05/19/2016   CHOL 216 (H) 05/19/2016   TRIG 173.0 (H) 05/19/2016   HDL 65.40 05/19/2016   LDLDIRECT 123.4 02/27/2013   LDLCALC 116 (H) 05/19/2016   ALT 14 05/19/2016   AST 16 05/19/2016   NA 141 05/19/2016   K 4.8 05/19/2016   CL 103 05/19/2016   CREATININE 0.66 05/19/2016   BUN 17 05/19/2016   CO2 31 05/19/2016   TSH 2.31 05/19/2016       Assessment & Plan:   Problem List Items Addressed This Visit    None    Visit Diagnoses    Dysuria    -  Primary   symptoms as outlined.  urine dip appears to be c/w uti.  treat with cipro.  probiotic as directed.  urine send for culture.  no vaginal symptoms.    Relevant Orders   POCT urinalysis dipstick (Completed)   Urine Culture (Completed)   Urine Microscopic Only (Completed)       Einar Pheasant, MD

## 2016-08-16 NOTE — Progress Notes (Signed)
Pre visit review using our clinic review tool, if applicable. No additional management support is needed unless otherwise documented below in the visit note. 

## 2016-08-17 DIAGNOSIS — M9903 Segmental and somatic dysfunction of lumbar region: Secondary | ICD-10-CM | POA: Diagnosis not present

## 2016-08-17 DIAGNOSIS — M9901 Segmental and somatic dysfunction of cervical region: Secondary | ICD-10-CM | POA: Diagnosis not present

## 2016-08-17 DIAGNOSIS — M5033 Other cervical disc degeneration, cervicothoracic region: Secondary | ICD-10-CM | POA: Diagnosis not present

## 2016-08-17 DIAGNOSIS — M5412 Radiculopathy, cervical region: Secondary | ICD-10-CM | POA: Diagnosis not present

## 2016-08-19 LAB — URINE CULTURE

## 2016-08-21 ENCOUNTER — Encounter: Payer: Self-pay | Admitting: Internal Medicine

## 2016-09-06 ENCOUNTER — Other Ambulatory Visit (INDEPENDENT_AMBULATORY_CARE_PROVIDER_SITE_OTHER): Payer: PPO

## 2016-09-06 ENCOUNTER — Telehealth: Payer: Self-pay | Admitting: Internal Medicine

## 2016-09-06 DIAGNOSIS — E039 Hypothyroidism, unspecified: Secondary | ICD-10-CM

## 2016-09-06 DIAGNOSIS — E7800 Pure hypercholesterolemia, unspecified: Secondary | ICD-10-CM

## 2016-09-06 LAB — TSH: TSH: 0.36 u[IU]/mL (ref 0.35–4.50)

## 2016-09-06 LAB — LIPID PANEL
Cholesterol: 199 mg/dL (ref 0–200)
HDL: 63.5 mg/dL
LDL Cholesterol: 103 mg/dL — ABNORMAL HIGH (ref 0–99)
NonHDL: 135.87
Total CHOL/HDL Ratio: 3
Triglycerides: 164 mg/dL — ABNORMAL HIGH (ref 0.0–149.0)
VLDL: 32.8 mg/dL (ref 0.0–40.0)

## 2016-09-06 LAB — BASIC METABOLIC PANEL WITH GFR
BUN: 12 mg/dL (ref 6–23)
CO2: 30 meq/L (ref 19–32)
Calcium: 9.9 mg/dL (ref 8.4–10.5)
Chloride: 104 meq/L (ref 96–112)
Creatinine, Ser: 0.62 mg/dL (ref 0.40–1.20)
GFR: 100.71 mL/min
Glucose, Bld: 102 mg/dL — ABNORMAL HIGH (ref 70–99)
Potassium: 4.1 meq/L (ref 3.5–5.1)
Sodium: 142 meq/L (ref 135–145)

## 2016-09-06 LAB — HEPATIC FUNCTION PANEL
ALT: 15 U/L (ref 0–35)
AST: 17 U/L (ref 0–37)
Albumin: 4.7 g/dL (ref 3.5–5.2)
Alkaline Phosphatase: 84 U/L (ref 39–117)
Bilirubin, Direct: 0.1 mg/dL (ref 0.0–0.3)
Total Bilirubin: 0.4 mg/dL (ref 0.2–1.2)
Total Protein: 7.2 g/dL (ref 6.0–8.3)

## 2016-09-06 NOTE — Telephone Encounter (Signed)
Pt states that mail order has no refills on rosuvastatin (CRESTOR) 5 MG tablet. Please advise

## 2016-09-06 NOTE — Telephone Encounter (Signed)
Called pharmacy they have the script that we sent on 1-18. I have called patient and let her know.

## 2016-09-07 ENCOUNTER — Encounter: Payer: Self-pay | Admitting: Internal Medicine

## 2016-09-12 ENCOUNTER — Other Ambulatory Visit: Payer: Self-pay | Admitting: Internal Medicine

## 2016-09-12 NOTE — Telephone Encounter (Signed)
This was refilled on 08/16/16, for #30 with no refills, please advise, thanks

## 2016-09-19 ENCOUNTER — Telehealth: Payer: Self-pay | Admitting: Internal Medicine

## 2016-09-19 NOTE — Telephone Encounter (Signed)
Spoke with patient states she didn't eat supper last night .  Ate breakfast this am feels better.  States she is still having quivering over left eyelid she states this has been happening for over 1 week especially since dizziness episode this am.   Advised patient to go to ED for evaluation or urgent care for evaluation Patient verbalized understanding.

## 2016-09-19 NOTE — Telephone Encounter (Signed)
Pt called and wanted to know if she can come in to have her bp checked. Pt states that she was feeling light headed. Please advise, thank you!  Call pt @ [phone]

## 2016-09-19 NOTE — Telephone Encounter (Signed)
Reason for call: light headed,  Symptoms: light headed BP 940 141/81, 1000 138/74 pulse 59 , 1052 137/89 , 1027 128/74 pulse 54, above left describes quivering sensation , history of eye surgery, drank cappuccino ,patient states she has not eaten breakfast, denies arm numbness, no chest pain, right shoulder pain joint chronic, does complain of allergies nasal congestion , clear -yellow drainage Duration dizziness this am, allergies ongoing  Medications:Zyrtec, Nasal saline spray,  Please advise if she can comes in for Blood pressure check no appointment available

## 2016-09-19 NOTE — Telephone Encounter (Signed)
If having quivering sensation and feeling light headed, she needs to be seen for evaluation.

## 2016-09-20 ENCOUNTER — Encounter: Payer: Self-pay | Admitting: Internal Medicine

## 2016-09-20 ENCOUNTER — Ambulatory Visit (INDEPENDENT_AMBULATORY_CARE_PROVIDER_SITE_OTHER): Payer: PPO | Admitting: Internal Medicine

## 2016-09-20 VITALS — BP 110/68 | HR 65 | Temp 98.4°F | Resp 12 | Ht 63.5 in | Wt 148.6 lb

## 2016-09-20 DIAGNOSIS — M5033 Other cervical disc degeneration, cervicothoracic region: Secondary | ICD-10-CM | POA: Diagnosis not present

## 2016-09-20 DIAGNOSIS — R42 Dizziness and giddiness: Secondary | ICD-10-CM

## 2016-09-20 DIAGNOSIS — E7800 Pure hypercholesterolemia, unspecified: Secondary | ICD-10-CM | POA: Diagnosis not present

## 2016-09-20 DIAGNOSIS — M9901 Segmental and somatic dysfunction of cervical region: Secondary | ICD-10-CM | POA: Diagnosis not present

## 2016-09-20 DIAGNOSIS — E039 Hypothyroidism, unspecified: Secondary | ICD-10-CM | POA: Diagnosis not present

## 2016-09-20 DIAGNOSIS — M5412 Radiculopathy, cervical region: Secondary | ICD-10-CM | POA: Diagnosis not present

## 2016-09-20 DIAGNOSIS — F439 Reaction to severe stress, unspecified: Secondary | ICD-10-CM

## 2016-09-20 DIAGNOSIS — M9903 Segmental and somatic dysfunction of lumbar region: Secondary | ICD-10-CM | POA: Diagnosis not present

## 2016-09-20 NOTE — Progress Notes (Signed)
Pre-visit discussion using our clinic review tool. No additional management support is needed unless otherwise documented below in the visit note.  

## 2016-09-20 NOTE — Telephone Encounter (Signed)
Please call and see how pt is doing.  If she needs to be seen, I can work her in 09/20/16.  (the 2:00 pt on 09/20/16 has been admitted to the hospital and I can see her then if needed).  Let me know.

## 2016-09-20 NOTE — Telephone Encounter (Signed)
Per Dr. Nicki Reaper spoke with patient put in app for today.

## 2016-09-20 NOTE — Progress Notes (Signed)
Patient ID: Katrina BIRKEL, female   DOB: [birth year], 71 y.o.   MRN: [id number]   Subjective:    Patient ID: [name], female    DOB: 1946-05-12, 71 y.o.   MRN: [id number]  HPI  Patient here as a work in with concerns regarding some light headedness and eye twitching.  States she did not eat supper Sunday evening.  Got up 2-3x Sunday night to urinate.  No dysuria.  States when woke the next morning, she got up and made herself a cup of coffee.  Felt light headedn.  Blood pressure was 141/81.  She waited a few minutes and blood pressure decreased - 127/79.  She ate something and felt better.  The light headedness has essentially resolved.  Still with some eyelid twitching.  No vision change.  No slurring of speech.  No facial droop.  Feeling better today.  No chest pain.  No sob.  No increased heart rate or palpitations.  No acid reflux.  No abdominal pain.  Bowels moving.  Increased stress.  Discussed with her today.  She is off buspar.  Feels she needs to get back on the medication.    Past Medical History:  Diagnosis Date  . Arthritis   . Environmental allergies   . Esophagitis    [place], followed by Dr Vennie Homans  . GERD (gastroesophageal reflux disease)   . Hypercholesterolemia   . Hypothyroidism    Past Surgical History:  Procedure Laterality Date  . [place]  . KNEE ARTHROSCOPY  12/03/07   left  . KNEE ARTHROSCOPY  06/25/08   left  . NASAL SINUS SURGERY  12/06  . REPLACEMENT TOTAL KNEE  01/04/09   left  . RHINOPLASTY  1986  . VAGINAL HYSTERECTOMY  1980   ovaries left in place  . WRIST SURGERY  1970   cyst removal   Family History  Problem Relation Age of Onset  . Heart disease Father   . Hyperlipidemia Paternal Grandmother   . Hyperlipidemia Paternal Grandfather   . Prostate cancer Maternal Uncle   . Pancreatic cancer Cousin   . Multiple myeloma Brother   . Breast cancer Maternal Aunt   . Breast cancer Maternal Aunt    Social History   Social History    . Marital status: Widowed    Spouse name: N/A  . Number of children: N/A  . Years of education: N/A   Social History Main Topics  . Smoking status: Former Smoker    Years: 5.00    Types: Cigarettes  . Smokeless tobacco: Never Used  . Alcohol use No  . Drug use: No  . Sexual activity: Yes   Other Topics Concern  . None   Social History Narrative  . None    Outpatient Encounter Prescriptions as of 09/20/2016  Medication Sig  . acetaminophen (TYLENOL) 500 MG tablet Take 500 mg by mouth every 6 (six) hours as needed.  . Ascorbic Acid (VITAMIN C) 1000 MG tablet Take 1,000 mg by mouth daily.  Marland Kitchen BIOTIN 5000 PO Take 1 tablet by mouth daily.  . busPIRone (BUSPAR) 5 MG tablet Take 1 tablet (5 mg total) by mouth 2 (two) times daily.  . Cholecalciferol (VITAMIN D PO) Take 5,000 Units by mouth daily.  . ciprofloxacin (CIPRO) 500 MG tablet Take 1 tablet (500 mg total) by mouth 2 (two) times daily.  . Coenzyme Q10 60 MG TABS Take 120 mg by mouth daily.  . diclofenac sodium (VOLTAREN) 1 %  GEL Apply 1 application topically as needed.  Marland Kitchen esomeprazole (NEXIUM) 40 MG capsule Take 40 mg by mouth daily as needed.   . Hypertonic Nasal Wash (SINUS RINSE NA) Place into the nose as needed.  Marland Kitchen levothyroxine (LEVOTHROID) 75 MCG tablet Take 1 tablet (75 mcg total) by mouth every other day.  . levothyroxine (SYNTHROID, LEVOTHROID) 50 MCG tablet Take 1 tablet (50 mcg total) by mouth every other day.  . meloxicam (MOBIC) 7.5 MG tablet TAKE 1 TABLET (7.5 MG TOTAL) BY MOUTH DAILY.  Marland Kitchen Methylsulfonylmethane (MSM) POWD Take 4 g by mouth daily.  . Multiple Vitamin (MULTIVITAMIN) LIQD Take 5 mLs by mouth daily.  . mupirocin ointment (BACTROBAN) 2 % Place 1 application into the nose 2 (two) times daily.  Marland Kitchen nystatin cream (MYCOSTATIN) Apply 1 application topically 2 (two) times daily.  . Omega-3 Fatty Acids (FISH OIL) 1360 MG CAPS Take by mouth daily.  Marland Kitchen omeprazole (PRILOSEC) 20 MG capsule Take 1 capsule (20 mg  total) by mouth daily.  . Probiotic Product (PROBIOTIC DAILY PO) Take 1 capsule by mouth.  . rosuvastatin (CRESTOR) 5 MG tablet Take 1 tablet (5 mg total) by mouth daily.  . TOLAK 4 % CREA Apply daily from knees down  . triamcinolone (KENALOG) 0.025 % ointment Apply 1 application topically 2 (two) times daily.  . vitamin E 400 UNIT capsule Take 400 Units by mouth daily.   No facility-administered encounter medications on file as of 09/20/2016.     Review of Systems  Constitutional: Negative for appetite change and unexpected weight change.  HENT: Negative for congestion and sinus pressure.   Respiratory: Negative for cough, chest tightness and shortness of breath.   Cardiovascular: Negative for chest pain, palpitations and leg swelling.  Gastrointestinal: Negative for abdominal pain, diarrhea, nausea and vomiting.  Genitourinary: Negative for difficulty urinating and dysuria.  Musculoskeletal: Negative for back pain and joint swelling.  Skin: Negative for color change and rash.  Neurological: Positive for light-headedness. Negative for headaches.  Psychiatric/Behavioral: Negative for agitation and dysphoric mood.       Increased stress.        Objective:    Physical Exam  Constitutional: She appears well-developed and well-nourished. No distress.  HENT:  Nose: Nose normal.  Mouth/Throat: Oropharynx is clear and moist.  Neck: Neck supple. No thyromegaly present.  Cardiovascular: Normal rate and regular rhythm.   Pulmonary/Chest: Breath sounds normal. No respiratory distress. She has no wheezes.  Abdominal: Soft. Bowel sounds are normal. There is no tenderness.  Musculoskeletal: She exhibits no edema or tenderness.  Lymphadenopathy:    She has no cervical adenopathy.  Skin: No rash noted. No erythema.  Psychiatric: She has a normal mood and affect. Her behavior is normal.    BP 110/68 (BP Location: Left Arm, Patient Position: Sitting, Cuff Size: Normal)   Pulse 65   Temp  98.4 F (36.9 C) (Oral)   Resp 12   Ht 5' 3.5" (1.613 m)   Wt 148 lb 9.6 oz (67.4 kg)   SpO2 97%   BMI 25.91 kg/m  Wt Readings from Last 3 Encounters:  09/20/16 148 lb 9.6 oz (67.4 kg)  08/16/16 146 lb 6 oz (66.4 kg)  08/02/16 149 lb (67.6 kg)     Lab Results  Component Value Date   WBC 5.3 12/23/2015   HGB 13.2 12/23/2015   HCT 39.4 12/23/2015   PLT 280.0 12/23/2015   GLUCOSE 102 (H) 09/06/2016   CHOL 199 09/06/2016   TRIG  164.0 (H) 09/06/2016   HDL 63.50 09/06/2016   LDLDIRECT 123.4 02/27/2013   LDLCALC 103 (H) 09/06/2016   ALT 15 09/06/2016   AST 17 09/06/2016   NA 142 09/06/2016   K 4.1 09/06/2016   CL 104 09/06/2016   CREATININE 0.62 09/06/2016   BUN 12 09/06/2016   CO2 30 09/06/2016   TSH 0.36 09/06/2016       Assessment & Plan:   Problem List Items Addressed This Visit    Hypercholesteremia    Low cholesterol diet and exercise.  Follow lipid panel.        Hypothyroidism    On thyroid replacement.  Follow tsh.        Light headedness    Better now.  Blood pressure doing well.  EKG - SR with no acute ischemic changes.  Eating regular meals.  Discussed importance of not going long periods without eating.  Stay hydrated.  Discussed further w/up including scanning and ENT evaluation.  She wants to monitor.  Follow closely.  Restart buspar.        Stress    Increased stress.  Discussed with her today.  Restart buspar.  Follow.         Other Visit Diagnoses    Dizziness    -  Primary   Relevant Orders   EKG 12-Lead (Completed)       Einar Pheasant, MD

## 2016-09-25 ENCOUNTER — Encounter: Payer: Self-pay | Admitting: Internal Medicine

## 2016-09-25 DIAGNOSIS — R42 Dizziness and giddiness: Secondary | ICD-10-CM | POA: Insufficient documentation

## 2016-09-25 NOTE — Assessment & Plan Note (Signed)
Low cholesterol diet and exercise.  Follow lipid panel.   

## 2016-09-25 NOTE — Assessment & Plan Note (Signed)
Increased stress.  Discussed with her today.  Restart buspar.  Follow.

## 2016-09-25 NOTE — Assessment & Plan Note (Signed)
Better now.  Blood pressure doing well.  EKG - SR with no acute ischemic changes.  Eating regular meals.  Discussed importance of not going long periods without eating.  Stay hydrated.  Discussed further w/up including scanning and ENT evaluation.  She wants to monitor.  Follow closely.  Restart buspar.

## 2016-09-25 NOTE — Assessment & Plan Note (Signed)
On thyroid replacement.  Follow tsh.  

## 2016-10-18 DIAGNOSIS — M9903 Segmental and somatic dysfunction of lumbar region: Secondary | ICD-10-CM | POA: Diagnosis not present

## 2016-10-18 DIAGNOSIS — M5412 Radiculopathy, cervical region: Secondary | ICD-10-CM | POA: Diagnosis not present

## 2016-10-18 DIAGNOSIS — M9901 Segmental and somatic dysfunction of cervical region: Secondary | ICD-10-CM | POA: Diagnosis not present

## 2016-10-18 DIAGNOSIS — M5033 Other cervical disc degeneration, cervicothoracic region: Secondary | ICD-10-CM | POA: Diagnosis not present

## 2016-10-19 DIAGNOSIS — B078 Other viral warts: Secondary | ICD-10-CM | POA: Diagnosis not present

## 2016-10-19 DIAGNOSIS — L570 Actinic keratosis: Secondary | ICD-10-CM | POA: Diagnosis not present

## 2016-10-19 DIAGNOSIS — Z872 Personal history of diseases of the skin and subcutaneous tissue: Secondary | ICD-10-CM | POA: Diagnosis not present

## 2016-10-19 DIAGNOSIS — L565 Disseminated superficial actinic porokeratosis (DSAP): Secondary | ICD-10-CM | POA: Diagnosis not present

## 2016-10-19 DIAGNOSIS — L821 Other seborrheic keratosis: Secondary | ICD-10-CM | POA: Diagnosis not present

## 2016-10-19 DIAGNOSIS — L814 Other melanin hyperpigmentation: Secondary | ICD-10-CM | POA: Diagnosis not present

## 2016-10-21 ENCOUNTER — Telehealth: Payer: Self-pay | Admitting: Internal Medicine

## 2016-10-21 NOTE — Telephone Encounter (Signed)
Does she want this to go to mail order of local.  Ok to refill.

## 2016-10-21 NOTE — Telephone Encounter (Signed)
The patient left a message needing a refill on busPIRone (BUSPAR) 5 MG tablet

## 2016-10-21 NOTE — Telephone Encounter (Signed)
Ok to refill 

## 2016-10-24 ENCOUNTER — Emergency Department: Admission: EM | Admit: 2016-10-24 | Discharge: 2016-10-24 | Discharge status: Absent without leave | Payer: PPO

## 2016-10-24 MED ORDER — BUSPIRONE HCL 5 MG PO TABS: 5.0000 mg | ORAL_TABLET | Freq: Two times a day (BID) | ORAL | 1 refills | Status: DC

## 2016-10-24 NOTE — Telephone Encounter (Signed)
Pt called back and the message was given to pt regarding the mail order for her Rx.

## 2016-10-24 NOTE — Telephone Encounter (Signed)
Refill was received from Mail order will send to them

## 2016-10-25 DIAGNOSIS — L03012 Cellulitis of left finger: Secondary | ICD-10-CM | POA: Diagnosis not present

## 2016-10-25 DIAGNOSIS — M9901 Segmental and somatic dysfunction of cervical region: Secondary | ICD-10-CM | POA: Diagnosis not present

## 2016-10-25 DIAGNOSIS — M5412 Radiculopathy, cervical region: Secondary | ICD-10-CM | POA: Diagnosis not present

## 2016-10-25 DIAGNOSIS — M9903 Segmental and somatic dysfunction of lumbar region: Secondary | ICD-10-CM | POA: Diagnosis not present

## 2016-10-25 DIAGNOSIS — M5033 Other cervical disc degeneration, cervicothoracic region: Secondary | ICD-10-CM | POA: Diagnosis not present

## 2016-11-01 DIAGNOSIS — M5412 Radiculopathy, cervical region: Secondary | ICD-10-CM | POA: Diagnosis not present

## 2016-11-01 DIAGNOSIS — M5033 Other cervical disc degeneration, cervicothoracic region: Secondary | ICD-10-CM | POA: Diagnosis not present

## 2016-11-01 DIAGNOSIS — M9901 Segmental and somatic dysfunction of cervical region: Secondary | ICD-10-CM | POA: Diagnosis not present

## 2016-11-01 DIAGNOSIS — M9903 Segmental and somatic dysfunction of lumbar region: Secondary | ICD-10-CM | POA: Diagnosis not present

## 2016-11-11 ENCOUNTER — Other Ambulatory Visit (INDEPENDENT_AMBULATORY_CARE_PROVIDER_SITE_OTHER): Payer: PPO

## 2016-11-11 DIAGNOSIS — E039 Hypothyroidism, unspecified: Secondary | ICD-10-CM

## 2016-11-11 LAB — TSH: TSH: 0.49 u[IU]/mL (ref 0.35–4.50)

## 2016-11-12 ENCOUNTER — Encounter: Payer: Self-pay | Admitting: Internal Medicine

## 2016-11-15 ENCOUNTER — Encounter: Payer: Self-pay | Admitting: Internal Medicine

## 2016-11-15 ENCOUNTER — Ambulatory Visit (INDEPENDENT_AMBULATORY_CARE_PROVIDER_SITE_OTHER): Payer: PPO | Admitting: Internal Medicine

## 2016-11-15 VITALS — BP 110/68 | HR 65 | Temp 98.6°F | Resp 14 | Ht 64.0 in | Wt 148.2 lb

## 2016-11-15 DIAGNOSIS — E7800 Pure hypercholesterolemia, unspecified: Secondary | ICD-10-CM | POA: Diagnosis not present

## 2016-11-15 DIAGNOSIS — F439 Reaction to severe stress, unspecified: Secondary | ICD-10-CM

## 2016-11-15 DIAGNOSIS — Z1239 Encounter for other screening for malignant neoplasm of breast: Secondary | ICD-10-CM

## 2016-11-15 DIAGNOSIS — J069 Acute upper respiratory infection, unspecified: Secondary | ICD-10-CM | POA: Diagnosis not present

## 2016-11-15 DIAGNOSIS — Z1231 Encounter for screening mammogram for malignant neoplasm of breast: Secondary | ICD-10-CM

## 2016-11-15 DIAGNOSIS — E039 Hypothyroidism, unspecified: Secondary | ICD-10-CM

## 2016-11-15 DIAGNOSIS — K2100 Gastro-esophageal reflux disease with esophagitis, without bleeding: Secondary | ICD-10-CM

## 2016-11-15 DIAGNOSIS — R739 Hyperglycemia, unspecified: Secondary | ICD-10-CM

## 2016-11-15 MED ORDER — AMOXICILLIN 875 MG PO TABS: 875.0000 mg | ORAL_TABLET | Freq: Two times a day (BID) | ORAL | 0 refills | Status: DC

## 2016-11-15 NOTE — Progress Notes (Signed)
Pre-visit discussion using our clinic review tool. No additional management support is needed unless otherwise documented below in the visit note.  

## 2016-11-15 NOTE — Progress Notes (Signed)
Patient ID: [name], female   DOB: [birth year], 71 y.o.   MRN: [id number]   Subjective:    Patient ID: [name], female    DOB: 01/01/1946, 71 y.o.   MRN: [id number]  HPI  Patient here for a scheduled follow up.  She is doing better overall.  Feels better.  Stress is better.  She does report that starting last week, she developed scratchy throat.  Was working outside in her yard.  Feels triggered.  Some increased nasal congestion.  Drainage.  No sinus pressure.  Some chest congestion and increased cough. No wheezing.  No chest pain.  No acid reflux.  Controlled.  No abdominal pain.  Bowels moving.     Past Medical History:  Diagnosis Date  . Arthritis   . Environmental allergies   . Esophagitis    [place], followed by Dr Vennie Homans  . GERD (gastroesophageal reflux disease)   . Hypercholesterolemia   . Hypothyroidism    Past Surgical History:  Procedure Laterality Date  . [place]  . KNEE ARTHROSCOPY  12/03/07   left  . KNEE ARTHROSCOPY  06/25/08   left  . NASAL SINUS SURGERY  12/06  . REPLACEMENT TOTAL KNEE  01/04/09   left  . RHINOPLASTY  1986  . VAGINAL HYSTERECTOMY  1980   ovaries left in place  . WRIST SURGERY  1970   cyst removal   Family History  Problem Relation Age of Onset  . Heart disease Father   . Hyperlipidemia Paternal Grandmother   . Hyperlipidemia Paternal Grandfather   . Prostate cancer Maternal Uncle   . Pancreatic cancer Cousin   . Multiple myeloma Brother   . Breast cancer Maternal Aunt   . Breast cancer Maternal Aunt    Social History   Social History  . Marital status: Widowed    Spouse name: N/A  . Number of children: N/A  . Years of education: N/A   Social History Main Topics  . Smoking status: Former Smoker    Years: 5.00    Types: Cigarettes  . Smokeless tobacco: Never Used  . Alcohol use No  . Drug use: No  . Sexual activity: Yes   Other Topics Concern  . None   Social History Narrative  . None     Outpatient Encounter Prescriptions as of 11/15/2016  Medication Sig  . acetaminophen (TYLENOL) 500 MG tablet Take 500 mg by mouth every 6 (six) hours as needed.  . Ascorbic Acid (VITAMIN C) 1000 MG tablet Take 1,000 mg by mouth daily.  Marland Kitchen BIOTIN 5000 PO Take 1 tablet by mouth daily.  . busPIRone (BUSPAR) 5 MG tablet Take 1 tablet (5 mg total) by mouth 2 (two) times daily.  . Cholecalciferol (VITAMIN D PO) Take 5,000 Units by mouth daily.  . Coenzyme Q10 60 MG TABS Take 120 mg by mouth daily.  . diclofenac sodium (VOLTAREN) 1 % GEL Apply 1 application topically as needed.  Marland Kitchen esomeprazole (NEXIUM) 40 MG capsule Take 40 mg by mouth daily as needed.   . Hypertonic Nasal Wash (SINUS RINSE NA) Place into the nose as needed.  Marland Kitchen levothyroxine (LEVOTHROID) 75 MCG tablet Take 1 tablet (75 mcg total) by mouth every other day.  . levothyroxine (SYNTHROID, LEVOTHROID) 50 MCG tablet Take 1 tablet (50 mcg total) by mouth every other day.  . meloxicam (MOBIC) 7.5 MG tablet TAKE 1 TABLET (7.5 MG TOTAL) BY MOUTH DAILY.  . Multiple Vitamin (MULTIVITAMIN) LIQD Take  5 mLs by mouth daily.  . mupirocin ointment (BACTROBAN) 2 % Place 1 application into the nose 2 (two) times daily.  Marland Kitchen nystatin cream (MYCOSTATIN) Apply 1 application topically 2 (two) times daily.  . Omega-3 Fatty Acids (FISH OIL) 1360 MG CAPS Take by mouth daily.  Marland Kitchen omeprazole (PRILOSEC) 20 MG capsule Take 1 capsule (20 mg total) by mouth daily.  . Probiotic Product (PROBIOTIC DAILY PO) Take 1 capsule by mouth.  . rosuvastatin (CRESTOR) 5 MG tablet Take 1 tablet (5 mg total) by mouth daily.  . TOLAK 4 % CREA Apply daily from knees down  . triamcinolone (KENALOG) 0.025 % ointment Apply 1 application topically 2 (two) times daily.  . vitamin E 400 UNIT capsule Take 400 Units by mouth daily.  Marland Kitchen amoxicillin (AMOXIL) 875 MG tablet Take 1 tablet (875 mg total) by mouth 2 (two) times daily.  . [DISCONTINUED] ciprofloxacin (CIPRO) 500 MG tablet Take 1  tablet (500 mg total) by mouth 2 (two) times daily.  . [DISCONTINUED] Methylsulfonylmethane (MSM) POWD Take 4 g by mouth daily.   No facility-administered encounter medications on file as of 11/15/2016.     Review of Systems  Constitutional: Negative for appetite change and unexpected weight change.  HENT: Positive for congestion and postnasal drip. Negative for sinus pressure.   Respiratory: Positive for cough. Negative for chest tightness and shortness of breath.   Cardiovascular: Negative for chest pain, palpitations and leg swelling.  Gastrointestinal: Negative for abdominal pain, diarrhea, nausea and vomiting.  Genitourinary: Negative for difficulty urinating and dysuria.  Musculoskeletal: Negative for back pain and joint swelling.  Skin: Negative for color change and rash.  Neurological: Negative for dizziness, light-headedness and headaches.  Psychiatric/Behavioral: Negative for agitation and dysphoric mood.       Objective:    Physical Exam  Constitutional: She appears well-developed and well-nourished. No distress.  HENT:  Mouth/Throat: Oropharynx is clear and moist.  Nares - slightly erythematous turbinates.    Neck: Neck supple. No thyromegaly present.  Cardiovascular: Normal rate and regular rhythm.   Pulmonary/Chest: Breath sounds normal. No respiratory distress. She has no wheezes.  Abdominal: Soft. Bowel sounds are normal. There is no tenderness.  Musculoskeletal: She exhibits no edema or tenderness.  Lymphadenopathy:    She has no cervical adenopathy.  Skin: No rash noted. No erythema.  Psychiatric: She has a normal mood and affect. Her behavior is normal.    BP 110/68 (BP Location: Left Arm, Patient Position: Sitting, Cuff Size: Normal)   Pulse 65   Temp 98.6 F (37 C) (Oral)   Resp 14   Ht '5\' 4"'$  (1.626 m)   Wt 148 lb 3.2 oz (67.2 kg)   SpO2 96%   BMI 25.44 kg/m  Wt Readings from Last 3 Encounters:  11/15/16 148 lb 3.2 oz (67.2 kg)  09/20/16 148 lb 9.6  oz (67.4 kg)  08/16/16 146 lb 6 oz (66.4 kg)     Lab Results  Component Value Date   WBC 5.3 12/23/2015   HGB 13.2 12/23/2015   HCT 39.4 12/23/2015   PLT 280.0 12/23/2015   GLUCOSE 102 (H) 09/06/2016   CHOL 199 09/06/2016   TRIG 164.0 (H) 09/06/2016   HDL 63.50 09/06/2016   LDLDIRECT 123.4 02/27/2013   LDLCALC 103 (H) 09/06/2016   ALT 15 09/06/2016   AST 17 09/06/2016   NA 142 09/06/2016   K 4.1 09/06/2016   CL 104 09/06/2016   CREATININE 0.62 09/06/2016   BUN 12 09/06/2016  CO2 30 09/06/2016   TSH 0.49 11/11/2016       Assessment & Plan:   Problem List Items Addressed This Visit    GERD (gastroesophageal reflux disease)    Controlled on current regimen.  Follow.        Hypercholesteremia    Low cholesterol diet and exercise.  Follow lipid panel.        Relevant Orders   CBC with Differential/Platelet   Hepatic function panel   Lipid panel   Basic metabolic panel   Hypothyroidism    On thyroid replacement.  Follow tsh.       Stress    Doing well.  Follow.  Taking buspar.        URI (upper respiratory infection)    Increased nasal and chest congestion as outlined.  Treat with saline nasal spray, steroid nasal spray and robitussin as directed.  Since she is going out of town, will give her a prescription for amoxicillin.  She is not to take unless symptoms progress or worsen.         Other Visit Diagnoses    Screening for breast cancer    -  Primary   Relevant Orders   MM DIGITAL SCREENING BILATERAL   Hyperglycemia       Relevant Orders   Hemoglobin A1c       Einar Pheasant, MD

## 2016-11-16 DIAGNOSIS — M9901 Segmental and somatic dysfunction of cervical region: Secondary | ICD-10-CM | POA: Diagnosis not present

## 2016-11-16 DIAGNOSIS — M5412 Radiculopathy, cervical region: Secondary | ICD-10-CM | POA: Diagnosis not present

## 2016-11-16 DIAGNOSIS — M9903 Segmental and somatic dysfunction of lumbar region: Secondary | ICD-10-CM | POA: Diagnosis not present

## 2016-11-16 DIAGNOSIS — M5033 Other cervical disc degeneration, cervicothoracic region: Secondary | ICD-10-CM | POA: Diagnosis not present

## 2016-11-17 ENCOUNTER — Encounter: Payer: Self-pay | Admitting: Internal Medicine

## 2016-11-17 NOTE — Assessment & Plan Note (Signed)
Controlled on current regimen.  Follow.  

## 2016-11-17 NOTE — Assessment & Plan Note (Signed)
On thyroid replacement.  Follow tsh.  

## 2016-11-17 NOTE — Assessment & Plan Note (Signed)
Low cholesterol diet and exercise.  Follow lipid panel.   

## 2016-11-17 NOTE — Assessment & Plan Note (Signed)
Doing well.  Follow.  Taking buspar.

## 2016-11-17 NOTE — Assessment & Plan Note (Signed)
Increased nasal and chest congestion as outlined.  Treat with saline nasal spray, steroid nasal spray and robitussin as directed.  Since she is going out of town, will give her a prescription for amoxicillin.  She is not to take unless symptoms progress or worsen.

## 2016-11-28 DIAGNOSIS — M5033 Other cervical disc degeneration, cervicothoracic region: Secondary | ICD-10-CM | POA: Diagnosis not present

## 2016-11-28 DIAGNOSIS — M9903 Segmental and somatic dysfunction of lumbar region: Secondary | ICD-10-CM | POA: Diagnosis not present

## 2016-11-28 DIAGNOSIS — M9901 Segmental and somatic dysfunction of cervical region: Secondary | ICD-10-CM | POA: Diagnosis not present

## 2016-11-28 DIAGNOSIS — M5412 Radiculopathy, cervical region: Secondary | ICD-10-CM | POA: Diagnosis not present

## 2016-12-05 DIAGNOSIS — M5033 Other cervical disc degeneration, cervicothoracic region: Secondary | ICD-10-CM | POA: Diagnosis not present

## 2016-12-05 DIAGNOSIS — M5412 Radiculopathy, cervical region: Secondary | ICD-10-CM | POA: Diagnosis not present

## 2016-12-05 DIAGNOSIS — M9901 Segmental and somatic dysfunction of cervical region: Secondary | ICD-10-CM | POA: Diagnosis not present

## 2016-12-05 DIAGNOSIS — M9903 Segmental and somatic dysfunction of lumbar region: Secondary | ICD-10-CM | POA: Diagnosis not present

## 2016-12-12 DIAGNOSIS — M5412 Radiculopathy, cervical region: Secondary | ICD-10-CM | POA: Diagnosis not present

## 2016-12-12 DIAGNOSIS — M5033 Other cervical disc degeneration, cervicothoracic region: Secondary | ICD-10-CM | POA: Diagnosis not present

## 2016-12-12 DIAGNOSIS — M9903 Segmental and somatic dysfunction of lumbar region: Secondary | ICD-10-CM | POA: Diagnosis not present

## 2016-12-12 DIAGNOSIS — M9901 Segmental and somatic dysfunction of cervical region: Secondary | ICD-10-CM | POA: Diagnosis not present

## 2016-12-13 ENCOUNTER — Telehealth: Payer: Self-pay | Admitting: Internal Medicine

## 2016-12-13 NOTE — Telephone Encounter (Signed)
Reason for call: urinary frequency Symptoms: urinary frequency , getting up 3 times a nights to go to bathroom , low back pain, no dysuria , no fever Duration 2 1/2 weeks  Medications:  Tylenol Last seen for this problem: Seen by: Wants to know if you will order urine , Patient ok to waiting until Thursday

## 2016-12-13 NOTE — Telephone Encounter (Signed)
Pt called c/o pain on the left side on and just above the waist. Pt has been experiencing this for about 2 1/2 weeks. Pt says that she does go to the bathroom 3 times a night. Pt would like to have some labs she thinks that it maybe her kidney. Please advise, thank you!  Call pt @ [phone]

## 2016-12-13 NOTE — Telephone Encounter (Signed)
I have an appt on Friday 1:30.  If she is having any acute issues and unable to wait until Friday, to acute care.  Thanks.

## 2016-12-13 NOTE — Telephone Encounter (Signed)
Called patient she states no acute issues at this time will wait for app with scott on Friday she will go to walk in or ED if Symptoms worse or change.

## 2016-12-16 ENCOUNTER — Encounter: Payer: Self-pay | Admitting: Internal Medicine

## 2016-12-16 ENCOUNTER — Ambulatory Visit (INDEPENDENT_AMBULATORY_CARE_PROVIDER_SITE_OTHER): Payer: PPO | Admitting: Internal Medicine

## 2016-12-16 ENCOUNTER — Ambulatory Visit (INDEPENDENT_AMBULATORY_CARE_PROVIDER_SITE_OTHER): Payer: PPO

## 2016-12-16 VITALS — BP 120/68 | HR 74 | Temp 98.5°F | Ht 64.0 in | Wt 147.4 lb

## 2016-12-16 DIAGNOSIS — M545 Low back pain, unspecified: Secondary | ICD-10-CM

## 2016-12-16 DIAGNOSIS — F439 Reaction to severe stress, unspecified: Secondary | ICD-10-CM | POA: Diagnosis not present

## 2016-12-16 DIAGNOSIS — E039 Hypothyroidism, unspecified: Secondary | ICD-10-CM | POA: Diagnosis not present

## 2016-12-16 DIAGNOSIS — K2100 Gastro-esophageal reflux disease with esophagitis, without bleeding: Secondary | ICD-10-CM

## 2016-12-16 DIAGNOSIS — R350 Frequency of micturition: Secondary | ICD-10-CM

## 2016-12-16 DIAGNOSIS — M47816 Spondylosis without myelopathy or radiculopathy, lumbar region: Secondary | ICD-10-CM | POA: Diagnosis not present

## 2016-12-16 DIAGNOSIS — E7800 Pure hypercholesterolemia, unspecified: Secondary | ICD-10-CM

## 2016-12-16 LAB — BASIC METABOLIC PANEL WITH GFR
BUN: 16 mg/dL (ref 6–23)
CO2: 30 meq/L (ref 19–32)
Calcium: 10.1 mg/dL (ref 8.4–10.5)
Chloride: 104 meq/L (ref 96–112)
Creatinine, Ser: 0.72 mg/dL (ref 0.40–1.20)
GFR: 84.68 mL/min
Glucose, Bld: 115 mg/dL — ABNORMAL HIGH (ref 70–99)
Potassium: 4.2 meq/L (ref 3.5–5.1)
Sodium: 140 meq/L (ref 135–145)

## 2016-12-16 LAB — CBC WITH DIFFERENTIAL/PLATELET
Basophils Absolute: 0 10*3/uL (ref 0.0–0.1)
Basophils Relative: 0.4 % (ref 0.0–3.0)
Eosinophils Absolute: 0.1 10*3/uL (ref 0.0–0.7)
Eosinophils Relative: 1.1 % (ref 0.0–5.0)
HCT: 37.7 % (ref 36.0–46.0)
Hemoglobin: 12.9 g/dL (ref 12.0–15.0)
Lymphocytes Relative: 33.4 % (ref 12.0–46.0)
Lymphs Abs: 1.8 10*3/uL (ref 0.7–4.0)
MCHC: 34.1 g/dL (ref 30.0–36.0)
MCV: 96 fl (ref 78.0–100.0)
Monocytes Absolute: 0.4 10*3/uL (ref 0.1–1.0)
Monocytes Relative: 7.9 % (ref 3.0–12.0)
Neutro Abs: 3 10*3/uL (ref 1.4–7.7)
Neutrophils Relative %: 57.2 % (ref 43.0–77.0)
Platelets: 243 10*3/uL (ref 150.0–400.0)
RBC: 3.93 Mil/uL (ref 3.87–5.11)
RDW: 12.7 % (ref 11.5–15.5)
WBC: 5.3 10*3/uL (ref 4.0–10.5)

## 2016-12-16 LAB — POCT URINALYSIS DIPSTICK
Bilirubin, UA: NEGATIVE
Blood, UA: NEGATIVE
Glucose, UA: NEGATIVE
Ketones, UA: NEGATIVE
Nitrite, UA: NEGATIVE
Protein, UA: NEGATIVE
Spec Grav, UA: 1.015
Urobilinogen, UA: 0.2 U/dL
pH, UA: 5.5

## 2016-12-16 LAB — URINALYSIS, MICROSCOPIC ONLY: RBC / HPF: NONE SEEN

## 2016-12-16 NOTE — Progress Notes (Signed)
Patient ID: [name], female   DOB: Jul 23, 1945, 71 y.o.   MRN: [id number]   Subjective:    Patient ID: [name], female    DOB: 11/01/1945, 71 y.o.   MRN: [id number]  HPI  Patient here as a work in with concerns regarding increased urinary frequency.  She has some issues with back discomfort.  Sees Dr Joyce Copa.  Told had a pinched nerve.  Treatments and adjustments with Dr Joyce Copa - help.  Back is better.  Reports some increased urinary frequency.  No dysuria.  Nocturia x 3.  Took meloxicam yesterday.  Taking tylenol arthritis.  States otherwise doing relatively well.  No acid reflux.  No abdominal pain. Bowels moving.     Past Medical History:  Diagnosis Date  . Arthritis   . Environmental allergies   . Esophagitis    [place], followed by Dr Vennie Homans  . GERD (gastroesophageal reflux disease)   . Hypercholesterolemia   . Hypothyroidism    Past Surgical History:  Procedure Laterality Date  . [place]  . KNEE ARTHROSCOPY  12/03/07   left  . KNEE ARTHROSCOPY  06/25/08   left  . NASAL SINUS SURGERY  12/06  . REPLACEMENT TOTAL KNEE  01/04/09   left  . RHINOPLASTY  1986  . VAGINAL HYSTERECTOMY  1980   ovaries left in place  . WRIST SURGERY  1970   cyst removal   Family History  Problem Relation Age of Onset  . Heart disease Father   . Hyperlipidemia Paternal Grandmother   . Hyperlipidemia Paternal Grandfather   . Prostate cancer Maternal Uncle   . Pancreatic cancer Cousin   . Multiple myeloma Brother   . Breast cancer Maternal Aunt   . Breast cancer Maternal Aunt    Social History   Social History  . Marital status: Widowed    Spouse name: N/A  . Number of children: N/A  . Years of education: N/A   Social History Main Topics  . Smoking status: Former Smoker    Years: 5.00    Types: Cigarettes  . Smokeless tobacco: Never Used  . Alcohol use No  . Drug use: No  . Sexual activity: Yes   Other Topics Concern  . None   Social History Narrative    . None    Outpatient Encounter Prescriptions as of 12/16/2016  Medication Sig  . acetaminophen (TYLENOL) 500 MG tablet Take 500 mg by mouth every 6 (six) hours as needed.  Marland Kitchen amoxicillin (AMOXIL) 875 MG tablet Take 1 tablet (875 mg total) by mouth 2 (two) times daily.  . Ascorbic Acid (VITAMIN C) 1000 MG tablet Take 1,000 mg by mouth daily.  Marland Kitchen BIOTIN 5000 PO Take 1 tablet by mouth daily.  . busPIRone (BUSPAR) 5 MG tablet Take 1 tablet (5 mg total) by mouth 2 (two) times daily.  . Cholecalciferol (VITAMIN D PO) Take 5,000 Units by mouth daily.  . Coenzyme Q10 60 MG TABS Take 120 mg by mouth daily.  . diclofenac sodium (VOLTAREN) 1 % GEL Apply 1 application topically as needed.  Marland Kitchen esomeprazole (NEXIUM) 40 MG capsule Take 40 mg by mouth daily as needed.   . Hypertonic Nasal Wash (SINUS RINSE NA) Place into the nose as needed.  Marland Kitchen levothyroxine (LEVOTHROID) 75 MCG tablet Take 1 tablet (75 mcg total) by mouth every other day.  . levothyroxine (SYNTHROID, LEVOTHROID) 50 MCG tablet Take 1 tablet (50 mcg total) by mouth every other day.  Marland Kitchen  meloxicam (MOBIC) 7.5 MG tablet TAKE 1 TABLET (7.5 MG TOTAL) BY MOUTH DAILY.  . Multiple Vitamin (MULTIVITAMIN) LIQD Take 5 mLs by mouth daily.  . mupirocin ointment (BACTROBAN) 2 % Place 1 application into the nose 2 (two) times daily.  Marland Kitchen nystatin cream (MYCOSTATIN) Apply 1 application topically 2 (two) times daily.  . Omega-3 Fatty Acids (FISH OIL) 1360 MG CAPS Take by mouth daily.  Marland Kitchen omeprazole (PRILOSEC) 20 MG capsule Take 1 capsule (20 mg total) by mouth daily.  . Probiotic Product (PROBIOTIC DAILY PO) Take 1 capsule by mouth.  . rosuvastatin (CRESTOR) 5 MG tablet Take 1 tablet (5 mg total) by mouth daily.  . TOLAK 4 % CREA Apply daily from knees down  . triamcinolone (KENALOG) 0.025 % ointment Apply 1 application topically 2 (two) times daily.  . vitamin E 400 UNIT capsule Take 400 Units by mouth daily.   No facility-administered encounter medications on  file as of 12/16/2016.     Review of Systems  Constitutional: Negative for appetite change and unexpected weight change.  HENT: Negative for congestion.   Respiratory: Negative for cough, chest tightness and shortness of breath.   Cardiovascular: Negative for chest pain and leg swelling.  Gastrointestinal: Negative for abdominal pain, diarrhea, nausea and vomiting.  Genitourinary: Positive for frequency. Negative for difficulty urinating and dysuria.  Musculoskeletal: Negative for myalgias.       Back pain is better.    Skin: Negative for color change and rash.  Neurological: Negative for dizziness, light-headedness and headaches.  Psychiatric/Behavioral: Negative for agitation and dysphoric mood.       Objective:    Physical Exam  Constitutional: She appears well-developed and well-nourished. No distress.  Neck: Neck supple.  Cardiovascular: Normal rate and regular rhythm.   Pulmonary/Chest: Breath sounds normal. No respiratory distress. She has no wheezes.  Abdominal: Soft. Bowel sounds are normal. There is no tenderness.  Musculoskeletal: She exhibits no edema or tenderness.  Lymphadenopathy:    She has no cervical adenopathy.  Skin: No rash noted. No erythema.  Psychiatric: She has a normal mood and affect. Her behavior is normal.    BP 120/68   Pulse 74   Temp 98.5 F (36.9 C) (Oral)   Ht 5' 4"  (1.626 m)   Wt 147 lb 6.4 oz (66.9 kg)   SpO2 96%   BMI 25.30 kg/m  Wt Readings from Last 3 Encounters:  12/16/16 147 lb 6.4 oz (66.9 kg)  11/15/16 148 lb 3.2 oz (67.2 kg)  09/20/16 148 lb 9.6 oz (67.4 kg)     Lab Results  Component Value Date   WBC 5.3 12/16/2016   HGB 12.9 12/16/2016   HCT 37.7 12/16/2016   PLT 243.0 12/16/2016   GLUCOSE 115 (H) 12/16/2016   CHOL 199 09/06/2016   TRIG 164.0 (H) 09/06/2016   HDL 63.50 09/06/2016   LDLDIRECT 123.4 02/27/2013   LDLCALC 103 (H) 09/06/2016   ALT 15 09/06/2016   AST 17 09/06/2016   NA 140 12/16/2016   K 4.2  12/16/2016   CL 104 12/16/2016   CREATININE 0.72 12/16/2016   BUN 16 12/16/2016   CO2 30 12/16/2016   TSH 0.49 11/11/2016       Assessment & Plan:   Problem List Items Addressed This Visit    GERD (gastroesophageal reflux disease)    Controlled on current regimen.  Follow.        Hypercholesteremia    On crestor.  Low cholesterol diet and exercise.  Follow lipid panel and liver function tests.        Hypothyroidism    On thyroid replacement.  Follow tsh.        Stress    Doing better.  Takes buspar.  Follow.  Stable.        Other Visit Diagnoses    Bilateral low back pain without sciatica, unspecified chronicity    -  Primary   Relevant Orders   DG Lumbar Spine Complete (Completed)   Increased urinary frequency       Check urinalysis to confirm no infection.     Relevant Orders   POCT Urinalysis Dipstick (Completed)   Urinary frequency       Relevant Orders   Urine Culture (Completed)   Urine Microscopic Only (Completed)       Einar Pheasant, MD

## 2016-12-16 NOTE — Progress Notes (Signed)
Pre visit review using our clinic review tool, if applicable. No additional management support is needed unless otherwise documented below in the visit note. 

## 2016-12-17 LAB — URINE CULTURE: Organism ID, Bacteria: NO GROWTH

## 2016-12-18 ENCOUNTER — Encounter: Payer: Self-pay | Admitting: Internal Medicine

## 2016-12-18 NOTE — Assessment & Plan Note (Signed)
Controlled on current regimen.  Follow.  

## 2016-12-18 NOTE — Assessment & Plan Note (Signed)
On crestor.  Low cholesterol diet and exercise.  Follow lipid panel and liver function tests.   

## 2016-12-18 NOTE — Assessment & Plan Note (Signed)
On thyroid replacement.  Follow tsh.  

## 2016-12-18 NOTE — Assessment & Plan Note (Signed)
Doing better.  Takes buspar.  Follow.  Stable.

## 2016-12-19 DIAGNOSIS — M9903 Segmental and somatic dysfunction of lumbar region: Secondary | ICD-10-CM | POA: Diagnosis not present

## 2016-12-19 DIAGNOSIS — M9901 Segmental and somatic dysfunction of cervical region: Secondary | ICD-10-CM | POA: Diagnosis not present

## 2016-12-19 DIAGNOSIS — M5412 Radiculopathy, cervical region: Secondary | ICD-10-CM | POA: Diagnosis not present

## 2016-12-19 DIAGNOSIS — M5033 Other cervical disc degeneration, cervicothoracic region: Secondary | ICD-10-CM | POA: Diagnosis not present

## 2016-12-20 NOTE — Telephone Encounter (Signed)
Hard copy mailed  

## 2017-01-04 ENCOUNTER — Other Ambulatory Visit: Payer: Self-pay

## 2017-01-04 MED ORDER — ROSUVASTATIN CALCIUM 5 MG PO TABS: 5.0000 mg | ORAL_TABLET | Freq: Every day | ORAL | 1 refills | Status: DC

## 2017-01-05 ENCOUNTER — Other Ambulatory Visit: Payer: Self-pay | Admitting: *Deleted

## 2017-01-05 MED ORDER — OMEPRAZOLE 20 MG PO CPDR: 20.0000 mg | DELAYED_RELEASE_CAPSULE | Freq: Every day | ORAL | 1 refills | Status: DC

## 2017-01-16 DIAGNOSIS — M9903 Segmental and somatic dysfunction of lumbar region: Secondary | ICD-10-CM | POA: Diagnosis not present

## 2017-01-16 DIAGNOSIS — M5033 Other cervical disc degeneration, cervicothoracic region: Secondary | ICD-10-CM | POA: Diagnosis not present

## 2017-01-16 DIAGNOSIS — M5412 Radiculopathy, cervical region: Secondary | ICD-10-CM | POA: Diagnosis not present

## 2017-01-16 DIAGNOSIS — M9901 Segmental and somatic dysfunction of cervical region: Secondary | ICD-10-CM | POA: Diagnosis not present

## 2017-01-20 DIAGNOSIS — M7541 Impingement syndrome of right shoulder: Secondary | ICD-10-CM | POA: Diagnosis not present

## 2017-02-02 DIAGNOSIS — M7541 Impingement syndrome of right shoulder: Secondary | ICD-10-CM | POA: Diagnosis not present

## 2017-02-06 ENCOUNTER — Ambulatory Visit: Payer: PPO | Admitting: Internal Medicine

## 2017-02-15 ENCOUNTER — Ambulatory Visit
Admission: RE | Admit: 2017-02-15 | Discharge: 2017-02-15 | Disposition: A | Discharge status: Home / Self Care | Payer: PPO | Source: Ambulatory Visit | Attending: Internal Medicine | Admitting: Internal Medicine

## 2017-02-15 DIAGNOSIS — Z1231 Encounter for screening mammogram for malignant neoplasm of breast: Secondary | ICD-10-CM | POA: Insufficient documentation

## 2017-02-15 DIAGNOSIS — Z1239 Encounter for other screening for malignant neoplasm of breast: Secondary | ICD-10-CM

## 2017-02-17 DIAGNOSIS — M50821 Other cervical disc disorders at C4-C5 level: Secondary | ICD-10-CM | POA: Diagnosis not present

## 2017-02-17 DIAGNOSIS — M5412 Radiculopathy, cervical region: Secondary | ICD-10-CM | POA: Diagnosis not present

## 2017-02-17 DIAGNOSIS — M50822 Other cervical disc disorders at C5-C6 level: Secondary | ICD-10-CM | POA: Diagnosis not present

## 2017-02-17 DIAGNOSIS — M50823 Other cervical disc disorders at C6-C7 level: Secondary | ICD-10-CM | POA: Diagnosis not present

## 2017-03-06 DIAGNOSIS — M5412 Radiculopathy, cervical region: Secondary | ICD-10-CM | POA: Diagnosis not present

## 2017-03-06 DIAGNOSIS — M5033 Other cervical disc degeneration, cervicothoracic region: Secondary | ICD-10-CM | POA: Diagnosis not present

## 2017-03-06 DIAGNOSIS — M9901 Segmental and somatic dysfunction of cervical region: Secondary | ICD-10-CM | POA: Diagnosis not present

## 2017-03-06 DIAGNOSIS — M9903 Segmental and somatic dysfunction of lumbar region: Secondary | ICD-10-CM | POA: Diagnosis not present

## 2017-03-07 ENCOUNTER — Encounter: Payer: Self-pay | Admitting: Internal Medicine

## 2017-03-07 ENCOUNTER — Ambulatory Visit (INDEPENDENT_AMBULATORY_CARE_PROVIDER_SITE_OTHER): Payer: PPO | Admitting: Internal Medicine

## 2017-03-07 VITALS — BP 120/78 | HR 84 | Temp 98.1°F | Ht 64.17 in | Wt 145.4 lb

## 2017-03-07 DIAGNOSIS — F439 Reaction to severe stress, unspecified: Secondary | ICD-10-CM

## 2017-03-07 DIAGNOSIS — M25511 Pain in right shoulder: Secondary | ICD-10-CM | POA: Diagnosis not present

## 2017-03-07 DIAGNOSIS — Z23 Encounter for immunization: Secondary | ICD-10-CM | POA: Diagnosis not present

## 2017-03-07 DIAGNOSIS — E039 Hypothyroidism, unspecified: Secondary | ICD-10-CM

## 2017-03-07 DIAGNOSIS — K2100 Gastro-esophageal reflux disease with esophagitis, without bleeding: Secondary | ICD-10-CM

## 2017-03-07 DIAGNOSIS — E7800 Pure hypercholesterolemia, unspecified: Secondary | ICD-10-CM

## 2017-03-07 NOTE — Progress Notes (Signed)
Patient ID: [name], female   DOB: 1946/04/12, 71 y.o.   MRN: [id number]   Subjective:    Patient ID: [name], female    DOB: [birth year], 71 y.o.   MRN: [id number]  HPI  Patient here for a scheduled follow up.  She overall feels she is doing relatively well.  Still trying to move and clean out her husband's house.  Having some right shoulder discomfort.  S/p injection.  Doing better.  Has seen chiropractor.  Adjustment for back/hip.  Was also having some neck pain.  Evaluated.  Planning for PT.  Overall feeling better.  No chest pain.  No sob.  No acid reflux.  No abdominal pain.  Bowels doing better.     Past Medical History:  Diagnosis Date  . Arthritis   . Environmental allergies   . Esophagitis    [place], followed by Dr Vennie Homans  . GERD (gastroesophageal reflux disease)   . Hypercholesterolemia   . Hypothyroidism    Past Surgical History:  Procedure Laterality Date  . [place]  . KNEE ARTHROSCOPY  12/03/07   left  . KNEE ARTHROSCOPY  06/25/08   left  . NASAL SINUS SURGERY  12/06  . REPLACEMENT TOTAL KNEE  01/04/09   left  . RHINOPLASTY  1986  . VAGINAL HYSTERECTOMY  1980   ovaries left in place  . WRIST SURGERY  1970   cyst removal   Family History  Problem Relation Age of Onset  . Heart disease Father   . Hyperlipidemia Paternal Grandmother   . Hyperlipidemia Paternal Grandfather   . Prostate cancer Maternal Uncle   . Pancreatic cancer Cousin   . Multiple myeloma Brother   . Breast cancer Maternal Aunt   . Breast cancer Maternal Aunt    Social History   Social History  . Marital status: Widowed    Spouse name: N/A  . Number of children: N/A  . Years of education: N/A   Social History Main Topics  . Smoking status: Former Smoker    Years: 5.00    Types: Cigarettes  . Smokeless tobacco: Never Used  . Alcohol use No  . Drug use: No  . Sexual activity: Yes   Other Topics Concern  . None   Social History Narrative  . None     Outpatient Encounter Prescriptions as of 03/07/2017  Medication Sig  . acetaminophen (TYLENOL) 500 MG tablet Take 500 mg by mouth every 6 (six) hours as needed.  . Ascorbic Acid (VITAMIN C) 1000 MG tablet Take 1,000 mg by mouth daily.  Marland Kitchen BIOTIN 5000 PO Take 1 tablet by mouth daily.  . busPIRone (BUSPAR) 5 MG tablet Take 1 tablet (5 mg total) by mouth 2 (two) times daily. (Patient taking differently: Take 5 mg by mouth as needed. )  . Cholecalciferol (VITAMIN D PO) Take 5,000 Units by mouth daily.  . Coenzyme Q10 60 MG TABS Take 120 mg by mouth daily.  . diclofenac sodium (VOLTAREN) 1 % GEL Apply 1 application topically as needed.  Marland Kitchen esomeprazole (NEXIUM) 40 MG capsule Take 40 mg by mouth daily as needed.   . Hypertonic Nasal Wash (SINUS RINSE NA) Place into the nose as needed.  Marland Kitchen levothyroxine (LEVOTHROID) 75 MCG tablet Take 1 tablet (75 mcg total) by mouth every other day.  . levothyroxine (SYNTHROID, LEVOTHROID) 50 MCG tablet Take 1 tablet (50 mcg total) by mouth every other day.  . Multiple Vitamin (MULTIVITAMIN) LIQD Take 5  mLs by mouth daily.  . mupirocin ointment (BACTROBAN) 2 % Place 1 application into the nose 2 (two) times daily.  . Omega-3 Fatty Acids (FISH OIL) 1360 MG CAPS Take by mouth daily.  Marland Kitchen omeprazole (PRILOSEC) 20 MG capsule Take 1 capsule (20 mg total) by mouth daily.  . Probiotic Product (PROBIOTIC DAILY PO) Take 1 capsule by mouth.  . rosuvastatin (CRESTOR) 5 MG tablet Take 1 tablet (5 mg total) by mouth daily.  . TOLAK 4 % CREA Apply daily from knees down  . vitamin E 400 UNIT capsule Take 400 Units by mouth daily.  . [DISCONTINUED] amoxicillin (AMOXIL) 875 MG tablet Take 1 tablet (875 mg total) by mouth 2 (two) times daily.  . [DISCONTINUED] meloxicam (MOBIC) 7.5 MG tablet TAKE 1 TABLET (7.5 MG TOTAL) BY MOUTH DAILY.  . [DISCONTINUED] nystatin cream (MYCOSTATIN) Apply 1 application topically 2 (two) times daily.  . [DISCONTINUED] triamcinolone (KENALOG) 0.025 %  ointment Apply 1 application topically 2 (two) times daily.   No facility-administered encounter medications on file as of 03/07/2017.     Review of Systems  Constitutional: Negative for appetite change and unexpected weight change.  HENT: Negative for congestion and sinus pressure.   Respiratory: Negative for cough, chest tightness and shortness of breath.   Cardiovascular: Negative for chest pain, palpitations and leg swelling.  Gastrointestinal: Negative for abdominal pain, diarrhea, nausea and vomiting.  Genitourinary: Negative for difficulty urinating and dysuria.  Musculoskeletal:       Has been having right shoulder pain and back/hip pain.  Better. Seeing ortho.    Skin: Negative for color change and rash.  Neurological: Negative for dizziness, light-headedness and headaches.  Psychiatric/Behavioral: Negative for agitation and dysphoric mood.       Doing well on buspar.         Objective:    Physical Exam  Constitutional: She appears well-developed and well-nourished. No distress.  HENT:  Nose: Nose normal.  Mouth/Throat: Oropharynx is clear and moist.  Neck: Neck supple. No thyromegaly present.  Cardiovascular: Normal rate and regular rhythm.   Pulmonary/Chest: Breath sounds normal. No respiratory distress. She has no wheezes.  Abdominal: Soft. Bowel sounds are normal. There is no tenderness.  Musculoskeletal: She exhibits no edema or tenderness.  Lymphadenopathy:    She has no cervical adenopathy.  Skin: No rash noted. No erythema.  Psychiatric: She has a normal mood and affect. Her behavior is normal.    BP 120/78 (BP Location: Left Arm, Patient Position: Sitting, Cuff Size: Normal)   Pulse 84   Temp 98.1 F (36.7 C) (Oral)   Ht 5' 4.17" (1.63 m)   Wt 145 lb 6.4 oz (66 kg)   SpO2 98%   BMI 24.82 kg/m  Wt Readings from Last 3 Encounters:  03/07/17 145 lb 6.4 oz (66 kg)  12/16/16 147 lb 6.4 oz (66.9 kg)  11/15/16 148 lb 3.2 oz (67.2 kg)     Lab Results    Component Value Date   WBC 5.3 12/16/2016   HGB 12.9 12/16/2016   HCT 37.7 12/16/2016   PLT 243.0 12/16/2016   GLUCOSE 115 (H) 12/16/2016   CHOL 199 09/06/2016   TRIG 164.0 (H) 09/06/2016   HDL 63.50 09/06/2016   LDLDIRECT 123.4 02/27/2013   LDLCALC 103 (H) 09/06/2016   ALT 15 09/06/2016   AST 17 09/06/2016   NA 140 12/16/2016   K 4.2 12/16/2016   CL 104 12/16/2016   CREATININE 0.72 12/16/2016   BUN 16 12/16/2016  CO2 30 12/16/2016   TSH 0.49 11/11/2016    Mm Screening Breast Tomo Bilateral  Result Date: 02/16/2017 CLINICAL DATA:  Screening. EXAM: 2D DIGITAL SCREENING BILATERAL MAMMOGRAM WITH CAD AND ADJUNCT TOMO COMPARISON:  Previous exam(s). ACR Breast Density Category c: The breast tissue is heterogeneously dense, which may obscure small masses. FINDINGS: There are no findings suspicious for malignancy. Images were processed with CAD. IMPRESSION: No mammographic evidence of malignancy. A result letter of this screening mammogram will be mailed directly to the patient. RECOMMENDATION: Screening mammogram in one year. (Code:SM-B-01Y) BI-RADS CATEGORY  1: Negative. Electronically Signed   By: Pamelia Hoit M.D.   On: 02/16/2017 08:06       Assessment & Plan:   Problem List Items Addressed This Visit    GERD (gastroesophageal reflux disease)    Controlled on current regimen.        Hypercholesteremia    On crestor.  Low cholesterol diet and exercise.  Follow lipid panel and liver function tests.        Hypothyroidism    On thyroid replacement.  Follow tsh.        Stress    Taking buspar and doing better.  Follow.         Other Visit Diagnoses    Right shoulder pain, unspecified chronicity    -  Primary   S/p injection.  better.  seeing ortho.     Encounter for immunization       Relevant Orders   Flu vaccine HIGH DOSE PF (Completed)       Einar Pheasant, MD

## 2017-03-10 ENCOUNTER — Encounter: Payer: Self-pay | Admitting: Internal Medicine

## 2017-03-10 NOTE — Assessment & Plan Note (Signed)
On thyroid replacement.  Follow tsh.  

## 2017-03-10 NOTE — Assessment & Plan Note (Signed)
Controlled on current regimen.   

## 2017-03-10 NOTE — Assessment & Plan Note (Signed)
On crestor.  Low cholesterol diet and exercise.  Follow lipid panel and liver function tests.   

## 2017-03-10 NOTE — Assessment & Plan Note (Signed)
Taking buspar and doing better.  Follow.

## 2017-03-17 ENCOUNTER — Telehealth: Payer: Self-pay | Admitting: Internal Medicine

## 2017-03-17 ENCOUNTER — Other Ambulatory Visit: Payer: PPO

## 2017-03-17 ENCOUNTER — Other Ambulatory Visit (INDEPENDENT_AMBULATORY_CARE_PROVIDER_SITE_OTHER): Payer: PPO

## 2017-03-17 DIAGNOSIS — R739 Hyperglycemia, unspecified: Secondary | ICD-10-CM | POA: Diagnosis not present

## 2017-03-17 DIAGNOSIS — E7800 Pure hypercholesterolemia, unspecified: Secondary | ICD-10-CM | POA: Diagnosis not present

## 2017-03-17 LAB — LIPID PANEL
Cholesterol: 212 mg/dL — ABNORMAL HIGH (ref 0–200)
HDL: 67.7 mg/dL
LDL Cholesterol: 119 mg/dL — ABNORMAL HIGH (ref 0–99)
NonHDL: 144.45
Total CHOL/HDL Ratio: 3
Triglycerides: 126 mg/dL (ref 0.0–149.0)
VLDL: 25.2 mg/dL (ref 0.0–40.0)

## 2017-03-17 LAB — HEPATIC FUNCTION PANEL
ALT: 13 U/L (ref 0–35)
AST: 12 U/L (ref 0–37)
Albumin: 4.4 g/dL (ref 3.5–5.2)
Alkaline Phosphatase: 59 U/L (ref 39–117)
Bilirubin, Direct: 0.1 mg/dL (ref 0.0–0.3)
Total Bilirubin: 0.5 mg/dL (ref 0.2–1.2)
Total Protein: 6.8 g/dL (ref 6.0–8.3)

## 2017-03-17 LAB — HEMOGLOBIN A1C: Hgb A1c MFr Bld: 5.8 % (ref 4.6–6.5)

## 2017-03-17 MED ORDER — FLUTICASONE PROPIONATE 50 MCG/ACT NA SUSP: 2.0000 | Freq: Every day | NASAL | 6 refills | Status: DC

## 2017-03-17 NOTE — Telephone Encounter (Signed)
Patient called she was using husbands Flonase would like script called into mail order.

## 2017-03-17 NOTE — Telephone Encounter (Signed)
Pt called requesting a refill on her Flonase for allergies. Please call completed.Please advise, thank you!  [place], [place]  Call pt @ [phone]

## 2017-03-17 NOTE — Telephone Encounter (Signed)
flonase rx sent in with refills.

## 2017-03-19 ENCOUNTER — Encounter: Payer: Self-pay | Admitting: Internal Medicine

## 2017-03-24 ENCOUNTER — Encounter: Payer: PPO | Admitting: Internal Medicine

## 2017-03-27 DIAGNOSIS — M9903 Segmental and somatic dysfunction of lumbar region: Secondary | ICD-10-CM | POA: Diagnosis not present

## 2017-03-27 DIAGNOSIS — M5033 Other cervical disc degeneration, cervicothoracic region: Secondary | ICD-10-CM | POA: Diagnosis not present

## 2017-03-27 DIAGNOSIS — M5412 Radiculopathy, cervical region: Secondary | ICD-10-CM | POA: Diagnosis not present

## 2017-03-27 DIAGNOSIS — M9901 Segmental and somatic dysfunction of cervical region: Secondary | ICD-10-CM | POA: Diagnosis not present

## 2017-03-27 NOTE — Telephone Encounter (Signed)
Hard copy mailed  

## 2017-04-18 DIAGNOSIS — M5033 Other cervical disc degeneration, cervicothoracic region: Secondary | ICD-10-CM | POA: Diagnosis not present

## 2017-04-18 DIAGNOSIS — M9901 Segmental and somatic dysfunction of cervical region: Secondary | ICD-10-CM | POA: Diagnosis not present

## 2017-04-18 DIAGNOSIS — M9903 Segmental and somatic dysfunction of lumbar region: Secondary | ICD-10-CM | POA: Diagnosis not present

## 2017-04-18 DIAGNOSIS — M5412 Radiculopathy, cervical region: Secondary | ICD-10-CM | POA: Diagnosis not present

## 2017-05-01 DIAGNOSIS — M9903 Segmental and somatic dysfunction of lumbar region: Secondary | ICD-10-CM | POA: Diagnosis not present

## 2017-05-01 DIAGNOSIS — M5412 Radiculopathy, cervical region: Secondary | ICD-10-CM | POA: Diagnosis not present

## 2017-05-01 DIAGNOSIS — M9901 Segmental and somatic dysfunction of cervical region: Secondary | ICD-10-CM | POA: Diagnosis not present

## 2017-05-01 DIAGNOSIS — M5033 Other cervical disc degeneration, cervicothoracic region: Secondary | ICD-10-CM | POA: Diagnosis not present

## 2017-05-09 DIAGNOSIS — M9901 Segmental and somatic dysfunction of cervical region: Secondary | ICD-10-CM | POA: Diagnosis not present

## 2017-05-09 DIAGNOSIS — M9903 Segmental and somatic dysfunction of lumbar region: Secondary | ICD-10-CM | POA: Diagnosis not present

## 2017-05-09 DIAGNOSIS — M5033 Other cervical disc degeneration, cervicothoracic region: Secondary | ICD-10-CM | POA: Diagnosis not present

## 2017-05-09 DIAGNOSIS — M5412 Radiculopathy, cervical region: Secondary | ICD-10-CM | POA: Diagnosis not present

## 2017-05-15 DIAGNOSIS — M5033 Other cervical disc degeneration, cervicothoracic region: Secondary | ICD-10-CM | POA: Diagnosis not present

## 2017-05-15 DIAGNOSIS — M9901 Segmental and somatic dysfunction of cervical region: Secondary | ICD-10-CM | POA: Diagnosis not present

## 2017-05-15 DIAGNOSIS — M5412 Radiculopathy, cervical region: Secondary | ICD-10-CM | POA: Diagnosis not present

## 2017-05-15 DIAGNOSIS — M9903 Segmental and somatic dysfunction of lumbar region: Secondary | ICD-10-CM | POA: Diagnosis not present

## 2017-05-30 DIAGNOSIS — M5033 Other cervical disc degeneration, cervicothoracic region: Secondary | ICD-10-CM | POA: Diagnosis not present

## 2017-05-30 DIAGNOSIS — M9901 Segmental and somatic dysfunction of cervical region: Secondary | ICD-10-CM | POA: Diagnosis not present

## 2017-05-30 DIAGNOSIS — M9903 Segmental and somatic dysfunction of lumbar region: Secondary | ICD-10-CM | POA: Diagnosis not present

## 2017-05-30 DIAGNOSIS — M5412 Radiculopathy, cervical region: Secondary | ICD-10-CM | POA: Diagnosis not present

## 2017-06-14 DIAGNOSIS — M5033 Other cervical disc degeneration, cervicothoracic region: Secondary | ICD-10-CM | POA: Diagnosis not present

## 2017-06-14 DIAGNOSIS — M9901 Segmental and somatic dysfunction of cervical region: Secondary | ICD-10-CM | POA: Diagnosis not present

## 2017-06-14 DIAGNOSIS — M5412 Radiculopathy, cervical region: Secondary | ICD-10-CM | POA: Diagnosis not present

## 2017-06-14 DIAGNOSIS — M9903 Segmental and somatic dysfunction of lumbar region: Secondary | ICD-10-CM | POA: Diagnosis not present

## 2017-06-19 ENCOUNTER — Encounter: Payer: Self-pay | Admitting: Internal Medicine

## 2017-06-19 ENCOUNTER — Ambulatory Visit (INDEPENDENT_AMBULATORY_CARE_PROVIDER_SITE_OTHER): Payer: Medicare HMO | Admitting: Internal Medicine

## 2017-06-19 VITALS — BP 130/70 | HR 64 | Temp 97.7°F | Ht 64.0 in | Wt 148.1 lb

## 2017-06-19 DIAGNOSIS — F439 Reaction to severe stress, unspecified: Secondary | ICD-10-CM

## 2017-06-19 DIAGNOSIS — R739 Hyperglycemia, unspecified: Secondary | ICD-10-CM

## 2017-06-19 DIAGNOSIS — E7800 Pure hypercholesterolemia, unspecified: Secondary | ICD-10-CM

## 2017-06-19 DIAGNOSIS — K2100 Gastro-esophageal reflux disease with esophagitis, without bleeding: Secondary | ICD-10-CM

## 2017-06-19 DIAGNOSIS — Z0000 Encounter for general adult medical examination without abnormal findings: Secondary | ICD-10-CM | POA: Diagnosis not present

## 2017-06-19 DIAGNOSIS — E039 Hypothyroidism, unspecified: Secondary | ICD-10-CM | POA: Diagnosis not present

## 2017-06-19 DIAGNOSIS — R69 Illness, unspecified: Secondary | ICD-10-CM | POA: Diagnosis not present

## 2017-06-19 MED ORDER — OMEPRAZOLE 20 MG PO CPDR: 20.0000 mg | DELAYED_RELEASE_CAPSULE | Freq: Every day | ORAL | 1 refills | Status: DC

## 2017-06-19 NOTE — Progress Notes (Signed)
Patient ID: [name], female   DOB: 20-Jun-1945, 72 y.o.   MRN: [id number]   Subjective:    Patient ID: [name], female    DOB: 20-Oct-1945, 72 y.o.   MRN: [id number]  HPI  Patient here for her physical exam.  She reports she is doing better.  Feels better.  Stress is better.  Tries to stay active.  No chest pain.  No sob.  No acid reflux.  No abdominal pain.  Bowels moving.  Left thumb pain.  Has been evaluated by Danise Mina.  Splint helps.  Rarely takes meloxicam.  Acid reflux controlled on omeprazole.  Sees GI.  Overall she feels she is doing well.     Past Medical History:  Diagnosis Date  . Arthritis   . Environmental allergies   . Esophagitis    Barrets, followed by Dr Vennie Homans  . GERD (gastroesophageal reflux disease)   . Hypercholesterolemia   . Hypothyroidism    Past Surgical History:  Procedure Laterality Date  . [place]  . KNEE ARTHROSCOPY  12/03/07   left  . KNEE ARTHROSCOPY  06/25/08   left  . NASAL SINUS SURGERY  12/06  . REPLACEMENT TOTAL KNEE  01/04/09   left  . RHINOPLASTY  1986  . VAGINAL HYSTERECTOMY  1980   ovaries left in place  . WRIST SURGERY  1970   cyst removal   Family History  Problem Relation Age of Onset  . Heart disease Father   . Hyperlipidemia Paternal Grandmother   . Hyperlipidemia Paternal Grandfather   . Prostate cancer Maternal Uncle   . Pancreatic cancer Cousin   . Multiple myeloma Brother   . Breast cancer Maternal Aunt   . Breast cancer Maternal Aunt    Social History   Socioeconomic History  . Marital status: Widowed    Spouse name: None  . Number of children: None  . Years of education: None  . Highest education level: None  Social Needs  . Financial resource strain: None  . Food insecurity - worry: None  . Food insecurity - inability: None  . Transportation needs - medical: None  . Transportation needs - non-medical: None  Occupational History  . None  Tobacco Use  . Smoking status: Former  Smoker    Years: 5.00    Types: Cigarettes  . Smokeless tobacco: Never Used  Substance and Sexual Activity  . Alcohol use: No    Alcohol/week: 0.0 oz  . Drug use: No  . Sexual activity: Yes  Other Topics Concern  . None  Social History Narrative  . None    Outpatient Encounter Medications as of 06/19/2017  Medication Sig  . acetaminophen (TYLENOL) 500 MG tablet Take 500 mg by mouth every 6 (six) hours as needed.  . Ascorbic Acid (VITAMIN C) 1000 MG tablet Take 1,000 mg by mouth daily.  Marland Kitchen BIOTIN 5000 PO Take 1 tablet by mouth daily.  . busPIRone (BUSPAR) 5 MG tablet Take 1 tablet (5 mg total) by mouth 2 (two) times daily. (Patient taking differently: Take 5 mg by mouth as needed. )  . Cholecalciferol (VITAMIN D PO) Take 5,000 Units by mouth daily.  . Coenzyme Q10 60 MG TABS Take 120 mg by mouth daily.  . diclofenac sodium (VOLTAREN) 1 % GEL Apply 1 application topically as needed.  Marland Kitchen esomeprazole (NEXIUM) 40 MG capsule Take 40 mg by mouth daily as needed.   . fluticasone (FLONASE) 50 MCG/ACT nasal spray Place  2 sprays into both nostrils daily.  . Hypertonic Nasal Wash (SINUS RINSE NA) Place into the nose as needed.  Marland Kitchen levothyroxine (LEVOTHROID) 75 MCG tablet Take 1 tablet (75 mcg total) by mouth every other day.  . levothyroxine (SYNTHROID, LEVOTHROID) 50 MCG tablet Take 1 tablet (50 mcg total) by mouth every other day.  . Multiple Vitamin (MULTIVITAMIN) LIQD Take 5 mLs by mouth daily.  . mupirocin ointment (BACTROBAN) 2 % Place 1 application into the nose 2 (two) times daily.  . Omega-3 Fatty Acids (FISH OIL) 1360 MG CAPS Take by mouth daily.  Marland Kitchen omeprazole (PRILOSEC) 20 MG capsule Take 1 capsule (20 mg total) by mouth daily.  . Probiotic Product (PROBIOTIC DAILY PO) Take 1 capsule by mouth.  . rosuvastatin (CRESTOR) 5 MG tablet Take 1 tablet (5 mg total) by mouth daily.  . TOLAK 4 % CREA Apply daily from knees down  . vitamin E 400 UNIT capsule Take 400 Units by mouth daily.  .  [DISCONTINUED] levothyroxine (LEVOTHROID) 75 MCG tablet Take 1 tablet (75 mcg total) by mouth every other day.  . [DISCONTINUED] levothyroxine (SYNTHROID, LEVOTHROID) 50 MCG tablet Take 1 tablet (50 mcg total) by mouth every other day.  . [DISCONTINUED] omeprazole (PRILOSEC) 20 MG capsule Take 1 capsule (20 mg total) by mouth daily.   No facility-administered encounter medications on file as of 06/19/2017.     Review of Systems  Constitutional: Negative for appetite change and unexpected weight change.  HENT: Negative for congestion and sinus pressure.   Eyes: Negative for pain and visual disturbance.  Respiratory: Negative for cough, chest tightness and shortness of breath.   Cardiovascular: Negative for chest pain, palpitations and leg swelling.  Gastrointestinal: Negative for abdominal pain, diarrhea, nausea and vomiting.  Genitourinary: Negative for difficulty urinating and dysuria.  Musculoskeletal: Negative for back pain and joint swelling.       Left thumb - better with splint.    Skin: Negative for color change and rash.  Neurological: Negative for dizziness, light-headedness and headaches.  Hematological: Negative for adenopathy. Does not bruise/bleed easily.  Psychiatric/Behavioral: Negative for agitation and dysphoric mood.       Objective:    Physical Exam  Constitutional: She is oriented to person, place, and time. She appears well-developed and well-nourished. No distress.  HENT:  Nose: Nose normal.  Mouth/Throat: Oropharynx is clear and moist.  Eyes: Right eye exhibits no discharge. Left eye exhibits no discharge. No scleral icterus.  Neck: Neck supple. No thyromegaly present.  Cardiovascular: Normal rate and regular rhythm.  Pulmonary/Chest: Breath sounds normal. No accessory muscle usage. No tachypnea. No respiratory distress. She has no decreased breath sounds. She has no wheezes. She has no rhonchi. Right breast exhibits no inverted nipple, no mass, no nipple  discharge and no tenderness (no axillary adenopathy). Left breast exhibits no inverted nipple, no mass, no nipple discharge and no tenderness (no axilarry adenopathy).  Abdominal: Soft. Bowel sounds are normal. There is no tenderness.  Musculoskeletal: She exhibits no edema or tenderness.  Lymphadenopathy:    She has no cervical adenopathy.  Neurological: She is alert and oriented to person, place, and time.  Skin: Skin is warm. No rash noted. No erythema.  Psychiatric: She has a normal mood and affect. Her behavior is normal.    BP 130/70   Pulse 64   Temp 97.7 F (36.5 C) (Oral)   Ht 5' 4" (1.626 m)   Wt 148 lb 1.9 oz (67.2 kg)   SpO2  96%   BMI 25.42 kg/m  Wt Readings from Last 3 Encounters:  06/19/17 148 lb 1.9 oz (67.2 kg)  03/07/17 145 lb 6.4 oz (66 kg)  12/16/16 147 lb 6.4 oz (66.9 kg)     Lab Results  Component Value Date   WBC 5.3 12/16/2016   HGB 12.9 12/16/2016   HCT 37.7 12/16/2016   PLT 243.0 12/16/2016   GLUCOSE 115 (H) 12/16/2016   CHOL 212 (H) 03/17/2017   TRIG 126.0 03/17/2017   HDL 67.70 03/17/2017   LDLDIRECT 123.4 02/27/2013   LDLCALC 119 (H) 03/17/2017   ALT 13 03/17/2017   AST 12 03/17/2017   NA 140 12/16/2016   K 4.2 12/16/2016   CL 104 12/16/2016   CREATININE 0.72 12/16/2016   BUN 16 12/16/2016   CO2 30 12/16/2016   TSH 0.49 11/11/2016   HGBA1C 5.8 03/17/2017    Mm Screening Breast Tomo Bilateral  Result Date: 02/16/2017 CLINICAL DATA:  Screening. EXAM: 2D DIGITAL SCREENING BILATERAL MAMMOGRAM WITH CAD AND ADJUNCT TOMO COMPARISON:  Previous exam(s). ACR Breast Density Category c: The breast tissue is heterogeneously dense, which may obscure small masses. FINDINGS: There are no findings suspicious for malignancy. Images were processed with CAD. IMPRESSION: No mammographic evidence of malignancy. A result letter of this screening mammogram will be mailed directly to the patient. RECOMMENDATION: Screening mammogram in one year. (Code:SM-B-01Y)  BI-RADS CATEGORY  1: Negative. Electronically Signed   By: Pamelia Hoit M.D.   On: 02/16/2017 08:06       Assessment & Plan:   Problem List Items Addressed This Visit    GERD (gastroesophageal reflux disease)    Controlled on current regimen.  Follow.  Followed by GI.       Relevant Medications   omeprazole (PRILOSEC) 20 MG capsule   Health care maintenance    Physical today 06/19/17.  Mammogram 02/16/17 - Birads I.  colonosocpy 04/2013.  Recommended f/u colonoscopy in 5 years.  Has f/u planned with her gastroenterologist.        Hypercholesteremia    On crestor.  Low cholesterol diet and exercise.  Follow lipid panel and liver function tests.        Relevant Orders   Lipid panel   Hepatic function panel   Basic metabolic panel   Hypothyroidism    On thyroid replacement.  Follow tsh.       Relevant Medications   levothyroxine (LEVOTHROID) 75 MCG tablet   levothyroxine (SYNTHROID, LEVOTHROID) 50 MCG tablet   Other Relevant Orders   TSH   Stress    Doing better.  Has not required buspar recently.  Follow.        Other Visit Diagnoses    Routine general medical examination at a health care facility    -  Primary   Hyperglycemia       Relevant Orders   Hemoglobin A1c       Einar Pheasant, MD

## 2017-06-19 NOTE — Progress Notes (Signed)
Pre visit review using our clinic review tool, if applicable. No additional management support is needed unless otherwise documented below in the visit note. 

## 2017-06-19 NOTE — Assessment & Plan Note (Signed)
Physical today 06/19/17.  Mammogram 02/16/17 - Birads I.  colonosocpy 04/2013.  Recommended f/u colonoscopy in 5 years.  Has f/u planned with her gastroenterologist.

## 2017-06-20 ENCOUNTER — Encounter: Payer: Self-pay | Admitting: Internal Medicine

## 2017-06-20 MED ORDER — LEVOTHYROXINE SODIUM 75 MCG PO TABS: 75.0000 ug | ORAL_TABLET | ORAL | 1 refills | Status: DC

## 2017-06-20 MED ORDER — LEVOTHYROXINE SODIUM 50 MCG PO TABS: 50.0000 ug | ORAL_TABLET | ORAL | 1 refills | Status: DC

## 2017-06-20 NOTE — Assessment & Plan Note (Signed)
Controlled on current regimen.  Follow.  Followed by GI.

## 2017-06-20 NOTE — Assessment & Plan Note (Signed)
On thyroid replacement.  Follow tsh.  

## 2017-06-20 NOTE — Assessment & Plan Note (Signed)
Doing better.  Has not required buspar recently.  Follow.

## 2017-06-20 NOTE — Assessment & Plan Note (Signed)
On crestor.  Low cholesterol diet and exercise.  Follow lipid panel and liver function tests.   

## 2017-06-21 ENCOUNTER — Ambulatory Visit: Payer: Self-pay | Admitting: *Deleted

## 2017-06-21 NOTE — Telephone Encounter (Signed)
Will route this request to [facility] for final dispositon.

## 2017-06-28 DIAGNOSIS — M5412 Radiculopathy, cervical region: Secondary | ICD-10-CM | POA: Diagnosis not present

## 2017-06-28 DIAGNOSIS — M9901 Segmental and somatic dysfunction of cervical region: Secondary | ICD-10-CM | POA: Diagnosis not present

## 2017-06-28 DIAGNOSIS — L239 Allergic contact dermatitis, unspecified cause: Secondary | ICD-10-CM | POA: Diagnosis not present

## 2017-06-28 DIAGNOSIS — M9903 Segmental and somatic dysfunction of lumbar region: Secondary | ICD-10-CM | POA: Diagnosis not present

## 2017-06-28 DIAGNOSIS — M5033 Other cervical disc degeneration, cervicothoracic region: Secondary | ICD-10-CM | POA: Diagnosis not present

## 2017-06-29 DIAGNOSIS — M5412 Radiculopathy, cervical region: Secondary | ICD-10-CM | POA: Diagnosis not present

## 2017-06-29 DIAGNOSIS — M7541 Impingement syndrome of right shoulder: Secondary | ICD-10-CM | POA: Diagnosis not present

## 2017-07-11 DIAGNOSIS — M7541 Impingement syndrome of right shoulder: Secondary | ICD-10-CM | POA: Diagnosis not present

## 2017-07-17 DIAGNOSIS — R69 Illness, unspecified: Secondary | ICD-10-CM | POA: Diagnosis not present

## 2017-07-18 DIAGNOSIS — H5203 Hypermetropia, bilateral: Secondary | ICD-10-CM | POA: Diagnosis not present

## 2017-07-18 DIAGNOSIS — Z0100 Encounter for examination of eyes and vision without abnormal findings: Secondary | ICD-10-CM | POA: Diagnosis not present

## 2017-07-25 DIAGNOSIS — M5033 Other cervical disc degeneration, cervicothoracic region: Secondary | ICD-10-CM | POA: Diagnosis not present

## 2017-07-25 DIAGNOSIS — Z0101 Encounter for examination of eyes and vision with abnormal findings: Secondary | ICD-10-CM | POA: Diagnosis not present

## 2017-07-25 DIAGNOSIS — M5412 Radiculopathy, cervical region: Secondary | ICD-10-CM | POA: Diagnosis not present

## 2017-07-25 DIAGNOSIS — M9903 Segmental and somatic dysfunction of lumbar region: Secondary | ICD-10-CM | POA: Diagnosis not present

## 2017-07-25 DIAGNOSIS — M9901 Segmental and somatic dysfunction of cervical region: Secondary | ICD-10-CM | POA: Diagnosis not present

## 2017-07-29 DIAGNOSIS — E785 Hyperlipidemia, unspecified: Secondary | ICD-10-CM | POA: Diagnosis not present

## 2017-07-29 DIAGNOSIS — S022XXA Fracture of nasal bones, initial encounter for closed fracture: Secondary | ICD-10-CM | POA: Diagnosis not present

## 2017-07-29 DIAGNOSIS — S0181XA Laceration without foreign body of other part of head, initial encounter: Secondary | ICD-10-CM | POA: Diagnosis not present

## 2017-07-29 DIAGNOSIS — S022XXB Fracture of nasal bones, initial encounter for open fracture: Secondary | ICD-10-CM | POA: Diagnosis not present

## 2017-07-29 DIAGNOSIS — E079 Disorder of thyroid, unspecified: Secondary | ICD-10-CM | POA: Diagnosis not present

## 2017-07-29 DIAGNOSIS — W01198A Fall on same level from slipping, tripping and stumbling with subsequent striking against other object, initial encounter: Secondary | ICD-10-CM | POA: Diagnosis not present

## 2017-07-29 DIAGNOSIS — S0992XA Unspecified injury of nose, initial encounter: Secondary | ICD-10-CM | POA: Diagnosis not present

## 2017-08-03 ENCOUNTER — Ambulatory Visit: Payer: PPO

## 2017-09-01 ENCOUNTER — Telehealth: Payer: Self-pay | Admitting: Internal Medicine

## 2017-09-01 MED ORDER — ROSUVASTATIN CALCIUM 5 MG PO TABS: 5.0000 mg | ORAL_TABLET | Freq: Every day | ORAL | 1 refills | Status: DC

## 2017-09-01 NOTE — Telephone Encounter (Signed)
Copied from [place]. Topic: Quick Communication - Rx Refill/Question >> Sep 01, 2017 12:57 PM Scherrie Gerlach wrote: Medication: rosuvastatin (CRESTOR) 5 MG tablet Has the patient contacted their pharmacy? No, this is new mailorder for pt and she has never had filled here with [place], [place] [phone] (Phone) [phone] (Fax)

## 2017-09-11 DIAGNOSIS — M9903 Segmental and somatic dysfunction of lumbar region: Secondary | ICD-10-CM | POA: Diagnosis not present

## 2017-09-11 DIAGNOSIS — M5033 Other cervical disc degeneration, cervicothoracic region: Secondary | ICD-10-CM | POA: Diagnosis not present

## 2017-09-11 DIAGNOSIS — M5412 Radiculopathy, cervical region: Secondary | ICD-10-CM | POA: Diagnosis not present

## 2017-09-11 DIAGNOSIS — M9901 Segmental and somatic dysfunction of cervical region: Secondary | ICD-10-CM | POA: Diagnosis not present

## 2017-09-18 ENCOUNTER — Ambulatory Visit (INDEPENDENT_AMBULATORY_CARE_PROVIDER_SITE_OTHER): Payer: Medicare HMO

## 2017-09-18 VITALS — BP 122/70 | HR 60 | Temp 97.9°F | Resp 12 | Ht 63.5 in | Wt 146.1 lb

## 2017-09-18 DIAGNOSIS — Z0000 Encounter for general adult medical examination without abnormal findings: Secondary | ICD-10-CM | POA: Diagnosis not present

## 2017-09-18 DIAGNOSIS — E7800 Pure hypercholesterolemia, unspecified: Secondary | ICD-10-CM

## 2017-09-18 DIAGNOSIS — R739 Hyperglycemia, unspecified: Secondary | ICD-10-CM | POA: Diagnosis not present

## 2017-09-18 DIAGNOSIS — E039 Hypothyroidism, unspecified: Secondary | ICD-10-CM | POA: Diagnosis not present

## 2017-09-18 LAB — HEMOGLOBIN A1C: Hgb A1c MFr Bld: 5.7 % (ref 4.6–6.5)

## 2017-09-18 LAB — LIPID PANEL
Cholesterol: 180 mg/dL (ref 0–200)
HDL: 60.6 mg/dL
LDL Cholesterol: 96 mg/dL (ref 0–99)
NonHDL: 119.31
Total CHOL/HDL Ratio: 3
Triglycerides: 117 mg/dL (ref 0.0–149.0)
VLDL: 23.4 mg/dL (ref 0.0–40.0)

## 2017-09-18 LAB — BASIC METABOLIC PANEL WITH GFR
BUN: 11 mg/dL (ref 6–23)
CO2: 29 meq/L (ref 19–32)
Calcium: 9.4 mg/dL (ref 8.4–10.5)
Chloride: 105 meq/L (ref 96–112)
Creatinine, Ser: 0.61 mg/dL (ref 0.40–1.20)
GFR: 102.32 mL/min
Glucose, Bld: 85 mg/dL (ref 70–99)
Potassium: 3.9 meq/L (ref 3.5–5.1)
Sodium: 142 meq/L (ref 135–145)

## 2017-09-18 LAB — HEPATIC FUNCTION PANEL
ALT: 18 U/L (ref 0–35)
AST: 19 U/L (ref 0–37)
Albumin: 4.3 g/dL (ref 3.5–5.2)
Alkaline Phosphatase: 61 U/L (ref 39–117)
Bilirubin, Direct: 0.1 mg/dL (ref 0.0–0.3)
Total Bilirubin: 0.4 mg/dL (ref 0.2–1.2)
Total Protein: 7.2 g/dL (ref 6.0–8.3)

## 2017-09-18 LAB — TSH: TSH: 0.63 u[IU]/mL (ref 0.35–4.50)

## 2017-09-18 NOTE — Patient Instructions (Addendum)
  Ms. Ketter , Thank you for taking time to come for your Medicare Wellness Visit. I appreciate your ongoing commitment to your health goals. Please review the following plan we discussed and let me know if I can assist you in the future.   Bring a copy of your [place] and/or Living Will to be scanned into chart.  Have a great day!  These are the goals we discussed: Goals    . Maintain Healthy Lifestyle       Stay hydrated  Low cholesterol foods Exercise       This is a list of the screening recommended for you and due dates:  Health Maintenance  Topic Date Due  . Flu Shot  01/11/2018  . Mammogram  02/15/2018  . Colon Cancer Screening  05/07/2023  . Tetanus Vaccine  11/16/2025  . DEXA scan (bone density measurement)  Completed  .  Hepatitis C: One time screening is recommended by Center for Disease Control  (CDC) for  adults born from [birth year] through 1966.   Completed  . Pneumonia vaccines  Completed

## 2017-09-18 NOTE — Progress Notes (Signed)
Subjective:   [name] is a 72 y.o. female who presents for Medicare Annual (Subsequent) preventive examination.  Review of Systems:  No ROS.  Medicare Wellness Visit. Additional risk factors are reflected in the social history. Cardiac Risk Factors include: advanced age (>52mn, >>82women)     Objective:     Vitals: BP 122/70 (BP Location: Left Arm, Patient Position: Sitting, Cuff Size: Normal)   Pulse 60   Temp 97.9 F (36.6 C) (Oral)   Resp 12   Ht 5' 3.5" (1.613 m)   Wt 146 lb 1.9 oz (66.3 kg)   SpO2 99%   BMI 25.48 kg/m   Body mass index is 25.48 kg/m.  Advanced Directives 09/18/2017 08/02/2016 05/27/2015  Does Patient Have a Medical Advance Directive? Yes Yes Yes  Type of [place] Living [place];Healthcare Power of AFort ThompsonLiving will  Does patient want to make changes to medical advance directive? No - Patient declined No - Patient declined No - Patient declined  Copy of HMasonin Chart? No - copy requested No - copy requested No - copy requested    Tobacco Social History   Tobacco Use  Smoking Status Former Smoker  . Years: 5.00  . Types: Cigarettes  Smokeless Tobacco Never Used     Counseling given: Not Answered   Clinical Intake:  Pre-visit preparation completed: Yes  Pain : No/denies pain     Nutritional Status: BMI 25 -29 Overweight Diabetes: No  How often do you need to have someone help you when you read instructions, pamphlets, or other written materials from your doctor or pharmacy?: 1 - Never  Interpreter Needed?: No     Past Medical History:  Diagnosis Date  . Arthritis   . Environmental allergies   . Esophagitis    Barrets, followed by Dr NVennie Homans . GERD (gastroesophageal reflux disease)   . Hypercholesterolemia   . Hypothyroidism    Past Surgical History:  Procedure Laterality Date  . ANaples . KNEE  ARTHROSCOPY  12/03/07   left  . KNEE ARTHROSCOPY  06/25/08   left  . NASAL SINUS SURGERY  12/06  . REPLACEMENT TOTAL KNEE  01/04/09   left  . RHINOPLASTY  1986  . VAGINAL HYSTERECTOMY  1980   ovaries left in place  . WRIST SURGERY  1970   cyst removal   Family History  Problem Relation Age of Onset  . Heart disease Father        heart attack  . Diabetes Father   . Hyperlipidemia Paternal Grandmother   . Diabetes Paternal Grandmother   . Stroke Paternal Grandmother   . Hyperlipidemia Paternal Grandfather   . Diabetes Paternal Grandfather   . Prostate cancer Maternal Uncle   . Pancreatic cancer Cousin   . Multiple myeloma Brother   . Breast cancer Maternal Aunt   . Breast cancer Maternal Aunt    Social History   Socioeconomic History  . Marital status: Married    Spouse name: Not on file  . Number of children: Not on file  . Years of education: Not on file  . Highest education level: Not on file  Occupational History  . Not on file  Social Needs  . Financial resource strain: Not hard at all  . Food insecurity:    Worry: Never true    Inability: Never true  . Transportation needs:    Medical:  No    Non-medical: No  Tobacco Use  . Smoking status: Former Smoker    Years: 5.00    Types: Cigarettes  . Smokeless tobacco: Never Used  Substance and Sexual Activity  . Alcohol use: No    Alcohol/week: 0.0 oz  . Drug use: No  . Sexual activity: Yes  Lifestyle  . Physical activity:    Days per week: 2 days    Minutes per session: 60 min  . Stress: Not on file  Relationships  . Social connections:    Talks on phone: Not on file    Gets together: Not on file    Attends religious service: Not on file    Active member of club or organization: Not on file    Attends meetings of clubs or organizations: Not on file    Relationship status: Not on file  Other Topics Concern  . Not on file  Social History Narrative  . Not on file    Outpatient Encounter Medications as  of 09/18/2017  Medication Sig  . acetaminophen (TYLENOL) 500 MG tablet Take 500 mg by mouth every 6 (six) hours as needed.  . Ascorbic Acid (VITAMIN C) 1000 MG tablet Take 1,000 mg by mouth daily.  Marland Kitchen BIOTIN 5000 PO Take 1 tablet by mouth daily.  . busPIRone (BUSPAR) 5 MG tablet Take 1 tablet (5 mg total) by mouth 2 (two) times daily. (Patient taking differently: Take 5 mg by mouth as needed. )  . Cholecalciferol (VITAMIN D PO) Take 5,000 Units by mouth daily.  . Coenzyme Q10 60 MG TABS Take 120 mg by mouth daily.  . diclofenac sodium (VOLTAREN) 1 % GEL Apply 1 application topically as needed.  Marland Kitchen esomeprazole (NEXIUM) 40 MG capsule Take 40 mg by mouth daily as needed.   . fluticasone (FLONASE) 50 MCG/ACT nasal spray Place 2 sprays into both nostrils daily.  . Hypertonic Nasal Wash (SINUS RINSE NA) Place into the nose as needed.  Marland Kitchen levothyroxine (LEVOTHROID) 75 MCG tablet Take 1 tablet (75 mcg total) by mouth every other day.  . levothyroxine (SYNTHROID, LEVOTHROID) 50 MCG tablet Take 1 tablet (50 mcg total) by mouth every other day.  Marland Kitchen MAGNESIUM PO Take 1 tablet by mouth.  . Multiple Vitamin (MULTIVITAMIN) LIQD Take 5 mLs by mouth daily.  . mupirocin ointment (BACTROBAN) 2 % Place 1 application into the nose 2 (two) times daily.  . Omega-3 Fatty Acids (FISH OIL) 1360 MG CAPS Take by mouth daily.  Marland Kitchen omeprazole (PRILOSEC) 20 MG capsule Take 1 capsule (20 mg total) by mouth daily.  . Probiotic Product (PROBIOTIC DAILY PO) Take 1 capsule by mouth.  . rosuvastatin (CRESTOR) 5 MG tablet Take 1 tablet (5 mg total) by mouth daily.  . TOLAK 4 % CREA Apply daily from knees down  . vitamin E 400 UNIT capsule Take 400 Units by mouth daily.   No facility-administered encounter medications on file as of 09/18/2017.     Activities of Daily Living In your present state of health, do you have any difficulty performing the following activities: 09/18/2017  Hearing? N  Vision? N  Difficulty concentrating or  making decisions? N  Walking or climbing stairs? N  Dressing or bathing? N  Doing errands, shopping? N  Preparing Food and eating ? N  Using the Toilet? N  In the past six months, have you accidently leaked urine? N  Do you have problems with loss of bowel control? N  Managing your Medications? N  Managing your Finances? N  Housekeeping or managing your Housekeeping? N  Some recent data might be hidden    Patient Care Team: Einar Pheasant, MD as PCP - General (Internal Medicine)    Assessment:   This is a routine wellness examination for [name].  The goal of the wellness visit is to assist the patient how to close the gaps in care and create a preventative care plan for the patient.   The roster of all physicians providing medical care to patient is listed in the Snapshot section of the chart.  Taking calcium VIT D as appropriate/Osteoporosis risk reviewed.    Safety issues reviewed; Smoke and carbon monoxide detectors in the home. No firearms or firearms locked in a safe within the home. Wears seatbelts when driving or riding with others. No violence in the home.  They do not have excessive sun exposure.  Discussed the need for sun protection: hats, long sleeves and the use of sunscreen if there is significant sun exposure.  Patient is alert, normal appearance, oriented to person/place/and time.  Correctly identified the president of the [place] and recalls of 3/3 words. Performs simple calculations and can read correct time from watch face. Displays appropriate judgement.  No new identified risk were noted.  No failures at ADL's or IADL's.    BMI- discussed the importance of a healthy diet, water intake and the benefits of aerobic exercise. Educational material provided.   24 hour diet recall: Regular diet.  Low cholesterol diet encouraged.   Dental- every 9 months.  Eye- Visual acuity not assessed per patient preference since they have regular follow up with the  ophthalmologist.  Wears corrective lenses.  Sleep patterns- Sleeps 8 hours at night.  Wakes feeling rested.   Health maintenance gaps- closed.  Patient Concerns: None at this time. Follow up with PCP as needed.  Exercise Activities and Dietary recommendations Current Exercise Habits: Home exercise routine, Type of exercise: stretching;yoga;walking, Time (Minutes): 60, Frequency (Times/Week): 2, Weekly Exercise (Minutes/Week): 120  Goals    . Maintain Healthy Lifestyle       Stay hydrated  Low cholesterol foods Exercise       Fall Risk Fall Risk  09/18/2017 06/19/2017 12/16/2016 08/02/2016 03/23/2016  Falls in the past year? No No No No No   Depression Screen PHQ 2/9 Scores 09/18/2017 06/19/2017 12/16/2016 08/02/2016  PHQ - 2 Score 0 0 0 0     Cognitive Function MMSE - Mini Mental State Exam 09/18/2017 08/02/2016 05/27/2015  Orientation to time 5 5 5   Orientation to Place 5 5 5   Registration 3 3 3   Attention/ Calculation 5 5 5   Recall 3 3 3   Language- name 2 objects 2 2 2   Language- repeat 1 1 1   Language- follow 3 step command 3 3 3   Language- read & follow direction 1 1 1   Write a sentence 1 1 1   Copy design 1 1 1   Total score 30 30 30         Immunization History  Administered Date(s) Administered  . Influenza Split 03/26/2012  . Influenza, High Dose Seasonal PF 03/07/2017  . Influenza,inj,Quad PF,6+ Mos 02/25/2013, 02/14/2014, 03/26/2015, 02/02/2016  . Pneumococcal Conjugate-13 04/22/2015  . Pneumococcal Polysaccharide-23 01/24/2011  . Tdap 11/17/2015   Screening Tests Health Maintenance  Topic Date Due  . INFLUENZA VACCINE  01/11/2018  . MAMMOGRAM  02/15/2018  . COLONOSCOPY  05/07/2023  . TETANUS/TDAP  11/16/2025  . DEXA SCAN  Completed  . Hepatitis C Screening  Completed  . PNA vac Low Risk Adult  Completed      Plan:    End of life planning; Advance aging; Advanced directives discussed. Copy of current HCPOA/Living Will requested.    I have personally  reviewed and noted the following in the patient's chart:   . Medical and social history . Use of alcohol, tobacco or illicit drugs  . Current medications and supplements . Functional ability and status . Nutritional status . Physical activity . Advanced directives . List of other physicians . Hospitalizations, surgeries, and ER visits in previous 12 months . Vitals . Screenings to include cognitive, depression, and falls . Referrals and appointments  In addition, I have reviewed and discussed with patient certain preventive protocols, quality metrics, and best practice recommendations. A written personalized care plan for preventive services as well as general preventive health recommendations were provided to patient.     Varney Biles, LPN  1/0/0712

## 2017-09-19 ENCOUNTER — Encounter: Payer: Self-pay | Admitting: Internal Medicine

## 2017-09-19 DIAGNOSIS — M5033 Other cervical disc degeneration, cervicothoracic region: Secondary | ICD-10-CM | POA: Diagnosis not present

## 2017-09-19 DIAGNOSIS — M5412 Radiculopathy, cervical region: Secondary | ICD-10-CM | POA: Diagnosis not present

## 2017-09-19 DIAGNOSIS — M9903 Segmental and somatic dysfunction of lumbar region: Secondary | ICD-10-CM | POA: Diagnosis not present

## 2017-09-19 DIAGNOSIS — M9901 Segmental and somatic dysfunction of cervical region: Secondary | ICD-10-CM | POA: Diagnosis not present

## 2017-09-26 ENCOUNTER — Ambulatory Visit: Payer: Medicare HMO

## 2017-09-27 DIAGNOSIS — M79671 Pain in right foot: Secondary | ICD-10-CM | POA: Diagnosis not present

## 2017-09-27 DIAGNOSIS — M5033 Other cervical disc degeneration, cervicothoracic region: Secondary | ICD-10-CM | POA: Diagnosis not present

## 2017-09-27 DIAGNOSIS — M5412 Radiculopathy, cervical region: Secondary | ICD-10-CM | POA: Diagnosis not present

## 2017-09-27 DIAGNOSIS — M19072 Primary osteoarthritis, left ankle and foot: Secondary | ICD-10-CM | POA: Diagnosis not present

## 2017-09-27 DIAGNOSIS — M9903 Segmental and somatic dysfunction of lumbar region: Secondary | ICD-10-CM | POA: Diagnosis not present

## 2017-09-27 DIAGNOSIS — M79672 Pain in left foot: Secondary | ICD-10-CM | POA: Diagnosis not present

## 2017-09-27 DIAGNOSIS — M9901 Segmental and somatic dysfunction of cervical region: Secondary | ICD-10-CM | POA: Diagnosis not present

## 2017-09-28 DIAGNOSIS — H11221 Conjunctival granuloma, right eye: Secondary | ICD-10-CM | POA: Diagnosis not present

## 2017-10-13 ENCOUNTER — Telehealth: Payer: Self-pay

## 2017-10-13 NOTE — Telephone Encounter (Signed)
callled and sched appt

## 2017-10-13 NOTE — Telephone Encounter (Signed)
Copied from [place] [id number]. Topic: Appointment Scheduling - Scheduling Inquiry for Clinic >> Oct 13, 2017  1:51 PM Synthia Innocent wrote: Reason for CRM: Has to cancel follow up for 10/17/17, no appt available till 02/09/18, patient wants to know if she needs to be seen by then. Please advise

## 2017-10-17 ENCOUNTER — Ambulatory Visit: Payer: Medicare HMO | Admitting: Internal Medicine

## 2017-10-24 ENCOUNTER — Ambulatory Visit (INDEPENDENT_AMBULATORY_CARE_PROVIDER_SITE_OTHER): Payer: Medicare HMO | Admitting: Family Medicine

## 2017-10-24 ENCOUNTER — Encounter: Payer: Self-pay | Admitting: Family Medicine

## 2017-10-24 VITALS — BP 132/74 | HR 58 | Temp 97.9°F | Resp 16 | Wt 145.5 lb

## 2017-10-24 DIAGNOSIS — J0140 Acute pansinusitis, unspecified: Secondary | ICD-10-CM

## 2017-10-24 MED ORDER — AMOXICILLIN-POT CLAVULANATE 875-125 MG PO TABS: 1.0000 | ORAL_TABLET | Freq: Two times a day (BID) | ORAL | 0 refills | Status: DC

## 2017-10-24 NOTE — Patient Instructions (Signed)
Please take antibiotic as prescribed with food. Also, please use a probiotic with antibiotic and continue this for 2 weeks after you have completed the antibiotic.  You can continue Mucinex DM for symptoms.  Please drink plenty of water enough for your urine to be pale yellow or clear. Follow up for evaluation if your symptoms do not improve in 3-4 days, worsen, or you develop a fever greater than 100.   Sinusitis, Adult Sinusitis is soreness and inflammation of your sinuses. Sinuses are hollow spaces in the bones around your face. They are located:  Around your eyes.  In the middle of your forehead.  Behind your nose.  In your cheekbones.  Your sinuses and nasal passages are lined with a stringy fluid (mucus). Mucus normally drains out of your sinuses. When your nasal tissues get inflamed or swollen, the mucus can get trapped or blocked so air cannot flow through your sinuses. This lets bacteria, viruses, and funguses grow, and that leads to infection. Follow these instructions at home: Medicines  Take, use, or apply over-the-counter and prescription medicines only as told by your doctor. These may include nasal sprays.  If you were prescribed an antibiotic medicine, take it as told by your doctor. Do not stop taking the antibiotic even if you start to feel better. Hydrate and Humidify  Drink enough water to keep your pee (urine) clear or pale yellow.  Use a cool mist humidifier to keep the humidity level in your home above 50%.  Breathe in steam for 10-15 minutes, 3-4 times a day or as told by your doctor. You can do this in the bathroom while a hot shower is running.  Try not to spend time in cool or dry air. Rest  Rest as much as possible.  Sleep with your head raised (elevated).  Make sure to get enough sleep each night. General instructions  Put a warm, moist washcloth on your face 3-4 times a day or as told by your doctor. This will help with discomfort.  Wash your  hands often with soap and water. If there is no soap and water, use hand sanitizer.  Do not smoke. Avoid being around people who are smoking (secondhand smoke).  Keep all follow-up visits as told by your doctor. This is important. Contact a doctor if:  You have a fever.  Your symptoms get worse.  Your symptoms do not get better within 10 days. Get help right away if:  You have a very bad headache.  You cannot stop throwing up (vomiting).  You have pain or swelling around your face or eyes.  You have trouble seeing.  You feel confused.  Your neck is stiff.  You have trouble breathing. This information is not intended to replace advice given to you by your health care provider. Make sure you discuss any questions you have with your health care provider. Document Released: 11/16/2007 Document Revised: 01/24/2016 Document Reviewed: 03/25/2015 Elsevier Interactive Patient Education  [employer].

## 2017-10-24 NOTE — Progress Notes (Signed)
Patient ID: [name], female   DOB: 01/24/1946, 72 y.o.   MRN: [id number]  PCP: Einar Pheasant, MD  Subjective:  [name] is a 72 y.o. year old very pleasant female patient who presents with symptoms including nasal congestion, sinus pressure, ear pressure, and chest congestion Associated "scratchy throat" and chills have been present. She reports that sore throat has improved. -started: 13 days ago, symptoms are not improving. -previous treatments: Saline spray, flonase, and Mucinex DM have provided limited benefit -sick contacts/travel/risks: denies flu exposure. She reports recent sick contact exposure with her travel companions of "cold like" symptoms after returning from traveling on an airplane. She traveled prior to these symptoms to [place] and camping at [employer] and denies illness while out of the country. -Hx of: allergies No recent antibiotic use No history of asthma She is not a smoker  ROS-denies fever, SOB, NVD, tooth pain  Pertinent Past Medical History- GERD  Medications- reviewed  Current Outpatient Medications  Medication Sig Dispense Refill  . acetaminophen (TYLENOL) 500 MG tablet Take 500 mg by mouth every 6 (six) hours as needed.    . Ascorbic Acid (VITAMIN C) 1000 MG tablet Take 1,000 mg by mouth daily.    Marland Kitchen BIOTIN 5000 PO Take 1 tablet by mouth daily.    . Cholecalciferol (VITAMIN D PO) Take 5,000 Units by mouth daily.    . Coenzyme Q10 60 MG TABS Take 120 mg by mouth daily.    . diclofenac sodium (VOLTAREN) 1 % GEL Apply 1 application topically as needed.    Marland Kitchen esomeprazole (NEXIUM) 40 MG capsule Take 40 mg by mouth daily as needed.     . fluticasone (FLONASE) 50 MCG/ACT nasal spray Place 2 sprays into both nostrils daily. 16 g 6  . Hypertonic Nasal Wash (SINUS RINSE NA) Place into the nose as needed.    Marland Kitchen levothyroxine (LEVOTHROID) 75 MCG tablet Take 1 tablet (75 mcg total) by mouth every other day. 90 tablet 1  . levothyroxine  (SYNTHROID, LEVOTHROID) 50 MCG tablet Take 1 tablet (50 mcg total) by mouth every other day. 90 tablet 1  . MAGNESIUM PO Take 1 tablet by mouth.    . Multiple Vitamin (MULTIVITAMIN) LIQD Take 5 mLs by mouth daily.    . mupirocin ointment (BACTROBAN) 2 % Place 1 application into the nose 2 (two) times daily. 22 g 0  . Omega-3 Fatty Acids (FISH OIL) 1360 MG CAPS Take by mouth daily.    Marland Kitchen omeprazole (PRILOSEC) 20 MG capsule Take 1 capsule (20 mg total) by mouth daily. 90 capsule 1  . Probiotic Product (PROBIOTIC DAILY PO) Take 1 capsule by mouth.    . rosuvastatin (CRESTOR) 5 MG tablet Take 1 tablet (5 mg total) by mouth daily. 90 tablet 1  . TOLAK 4 % CREA Apply daily from knees down  0  . vitamin E 400 UNIT capsule Take 400 Units by mouth daily.    . busPIRone (BUSPAR) 5 MG tablet Take 1 tablet (5 mg total) by mouth 2 (two) times daily. (Patient not taking: Reported on 10/24/2017) 180 tablet 1   No current facility-administered medications for this visit.     Objective: BP 132/74 (BP Location: Left Arm, Patient Position: Sitting, Cuff Size: Normal)   Pulse (!) 58   Temp 97.9 F (36.6 C) (Oral)   Resp 16   Wt 145 lb 8 oz (66 kg)   SpO2 97%   BMI 25.37 kg/m  Gen: NAD, resting  comfortably HEENT: Turbinates mildly erythematous, TMs normal bilaterally, oropharynx is clear and moist, post nasal drip noted, +frontal and maxillary sinus pressure present CV: RRR no murmurs rubs or gallops Lungs: CTAB no crackles, wheeze, rhonchi Abdomen: soft/nontender/nondistended/normal bowel sounds. No rebound or guarding.  Ext: no edema Skin: warm, dry, no rash Neuro: grossly normal, moves all extremities  Assessment/Plan: 1. Acute pansinusitis, recurrence not specified Exam and history support empiric treatment for bacterial sinusitis. Advised patient on supportive measures:  Get rest, drink plenty of fluids, and use tylenol or ibuprofen as needed for pain. Follow up if fever >101, if symptoms worsen or  if symptoms are not improved in 3-4 days. Patient  verbalizes understanding and agrees with plan. - amoxicillin-clavulanate (AUGMENTIN) 875-125 MG tablet; Take 1 tablet by mouth 2 (two) times daily.  Dispense: 20 tablet; Refill: 0  Finally, we reviewed reasons to return to care including if symptoms worsen or persist or new concerns arise- once again particularly shortness of breath or fever.   Katrina Quint, FNP

## 2017-10-25 DIAGNOSIS — M9903 Segmental and somatic dysfunction of lumbar region: Secondary | ICD-10-CM | POA: Diagnosis not present

## 2017-10-25 DIAGNOSIS — M9901 Segmental and somatic dysfunction of cervical region: Secondary | ICD-10-CM | POA: Diagnosis not present

## 2017-10-25 DIAGNOSIS — M5412 Radiculopathy, cervical region: Secondary | ICD-10-CM | POA: Diagnosis not present

## 2017-10-25 DIAGNOSIS — M5033 Other cervical disc degeneration, cervicothoracic region: Secondary | ICD-10-CM | POA: Diagnosis not present

## 2017-10-31 ENCOUNTER — Ambulatory Visit: Payer: Medicare HMO | Admitting: Family Medicine

## 2017-11-01 DIAGNOSIS — M9901 Segmental and somatic dysfunction of cervical region: Secondary | ICD-10-CM | POA: Diagnosis not present

## 2017-11-01 DIAGNOSIS — M5033 Other cervical disc degeneration, cervicothoracic region: Secondary | ICD-10-CM | POA: Diagnosis not present

## 2017-11-01 DIAGNOSIS — M9903 Segmental and somatic dysfunction of lumbar region: Secondary | ICD-10-CM | POA: Diagnosis not present

## 2017-11-01 DIAGNOSIS — M5412 Radiculopathy, cervical region: Secondary | ICD-10-CM | POA: Diagnosis not present

## 2017-11-14 DIAGNOSIS — M5033 Other cervical disc degeneration, cervicothoracic region: Secondary | ICD-10-CM | POA: Diagnosis not present

## 2017-11-14 DIAGNOSIS — M9901 Segmental and somatic dysfunction of cervical region: Secondary | ICD-10-CM | POA: Diagnosis not present

## 2017-11-14 DIAGNOSIS — M5412 Radiculopathy, cervical region: Secondary | ICD-10-CM | POA: Diagnosis not present

## 2017-11-14 DIAGNOSIS — M9903 Segmental and somatic dysfunction of lumbar region: Secondary | ICD-10-CM | POA: Diagnosis not present

## 2017-12-01 DIAGNOSIS — L565 Disseminated superficial actinic porokeratosis (DSAP): Secondary | ICD-10-CM | POA: Diagnosis not present

## 2017-12-01 DIAGNOSIS — L821 Other seborrheic keratosis: Secondary | ICD-10-CM | POA: Diagnosis not present

## 2017-12-01 DIAGNOSIS — Z86018 Personal history of other benign neoplasm: Secondary | ICD-10-CM | POA: Diagnosis not present

## 2017-12-01 DIAGNOSIS — L578 Other skin changes due to chronic exposure to nonionizing radiation: Secondary | ICD-10-CM | POA: Diagnosis not present

## 2017-12-01 DIAGNOSIS — L570 Actinic keratosis: Secondary | ICD-10-CM | POA: Diagnosis not present

## 2017-12-01 DIAGNOSIS — D485 Neoplasm of uncertain behavior of skin: Secondary | ICD-10-CM | POA: Diagnosis not present

## 2017-12-01 DIAGNOSIS — L918 Other hypertrophic disorders of the skin: Secondary | ICD-10-CM | POA: Diagnosis not present

## 2017-12-07 DIAGNOSIS — K219 Gastro-esophageal reflux disease without esophagitis: Secondary | ICD-10-CM | POA: Diagnosis not present

## 2017-12-07 DIAGNOSIS — Z86010 Personal history of colonic polyps: Secondary | ICD-10-CM | POA: Diagnosis not present

## 2017-12-07 DIAGNOSIS — K449 Diaphragmatic hernia without obstruction or gangrene: Secondary | ICD-10-CM | POA: Diagnosis not present

## 2017-12-07 DIAGNOSIS — Z1211 Encounter for screening for malignant neoplasm of colon: Secondary | ICD-10-CM | POA: Diagnosis not present

## 2017-12-13 DIAGNOSIS — M9903 Segmental and somatic dysfunction of lumbar region: Secondary | ICD-10-CM | POA: Diagnosis not present

## 2017-12-13 DIAGNOSIS — M5412 Radiculopathy, cervical region: Secondary | ICD-10-CM | POA: Diagnosis not present

## 2017-12-13 DIAGNOSIS — M9901 Segmental and somatic dysfunction of cervical region: Secondary | ICD-10-CM | POA: Diagnosis not present

## 2017-12-13 DIAGNOSIS — M5033 Other cervical disc degeneration, cervicothoracic region: Secondary | ICD-10-CM | POA: Diagnosis not present

## 2017-12-15 ENCOUNTER — Other Ambulatory Visit: Payer: Self-pay | Admitting: Internal Medicine

## 2017-12-21 ENCOUNTER — Encounter: Payer: Self-pay | Admitting: Internal Medicine

## 2017-12-21 ENCOUNTER — Ambulatory Visit (INDEPENDENT_AMBULATORY_CARE_PROVIDER_SITE_OTHER): Payer: Medicare HMO | Admitting: Internal Medicine

## 2017-12-21 VITALS — BP 120/78 | HR 64 | Temp 97.6°F | Resp 18 | Ht 64.0 in | Wt 147.5 lb

## 2017-12-21 DIAGNOSIS — E039 Hypothyroidism, unspecified: Secondary | ICD-10-CM | POA: Diagnosis not present

## 2017-12-21 DIAGNOSIS — E7800 Pure hypercholesterolemia, unspecified: Secondary | ICD-10-CM

## 2017-12-21 DIAGNOSIS — R69 Illness, unspecified: Secondary | ICD-10-CM | POA: Diagnosis not present

## 2017-12-21 DIAGNOSIS — K2100 Gastro-esophageal reflux disease with esophagitis, without bleeding: Secondary | ICD-10-CM

## 2017-12-21 DIAGNOSIS — R739 Hyperglycemia, unspecified: Secondary | ICD-10-CM

## 2017-12-21 DIAGNOSIS — F439 Reaction to severe stress, unspecified: Secondary | ICD-10-CM | POA: Diagnosis not present

## 2017-12-21 DIAGNOSIS — Z860100 Personal history of colon polyps, unspecified: Secondary | ICD-10-CM

## 2017-12-21 DIAGNOSIS — L989 Disorder of the skin and subcutaneous tissue, unspecified: Secondary | ICD-10-CM | POA: Diagnosis not present

## 2017-12-21 DIAGNOSIS — M7989 Other specified soft tissue disorders: Secondary | ICD-10-CM

## 2017-12-21 MED ORDER — EPINEPHRINE 0.3 MG/0.3ML IJ SOAJ: 0.3000 mg | Freq: Once | INTRAMUSCULAR | 0 refills | Status: AC

## 2017-12-21 NOTE — Progress Notes (Signed)
Patient ID: [name], female   DOB: 15-Oct-1945, 72 y.o.   MRN: [id number]   Subjective:    Patient ID: [name], female    DOB: December 25, 1945, 72 y.o.   MRN: [id number]  HPI  Patient here for a scheduled follow up.  She reports she is doing better.  Was evaluated 10/24/17.  Diagnosed with sinusitis.  Treated with augmentin.  No sinus symptoms now.  No cough or congestion.  No sob.  No acid reflux.  Some minimal dysphagia.  Has f/u EGD scheduled 01/2018.  No abdominal pain.  Bowels moving.  No urine change.  Persistent skin lesion.  Request referral to Dr Sarajane Jews.  She was stung by wasp two days ago.  Right hand swelled.  No swelling of her tongue, lips, etc.  Stress is better.  Handling things well.  Does not feel needs anything more at this time.     Past Medical History:  Diagnosis Date  . Arthritis   . Environmental allergies   . Esophagitis    [place], followed by Dr Vennie Homans  . GERD (gastroesophageal reflux disease)   . Hypercholesterolemia   . Hypothyroidism    Past Surgical History:  Procedure Laterality Date  . [place]  . KNEE ARTHROSCOPY  12/03/07   left  . KNEE ARTHROSCOPY  06/25/08   left  . NASAL SINUS SURGERY  12/06  . REPLACEMENT TOTAL KNEE  01/04/09   left  . RHINOPLASTY  1986  . VAGINAL HYSTERECTOMY  1980   ovaries left in place  . WRIST SURGERY  1970   cyst removal   Family History  Problem Relation Age of Onset  . Heart disease Father        heart attack  . Diabetes Father   . Hyperlipidemia Paternal Grandmother   . Diabetes Paternal Grandmother   . Stroke Paternal Grandmother   . Hyperlipidemia Paternal Grandfather   . Diabetes Paternal Grandfather   . Prostate cancer Maternal Uncle   . Pancreatic cancer Cousin   . Multiple myeloma Brother   . Breast cancer Maternal Aunt   . Breast cancer Maternal Aunt    Social History   Socioeconomic History  . Marital status: Married    Spouse name: Not on file  . Number of children: Not  on file  . Years of education: Not on file  . Highest education level: Not on file  Occupational History  . Not on file  Social Needs  . Financial resource strain: Not hard at all  . Food insecurity:    Worry: Never true    Inability: Never true  . Transportation needs:    Medical: No    Non-medical: No  Tobacco Use  . Smoking status: Former Smoker    Years: 5.00    Types: Cigarettes  . Smokeless tobacco: Never Used  Substance and Sexual Activity  . Alcohol use: No    Alcohol/week: 0.0 oz  . Drug use: No  . Sexual activity: Yes  Lifestyle  . Physical activity:    Days per week: 2 days    Minutes per session: 60 min  . Stress: Not on file  Relationships  . Social connections:    Talks on phone: Not on file    Gets together: Not on file    Attends religious service: Not on file    Active member of club or organization: Not on file    Attends meetings of clubs or organizations: Not on  file    Relationship status: Not on file  Other Topics Concern  . Not on file  Social History Narrative  . Not on file    Outpatient Encounter Medications as of 12/21/2017  Medication Sig  . acetaminophen (TYLENOL) 500 MG tablet Take 500 mg by mouth every 6 (six) hours as needed.  . Ascorbic Acid (VITAMIN C) 1000 MG tablet Take 1,000 mg by mouth daily.  Marland Kitchen BIOTIN 5000 PO Take 1 tablet by mouth daily.  . Cholecalciferol (VITAMIN D PO) Take 5,000 Units by mouth daily.  . Coenzyme Q10 60 MG TABS Take 120 mg by mouth daily.  . diclofenac sodium (VOLTAREN) 1 % GEL Apply 1 application topically as needed.  Marland Kitchen esomeprazole (NEXIUM) 40 MG capsule Take 20 mg by mouth daily as needed.   . fluticasone (FLONASE) 50 MCG/ACT nasal spray Place 2 sprays into both nostrils daily.  . Hypertonic Nasal Wash (SINUS RINSE NA) Place into the nose as needed.  Marland Kitchen levothyroxine (LEVOTHROID) 75 MCG tablet Take 1 tablet (75 mcg total) by mouth every other day.  . levothyroxine (SYNTHROID, LEVOTHROID) 50 MCG tablet  Take 1 tablet (50 mcg total) by mouth every other day.  Marland Kitchen MAGNESIUM PO Take 1 tablet by mouth.  . Multiple Vitamin (MULTIVITAMIN) LIQD Take 5 mLs by mouth daily.  . Omega-3 Fatty Acids (FISH OIL) 1360 MG CAPS Take by mouth daily.  Marland Kitchen omeprazole (PRILOSEC) 20 MG capsule TAKE 1 CAPSULE DAILY  . Probiotic Product (PROBIOTIC DAILY PO) Take 1 capsule by mouth.  . rosuvastatin (CRESTOR) 5 MG tablet Take 1 tablet (5 mg total) by mouth daily.  . TOLAK 4 % CREA Apply daily from knees down  . vitamin E 400 UNIT capsule Take 400 Units by mouth daily.  Marland Kitchen amoxicillin-clavulanate (AUGMENTIN) 875-125 MG tablet Take 1 tablet by mouth 2 (two) times daily. (Patient not taking: Reported on 12/21/2017)  . busPIRone (BUSPAR) 5 MG tablet Take 1 tablet (5 mg total) by mouth 2 (two) times daily. (Patient not taking: Reported on 10/24/2017)  . [EXPIRED] EPINEPHrine (EPIPEN 2-PAK) 0.3 mg/0.3 mL IJ SOAJ injection Inject 0.3 mLs (0.3 mg total) into the muscle once for 1 dose.  . mupirocin ointment (BACTROBAN) 2 % Place 1 application into the nose 2 (two) times daily. (Patient not taking: Reported on 12/21/2017)   No facility-administered encounter medications on file as of 12/21/2017.     Review of Systems  Constitutional: Negative for appetite change and unexpected weight change.  HENT: Negative for congestion and sinus pressure.   Respiratory: Negative for cough, chest tightness and shortness of breath.   Cardiovascular: Negative for chest pain, palpitations and leg swelling.  Gastrointestinal: Negative for abdominal pain, diarrhea, nausea and vomiting.  Genitourinary: Negative for difficulty urinating and dysuria.  Musculoskeletal: Negative for joint swelling and myalgias.       Soft tissue swelling - right hand.    Skin: Negative for color change and rash.  Neurological: Negative for dizziness, light-headedness and headaches.  Psychiatric/Behavioral: Negative for agitation and dysphoric mood.       Objective:      Physical Exam  Constitutional: She appears well-developed and well-nourished. No distress.  HENT:  Nose: Nose normal.  Mouth/Throat: Oropharynx is clear and moist.  Neck: Neck supple. No thyromegaly present.  Cardiovascular: Normal rate and regular rhythm.  Pulmonary/Chest: Breath sounds normal. No respiratory distress. She has no wheezes.  Abdominal: Soft. Bowel sounds are normal. There is no tenderness.  Musculoskeletal: She exhibits no edema  or tenderness.  Soft tissue swelling - right hand.  No increased warmth.   Lymphadenopathy:    She has no cervical adenopathy.  Skin: No rash noted. No erythema.  Psychiatric: She has a normal mood and affect. Her behavior is normal.    BP 120/78 (BP Location: Left Arm, Patient Position: Sitting, Cuff Size: Normal)   Pulse 64   Temp 97.6 F (36.4 C) (Oral)   Resp 18   Ht 5' 4"  (1.626 m)   Wt 147 lb 8 oz (66.9 kg)   SpO2 98%   BMI 25.32 kg/m  Wt Readings from Last 3 Encounters:  12/21/17 147 lb 8 oz (66.9 kg)  10/24/17 145 lb 8 oz (66 kg)  09/18/17 146 lb 1.9 oz (66.3 kg)     Lab Results  Component Value Date   WBC 5.3 12/16/2016   HGB 12.9 12/16/2016   HCT 37.7 12/16/2016   PLT 243.0 12/16/2016   GLUCOSE 85 09/18/2017   CHOL 180 09/18/2017   TRIG 117.0 09/18/2017   HDL 60.60 09/18/2017   LDLDIRECT 123.4 02/27/2013   LDLCALC 96 09/18/2017   ALT 18 09/18/2017   AST 19 09/18/2017   NA 142 09/18/2017   K 3.9 09/18/2017   CL 105 09/18/2017   CREATININE 0.61 09/18/2017   BUN 11 09/18/2017   CO2 29 09/18/2017   TSH 0.63 09/18/2017   HGBA1C 5.7 09/18/2017    Mm Screening Breast Tomo Bilateral  Result Date: 02/16/2017 CLINICAL DATA:  Screening. EXAM: 2D DIGITAL SCREENING BILATERAL MAMMOGRAM WITH CAD AND ADJUNCT TOMO COMPARISON:  Previous exam(s). ACR Breast Density Category c: The breast tissue is heterogeneously dense, which may obscure small masses. FINDINGS: There are no findings suspicious for malignancy. Images were  processed with CAD. IMPRESSION: No mammographic evidence of malignancy. A result letter of this screening mammogram will be mailed directly to the patient. RECOMMENDATION: Screening mammogram in one year. (Code:SM-B-01Y) BI-RADS CATEGORY  1: Negative. Electronically Signed   By: Pamelia Hoit M.D.   On: 02/16/2017 08:06       Assessment & Plan:   Problem List Items Addressed This Visit    GERD (gastroesophageal reflux disease)    Acid reflux controlled on current regimen.  Minimal dysphagia.  Seeing GI.  Has f/u EGD scheduled 01/2018.        History of colonic polyps    Has f/u colonoscopy in 01/2018.  Last 04/2013.        Hypercholesteremia    On crestor.  Low cholesterol diet and exercise.  Follow lipid panel and liver function tests.        Relevant Orders   CBC with Differential/Platelet   Hepatic function panel   Lipid panel   Basic metabolic panel   Hypothyroidism    On thyroid replacement.  Follow tsh.        Stress    Doing better.  Stress is better.  Handling things relatively well.  Follow.        Swelling of right hand    Swelling right hand s/p bee sting.  Swelling improving.  No other swelling.  No problems with breathing.  Gave her rx for epi pen.  Follow.         Other Visit Diagnoses    Hyperglycemia    -  Primary   Relevant Orders   Hemoglobin A1c   Skin lesion       Persistent skin lesion.  Has seen dermatology locally.  Request referral to Dr Sarajane Jews.  Relevant Orders   Ambulatory referral to Dermatology       Einar Pheasant, MD

## 2017-12-21 NOTE — Progress Notes (Signed)
Pre-visit discussion using our clinic review tool. No additional management support is needed unless otherwise documented below in the visit note.  

## 2017-12-24 ENCOUNTER — Encounter: Payer: Self-pay | Admitting: Internal Medicine

## 2017-12-24 DIAGNOSIS — M7989 Other specified soft tissue disorders: Secondary | ICD-10-CM | POA: Insufficient documentation

## 2017-12-24 NOTE — Assessment & Plan Note (Signed)
Acid reflux controlled on current regimen.  Minimal dysphagia.  Seeing GI.  Has f/u EGD scheduled 01/2018.

## 2017-12-24 NOTE — Assessment & Plan Note (Signed)
On thyroid replacement.  Follow tsh.  

## 2017-12-24 NOTE — Assessment & Plan Note (Signed)
Swelling right hand s/p bee sting.  Swelling improving.  No other swelling.  No problems with breathing.  Gave her rx for epi pen.  Follow.

## 2017-12-24 NOTE — Assessment & Plan Note (Signed)
Doing better.  Stress is better.  Handling things relatively well.  Follow.

## 2017-12-24 NOTE — Assessment & Plan Note (Signed)
Has f/u colonoscopy in 01/2018.  Last 04/2013.

## 2017-12-24 NOTE — Assessment & Plan Note (Signed)
On crestor.  Low cholesterol diet and exercise.  Follow lipid panel and liver function tests.   

## 2017-12-27 ENCOUNTER — Telehealth: Payer: Self-pay

## 2017-12-27 DIAGNOSIS — M5412 Radiculopathy, cervical region: Secondary | ICD-10-CM | POA: Diagnosis not present

## 2017-12-27 DIAGNOSIS — M5033 Other cervical disc degeneration, cervicothoracic region: Secondary | ICD-10-CM | POA: Diagnosis not present

## 2017-12-27 DIAGNOSIS — M9901 Segmental and somatic dysfunction of cervical region: Secondary | ICD-10-CM | POA: Diagnosis not present

## 2017-12-27 DIAGNOSIS — M9903 Segmental and somatic dysfunction of lumbar region: Secondary | ICD-10-CM | POA: Diagnosis not present

## 2017-12-27 NOTE — Telephone Encounter (Signed)
Copied from [place]. Topic: Referral - Request >> Dec 27, 2017  3:34 PM Yvette Rack wrote: Reason for CRM: Pt request second opinion referral to [facility] Dr. Griselda Miner. Pt provided fax# [phone]

## 2017-12-29 ENCOUNTER — Telehealth: Payer: Self-pay | Admitting: Internal Medicine

## 2017-12-29 NOTE — Telephone Encounter (Signed)
Dr. Nicki Reaper do you have a copy of Pathology report I do not see in chart.

## 2017-12-29 NOTE — Telephone Encounter (Signed)
Copied from [place] [id number]. Topic: Quick Communication - See Telephone Encounter >> Dec 29, 2017 12:15 PM Conception Chancy, NT wrote: CRM for notification. See Telephone encounter for: 12/29/17.  Brayton Layman is calling from [facility] and is requesting a path report for this patient. Patient appointment is in December but is trying to get her seen sooner. Please fax this to [phone] attention [facility]  Cb# [phone]

## 2017-12-29 NOTE — Telephone Encounter (Signed)
It has been sent to Dr. Eaddy Graff office at [facility] through [facility]

## 2017-12-31 NOTE — Telephone Encounter (Signed)
She has been seeing Dr Phillip Heal - dermatology.  The patient asked me to refer to Dr Sarajane Jews for a second opinion.  I assume that Dr Phillip Heal would have any pathology if done.  Thanks.

## 2018-01-01 NOTE — Telephone Encounter (Signed)
Left message form [name] at [facility] dermatology to call office .

## 2018-01-01 NOTE — Telephone Encounter (Signed)
Patient is scheduled for December 13th, they need pathology report in order to schedule sooner from Dr Elveria Rising office. I can call patient to come by and sign release .

## 2018-01-02 NOTE — Telephone Encounter (Signed)
Left message for patient to call office need release signed to attain pathology report from Dr. Elveria Rising office. [facility] may advise patient.

## 2018-01-03 NOTE — Telephone Encounter (Signed)
Pt is out of town and states she can come in on 01/05/2018 to sign

## 2018-01-15 ENCOUNTER — Encounter: Payer: Self-pay | Admitting: Internal Medicine

## 2018-01-15 DIAGNOSIS — K635 Polyp of colon: Secondary | ICD-10-CM | POA: Diagnosis not present

## 2018-01-15 DIAGNOSIS — K228 Other specified diseases of esophagus: Secondary | ICD-10-CM | POA: Diagnosis not present

## 2018-01-15 DIAGNOSIS — Z1211 Encounter for screening for malignant neoplasm of colon: Secondary | ICD-10-CM | POA: Diagnosis not present

## 2018-01-15 DIAGNOSIS — R1310 Dysphagia, unspecified: Secondary | ICD-10-CM | POA: Diagnosis not present

## 2018-01-15 DIAGNOSIS — Z86010 Personal history of colonic polyps: Secondary | ICD-10-CM | POA: Diagnosis not present

## 2018-01-15 DIAGNOSIS — K209 Esophagitis, unspecified: Secondary | ICD-10-CM | POA: Diagnosis not present

## 2018-01-15 DIAGNOSIS — K2970 Gastritis, unspecified, without bleeding: Secondary | ICD-10-CM | POA: Diagnosis not present

## 2018-01-15 DIAGNOSIS — D120 Benign neoplasm of cecum: Secondary | ICD-10-CM | POA: Diagnosis not present

## 2018-01-15 LAB — HM COLONOSCOPY

## 2018-01-16 ENCOUNTER — Other Ambulatory Visit: Payer: Self-pay | Admitting: Internal Medicine

## 2018-01-16 ENCOUNTER — Telehealth: Payer: Self-pay | Admitting: Internal Medicine

## 2018-01-16 DIAGNOSIS — Z1231 Encounter for screening mammogram for malignant neoplasm of breast: Secondary | ICD-10-CM

## 2018-01-16 NOTE — Telephone Encounter (Signed)
Copied from [place]. Topic: General - Other >> Jan 16, 2018  4:39 PM [name], NT wrote: Reason for CRM: Patient called and stated she left a pathology report with Dr Nicki Reaper during her visit  12/21/17 to be faxed to Dr Brantley Stage office she went back on  01/05/18 to sign a release, and she has been they never received the report and she is unable to be seen until December without it , please fax to [phone]  cb [phone] she would like a call once this is faxed  [phone]

## 2018-01-16 NOTE — Telephone Encounter (Signed)
Please advise 

## 2018-01-17 DIAGNOSIS — M9901 Segmental and somatic dysfunction of cervical region: Secondary | ICD-10-CM | POA: Diagnosis not present

## 2018-01-17 DIAGNOSIS — M9903 Segmental and somatic dysfunction of lumbar region: Secondary | ICD-10-CM | POA: Diagnosis not present

## 2018-01-17 DIAGNOSIS — M5033 Other cervical disc degeneration, cervicothoracic region: Secondary | ICD-10-CM | POA: Diagnosis not present

## 2018-01-17 DIAGNOSIS — M5412 Radiculopathy, cervical region: Secondary | ICD-10-CM | POA: Diagnosis not present

## 2018-01-17 NOTE — Telephone Encounter (Signed)
Left message letting patient know that I have faxed pathology report twice and to call back if needed

## 2018-01-30 DIAGNOSIS — M9901 Segmental and somatic dysfunction of cervical region: Secondary | ICD-10-CM | POA: Diagnosis not present

## 2018-01-30 DIAGNOSIS — M5033 Other cervical disc degeneration, cervicothoracic region: Secondary | ICD-10-CM | POA: Diagnosis not present

## 2018-01-30 DIAGNOSIS — M9903 Segmental and somatic dysfunction of lumbar region: Secondary | ICD-10-CM | POA: Diagnosis not present

## 2018-01-30 DIAGNOSIS — M5412 Radiculopathy, cervical region: Secondary | ICD-10-CM | POA: Diagnosis not present

## 2018-02-14 DIAGNOSIS — M5412 Radiculopathy, cervical region: Secondary | ICD-10-CM | POA: Diagnosis not present

## 2018-02-14 DIAGNOSIS — M9903 Segmental and somatic dysfunction of lumbar region: Secondary | ICD-10-CM | POA: Diagnosis not present

## 2018-02-14 DIAGNOSIS — M5033 Other cervical disc degeneration, cervicothoracic region: Secondary | ICD-10-CM | POA: Diagnosis not present

## 2018-02-14 DIAGNOSIS — M9901 Segmental and somatic dysfunction of cervical region: Secondary | ICD-10-CM | POA: Diagnosis not present

## 2018-03-04 ENCOUNTER — Other Ambulatory Visit: Payer: Self-pay | Admitting: Internal Medicine

## 2018-03-09 DIAGNOSIS — R69 Illness, unspecified: Secondary | ICD-10-CM | POA: Diagnosis not present

## 2018-03-14 ENCOUNTER — Ambulatory Visit
Admission: RE | Admit: 2018-03-14 | Discharge: 2018-03-14 | Disposition: A | Discharge status: Home / Self Care | Payer: Medicare HMO | Source: Ambulatory Visit | Attending: Internal Medicine | Admitting: Internal Medicine

## 2018-03-14 DIAGNOSIS — Z1231 Encounter for screening mammogram for malignant neoplasm of breast: Secondary | ICD-10-CM | POA: Diagnosis present

## 2018-03-16 DIAGNOSIS — M7541 Impingement syndrome of right shoulder: Secondary | ICD-10-CM | POA: Diagnosis not present

## 2018-03-16 DIAGNOSIS — M542 Cervicalgia: Secondary | ICD-10-CM | POA: Diagnosis not present

## 2018-03-20 DIAGNOSIS — M5033 Other cervical disc degeneration, cervicothoracic region: Secondary | ICD-10-CM | POA: Diagnosis not present

## 2018-03-20 DIAGNOSIS — M9903 Segmental and somatic dysfunction of lumbar region: Secondary | ICD-10-CM | POA: Diagnosis not present

## 2018-03-20 DIAGNOSIS — M9901 Segmental and somatic dysfunction of cervical region: Secondary | ICD-10-CM | POA: Diagnosis not present

## 2018-03-20 DIAGNOSIS — M5412 Radiculopathy, cervical region: Secondary | ICD-10-CM | POA: Diagnosis not present

## 2018-03-27 ENCOUNTER — Other Ambulatory Visit (INDEPENDENT_AMBULATORY_CARE_PROVIDER_SITE_OTHER): Payer: Medicare HMO

## 2018-03-27 DIAGNOSIS — R739 Hyperglycemia, unspecified: Secondary | ICD-10-CM | POA: Diagnosis not present

## 2018-03-27 DIAGNOSIS — E7800 Pure hypercholesterolemia, unspecified: Secondary | ICD-10-CM

## 2018-03-27 LAB — BASIC METABOLIC PANEL WITH GFR
BUN: 17 mg/dL (ref 6–23)
CO2: 32 meq/L (ref 19–32)
Calcium: 9.2 mg/dL (ref 8.4–10.5)
Chloride: 104 meq/L (ref 96–112)
Creatinine, Ser: 0.61 mg/dL (ref 0.40–1.20)
GFR: 102.17 mL/min
Glucose, Bld: 79 mg/dL (ref 70–99)
Potassium: 3.7 meq/L (ref 3.5–5.1)
Sodium: 142 meq/L (ref 135–145)

## 2018-03-27 LAB — CBC WITH DIFFERENTIAL/PLATELET
Basophils Absolute: 0 10*3/uL (ref 0.0–0.1)
Basophils Relative: 0.4 % (ref 0.0–3.0)
Eosinophils Absolute: 0.1 10*3/uL (ref 0.0–0.7)
Eosinophils Relative: 0.9 % (ref 0.0–5.0)
HCT: 38.5 % (ref 36.0–46.0)
Hemoglobin: 13 g/dL (ref 12.0–15.0)
Lymphocytes Relative: 44.7 % (ref 12.0–46.0)
Lymphs Abs: 3.2 10*3/uL (ref 0.7–4.0)
MCHC: 33.9 g/dL (ref 30.0–36.0)
MCV: 96.1 fl (ref 78.0–100.0)
Monocytes Absolute: 0.7 10*3/uL (ref 0.1–1.0)
Monocytes Relative: 10.2 % (ref 3.0–12.0)
Neutro Abs: 3.2 10*3/uL (ref 1.4–7.7)
Neutrophils Relative %: 43.8 % (ref 43.0–77.0)
Platelets: 276 10*3/uL (ref 150.0–400.0)
RBC: 4.01 Mil/uL (ref 3.87–5.11)
RDW: 13.1 % (ref 11.5–15.5)
WBC: 7.2 10*3/uL (ref 4.0–10.5)

## 2018-03-27 LAB — LIPID PANEL
Cholesterol: 178 mg/dL (ref 0–200)
HDL: 66.4 mg/dL
LDL Cholesterol: 92 mg/dL (ref 0–99)
NonHDL: 111.37
Total CHOL/HDL Ratio: 3
Triglycerides: 99 mg/dL (ref 0.0–149.0)
VLDL: 19.8 mg/dL (ref 0.0–40.0)

## 2018-03-27 LAB — HEPATIC FUNCTION PANEL
ALT: 15 U/L (ref 0–35)
AST: 13 U/L (ref 0–37)
Albumin: 4.3 g/dL (ref 3.5–5.2)
Alkaline Phosphatase: 65 U/L (ref 39–117)
Bilirubin, Direct: 0.1 mg/dL (ref 0.0–0.3)
Total Bilirubin: 0.5 mg/dL (ref 0.2–1.2)
Total Protein: 6.8 g/dL (ref 6.0–8.3)

## 2018-03-27 LAB — HEMOGLOBIN A1C: Hgb A1c MFr Bld: 5.5 % (ref 4.6–6.5)

## 2018-03-28 ENCOUNTER — Encounter: Payer: Self-pay | Admitting: Internal Medicine

## 2018-04-07 DIAGNOSIS — M5412 Radiculopathy, cervical region: Secondary | ICD-10-CM | POA: Diagnosis not present

## 2018-04-07 DIAGNOSIS — M5030 Other cervical disc degeneration, unspecified cervical region: Secondary | ICD-10-CM | POA: Diagnosis not present

## 2018-04-07 DIAGNOSIS — M542 Cervicalgia: Secondary | ICD-10-CM | POA: Diagnosis not present

## 2018-04-24 ENCOUNTER — Encounter: Payer: Self-pay | Admitting: Internal Medicine

## 2018-04-24 ENCOUNTER — Ambulatory Visit (INDEPENDENT_AMBULATORY_CARE_PROVIDER_SITE_OTHER): Payer: Medicare HMO | Admitting: Internal Medicine

## 2018-04-24 VITALS — BP 130/88 | HR 64 | Temp 97.7°F | Resp 18 | Wt 151.4 lb

## 2018-04-24 DIAGNOSIS — E7800 Pure hypercholesterolemia, unspecified: Secondary | ICD-10-CM | POA: Diagnosis not present

## 2018-04-24 DIAGNOSIS — E039 Hypothyroidism, unspecified: Secondary | ICD-10-CM | POA: Diagnosis not present

## 2018-04-24 DIAGNOSIS — R69 Illness, unspecified: Secondary | ICD-10-CM | POA: Diagnosis not present

## 2018-04-24 DIAGNOSIS — K2100 Gastro-esophageal reflux disease with esophagitis, without bleeding: Secondary | ICD-10-CM

## 2018-04-24 DIAGNOSIS — M542 Cervicalgia: Secondary | ICD-10-CM | POA: Diagnosis not present

## 2018-04-24 DIAGNOSIS — H6121 Impacted cerumen, right ear: Secondary | ICD-10-CM | POA: Diagnosis not present

## 2018-04-24 DIAGNOSIS — R739 Hyperglycemia, unspecified: Secondary | ICD-10-CM

## 2018-04-24 DIAGNOSIS — F439 Reaction to severe stress, unspecified: Secondary | ICD-10-CM | POA: Diagnosis not present

## 2018-04-24 MED ORDER — CARBAMIDE PEROXIDE 6.5 % OT SOLN: OTIC | 0 refills | Status: DC

## 2018-04-24 NOTE — Progress Notes (Signed)
Patient ID: [name], female   DOB: 1946/05/12, 72 y.o.   MRN: [id number]   Subjective:    Patient ID: [name], female    DOB: July 30, 1945, 72 y.o.   MRN: [id number]  HPI  Patient here for a scheduled follow up. Has been having pain in her right shoulder.  Evaluated by ortho.  Recent numbness in her second and third fingers.  Had neck xray.  Scheduled for MRI c-spine.  Was given prednisone taper.  Feeling better.  Taking tylenol.  No chest pain.  No sob.   No acid reflux.  Sees GI.  Had f/u EGD 01/2018.  Need results.  No abdominal pain.  Bowels moving.  She will call ENT to f/u regarding her right neck.  Right ear - fullness.     Past Medical History:  Diagnosis Date  . Arthritis   . Environmental allergies   . Esophagitis    [place], followed by Dr Vennie Homans  . GERD (gastroesophageal reflux disease)   . Hypercholesterolemia   . Hypothyroidism    Past Surgical History:  Procedure Laterality Date  . [place]  . KNEE ARTHROSCOPY  12/03/07   left  . KNEE ARTHROSCOPY  06/25/08   left  . NASAL SINUS SURGERY  12/06  . REPLACEMENT TOTAL KNEE  01/04/09   left  . RHINOPLASTY  1986  . VAGINAL HYSTERECTOMY  1980   ovaries left in place  . WRIST SURGERY  1970   cyst removal   Family History  Problem Relation Age of Onset  . Heart disease Father        heart attack  . Diabetes Father   . Hyperlipidemia Paternal Grandmother   . Diabetes Paternal Grandmother   . Stroke Paternal Grandmother   . Hyperlipidemia Paternal Grandfather   . Diabetes Paternal Grandfather   . Prostate cancer Maternal Uncle   . Pancreatic cancer Cousin   . Multiple myeloma Brother   . Breast cancer Maternal Aunt   . Breast cancer Maternal Aunt    Social History   Socioeconomic History  . Marital status: Married    Spouse name: Not on file  . Number of children: Not on file  . Years of education: Not on file  . Highest education level: Not on file  Occupational History  . Not on  file  Social Needs  . Financial resource strain: Not hard at all  . Food insecurity:    Worry: Never true    Inability: Never true  . Transportation needs:    Medical: No    Non-medical: No  Tobacco Use  . Smoking status: Former Smoker    Years: 5.00    Types: Cigarettes  . Smokeless tobacco: Never Used  Substance and Sexual Activity  . Alcohol use: No    Alcohol/week: 0.0 standard drinks  . Drug use: No  . Sexual activity: Yes  Lifestyle  . Physical activity:    Days per week: 2 days    Minutes per session: 60 min  . Stress: Not on file  Relationships  . Social connections:    Talks on phone: Not on file    Gets together: Not on file    Attends religious service: Not on file    Active member of club or organization: Not on file    Attends meetings of clubs or organizations: Not on file    Relationship status: Not on file  Other Topics Concern  . Not on file  Social History Narrative  . Not on file    Outpatient Encounter Medications as of 04/24/2018  Medication Sig  . acetaminophen (TYLENOL) 500 MG tablet Take 500 mg by mouth every 6 (six) hours as needed.  Marland Kitchen amoxicillin-clavulanate (AUGMENTIN) 875-125 MG tablet Take 1 tablet by mouth 2 (two) times daily. (Patient not taking: Reported on 12/21/2017)  . Ascorbic Acid (VITAMIN C) 1000 MG tablet Take 1,000 mg by mouth daily.  Marland Kitchen BIOTIN 5000 PO Take 1 tablet by mouth daily.  . busPIRone (BUSPAR) 5 MG tablet Take 1 tablet (5 mg total) by mouth 2 (two) times daily. (Patient not taking: Reported on 10/24/2017)  . carbamide peroxide (DEBROX) 6.5 % OTIC solution 3-4 drops in right ear daily.  Massage for approximately 5 minutes  . Cholecalciferol (VITAMIN D PO) Take 5,000 Units by mouth daily.  . Coenzyme Q10 60 MG TABS Take 120 mg by mouth daily.  . diclofenac sodium (VOLTAREN) 1 % GEL Apply 1 application topically as needed.  . fluticasone (FLONASE) 50 MCG/ACT nasal spray Place 2 sprays into both nostrils daily.  . Hypertonic  Nasal Wash (SINUS RINSE NA) Place into the nose as needed.  Marland Kitchen levothyroxine (LEVOTHROID) 75 MCG tablet Take 1 tablet (75 mcg total) by mouth every other day.  . levothyroxine (SYNTHROID, LEVOTHROID) 50 MCG tablet Take 1 tablet (50 mcg total) by mouth every other day.  Marland Kitchen MAGNESIUM PO Take 1 tablet by mouth.  . Multiple Vitamin (MULTIVITAMIN) LIQD Take 5 mLs by mouth daily.  . Omega-3 Fatty Acids (FISH OIL) 1360 MG CAPS Take by mouth daily.  Marland Kitchen omeprazole (PRILOSEC) 40 MG capsule Take 40 mg by mouth daily.  . Probiotic Product (PROBIOTIC DAILY PO) Take 1 capsule by mouth.  . rosuvastatin (CRESTOR) 5 MG tablet TAKE 1 TABLET DAILY  . TOLAK 4 % CREA Apply daily from knees down  . vitamin E 400 UNIT capsule Take 400 Units by mouth daily.  . [DISCONTINUED] esomeprazole (NEXIUM) 40 MG capsule Take 20 mg by mouth daily as needed.   . [DISCONTINUED] mupirocin ointment (BACTROBAN) 2 % Place 1 application into the nose 2 (two) times daily. (Patient not taking: Reported on 12/21/2017)  . [DISCONTINUED] omeprazole (PRILOSEC) 20 MG capsule TAKE 1 CAPSULE DAILY   No facility-administered encounter medications on file as of 04/24/2018.     Review of Systems  Constitutional: Negative for appetite change and unexpected weight change.  HENT: Negative for congestion and sinus pressure.   Respiratory: Negative for cough, chest tightness and shortness of breath.   Cardiovascular: Negative for chest pain, palpitations and leg swelling.  Gastrointestinal: Negative for abdominal pain, diarrhea, nausea and vomiting.  Genitourinary: Negative for difficulty urinating and dysuria.  Musculoskeletal: Negative for joint swelling and myalgias.       Neck/shoulder issues as outlined.  Numbness better after prednisone taper.    Skin: Negative for color change and rash.  Neurological: Negative for dizziness, light-headedness and headaches.  Psychiatric/Behavioral: Negative for agitation and dysphoric mood.         Objective:    Physical Exam  Constitutional: She appears well-developed and well-nourished. No distress.  HENT:  Nose: Nose normal.  Mouth/Throat: Oropharynx is clear and moist.  Right ear - impacted with cerumen.   Neck: Neck supple. No thyromegaly present.  Cardiovascular: Normal rate and regular rhythm.  Pulmonary/Chest: Breath sounds normal. No respiratory distress. She has no wheezes.  Abdominal: Soft. Bowel sounds are normal. There is no tenderness.  Musculoskeletal: She exhibits no edema  or tenderness.  Lymphadenopathy:    She has no cervical adenopathy.  Skin: No rash noted. No erythema.  Psychiatric: She has a normal mood and affect. Her behavior is normal.    BP 130/88 (BP Location: Left Arm, Patient Position: Sitting, Cuff Size: Normal)   Pulse 64   Temp 97.7 F (36.5 C) (Oral)   Resp 18   Wt 151 lb 6.4 oz (68.7 kg)   SpO2 98%   BMI 25.99 kg/m  Wt Readings from Last 3 Encounters:  04/24/18 151 lb 6.4 oz (68.7 kg)  12/21/17 147 lb 8 oz (66.9 kg)  10/24/17 145 lb 8 oz (66 kg)     Lab Results  Component Value Date   WBC 7.2 03/27/2018   HGB 13.0 03/27/2018   HCT 38.5 03/27/2018   PLT 276.0 03/27/2018   GLUCOSE 79 03/27/2018   CHOL 178 03/27/2018   TRIG 99.0 03/27/2018   HDL 66.40 03/27/2018   LDLDIRECT 123.4 02/27/2013   LDLCALC 92 03/27/2018   ALT 15 03/27/2018   AST 13 03/27/2018   NA 142 03/27/2018   K 3.7 03/27/2018   CL 104 03/27/2018   CREATININE 0.61 03/27/2018   BUN 17 03/27/2018   CO2 32 03/27/2018   TSH 0.63 09/18/2017   HGBA1C 5.5 03/27/2018    Mm 3d Screen Breast Bilateral  Result Date: 03/14/2018 CLINICAL DATA:  Screening. EXAM: DIGITAL SCREENING BILATERAL MAMMOGRAM WITH TOMO AND CAD COMPARISON:  Previous exam(s). ACR Breast Density Category c: The breast tissue is heterogeneously dense, which may obscure small masses. FINDINGS: There are no findings suspicious for malignancy. Images were processed with CAD. IMPRESSION: No  mammographic evidence of malignancy. A result letter of this screening mammogram will be mailed directly to the patient. RECOMMENDATION: Screening mammogram in one year. (Code:SM-B-01Y) BI-RADS CATEGORY  1: Negative. Electronically Signed   By: Fidela Salisbury M.D.   On: 03/14/2018 17:06       Assessment & Plan:   Problem List Items Addressed This Visit    GERD (gastroesophageal reflux disease)    Had EGD 01/2018.  Reflux controlled on current regimen.  Follow.        Relevant Medications   omeprazole (PRILOSEC) 40 MG capsule   Hypercholesteremia    On crestor.  Low cholesterol diet and exercise.  Follow lipid panel and liver function tests.        Relevant Orders   Hepatic function panel   Lipid panel   Basic metabolic panel   Hypothyroidism    On thyroid replacement.  Follow tsh.        Relevant Orders   TSH   Neck pain    Having shoulder pain and finger numbness previously.  Better since prednisone taper.  Saw Dr Nelva Bush.  Schedule for MRI c-spine.        Stress    Doing better.  Feels she is handling things relatively well.  Follow.         Other Visit Diagnoses    Impacted cerumen of right ear    -  Primary   Right ear cerumen impaction.  Debrox.  Return for ear irrigation.     Hyperglycemia       Relevant Orders   Hemoglobin A1c       Einar Pheasant, MD

## 2018-04-29 ENCOUNTER — Encounter: Payer: Self-pay | Admitting: Internal Medicine

## 2018-04-29 DIAGNOSIS — M542 Cervicalgia: Secondary | ICD-10-CM | POA: Insufficient documentation

## 2018-04-29 NOTE — Assessment & Plan Note (Signed)
On thyroid replacement.  Follow tsh.  

## 2018-04-29 NOTE — Assessment & Plan Note (Signed)
Doing better.  Feels she is handling things relatively well.  Follow.

## 2018-04-29 NOTE — Assessment & Plan Note (Signed)
Having shoulder pain and finger numbness previously.  Better since prednisone taper.  Saw Dr Nelva Bush.  Schedule for MRI c-spine.

## 2018-04-29 NOTE — Assessment & Plan Note (Signed)
Had EGD 01/2018.  Reflux controlled on current regimen.  Follow.

## 2018-04-29 NOTE — Assessment & Plan Note (Signed)
On crestor.  Low cholesterol diet and exercise.  Follow lipid panel and liver function tests.   

## 2018-05-01 DIAGNOSIS — M5033 Other cervical disc degeneration, cervicothoracic region: Secondary | ICD-10-CM | POA: Diagnosis not present

## 2018-05-01 DIAGNOSIS — M9903 Segmental and somatic dysfunction of lumbar region: Secondary | ICD-10-CM | POA: Diagnosis not present

## 2018-05-01 DIAGNOSIS — M9901 Segmental and somatic dysfunction of cervical region: Secondary | ICD-10-CM | POA: Diagnosis not present

## 2018-05-01 DIAGNOSIS — M5412 Radiculopathy, cervical region: Secondary | ICD-10-CM | POA: Diagnosis not present

## 2018-05-02 ENCOUNTER — Ambulatory Visit: Payer: Medicare HMO

## 2018-05-04 DIAGNOSIS — H6061 Unspecified chronic otitis externa, right ear: Secondary | ICD-10-CM | POA: Diagnosis not present

## 2018-05-04 DIAGNOSIS — H6123 Impacted cerumen, bilateral: Secondary | ICD-10-CM | POA: Diagnosis not present

## 2018-05-04 DIAGNOSIS — R221 Localized swelling, mass and lump, neck: Secondary | ICD-10-CM | POA: Diagnosis not present

## 2018-05-08 DIAGNOSIS — M5412 Radiculopathy, cervical region: Secondary | ICD-10-CM | POA: Diagnosis not present

## 2018-05-08 DIAGNOSIS — M9903 Segmental and somatic dysfunction of lumbar region: Secondary | ICD-10-CM | POA: Diagnosis not present

## 2018-05-08 DIAGNOSIS — M9901 Segmental and somatic dysfunction of cervical region: Secondary | ICD-10-CM | POA: Diagnosis not present

## 2018-05-08 DIAGNOSIS — M5033 Other cervical disc degeneration, cervicothoracic region: Secondary | ICD-10-CM | POA: Diagnosis not present

## 2018-05-09 DIAGNOSIS — R69 Illness, unspecified: Secondary | ICD-10-CM | POA: Diagnosis not present

## 2018-05-14 DIAGNOSIS — M5030 Other cervical disc degeneration, unspecified cervical region: Secondary | ICD-10-CM | POA: Diagnosis not present

## 2018-05-16 DIAGNOSIS — J019 Acute sinusitis, unspecified: Secondary | ICD-10-CM | POA: Diagnosis not present

## 2018-05-23 ENCOUNTER — Other Ambulatory Visit: Payer: Self-pay | Admitting: Otolaryngology

## 2018-05-23 DIAGNOSIS — R221 Localized swelling, mass and lump, neck: Secondary | ICD-10-CM

## 2018-05-25 DIAGNOSIS — L821 Other seborrheic keratosis: Secondary | ICD-10-CM | POA: Diagnosis not present

## 2018-05-25 DIAGNOSIS — L814 Other melanin hyperpigmentation: Secondary | ICD-10-CM | POA: Diagnosis not present

## 2018-05-25 DIAGNOSIS — D225 Melanocytic nevi of trunk: Secondary | ICD-10-CM | POA: Diagnosis not present

## 2018-05-25 DIAGNOSIS — D2262 Melanocytic nevi of left upper limb, including shoulder: Secondary | ICD-10-CM | POA: Diagnosis not present

## 2018-05-25 DIAGNOSIS — D220 Melanocytic nevi of lip: Secondary | ICD-10-CM | POA: Diagnosis not present

## 2018-05-25 DIAGNOSIS — I8311 Varicose veins of right lower extremity with inflammation: Secondary | ICD-10-CM | POA: Diagnosis not present

## 2018-05-25 DIAGNOSIS — I8312 Varicose veins of left lower extremity with inflammation: Secondary | ICD-10-CM | POA: Diagnosis not present

## 2018-05-25 DIAGNOSIS — D0471 Carcinoma in situ of skin of right lower limb, including hip: Secondary | ICD-10-CM | POA: Diagnosis not present

## 2018-05-25 DIAGNOSIS — L570 Actinic keratosis: Secondary | ICD-10-CM | POA: Diagnosis not present

## 2018-05-25 DIAGNOSIS — D1801 Hemangioma of skin and subcutaneous tissue: Secondary | ICD-10-CM | POA: Diagnosis not present

## 2018-05-25 DIAGNOSIS — D2261 Melanocytic nevi of right upper limb, including shoulder: Secondary | ICD-10-CM | POA: Diagnosis not present

## 2018-05-29 DIAGNOSIS — J019 Acute sinusitis, unspecified: Secondary | ICD-10-CM | POA: Diagnosis not present

## 2018-05-29 DIAGNOSIS — R05 Cough: Secondary | ICD-10-CM | POA: Diagnosis not present

## 2018-05-31 ENCOUNTER — Ambulatory Visit
Admission: RE | Admit: 2018-05-31 | Discharge: 2018-05-31 | Disposition: A | Discharge status: Home / Self Care | Payer: Medicare HMO | Source: Ambulatory Visit | Attending: Otolaryngology | Admitting: Otolaryngology

## 2018-05-31 DIAGNOSIS — R221 Localized swelling, mass and lump, neck: Secondary | ICD-10-CM | POA: Insufficient documentation

## 2018-05-31 LAB — POCT I-STAT CREATININE: Creatinine, Ser: 0.7 mg/dL (ref 0.44–1.00)

## 2018-05-31 MED ORDER — IOPAMIDOL (ISOVUE-300) INJECTION 61%
75.0000 mL | Freq: Once | INTRAVENOUS | Status: AC | PRN
  Administered 2018-05-31: 75 mL via INTRAVENOUS

## 2018-06-04 ENCOUNTER — Other Ambulatory Visit: Payer: Self-pay | Admitting: Internal Medicine

## 2018-06-04 DIAGNOSIS — L111 Transient acantholytic dermatosis [Grover]: Secondary | ICD-10-CM | POA: Diagnosis not present

## 2018-06-04 DIAGNOSIS — L570 Actinic keratosis: Secondary | ICD-10-CM | POA: Diagnosis not present

## 2018-06-04 DIAGNOSIS — D0471 Carcinoma in situ of skin of right lower limb, including hip: Secondary | ICD-10-CM | POA: Diagnosis not present

## 2018-06-12 ENCOUNTER — Telehealth: Payer: Self-pay | Admitting: Internal Medicine

## 2018-06-12 ENCOUNTER — Other Ambulatory Visit: Payer: Self-pay

## 2018-06-12 MED ORDER — LEVOTHYROXINE SODIUM 75 MCG PO TABS: 75.0000 ug | ORAL_TABLET | ORAL | 1 refills | Status: DC

## 2018-06-12 MED ORDER — ACYCLOVIR 800 MG PO TABS: 800.0000 mg | ORAL_TABLET | Freq: Two times a day (BID) | ORAL | 0 refills | Status: DC

## 2018-06-12 NOTE — Telephone Encounter (Signed)
Also needing a refill on   levothyroxine (LEVOTHROID) 75 MCG tablet

## 2018-06-12 NOTE — Telephone Encounter (Signed)
I have sent in patient's synthroid, but I do not see acyclovir on current med list?

## 2018-06-12 NOTE — Telephone Encounter (Signed)
rx sent in for acyclovir.  Please notify pt.  Thanks

## 2018-06-12 NOTE — Telephone Encounter (Signed)
Copied from [place] [id number]. Topic: Quick Communication - See Telephone Encounter >> Jun 12, 2018  8:38 AM Conception Chancy, NT wrote: CRM for notification. See Telephone encounter for: 06/12/18.  Patient is calling and requesting a refill on the following mediation. She states she is leaving 06/16/18 to go out of town for 3 months and is needing this for cold sores.  acyclovir (ZOVIRAX) 800 MG tablet  [id number] - [place], [place] - [address] [place] [address] Phone: [phone] Fax: [phone]

## 2018-06-14 ENCOUNTER — Other Ambulatory Visit: Payer: Self-pay

## 2018-06-14 MED ORDER — LEVOTHYROXINE SODIUM 75 MCG PO TABS: 75.0000 ug | ORAL_TABLET | ORAL | 1 refills | Status: DC

## 2018-06-14 NOTE — Telephone Encounter (Signed)
Patient notified & has picked up script.

## 2018-06-15 ENCOUNTER — Other Ambulatory Visit: Payer: Self-pay | Admitting: Otolaryngology

## 2018-06-15 DIAGNOSIS — R221 Localized swelling, mass and lump, neck: Secondary | ICD-10-CM

## 2018-06-15 DIAGNOSIS — R591 Generalized enlarged lymph nodes: Secondary | ICD-10-CM | POA: Diagnosis not present

## 2018-06-15 DIAGNOSIS — J328 Other chronic sinusitis: Secondary | ICD-10-CM | POA: Diagnosis not present

## 2018-06-18 ENCOUNTER — Ambulatory Visit
Admission: RE | Admit: 2018-06-18 | Discharge: 2018-06-18 | Disposition: A | Discharge status: Home / Self Care | Payer: Medicare HMO | Source: Ambulatory Visit | Attending: Otolaryngology | Admitting: Otolaryngology

## 2018-06-18 DIAGNOSIS — R599 Enlarged lymph nodes, unspecified: Secondary | ICD-10-CM | POA: Diagnosis not present

## 2018-06-18 DIAGNOSIS — M5033 Other cervical disc degeneration, cervicothoracic region: Secondary | ICD-10-CM | POA: Diagnosis not present

## 2018-06-18 DIAGNOSIS — M9903 Segmental and somatic dysfunction of lumbar region: Secondary | ICD-10-CM | POA: Diagnosis not present

## 2018-06-18 DIAGNOSIS — M9901 Segmental and somatic dysfunction of cervical region: Secondary | ICD-10-CM | POA: Diagnosis not present

## 2018-06-18 DIAGNOSIS — M5412 Radiculopathy, cervical region: Secondary | ICD-10-CM | POA: Diagnosis not present

## 2018-06-18 DIAGNOSIS — R221 Localized swelling, mass and lump, neck: Secondary | ICD-10-CM | POA: Diagnosis present

## 2018-06-19 ENCOUNTER — Other Ambulatory Visit: Payer: Self-pay | Admitting: Otolaryngology

## 2018-06-19 DIAGNOSIS — R221 Localized swelling, mass and lump, neck: Secondary | ICD-10-CM

## 2018-07-07 IMAGING — MG MM DIGITAL SCREENING BILAT W/ TOMO W/ CAD
9 of 12 series · 9 of 28 positions shown · non-contrast
Comparison: Previous exam(s).

CLINICAL DATA: Screening.

EXAM:
2D DIGITAL SCREENING BILATERAL MAMMOGRAM WITH CAD AND ADJUNCT TOMO

[L CC]
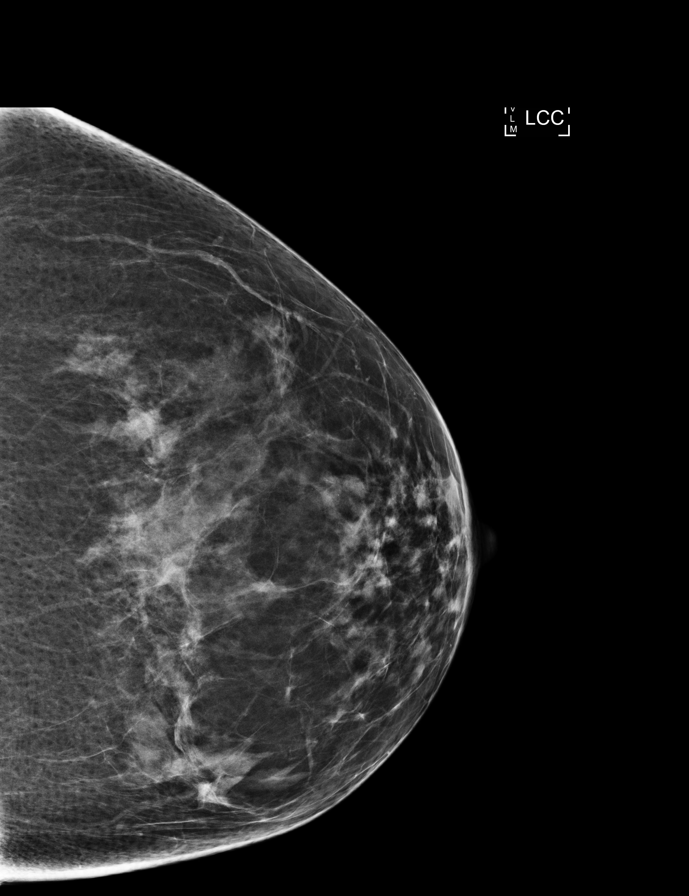

[R CC]
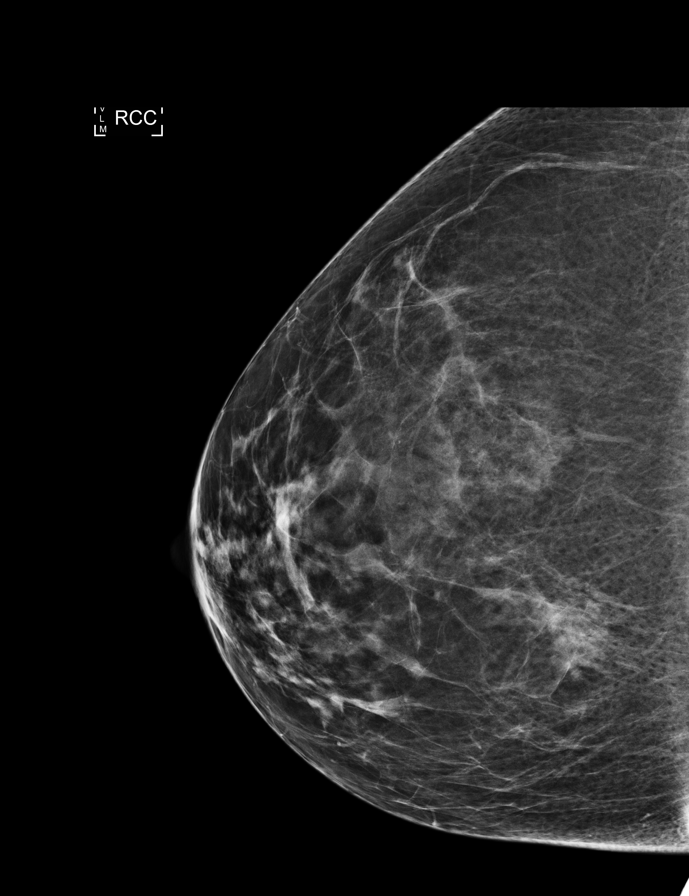

[L MLO]
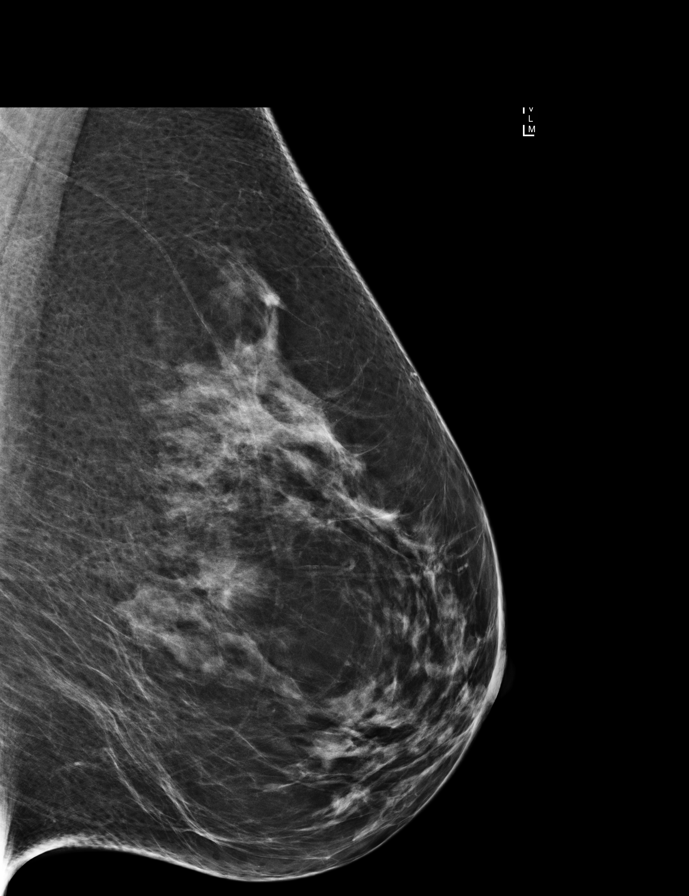

[R CC synth-2D]
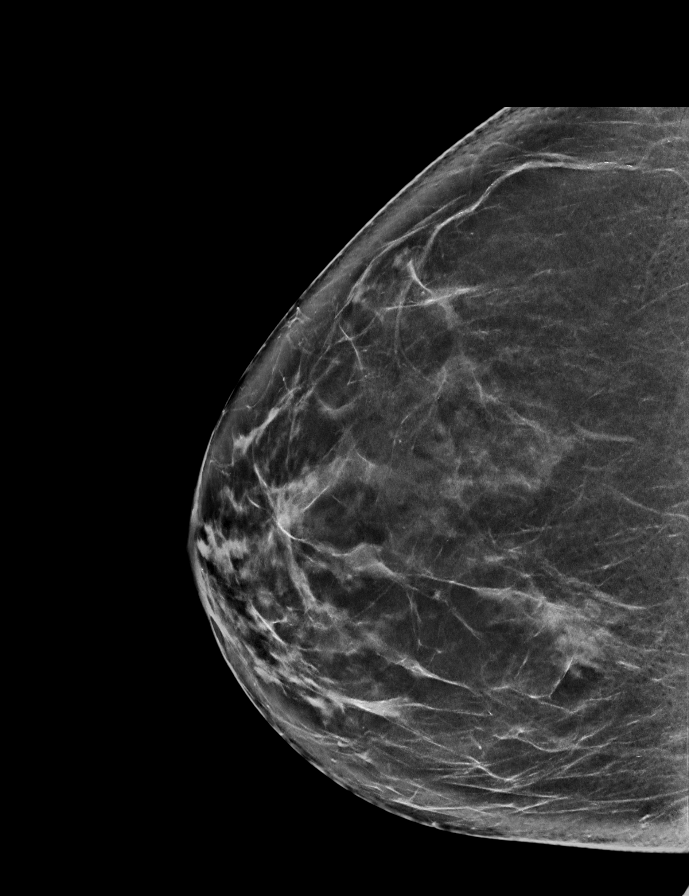

[L CC synth-2D]
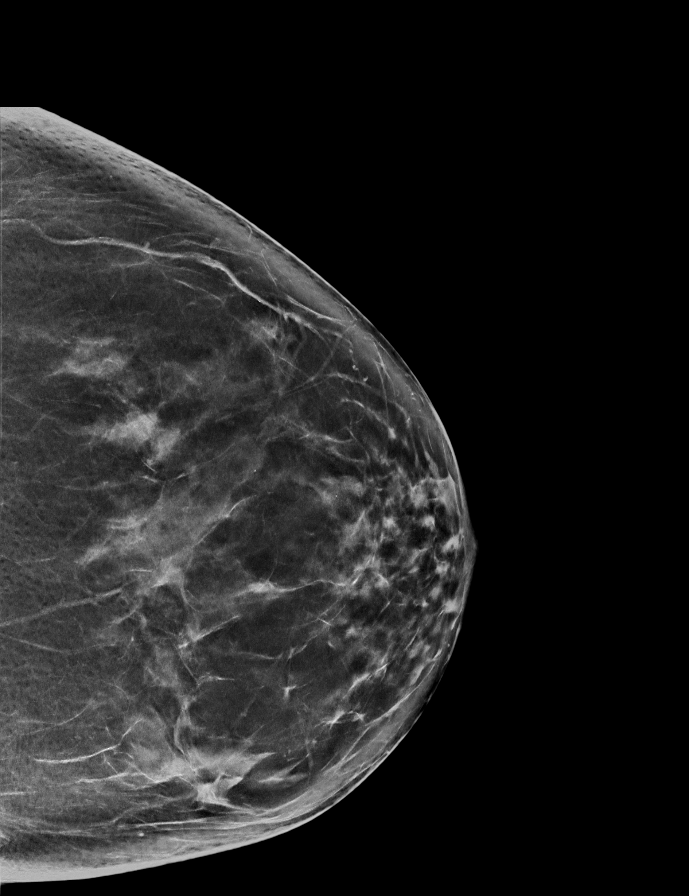

[L MLO synth-2D]
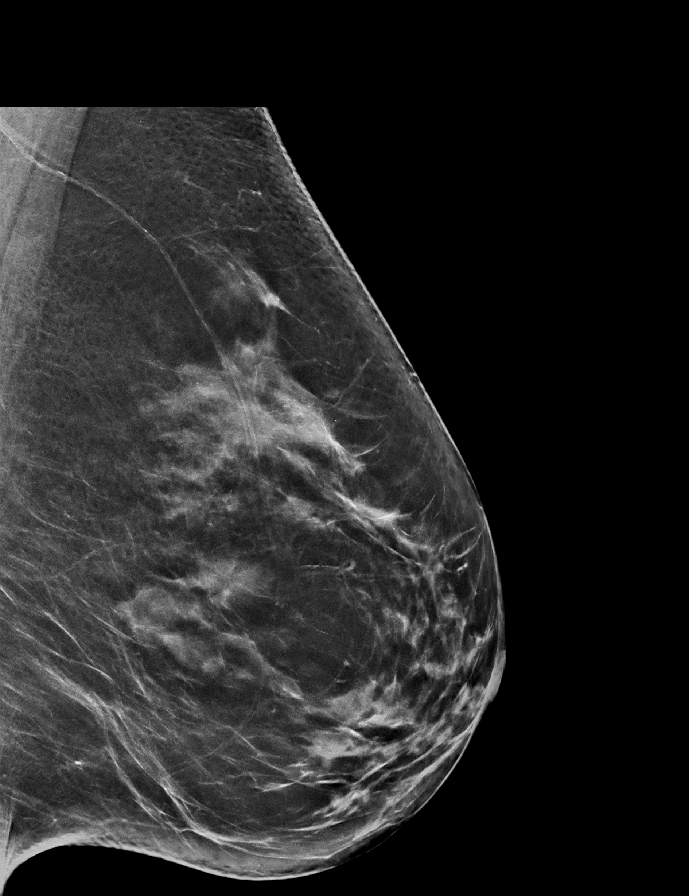

[R MLO]
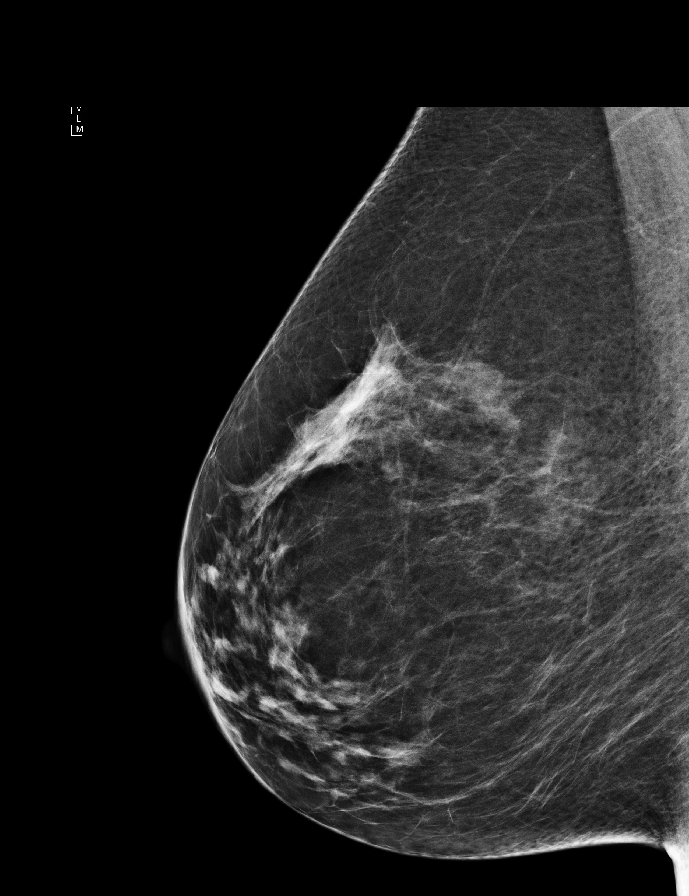

[R MLO synth-2D]
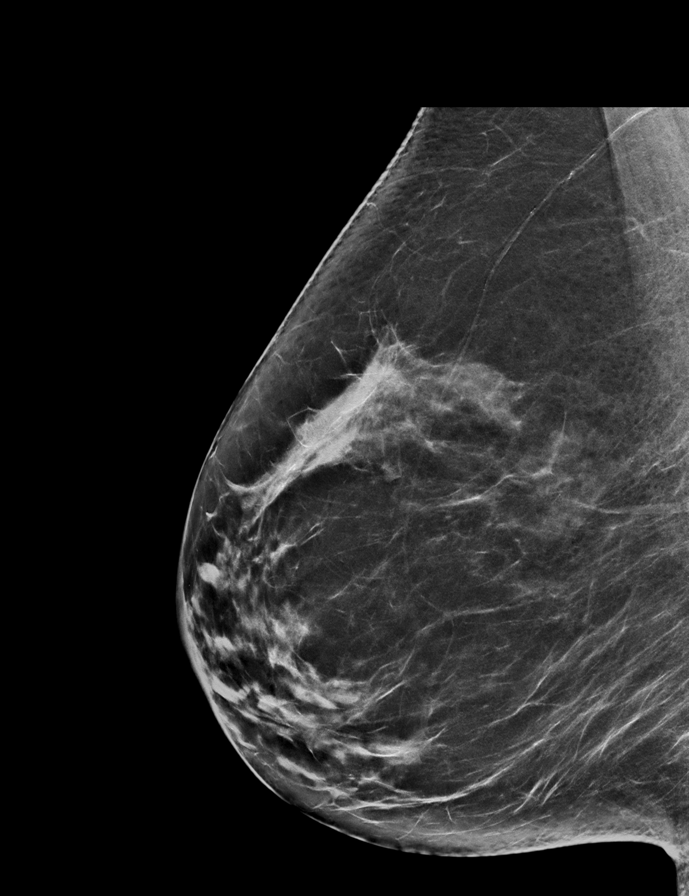

[R MLO tomo · tomo slice 37/73.0]
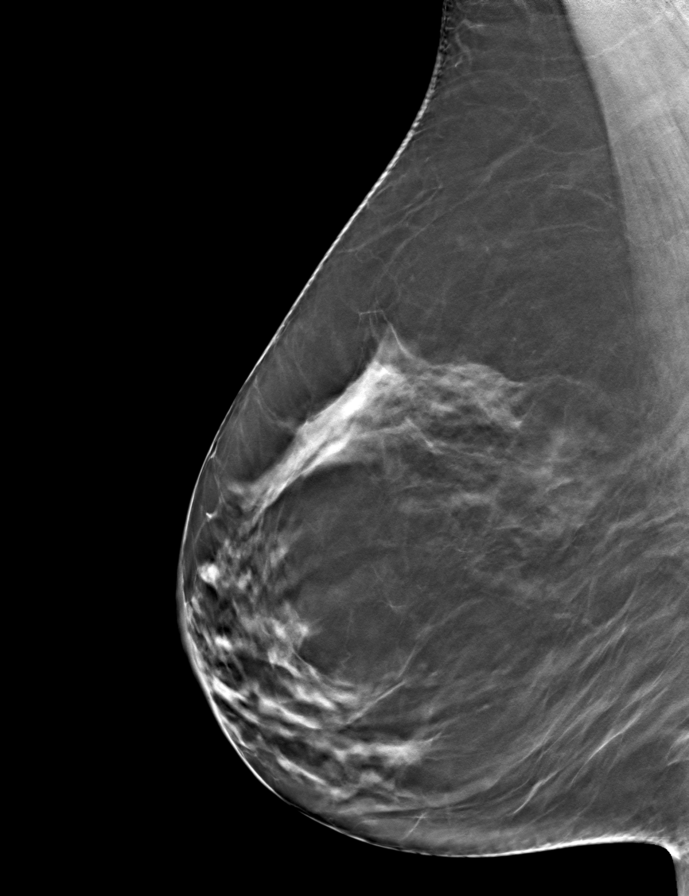

[9 of 28 positions shown; findings below may reference images not displayed]

ACR Breast Density Category c: The breast tissue is heterogeneously
dense, which may obscure small masses.
FINDINGS: There are no findings suspicious for malignancy. Images were
processed with CAD.
IMPRESSION: No mammographic evidence of malignancy. A result letter of this
screening mammogram will be mailed directly to the patient.

RECOMMENDATION:
Screening mammogram in one year. (Code:TN-[id number])

BI-RADS CATEGORY  1: Negative.

## 2018-09-19 ENCOUNTER — Telehealth: Payer: Self-pay | Admitting: *Deleted

## 2018-09-19 ENCOUNTER — Other Ambulatory Visit (INDEPENDENT_AMBULATORY_CARE_PROVIDER_SITE_OTHER): Payer: Medicare HMO

## 2018-09-19 ENCOUNTER — Other Ambulatory Visit: Payer: Self-pay

## 2018-09-19 DIAGNOSIS — E039 Hypothyroidism, unspecified: Secondary | ICD-10-CM | POA: Diagnosis not present

## 2018-09-19 DIAGNOSIS — R739 Hyperglycemia, unspecified: Secondary | ICD-10-CM | POA: Diagnosis not present

## 2018-09-19 DIAGNOSIS — E7800 Pure hypercholesterolemia, unspecified: Secondary | ICD-10-CM | POA: Diagnosis not present

## 2018-09-19 LAB — HEPATIC FUNCTION PANEL
ALT: 14 U/L (ref 0–35)
AST: 17 U/L (ref 0–37)
Albumin: 4.5 g/dL (ref 3.5–5.2)
Alkaline Phosphatase: 70 U/L (ref 39–117)
Bilirubin, Direct: 0.1 mg/dL (ref 0.0–0.3)
Total Bilirubin: 0.5 mg/dL (ref 0.2–1.2)
Total Protein: 7 g/dL (ref 6.0–8.3)

## 2018-09-19 LAB — LIPID PANEL
Cholesterol: 212 mg/dL — ABNORMAL HIGH (ref 0–200)
HDL: 53.9 mg/dL
LDL Cholesterol: 127 mg/dL — ABNORMAL HIGH (ref 0–99)
NonHDL: 157.75
Total CHOL/HDL Ratio: 4
Triglycerides: 152 mg/dL — ABNORMAL HIGH (ref 0.0–149.0)
VLDL: 30.4 mg/dL (ref 0.0–40.0)

## 2018-09-19 LAB — BASIC METABOLIC PANEL WITH GFR
BUN: 16 mg/dL (ref 6–23)
CO2: 28 meq/L (ref 19–32)
Calcium: 10 mg/dL (ref 8.4–10.5)
Chloride: 104 meq/L (ref 96–112)
Creatinine, Ser: 0.64 mg/dL (ref 0.40–1.20)
GFR: 90.83 mL/min
Glucose, Bld: 92 mg/dL (ref 70–99)
Potassium: 4.4 meq/L (ref 3.5–5.1)
Sodium: 140 meq/L (ref 135–145)

## 2018-09-19 LAB — HEMOGLOBIN A1C: Hgb A1c MFr Bld: 5.7 % (ref 4.6–6.5)

## 2018-09-19 LAB — TSH: TSH: 0.66 u[IU]/mL (ref 0.35–4.50)

## 2018-09-19 NOTE — Telephone Encounter (Signed)
Left voicemail for pt to call the office to setup a virtual visit with Dr. Nicki Reaper tomorrow (09/20/18) to discuss labs drawn today. Pt physical was rescheduled but she needs a visit to discuss results soon.  Please schedule & get email or a phone number for text message to set her up on either Webex or [email or web address]  Thanks

## 2018-09-20 ENCOUNTER — Ambulatory Visit: Payer: Medicare HMO

## 2018-09-20 ENCOUNTER — Encounter: Payer: Medicare HMO | Admitting: Internal Medicine

## 2018-09-21 ENCOUNTER — Other Ambulatory Visit: Payer: Self-pay

## 2018-09-21 ENCOUNTER — Ambulatory Visit
Admission: RE | Admit: 2018-09-21 | Discharge: 2018-09-21 | Disposition: A | Discharge status: Home / Self Care | Payer: Medicare HMO | Source: Ambulatory Visit | Attending: Otolaryngology | Admitting: Otolaryngology

## 2018-09-21 DIAGNOSIS — R221 Localized swelling, mass and lump, neck: Secondary | ICD-10-CM | POA: Diagnosis present

## 2018-09-26 DIAGNOSIS — M5033 Other cervical disc degeneration, cervicothoracic region: Secondary | ICD-10-CM | POA: Diagnosis not present

## 2018-09-26 DIAGNOSIS — M5412 Radiculopathy, cervical region: Secondary | ICD-10-CM | POA: Diagnosis not present

## 2018-09-26 DIAGNOSIS — M9901 Segmental and somatic dysfunction of cervical region: Secondary | ICD-10-CM | POA: Diagnosis not present

## 2018-09-26 DIAGNOSIS — M9903 Segmental and somatic dysfunction of lumbar region: Secondary | ICD-10-CM | POA: Diagnosis not present

## 2018-09-27 DIAGNOSIS — J328 Other chronic sinusitis: Secondary | ICD-10-CM | POA: Diagnosis not present

## 2018-09-27 DIAGNOSIS — R591 Generalized enlarged lymph nodes: Secondary | ICD-10-CM | POA: Diagnosis not present

## 2018-10-01 ENCOUNTER — Encounter: Payer: Self-pay | Admitting: *Deleted

## 2018-10-02 NOTE — Telephone Encounter (Signed)
Mailed unread my chart message to patient

## 2018-10-09 ENCOUNTER — Other Ambulatory Visit: Payer: Self-pay | Admitting: Internal Medicine

## 2018-10-10 DIAGNOSIS — M9903 Segmental and somatic dysfunction of lumbar region: Secondary | ICD-10-CM | POA: Diagnosis not present

## 2018-10-10 DIAGNOSIS — M5033 Other cervical disc degeneration, cervicothoracic region: Secondary | ICD-10-CM | POA: Diagnosis not present

## 2018-10-10 DIAGNOSIS — M9901 Segmental and somatic dysfunction of cervical region: Secondary | ICD-10-CM | POA: Diagnosis not present

## 2018-10-10 DIAGNOSIS — M5412 Radiculopathy, cervical region: Secondary | ICD-10-CM | POA: Diagnosis not present

## 2018-10-15 ENCOUNTER — Ambulatory Visit (INDEPENDENT_AMBULATORY_CARE_PROVIDER_SITE_OTHER): Payer: Medicare HMO | Admitting: Internal Medicine

## 2018-10-15 ENCOUNTER — Encounter: Payer: Self-pay | Admitting: Internal Medicine

## 2018-10-15 ENCOUNTER — Other Ambulatory Visit: Payer: Self-pay

## 2018-10-15 DIAGNOSIS — E7800 Pure hypercholesterolemia, unspecified: Secondary | ICD-10-CM

## 2018-10-15 DIAGNOSIS — R591 Generalized enlarged lymph nodes: Secondary | ICD-10-CM

## 2018-10-15 DIAGNOSIS — F439 Reaction to severe stress, unspecified: Secondary | ICD-10-CM

## 2018-10-15 DIAGNOSIS — K2100 Gastro-esophageal reflux disease with esophagitis, without bleeding: Secondary | ICD-10-CM

## 2018-10-15 DIAGNOSIS — E039 Hypothyroidism, unspecified: Secondary | ICD-10-CM | POA: Diagnosis not present

## 2018-10-15 DIAGNOSIS — R69 Illness, unspecified: Secondary | ICD-10-CM | POA: Diagnosis not present

## 2018-10-15 NOTE — Progress Notes (Signed)
Patient ID: [name], female   DOB: 1945-08-19, 73 y.o.   MRN: [id number] Virtual Visit via Telephone Note  This visit type was conducted due to national recommendations for restrictions regarding the COVID-19 pandemic (e.g. social distancing).  This format is felt to be most appropriate for this patient at this time.  All issues noted in this document were discussed and addressed.  No physical exam was performed (except for noted visual exam findings with Video Visits).   I connected with Brynda Greathouse on 10/15/18 at 11:30 AM EDT by telephone and verified that I am speaking with the correct person using two identifiers. Location patient: home Location provider: work Persons participating in the virtual visit: patient, provider  I discussed the limitations, risks, security and privacy concerns of performing an evaluation and management service by telephone and the availability of in person appointments. The patient expressed understanding and agreed to proceed.   Reason for visit: scheduled follow up.   HPI: She went to [place] 05/17/18.  Just prior to leaving, she developed sore throat, etc.  Treated with augmentin.  Initially improved.  Symptoms returned/worsened - fever, congestion - chest and hoarseness.  Reevaluated and treated with zpak.  Respiratory symptoms have improved - resolved.  Still with persistent right neck  - lymphadenopathy.  Saw ENT.  Had CT.  Treated with budesonide and levofloxacin rinse. Symptoms have improved significantly.  No significant symptoms now.  No fever.  No sinus pressure.  No nasal congestion or sore throat.  Had f/u neck ultrasound which revealed persistent mild cervical lymphadenopathy with unchanged size of the dominant right nodule.  States elected to monitor. Has f/ planned 7-8 2020.  No headache.  Trying to stay active.  No chest pain.  No sob.  No cough or congestion.  No acid reflux.  Controlled.  No abdominal pain.  Bowels moving.  Handling stress.   Has started exercising.  Overall she feels she is doing relatively well.     ROS: See pertinent positives and negatives per HPI.  Past Medical History:  Diagnosis Date  . Arthritis   . Environmental allergies   . Esophagitis    [place], followed by Dr Vennie Homans  . GERD (gastroesophageal reflux disease)   . Hypercholesterolemia   . Hypothyroidism     Past Surgical History:  Procedure Laterality Date  . [place]  . KNEE ARTHROSCOPY  12/03/07   left  . KNEE ARTHROSCOPY  06/25/08   left  . NASAL SINUS SURGERY  12/06  . REPLACEMENT TOTAL KNEE  01/04/09   left  . RHINOPLASTY  1986  . VAGINAL HYSTERECTOMY  1980   ovaries left in place  . WRIST SURGERY  1970   cyst removal    Family History  Problem Relation Age of Onset  . Heart disease Father        heart attack  . Diabetes Father   . Hyperlipidemia Paternal Grandmother   . Diabetes Paternal Grandmother   . Stroke Paternal Grandmother   . Hyperlipidemia Paternal Grandfather   . Diabetes Paternal Grandfather   . Prostate cancer Maternal Uncle   . Pancreatic cancer Cousin   . Multiple myeloma Brother   . Breast cancer Maternal Aunt   . Breast cancer Maternal Aunt     SOCIAL HX: reviewed.    Current Outpatient Medications:  .  acetaminophen (TYLENOL) 500 MG tablet, Take 500 mg by mouth every 6 (six) hours as needed., Disp: , Rfl:  .  Ascorbic Acid (VITAMIN C) 1000 MG tablet, Take 1,000 mg by mouth daily., Disp: , Rfl:  .  BIOTIN 5000 PO, Take 1 tablet by mouth daily., Disp: , Rfl:  .  busPIRone (BUSPAR) 5 MG tablet, Take 1 tablet (5 mg total) by mouth 2 (two) times daily., Disp: 180 tablet, Rfl: 1 .  Cholecalciferol (VITAMIN D PO), Take 5,000 Units by mouth daily., Disp: , Rfl:  .  Coenzyme Q10 60 MG TABS, Take 120 mg by mouth daily., Disp: , Rfl:  .  diclofenac sodium (VOLTAREN) 1 % GEL, Apply 1 application topically as needed., Disp: , Rfl:  .  fluticasone (FLONASE) 50 MCG/ACT nasal spray, Place 2 sprays  into both nostrils daily., Disp: 16 g, Rfl: 6 .  Hypertonic Nasal Wash (SINUS RINSE NA), Place into the nose as needed., Disp: , Rfl:  .  levothyroxine (LEVOTHROID) 75 MCG tablet, Take 1 tablet (75 mcg total) by mouth every other day., Disp: 90 tablet, Rfl: 1 .  levothyroxine (SYNTHROID, LEVOTHROID) 50 MCG tablet, TAKE 1 TABLET EVERY OTHER  DAY, Disp: 45 tablet, Rfl: 3 .  MAGNESIUM PO, Take 1 tablet by mouth., Disp: , Rfl:  .  Multiple Vitamin (MULTIVITAMIN) LIQD, Take 5 mLs by mouth daily., Disp: , Rfl:  .  Omega-3 Fatty Acids (FISH OIL) 1360 MG CAPS, Take by mouth daily., Disp: , Rfl:  .  omeprazole (PRILOSEC) 40 MG capsule, Take 40 mg by mouth daily., Disp: , Rfl:  .  Probiotic Product (PROBIOTIC DAILY PO), Take 1 capsule by mouth., Disp: , Rfl:  .  rosuvastatin (CRESTOR) 5 MG tablet, TAKE 1 TABLET DAILY, Disp: 90 tablet, Rfl: 1 .  vitamin E 400 UNIT capsule, Take 400 Units by mouth daily., Disp: , Rfl:  .  acyclovir (ZOVIRAX) 800 MG tablet, Take 1 tablet (800 mg total) by mouth 2 (two) times daily. As directed. (Patient not taking: Reported on 10/15/2018), Disp: 60 tablet, Rfl: 0 .  amoxicillin-clavulanate (AUGMENTIN) 875-125 MG tablet, Take 1 tablet by mouth 2 (two) times daily. (Patient not taking: Reported on 12/21/2017), Disp: 20 tablet, Rfl: 0 .  benzonatate (TESSALON) 100 MG capsule, PLEASE SEE ATTACHED FOR DETAILED DIRECTIONS, Disp: , Rfl:  .  budesonide (PULMICORT) 1 MG/2ML nebulizer solution, ADD 1ML OF MEDICATION TO 240ML OF SALINE IN SALINE IRRIGATION BOTTLE; IRRIGATE SINUSES WITH 120ML THROUGH EACH NOSTRIL TWICE DAILY, Disp: , Rfl:  .  carbamide peroxide (DEBROX) 6.5 % OTIC solution, 3-4 drops in right ear daily.  Massage for approximately 5 minutes (Patient not taking: Reported on 10/15/2018), Disp: 15 mL, Rfl: 0 .  TOLAK 4 % CREA, Apply daily from knees down, Disp: , Rfl: 0 .  triamcinolone cream (KENALOG) 0.1 %, PLEASE SEE ATTACHED FOR DETAILED DIRECTIONS, Disp: , Rfl:    EXAM:  GENERAL: alert, oriented, appears well and in no acute distress  HEENT: atraumatic, conjunttiva clear, no obvious abnormalities on inspection of external nose and ears  NECK: normal movements of the head and neck  LUNGS: on inspection no signs of respiratory distress, breathing rate appears normal, no obvious gross SOB, gasping or wheezing  CV: no obvious cyanosis  PSYCH/NEURO: pleasant and cooperative, no obvious depression or anxiety, speech and thought processing grossly intact  ASSESSMENT AND PLAN:  Discussed the following assessment and plan:  Gastroesophageal reflux disease with esophagitis  Hypercholesteremia  Hypothyroidism, unspecified type  Stress  Lymphadenopathy  GERD (gastroesophageal reflux disease) Had EGD 01/2018.  Reflux controlled.  Continue current medication regimen.  Hypercholesteremia On crestor.  Low cholesterol diet and exercise.  Follow lipid panel and liver function tests.   Hypothyroidism On thyroid replacement.  Follow tsh.   Stress Doing better.  Follow.   Lymphadenopathy Persistent, but unchanged.  Had previous CT and recent ultrasound.  Unchanged.  Being followed by ENT.  Congestion symptoms resolved.      I discussed the assessment and treatment plan with the patient. The patient was provided an opportunity to ask questions and all were answered. The patient agreed with the plan and demonstrated an understanding of the instructions.   The patient was advised to call back or seek an in-person evaluation if the symptoms worsen or if the condition fails to improve as anticipated.  I provided 25 minutes of non-face-to-face time during this encounter.   Einar Pheasant, MD

## 2018-10-17 ENCOUNTER — Encounter: Payer: Self-pay | Admitting: Internal Medicine

## 2018-10-17 DIAGNOSIS — R591 Generalized enlarged lymph nodes: Secondary | ICD-10-CM | POA: Insufficient documentation

## 2018-10-17 NOTE — Assessment & Plan Note (Signed)
Had EGD 01/2018.  Reflux controlled.  Continue current medication regimen.

## 2018-10-17 NOTE — Assessment & Plan Note (Signed)
On crestor.  Low cholesterol diet and exercise.  Follow lipid panel and liver function tests.   

## 2018-10-17 NOTE — Assessment & Plan Note (Signed)
Persistent, but unchanged.  Had previous CT and recent ultrasound.  Unchanged.  Being followed by ENT.  Congestion symptoms resolved.

## 2018-10-17 NOTE — Assessment & Plan Note (Signed)
On thyroid replacement.  Follow tsh.  

## 2018-10-17 NOTE — Assessment & Plan Note (Signed)
Doing better.  Follow.   

## 2018-10-24 DIAGNOSIS — M9901 Segmental and somatic dysfunction of cervical region: Secondary | ICD-10-CM | POA: Diagnosis not present

## 2018-10-24 DIAGNOSIS — M9903 Segmental and somatic dysfunction of lumbar region: Secondary | ICD-10-CM | POA: Diagnosis not present

## 2018-10-24 DIAGNOSIS — M5412 Radiculopathy, cervical region: Secondary | ICD-10-CM | POA: Diagnosis not present

## 2018-10-24 DIAGNOSIS — M5033 Other cervical disc degeneration, cervicothoracic region: Secondary | ICD-10-CM | POA: Diagnosis not present

## 2018-11-07 DIAGNOSIS — M5033 Other cervical disc degeneration, cervicothoracic region: Secondary | ICD-10-CM | POA: Diagnosis not present

## 2018-11-07 DIAGNOSIS — M9901 Segmental and somatic dysfunction of cervical region: Secondary | ICD-10-CM | POA: Diagnosis not present

## 2018-11-07 DIAGNOSIS — M9903 Segmental and somatic dysfunction of lumbar region: Secondary | ICD-10-CM | POA: Diagnosis not present

## 2018-11-07 DIAGNOSIS — M5412 Radiculopathy, cervical region: Secondary | ICD-10-CM | POA: Diagnosis not present

## 2018-11-15 DIAGNOSIS — M79672 Pain in left foot: Secondary | ICD-10-CM | POA: Diagnosis not present

## 2018-11-15 DIAGNOSIS — M19072 Primary osteoarthritis, left ankle and foot: Secondary | ICD-10-CM | POA: Diagnosis not present

## 2018-11-15 DIAGNOSIS — M79671 Pain in right foot: Secondary | ICD-10-CM | POA: Diagnosis not present

## 2018-11-15 DIAGNOSIS — M19071 Primary osteoarthritis, right ankle and foot: Secondary | ICD-10-CM | POA: Diagnosis not present

## 2018-11-20 DIAGNOSIS — I8312 Varicose veins of left lower extremity with inflammation: Secondary | ICD-10-CM | POA: Diagnosis not present

## 2018-11-20 DIAGNOSIS — D225 Melanocytic nevi of trunk: Secondary | ICD-10-CM | POA: Diagnosis not present

## 2018-11-20 DIAGNOSIS — L820 Inflamed seborrheic keratosis: Secondary | ICD-10-CM | POA: Diagnosis not present

## 2018-11-20 DIAGNOSIS — L570 Actinic keratosis: Secondary | ICD-10-CM | POA: Diagnosis not present

## 2018-11-20 DIAGNOSIS — L821 Other seborrheic keratosis: Secondary | ICD-10-CM | POA: Diagnosis not present

## 2018-11-20 DIAGNOSIS — L565 Disseminated superficial actinic porokeratosis (DSAP): Secondary | ICD-10-CM | POA: Diagnosis not present

## 2018-11-20 DIAGNOSIS — L438 Other lichen planus: Secondary | ICD-10-CM | POA: Diagnosis not present

## 2018-11-20 DIAGNOSIS — D1801 Hemangioma of skin and subcutaneous tissue: Secondary | ICD-10-CM | POA: Diagnosis not present

## 2018-11-20 DIAGNOSIS — I8311 Varicose veins of right lower extremity with inflammation: Secondary | ICD-10-CM | POA: Diagnosis not present

## 2018-11-20 DIAGNOSIS — L814 Other melanin hyperpigmentation: Secondary | ICD-10-CM | POA: Diagnosis not present

## 2018-11-27 DIAGNOSIS — M9903 Segmental and somatic dysfunction of lumbar region: Secondary | ICD-10-CM | POA: Diagnosis not present

## 2018-11-27 DIAGNOSIS — H5203 Hypermetropia, bilateral: Secondary | ICD-10-CM | POA: Diagnosis not present

## 2018-11-27 DIAGNOSIS — H2513 Age-related nuclear cataract, bilateral: Secondary | ICD-10-CM | POA: Diagnosis not present

## 2018-11-27 DIAGNOSIS — M5412 Radiculopathy, cervical region: Secondary | ICD-10-CM | POA: Diagnosis not present

## 2018-11-27 DIAGNOSIS — H04123 Dry eye syndrome of bilateral lacrimal glands: Secondary | ICD-10-CM | POA: Diagnosis not present

## 2018-11-27 DIAGNOSIS — Z0100 Encounter for examination of eyes and vision without abnormal findings: Secondary | ICD-10-CM | POA: Diagnosis not present

## 2018-11-27 DIAGNOSIS — M5033 Other cervical disc degeneration, cervicothoracic region: Secondary | ICD-10-CM | POA: Diagnosis not present

## 2018-11-27 DIAGNOSIS — M9901 Segmental and somatic dysfunction of cervical region: Secondary | ICD-10-CM | POA: Diagnosis not present

## 2018-12-10 ENCOUNTER — Ambulatory Visit: Payer: Medicare HMO

## 2018-12-20 DIAGNOSIS — R69 Illness, unspecified: Secondary | ICD-10-CM | POA: Diagnosis not present

## 2018-12-31 DIAGNOSIS — M19072 Primary osteoarthritis, left ankle and foot: Secondary | ICD-10-CM | POA: Diagnosis not present

## 2019-01-01 DIAGNOSIS — M5412 Radiculopathy, cervical region: Secondary | ICD-10-CM | POA: Diagnosis not present

## 2019-01-01 DIAGNOSIS — M9903 Segmental and somatic dysfunction of lumbar region: Secondary | ICD-10-CM | POA: Diagnosis not present

## 2019-01-01 DIAGNOSIS — M9901 Segmental and somatic dysfunction of cervical region: Secondary | ICD-10-CM | POA: Diagnosis not present

## 2019-01-01 DIAGNOSIS — M5033 Other cervical disc degeneration, cervicothoracic region: Secondary | ICD-10-CM | POA: Diagnosis not present

## 2019-01-15 DIAGNOSIS — M5412 Radiculopathy, cervical region: Secondary | ICD-10-CM | POA: Diagnosis not present

## 2019-01-15 DIAGNOSIS — M9903 Segmental and somatic dysfunction of lumbar region: Secondary | ICD-10-CM | POA: Diagnosis not present

## 2019-01-15 DIAGNOSIS — M9901 Segmental and somatic dysfunction of cervical region: Secondary | ICD-10-CM | POA: Diagnosis not present

## 2019-01-15 DIAGNOSIS — M5033 Other cervical disc degeneration, cervicothoracic region: Secondary | ICD-10-CM | POA: Diagnosis not present

## 2019-01-22 DIAGNOSIS — M5033 Other cervical disc degeneration, cervicothoracic region: Secondary | ICD-10-CM | POA: Diagnosis not present

## 2019-01-22 DIAGNOSIS — R591 Generalized enlarged lymph nodes: Secondary | ICD-10-CM | POA: Diagnosis not present

## 2019-01-22 DIAGNOSIS — J324 Chronic pansinusitis: Secondary | ICD-10-CM | POA: Diagnosis not present

## 2019-01-22 DIAGNOSIS — H6121 Impacted cerumen, right ear: Secondary | ICD-10-CM | POA: Diagnosis not present

## 2019-01-22 DIAGNOSIS — M9901 Segmental and somatic dysfunction of cervical region: Secondary | ICD-10-CM | POA: Diagnosis not present

## 2019-01-22 DIAGNOSIS — M5412 Radiculopathy, cervical region: Secondary | ICD-10-CM | POA: Diagnosis not present

## 2019-01-22 DIAGNOSIS — M9903 Segmental and somatic dysfunction of lumbar region: Secondary | ICD-10-CM | POA: Diagnosis not present

## 2019-01-24 ENCOUNTER — Other Ambulatory Visit: Payer: Self-pay | Admitting: Otolaryngology

## 2019-01-24 DIAGNOSIS — R590 Localized enlarged lymph nodes: Secondary | ICD-10-CM

## 2019-01-29 ENCOUNTER — Other Ambulatory Visit: Payer: Self-pay | Admitting: Internal Medicine

## 2019-01-29 ENCOUNTER — Other Ambulatory Visit: Payer: Self-pay | Admitting: Radiology

## 2019-01-29 DIAGNOSIS — Z1231 Encounter for screening mammogram for malignant neoplasm of breast: Secondary | ICD-10-CM

## 2019-01-30 ENCOUNTER — Other Ambulatory Visit: Payer: Self-pay | Admitting: Otolaryngology

## 2019-01-30 ENCOUNTER — Ambulatory Visit
Admission: RE | Admit: 2019-01-30 | Discharge: 2019-01-30 | Disposition: A | Discharge status: Home / Self Care | Payer: Medicare HMO | Source: Ambulatory Visit | Attending: Otolaryngology | Admitting: Otolaryngology

## 2019-01-30 ENCOUNTER — Other Ambulatory Visit: Payer: Self-pay

## 2019-01-30 DIAGNOSIS — R590 Localized enlarged lymph nodes: Secondary | ICD-10-CM | POA: Insufficient documentation

## 2019-01-30 MED ORDER — SODIUM CHLORIDE 0.9 % IV SOLN: INTRAVENOUS | Status: DC

## 2019-01-30 NOTE — OR Nursing (Signed)
Procedure biopsy not done due to size of lymph node.

## 2019-01-30 NOTE — Discharge Instructions (Signed)
Needle Biopsy, Care After Use ice pack as needed to biopsy site to decrease swelling and pain   This sheet gives you information about how to care for yourself after your procedure. Your health care provider may also give you more specific instructions. If you have problems or questions, contact your health care provider. What can I expect after the procedure? After the procedure, it is common to have soreness, bruising, or mild pain at the puncture site. This should go away in a few days. Follow these instructions at home: Needle insertion site care   Wash your hands with soap and water before you change your bandage (dressing). If you cannot use soap and water, use hand sanitizer.  Follow instructions from your health care provider about how to take care of your puncture site. This includes: ? When and how to change your dressing. ? When to remove your dressing.  Check your puncture site every day for signs of infection. Check for: ? Redness, swelling, or pain. ? Fluid or blood. ? Pus or a bad smell. ? Warmth. General instructions  Return to your normal activities as told by your health care provider. Ask your health care provider what activities are safe for you.  Do not take baths, swim, or use a hot tub until your health care provider approves. Ask your health care provider if you may take showers. You may only be allowed to take sponge baths.  Take over-the-counter and prescription medicines only as told by your health care provider.  Keep all follow-up visits as told by your health care provider. This is important. Contact a health care provider if:  You have a fever.  You have redness, swelling, or pain at the puncture site that lasts longer than a few days.  You have fluid, blood, or pus coming from your puncture site.  Your puncture site feels warm to the touch. Get help right away if:  You have severe bleeding from the puncture site. Summary  After the  procedure, it is common to have soreness, bruising, or mild pain at the puncture site. This should go away in a few days.  Check your puncture site every day for signs of infection, such as redness, swelling, or pain.  Get help right away if you have severe bleeding from your puncture site. This information is not intended to replace advice given to you by your health care provider. Make sure you discuss any questions you have with your health care provider. Document Released: 10/14/2014 Document Revised: 08/11/2017 Document Reviewed: 06/12/2017 Elsevier Patient Education  2020 [employer].

## 2019-01-30 NOTE — OR Nursing (Signed)
Pt not on blood thinners. Pt and dr Anselm Pancoast opted to not do moderate sedation. Labs and IV orders discontinued

## 2019-02-13 DIAGNOSIS — M9903 Segmental and somatic dysfunction of lumbar region: Secondary | ICD-10-CM | POA: Diagnosis not present

## 2019-02-13 DIAGNOSIS — M5033 Other cervical disc degeneration, cervicothoracic region: Secondary | ICD-10-CM | POA: Diagnosis not present

## 2019-02-13 DIAGNOSIS — M5412 Radiculopathy, cervical region: Secondary | ICD-10-CM | POA: Diagnosis not present

## 2019-02-13 DIAGNOSIS — M9901 Segmental and somatic dysfunction of cervical region: Secondary | ICD-10-CM | POA: Diagnosis not present

## 2019-02-14 ENCOUNTER — Telehealth: Payer: Self-pay

## 2019-02-14 DIAGNOSIS — L820 Inflamed seborrheic keratosis: Secondary | ICD-10-CM | POA: Diagnosis not present

## 2019-02-14 DIAGNOSIS — D485 Neoplasm of uncertain behavior of skin: Secondary | ICD-10-CM | POA: Diagnosis not present

## 2019-02-14 DIAGNOSIS — L570 Actinic keratosis: Secondary | ICD-10-CM | POA: Diagnosis not present

## 2019-02-14 NOTE — Telephone Encounter (Signed)
Lm to call back and go over travel screening

## 2019-02-14 NOTE — Telephone Encounter (Signed)
Copied from [place] [id number]. Topic: General - Call Back - No Documentation >> Feb 14, 2019 10:52 AM Reyne Dumas L wrote: Reason for CRM:  Pt states she is returning a call to [place]. Pt can be reached at [phone]

## 2019-02-19 ENCOUNTER — Ambulatory Visit (INDEPENDENT_AMBULATORY_CARE_PROVIDER_SITE_OTHER): Payer: Medicare HMO | Admitting: Internal Medicine

## 2019-02-19 ENCOUNTER — Ambulatory Visit (INDEPENDENT_AMBULATORY_CARE_PROVIDER_SITE_OTHER): Payer: Medicare HMO

## 2019-02-19 ENCOUNTER — Other Ambulatory Visit: Payer: Self-pay

## 2019-02-19 VITALS — BP 116/70 | HR 66 | Temp 97.7°F | Resp 16 | Ht 64.0 in | Wt 144.2 lb

## 2019-02-19 DIAGNOSIS — K2100 Gastro-esophageal reflux disease with esophagitis, without bleeding: Secondary | ICD-10-CM

## 2019-02-19 DIAGNOSIS — R2230 Localized swelling, mass and lump, unspecified upper limb: Secondary | ICD-10-CM | POA: Insufficient documentation

## 2019-02-19 DIAGNOSIS — Z23 Encounter for immunization: Secondary | ICD-10-CM | POA: Diagnosis not present

## 2019-02-19 DIAGNOSIS — Z0001 Encounter for general adult medical examination with abnormal findings: Secondary | ICD-10-CM

## 2019-02-19 DIAGNOSIS — M25552 Pain in left hip: Secondary | ICD-10-CM | POA: Diagnosis not present

## 2019-02-19 DIAGNOSIS — R591 Generalized enlarged lymph nodes: Secondary | ICD-10-CM | POA: Diagnosis not present

## 2019-02-19 DIAGNOSIS — M5136 Other intervertebral disc degeneration, lumbar region: Secondary | ICD-10-CM | POA: Diagnosis not present

## 2019-02-19 DIAGNOSIS — R739 Hyperglycemia, unspecified: Secondary | ICD-10-CM | POA: Diagnosis not present

## 2019-02-19 DIAGNOSIS — R222 Localized swelling, mass and lump, trunk: Secondary | ICD-10-CM | POA: Diagnosis not present

## 2019-02-19 DIAGNOSIS — E039 Hypothyroidism, unspecified: Secondary | ICD-10-CM

## 2019-02-19 DIAGNOSIS — E7800 Pure hypercholesterolemia, unspecified: Secondary | ICD-10-CM

## 2019-02-19 DIAGNOSIS — Z0000 Encounter for general adult medical examination without abnormal findings: Secondary | ICD-10-CM

## 2019-02-19 DIAGNOSIS — M47816 Spondylosis without myelopathy or radiculopathy, lumbar region: Secondary | ICD-10-CM | POA: Diagnosis not present

## 2019-02-19 NOTE — Assessment & Plan Note (Addendum)
Physical today 02/19/19.  Mammogram 03/14/18 - [facility] I.  Scheduled f/u mammogram (diagnostic) as outlined.  Colonoscopy 04/2013. Recommended f/u in 5 years.  Had f/u colonoscopy 01/15/18.  Recommended f/u in 5 years.

## 2019-02-19 NOTE — Progress Notes (Signed)
Subjective:   [name] is a 73 y.o. female who presents for Medicare Annual (Subsequent) preventive examination.  Review of Systems:  No ROS.  Medicare Wellness Virtual Visit.  Visual/audio telehealth visit, UTA vital signs.   See social history for additional risk factors.   Cardiac Risk Factors include: advanced age (>58mn, >>37women)     Objective:     Vitals: There were no vitals taken for this visit.  There is no height or weight on file to calculate BMI.  Advanced Directives 02/19/2019 09/18/2017 08/02/2016 05/27/2015  Does Patient Have a Medical Advance Directive? Yes Yes Yes Yes  Type of Advance Directive Living will;Healthcare Power of [place]ates[place] will Living will;[place]care [place] of [place]  Does patient want to make changes to medical advance directive? No - Patient declined No - Patient declined No - Patient declined No - Patient declined  Copy of [place]? Yes - validated most recent copy scanned in chart (See row information) No - copy requested No - copy requested No - copy requested    Tobacco Social History   Tobacco Use  Smoking Status Former Smoker  . Years: 5.00  . Types: Cigarettes  Smokeless Tobacco Never Used     Counseling given: Not Answered   Clinical Intake:  Pre-visit preparation completed: Yes        Diabetes: No  How often do you need to have someone help you when you read instructions, pamphlets, or other written materials from your doctor or pharmacy?: 1 - Never  Interpreter Needed?: No     Past Medical History:  Diagnosis Date  . Arthritis   . Environmental allergies   . Esophagitis    [place], followed by Dr NVennie Homans . GERD (gastroesophageal reflux disease)   . Hypercholesterolemia   . Hypothyroidism    Past Surgical History:  Procedure Laterality Date  . [place] . KNEE ARTHROSCOPY  12/03/07   left  . KNEE ARTHROSCOPY  06/25/08   left  . NASAL SINUS SURGERY  12/06  . REPLACEMENT TOTAL KNEE  01/04/09   left  . RHINOPLASTY  1986  . VAGINAL HYSTERECTOMY  1980   ovaries left in place  . WRIST SURGERY  1970   cyst removal   Family History  Problem Relation Age of Onset  . Heart disease Father        heart attack  . Diabetes Father   . Hyperlipidemia Paternal Grandmother   . Diabetes Paternal Grandmother   . Stroke Paternal Grandmother   . Hyperlipidemia Paternal Grandfather   . Diabetes Paternal Grandfather   . Prostate cancer Maternal Uncle   . Pancreatic cancer Cousin   . Multiple myeloma Brother   . Breast cancer Maternal Aunt   . Breast cancer Maternal Aunt    Social History   Socioeconomic History  . Marital status: Married    Spouse name: Not on file  . Number of children: Not on file  . Years of education: Not on file  . Highest education level: Not on file  Occupational History  . Not on file  Social Needs  . Financial resource strain: Not hard at all  . Food insecurity    Worry: Never true    Inability: Never true  . Transportation needs    Medical: No    Non-medical: No  Tobacco Use  . Smoking status: Former Smoker  Years: 5.00    Types: Cigarettes  . Smokeless tobacco: Never Used  Substance and Sexual Activity  . Alcohol use: Yes    Alcohol/week: 0.0 standard drinks    Comment: rare  . Drug use: No  . Sexual activity: Yes  Lifestyle  . Physical activity    Days per week: 3 days    Minutes per session: 30 min  . Stress: Not at all  Relationships  . Social Herbalist on phone: Not on file    Gets together: Not on file    Attends religious service: Not on file    Active member of club or organization: Not on file    Attends meetings of clubs or organizations: Not on file    Relationship status: Not on file  Other Topics Concern  . Not on file  Social History Narrative  . Not on file    Outpatient Encounter Medications  as of 02/19/2019  Medication Sig  . levothyroxine (LEVOTHROID) 75 MCG tablet Take 1 tablet (75 mcg total) by mouth every other day.  . levothyroxine (SYNTHROID, LEVOTHROID) 50 MCG tablet TAKE 1 TABLET EVERY OTHER  DAY  . omeprazole (PRILOSEC) 40 MG capsule Take 40 mg by mouth daily.  . rosuvastatin (CRESTOR) 5 MG tablet TAKE 1 TABLET DAILY  . acetaminophen (TYLENOL) 500 MG tablet Take 500 mg by mouth every 6 (six) hours as needed.  Marland Kitchen acyclovir (ZOVIRAX) 800 MG tablet Take 1 tablet (800 mg total) by mouth 2 (two) times daily. As directed. (Patient not taking: Reported on 10/15/2018)  . amoxicillin-clavulanate (AUGMENTIN) 875-125 MG tablet Take 1 tablet by mouth 2 (two) times daily. (Patient not taking: Reported on 12/21/2017)  . Ascorbic Acid (VITAMIN C) 1000 MG tablet Take 1,000 mg by mouth daily.  Marland Kitchen BIOTIN 5000 PO Take 1 tablet by mouth daily.  . budesonide (PULMICORT) 1 MG/2ML nebulizer solution ADD 1ML OF MEDICATION TO 240ML OF SALINE IN SALINE IRRIGATION BOTTLE; IRRIGATE SINUSES WITH 120ML THROUGH EACH NOSTRIL TWICE DAILY  . busPIRone (BUSPAR) 5 MG tablet Take 1 tablet (5 mg total) by mouth 2 (two) times daily. (Patient not taking: Reported on 01/30/2019)  . carbamide peroxide (DEBROX) 6.5 % OTIC solution 3-4 drops in right ear daily.  Massage for approximately 5 minutes (Patient not taking: Reported on 10/15/2018)  . Cholecalciferol (VITAMIN D PO) Take 5,000 Units by mouth daily.  . Coenzyme Q10 60 MG TABS Take 120 mg by mouth daily.  . diclofenac sodium (VOLTAREN) 1 % GEL Apply 1 application topically as needed.  . fluticasone (FLONASE) 50 MCG/ACT nasal spray Place 2 sprays into both nostrils daily.  . Hypertonic Nasal Wash (SINUS RINSE NA) Place into the nose as needed.  Marland Kitchen MAGNESIUM PO Take 1 tablet by mouth.  . Multiple Vitamin (MULTIVITAMIN) LIQD Take 5 mLs by mouth daily.  . Omega-3 Fatty Acids (FISH OIL) 1360 MG CAPS Take by mouth daily.  . Probiotic Product (PROBIOTIC DAILY PO) Take 1  capsule by mouth.  . TOLAK 4 % CREA Apply daily from knees down  . triamcinolone cream (KENALOG) 0.1 % PLEASE SEE ATTACHED FOR DETAILED DIRECTIONS  . vitamin E 400 UNIT capsule Take 400 Units by mouth daily.  . [DISCONTINUED] benzonatate (TESSALON) 100 MG capsule PLEASE SEE ATTACHED FOR DETAILED DIRECTIONS   No facility-administered encounter medications on file as of 02/19/2019.     Activities of Daily Living In your present state of health, do you have any difficulty performing the following activities:  02/19/2019  Hearing? N  Vision? N  Difficulty concentrating or making decisions? N  Walking or climbing stairs? N  Dressing or bathing? N  Doing errands, shopping? N  Preparing Food and eating ? N  Using the Toilet? N  In the past six months, have you accidently leaked urine? N  Do you have problems with loss of bowel control? N  Managing your Medications? N  Managing your Finances? N  Housekeeping or managing your Housekeeping? N  Some recent data might be hidden    Patient Care Team: Einar Pheasant, MD as PCP - General (Internal Medicine)    Assessment:   This is a routine wellness examination for Beatric.  I connected with patient 02/19/19 at 11:30 AM EDT by an audio enabled telemedicine application and verified that I am speaking with the correct person using two identifiers. Patient stated full name and DOB. Patient gave permission to continue with virtual visit. Patient's location was at home and Nurse's location was at [place] office.   Health Maintenance Due: Influenza vaccine 2020- discussed; to be completed in office with her doctor today.    Mammogram- scheduled 03/18/19 Update all pending maintenance due as appropriate.   See completed HM at the end of note.   Eye: Visual acuity not assessed. Virtual visit. Wears corrective lenses. Followed by their ophthalmologist every 12 months.   Dental: Visits every 6 months.    Hearing: Demonstrates normal hearing during  visit.  Safety:  Patient feels safe at home- yes Patient does have smoke detectors at home- yes Patient does wear sunscreen or protective clothing when in direct sunlight - yes Patient does wear seat belt when in a moving vehicle - yes Patient drives- yes Adequate lighting in walkways free from debris- yes Grab bars and handrails used as appropriate- yes Cell phone on person when ambulating outside of the home- yes  Social: Alcohol intake - yes, rare Smoking history- former    Smokers in home? none Illicit drug use? none  Depression: PHQ 2 &9 complete. See screening below. Denies irritability, anhedonia, sadness/tearfullness.  Stable.   Falls: See screening below.    Medication: Taking as directed and without issues.  Taking CBD oil periodically.  Covid-19: Precautions and sickness symptoms discussed. Wears mask, social distancing, hand hygiene as appropriate.   Activities of Daily Living Patient denies needing assistance with: household chores, feeding themselves, getting from bed to chair, getting to the toilet, bathing/showering, dressing, managing money, or preparing meals.   Memory: Patient is alert. Patient denies difficulty focusing or concentrating. Correctly identified the president of the [place], season and recall. Patient likes to read for brain stimulation.  BMI- discussed the importance of a healthy diet, water intake and the benefits of aerobic exercise.  Educational material provided.  Physical activity- home gym 3 days weekly, 30 minutes  Diet:  Regular Water: good intake Caffeine: no  Advanced Directive: End of life planning; Advance aging; Advanced directives discussed.  Copy of current HCPOA/Living Will on file.   Other Providers Patient Care Team: Einar Pheasant, MD as PCP - General (Internal Medicine) Exercise Activities and Dietary recommendations Current Exercise Habits: Home exercise routine, Type of exercise: walking, Time (Minutes): 30,  Frequency (Times/Week): 3, Weekly Exercise (Minutes/Week): 90  Goals    . Follow up with Provider as scheduled       Fall Risk Fall Risk  02/19/2019 09/18/2017 06/19/2017 12/16/2016 08/02/2016  Falls in the past year? 0 No No No No   Timed Get Up  and Go performed: no, virtual visit  Depression Screen PHQ 2/9 Scores 02/19/2019 09/18/2017 06/19/2017 12/16/2016  PHQ - 2 Score 0 0 0 0     Cognitive Function MMSE - Mini Mental State Exam 09/18/2017 08/02/2016 05/27/2015  Orientation to time 5 5 5   Orientation to Place 5 5 5   Registration 3 3 3   Attention/ Calculation 5 5 5   Recall 3 3 3   Language- name 2 objects 2 2 2   Language- repeat 1 1 1   Language- follow 3 step command 3 3 3   Language- read & follow direction 1 1 1   Write a sentence 1 1 1   Copy design 1 1 1   Total score 30 30 30      6CIT Screen 02/19/2019  What Year? 0 points  What month? 0 points  What time? 0 points  Count back from 20 0 points  Months in reverse 0 points  Repeat phrase 0 points  Total Score 0    Immunization History  Administered Date(s) Administered  . Influenza Split 03/26/2012  . Influenza, High Dose Seasonal PF 03/07/2017, 03/09/2018  . Influenza,inj,Quad PF,6+ Mos 02/25/2013, 02/14/2014, 03/26/2015, 02/02/2016  . Pneumococcal Conjugate-13 04/22/2015  . Pneumococcal Polysaccharide-23 01/24/2011  . Tdap 11/17/2015   Screening Tests Health Maintenance  Topic Date Due  . INFLUENZA VACCINE  01/12/2019  . MAMMOGRAM  03/15/2019  . COLONOSCOPY  01/16/2023  . TETANUS/TDAP  11/16/2025  . DEXA SCAN  Completed  . Hepatitis C Screening  Completed  . PNA vac Low Risk Adult  Completed       Plan:   Keep all routine maintenance appointments.   Mammogram scheduled 10/5 2020  Cpe today at 3:00 with pcp  Medicare Attestation I have personally reviewed: The patient's medical and social history Their use of alcohol, tobacco or illicit drugs Their current medications and supplements The patient's functional  ability including ADLs,fall risks, home safety risks, cognitive, and hearing and visual impairment Diet and physical activities Evidence for depression        OBrien-Blaney, Willmer Fellers L, LPN  9/0/2409   Reviewed above information.  Agree with assessment and plan.    Dr Nicki Reaper

## 2019-02-19 NOTE — Progress Notes (Signed)
Patient ID: [name], female   DOB: Apr 18, 1946, 73 y.o.   MRN: [id number]   Subjective:    Patient ID: [name], female    DOB: Jul 27, 1945, 73 y.o.   MRN: [id number]  HPI  Patient here for her physical exam.  She reports she is doing relatively well.  She is staying active.  No chest pain.  No sob.  No acid reflux.  No abdominal pain.  Bowels moving.  Has seen ENT for neck nodule.  Had recent ultrasound.  Stable.  Taking crestor.  Has noticed fullness in right axilla.  Discussed further imaging.  Also reports persistent left hip pain.  No pain radiating down leg.  No numbness and tingling.     Past Medical History:  Diagnosis Date  . Arthritis   . Environmental allergies   . Esophagitis    Barrets, followed by Dr Vennie Homans  . GERD (gastroesophageal reflux disease)   . Hypercholesterolemia   . Hypothyroidism    Past Surgical History:  Procedure Laterality Date  . [place]  . KNEE ARTHROSCOPY  12/03/07   left  . KNEE ARTHROSCOPY  06/25/08   left  . NASAL SINUS SURGERY  12/06  . REPLACEMENT TOTAL KNEE  01/04/09   left  . RHINOPLASTY  1986  . VAGINAL HYSTERECTOMY  1980   ovaries left in place  . WRIST SURGERY  1970   cyst removal   Family History  Problem Relation Age of Onset  . Heart disease Father        heart attack  . Diabetes Father   . Hyperlipidemia Paternal Grandmother   . Diabetes Paternal Grandmother   . Stroke Paternal Grandmother   . Hyperlipidemia Paternal Grandfather   . Diabetes Paternal Grandfather   . Prostate cancer Maternal Uncle   . Pancreatic cancer Cousin   . Multiple myeloma Brother   . Breast cancer Maternal Aunt   . Breast cancer Maternal Aunt    Social History   Socioeconomic History  . Marital status: Married    Spouse name: Not on file  . Number of children: Not on file  . Years of education: Not on file  . Highest education level: Not on file  Occupational History  . Not on file  Social Needs  . Financial  resource strain: Not hard at all  . Food insecurity    Worry: Never true    Inability: Never true  . Transportation needs    Medical: No    Non-medical: No  Tobacco Use  . Smoking status: Former Smoker    Years: 5.00    Types: Cigarettes  . Smokeless tobacco: Never Used  Substance and Sexual Activity  . Alcohol use: Yes    Alcohol/week: 0.0 standard drinks    Comment: rare  . Drug use: No  . Sexual activity: Yes  Lifestyle  . Physical activity    Days per week: 3 days    Minutes per session: 30 min  . Stress: Not at all  Relationships  . Social Herbalist on phone: Not on file    Gets together: Not on file    Attends religious service: Not on file    Active member of club or organization: Not on file    Attends meetings of clubs or organizations: Not on file    Relationship status: Not on file  Other Topics Concern  . Not on file  Social History Narrative  . Not  on file    Outpatient Encounter Medications as of 02/19/2019  Medication Sig  . acetaminophen (TYLENOL) 500 MG tablet Take 500 mg by mouth every 6 (six) hours as needed.  . Ascorbic Acid (VITAMIN C) 1000 MG tablet Take 1,000 mg by mouth daily.  Marland Kitchen BIOTIN 5000 PO Take 1 tablet by mouth daily.  . budesonide (PULMICORT) 1 MG/2ML nebulizer solution ADD 1ML OF MEDICATION TO 240ML OF SALINE IN SALINE IRRIGATION BOTTLE; IRRIGATE SINUSES WITH 120ML THROUGH EACH NOSTRIL TWICE DAILY  . Cholecalciferol (VITAMIN D PO) Take 5,000 Units by mouth daily.  . Coenzyme Q10 60 MG TABS Take 120 mg by mouth daily.  . diclofenac sodium (VOLTAREN) 1 % GEL Apply 1 application topically as needed.  . fluticasone (FLONASE) 50 MCG/ACT nasal spray Place 2 sprays into both nostrils daily.  . Hypertonic Nasal Wash (SINUS RINSE NA) Place into the nose as needed.  Marland Kitchen levothyroxine (LEVOTHROID) 75 MCG tablet Take 1 tablet (75 mcg total) by mouth every other day.  . levothyroxine (SYNTHROID, LEVOTHROID) 50 MCG tablet TAKE 1 TABLET EVERY  OTHER  DAY  . MAGNESIUM PO Take 1 tablet by mouth.  . Multiple Vitamin (MULTIVITAMIN) LIQD Take 5 mLs by mouth daily.  . Omega-3 Fatty Acids (FISH OIL) 1360 MG CAPS Take by mouth daily.  Marland Kitchen omeprazole (PRILOSEC) 40 MG capsule Take 40 mg by mouth daily.  . Probiotic Product (PROBIOTIC DAILY PO) Take 1 capsule by mouth.  . rosuvastatin (CRESTOR) 5 MG tablet TAKE 1 TABLET DAILY  . TOLAK 4 % CREA Apply daily from knees down  . triamcinolone cream (KENALOG) 0.1 % PLEASE SEE ATTACHED FOR DETAILED DIRECTIONS  . vitamin E 400 UNIT capsule Take 400 Units by mouth daily.  . [DISCONTINUED] acyclovir (ZOVIRAX) 800 MG tablet Take 1 tablet (800 mg total) by mouth 2 (two) times daily. As directed. (Patient not taking: Reported on 10/15/2018)  . [DISCONTINUED] amoxicillin-clavulanate (AUGMENTIN) 875-125 MG tablet Take 1 tablet by mouth 2 (two) times daily. (Patient not taking: Reported on 12/21/2017)  . [DISCONTINUED] busPIRone (BUSPAR) 5 MG tablet Take 1 tablet (5 mg total) by mouth 2 (two) times daily. (Patient not taking: Reported on 01/30/2019)  . [DISCONTINUED] carbamide peroxide (DEBROX) 6.5 % OTIC solution 3-4 drops in right ear daily.  Massage for approximately 5 minutes (Patient not taking: Reported on 10/15/2018)   No facility-administered encounter medications on file as of 02/19/2019.     Review of Systems  Constitutional: Negative for appetite change and unexpected weight change.  HENT: Negative for congestion and sinus pressure.   Eyes: Negative for pain and visual disturbance.  Respiratory: Negative for cough, chest tightness and shortness of breath.   Cardiovascular: Negative for chest pain, palpitations and leg swelling.  Gastrointestinal: Negative for abdominal pain, diarrhea, nausea and vomiting.  Genitourinary: Negative for difficulty urinating and dysuria.  Musculoskeletal: Negative for joint swelling and myalgias.  Skin: Negative for color change and rash.  Neurological: Negative for  dizziness, light-headedness and headaches.  Hematological: Negative for adenopathy. Does not bruise/bleed easily.  Psychiatric/Behavioral: Negative for agitation and dysphoric mood.       Objective:    Physical Exam Constitutional:      General: She is not in acute distress.    Appearance: Normal appearance.  HENT:     Right Ear: External ear normal.     Left Ear: External ear normal.  Eyes:     General: No scleral icterus.       Right eye: No discharge.  Left eye: No discharge.     Conjunctiva/sclera: Conjunctivae normal.  Neck:     Musculoskeletal: Neck supple. No muscular tenderness.     Thyroid: No thyromegaly.  Cardiovascular:     Rate and Rhythm: Normal rate and regular rhythm.  Pulmonary:     Effort: No respiratory distress.     Breath sounds: Normal breath sounds. No wheezing.     Comments: Breasts:  No nipple discharge or nipple retraction present.  Could not appreciate any distinct nodules.  Right axillary fullness.  No left axillary fullness.   Abdominal:     General: Bowel sounds are normal.     Palpations: Abdomen is soft.     Tenderness: There is no abdominal tenderness.  Musculoskeletal:        General: No swelling or tenderness.  Lymphadenopathy:     Cervical: No cervical adenopathy.  Skin:    Findings: No erythema or rash.  Neurological:     Mental Status: She is alert.  Psychiatric:        Mood and Affect: Mood normal.        Behavior: Behavior normal.     BP 116/70   Pulse 66   Temp 97.7 F (36.5 C)   Resp 16   Ht 5' 4"  (1.626 m)   Wt 144 lb 3.2 oz (65.4 kg)   SpO2 97%   BMI 24.75 kg/m  Wt Readings from Last 3 Encounters:  02/19/19 144 lb 3.2 oz (65.4 kg)  01/30/19 141 lb (64 kg)  04/24/18 151 lb 6.4 oz (68.7 kg)     Lab Results  Component Value Date   WBC 7.2 03/27/2018   HGB 13.0 03/27/2018   HCT 38.5 03/27/2018   PLT 276.0 03/27/2018   GLUCOSE 92 09/19/2018   CHOL 212 (H) 09/19/2018   TRIG 152.0 (H) 09/19/2018   HDL  53.90 09/19/2018   LDLDIRECT 123.4 02/27/2013   LDLCALC 127 (H) 09/19/2018   ALT 14 09/19/2018   AST 17 09/19/2018   NA 140 09/19/2018   K 4.4 09/19/2018   CL 104 09/19/2018   CREATININE 0.64 09/19/2018   BUN 16 09/19/2018   CO2 28 09/19/2018   TSH 0.66 09/19/2018   HGBA1C 5.7 09/19/2018    US Soft Tissue Head & Neck (non-thyroid)  Result Date: 01/30/2019 CLINICAL DATA:  73 year old with prominent cervical lymph nodes. Patient was scheduled for ultrasound-guided core biopsy of a right jugulodigastric lymph node. EXAM: ULTRASOUND OF HEAD/NECK SOFT TISSUES TECHNIQUE: Ultrasound examination of the head and neck soft tissues was performed in the area of clinical concern. COMPARISON:  03/17/2019 FINDINGS: Again noted are small lymph nodes scattered throughout the neck. Dominant lymph node is in the right submandibular region and measures 1.5 x 0.7 x 1.1 cm and previously measured 1.4 x 0.7 x 1.1 cm. There is a normal fatty hilum and the morphology is stable from the prior examination. This lymph node has multiple surrounding vascular structures including an adjacent right internal jugular vein and carotid artery. There is not a safe percutaneous window for a core biopsy. Discussed with Dr. Pryor Ochoa and we decided not to perform a fine-needle aspiration due to the low yield. IMPRESSION: Stable lymph node in the right upper neck. Ultrasound-guided biopsy was not performed. Electronically Signed   By: Markus Daft M.D.   On: 01/30/2019 11:47       Assessment & Plan:   Problem List Items Addressed This Visit    Axillary fullness    Right axillary  fullness.  Schedule diagnostic mammogram with ultrasound.  Due screening.        Relevant Orders   MM DIAG BREAST TOMO BILATERAL   US AXILLA RIGHT   US BREAST LTD UNI RIGHT INC AXILLA   GERD (gastroesophageal reflux disease)    Had EGD 01/2018.  Reflux controlled.  Continue current medication regimen.        Health care maintenance    Physical today  02/19/19.  Mammogram 03/14/18 - [facility].  Scheduled f/u mammogram (diagnostic) as outlined.  Colonoscopy 04/2013. Recommended f/u in 5 years.  Had f/u colonoscopy 01/15/18.  Recommended f/u in 5 years.        Hypercholesteremia    On crestor.  Low cholesterol diet and exercise.  Follow lipid panel and liver function tests.        Relevant Orders   CBC with Differential/Platelet   Hepatic function panel   Lipid panel   Basic metabolic panel   Hypothyroidism    On thyroid replacement.  Follow tsh.        Left hip pain    Persistent pain.  Check L-S spine xray and left hip xray.        Relevant Orders   DG Lumbar Spine 2-3 Views (Completed)   DG Hip Unilat W OR W/O Pelvis 2-3 Views Left (Completed)   Lymphadenopathy    Saw ENT.  Ultrasound stable.  Followed by ENT.        Other Visit Diagnoses    Routine general medical examination at a health care facility    -  Primary   Need for immunization against influenza       Relevant Orders   Flu Vaccine QUAD High Dose(Fluad) (Completed)   Hyperglycemia       Relevant Orders   Hemoglobin A1c       Einar Pheasant, MD

## 2019-02-19 NOTE — Patient Instructions (Addendum)
  [name] , Thank you for taking time to come for your Medicare Wellness Visit. I appreciate your ongoing commitment to your health goals. Please review the following plan we discussed and let me know if I can assist you in the future.   These are the goals we discussed: Goals    . Follow up with Provider as scheduled       This is a list of the screening recommended for you and due dates:  Health Maintenance  Topic Date Due  . Flu Shot  01/12/2019  . Mammogram  03/15/2019  . Colon Cancer Screening  01/16/2023  . Tetanus Vaccine  11/16/2025  . DEXA scan (bone density measurement)  Completed  .  Hepatitis C: One time screening is recommended by Center for Disease Control  (CDC) for  adults born from [birth year] through 1966.   Completed  . Pneumonia vaccines  Completed

## 2019-02-22 ENCOUNTER — Telehealth: Payer: Self-pay | Admitting: *Deleted

## 2019-02-22 NOTE — Telephone Encounter (Signed)
Please place future orders for lab appt.  

## 2019-02-23 ENCOUNTER — Encounter: Payer: Self-pay | Admitting: Internal Medicine

## 2019-02-23 NOTE — Assessment & Plan Note (Signed)
Persistent pain.  Check L-S spine xray and left hip xray.

## 2019-02-23 NOTE — Assessment & Plan Note (Signed)
On crestor.  Low cholesterol diet and exercise.  Follow lipid panel and liver function tests.   

## 2019-02-23 NOTE — Assessment & Plan Note (Signed)
On thyroid replacement.  Follow tsh.  

## 2019-02-23 NOTE — Assessment & Plan Note (Signed)
Had EGD 01/2018.  Reflux controlled.  Continue current medication regimen.   

## 2019-02-23 NOTE — Assessment & Plan Note (Signed)
Right axillary fullness.  Schedule diagnostic mammogram with ultrasound.  Due screening.

## 2019-02-23 NOTE — Assessment & Plan Note (Signed)
Saw ENT.  Ultrasound stable.  Followed by ENT.

## 2019-02-24 NOTE — Telephone Encounter (Signed)
Orders in for f/u fasting labs.

## 2019-02-26 ENCOUNTER — Other Ambulatory Visit (INDEPENDENT_AMBULATORY_CARE_PROVIDER_SITE_OTHER): Payer: Medicare HMO

## 2019-02-26 ENCOUNTER — Telehealth: Payer: Self-pay | Admitting: *Deleted

## 2019-02-26 ENCOUNTER — Other Ambulatory Visit: Payer: Self-pay

## 2019-02-26 ENCOUNTER — Encounter: Payer: Self-pay | Admitting: Internal Medicine

## 2019-02-26 DIAGNOSIS — E7800 Pure hypercholesterolemia, unspecified: Secondary | ICD-10-CM | POA: Diagnosis not present

## 2019-02-26 DIAGNOSIS — R739 Hyperglycemia, unspecified: Secondary | ICD-10-CM

## 2019-02-26 LAB — BASIC METABOLIC PANEL WITH GFR
BUN: 14 mg/dL (ref 6–23)
CO2: 31 meq/L (ref 19–32)
Calcium: 10 mg/dL (ref 8.4–10.5)
Chloride: 105 meq/L (ref 96–112)
Creatinine, Ser: 0.66 mg/dL (ref 0.40–1.20)
GFR: 87.55 mL/min
Glucose, Bld: 85 mg/dL (ref 70–99)
Potassium: 4.5 meq/L (ref 3.5–5.1)
Sodium: 142 meq/L (ref 135–145)

## 2019-02-26 LAB — CBC WITH DIFFERENTIAL/PLATELET
Basophils Absolute: 0 10*3/uL (ref 0.0–0.1)
Basophils Relative: 0.6 % (ref 0.0–3.0)
Eosinophils Absolute: 0.1 10*3/uL (ref 0.0–0.7)
Eosinophils Relative: 1.4 % (ref 0.0–5.0)
HCT: 39.4 % (ref 36.0–46.0)
Hemoglobin: 13.1 g/dL (ref 12.0–15.0)
Lymphocytes Relative: 35.9 % (ref 12.0–46.0)
Lymphs Abs: 1.5 10*3/uL (ref 0.7–4.0)
MCHC: 33.3 g/dL (ref 30.0–36.0)
MCV: 96.7 fl (ref 78.0–100.0)
Monocytes Absolute: 0.4 10*3/uL (ref 0.1–1.0)
Monocytes Relative: 10.3 % (ref 3.0–12.0)
Neutro Abs: 2.2 10*3/uL (ref 1.4–7.7)
Neutrophils Relative %: 51.8 % (ref 43.0–77.0)
Platelets: 248 10*3/uL (ref 150.0–400.0)
RBC: 4.07 Mil/uL (ref 3.87–5.11)
RDW: 13 % (ref 11.5–15.5)
WBC: 4.2 10*3/uL (ref 4.0–10.5)

## 2019-02-26 LAB — HEMOGLOBIN A1C: Hgb A1c MFr Bld: 5.7 % (ref 4.6–6.5)

## 2019-02-26 LAB — HEPATIC FUNCTION PANEL
ALT: 14 U/L (ref 0–35)
AST: 17 U/L (ref 0–37)
Albumin: 4.4 g/dL (ref 3.5–5.2)
Alkaline Phosphatase: 66 U/L (ref 39–117)
Bilirubin, Direct: 0.1 mg/dL (ref 0.0–0.3)
Total Bilirubin: 0.5 mg/dL (ref 0.2–1.2)
Total Protein: 6.7 g/dL (ref 6.0–8.3)

## 2019-02-26 LAB — LIPID PANEL
Cholesterol: 183 mg/dL (ref 0–200)
HDL: 57.2 mg/dL
LDL Cholesterol: 95 mg/dL (ref 0–99)
NonHDL: 125.97
Total CHOL/HDL Ratio: 3
Triglycerides: 154 mg/dL — ABNORMAL HIGH (ref 0.0–149.0)
VLDL: 30.8 mg/dL (ref 0.0–40.0)

## 2019-02-26 NOTE — Addendum Note (Signed)
Addended by: Leeanne Rio on: 02/26/2019 09:13 AM   Modules accepted: Orders

## 2019-02-26 NOTE — Telephone Encounter (Signed)
Pt requested a copy of x-rays from 02/19/19 on CD while she was here today for labs. CD was made & given to patient.

## 2019-03-06 DIAGNOSIS — M5412 Radiculopathy, cervical region: Secondary | ICD-10-CM | POA: Diagnosis not present

## 2019-03-06 DIAGNOSIS — M9903 Segmental and somatic dysfunction of lumbar region: Secondary | ICD-10-CM | POA: Diagnosis not present

## 2019-03-06 DIAGNOSIS — M9901 Segmental and somatic dysfunction of cervical region: Secondary | ICD-10-CM | POA: Diagnosis not present

## 2019-03-06 DIAGNOSIS — M5033 Other cervical disc degeneration, cervicothoracic region: Secondary | ICD-10-CM | POA: Diagnosis not present

## 2019-03-18 ENCOUNTER — Ambulatory Visit
Admission: RE | Admit: 2019-03-18 | Discharge: 2019-03-18 | Disposition: A | Discharge status: Home / Self Care | Payer: Medicare HMO | Source: Ambulatory Visit | Attending: Internal Medicine | Admitting: Internal Medicine

## 2019-03-18 DIAGNOSIS — R222 Localized swelling, mass and lump, trunk: Secondary | ICD-10-CM

## 2019-03-18 DIAGNOSIS — R2230 Localized swelling, mass and lump, unspecified upper limb: Secondary | ICD-10-CM

## 2019-03-18 DIAGNOSIS — R928 Other abnormal and inconclusive findings on diagnostic imaging of breast: Secondary | ICD-10-CM | POA: Diagnosis not present

## 2019-03-18 DIAGNOSIS — N6331 Unspecified lump in axillary tail of the right breast: Secondary | ICD-10-CM | POA: Diagnosis not present

## 2019-03-18 DIAGNOSIS — N6489 Other specified disorders of breast: Secondary | ICD-10-CM | POA: Diagnosis not present

## 2019-03-19 DIAGNOSIS — K219 Gastro-esophageal reflux disease without esophagitis: Secondary | ICD-10-CM | POA: Diagnosis not present

## 2019-03-19 DIAGNOSIS — K449 Diaphragmatic hernia without obstruction or gangrene: Secondary | ICD-10-CM | POA: Diagnosis not present

## 2019-03-19 DIAGNOSIS — Z86010 Personal history of colonic polyps: Secondary | ICD-10-CM | POA: Diagnosis not present

## 2019-03-29 ENCOUNTER — Other Ambulatory Visit: Payer: Self-pay | Admitting: Internal Medicine

## 2019-03-29 MED ORDER — ROSUVASTATIN CALCIUM 5 MG PO TABS: 5.0000 mg | ORAL_TABLET | Freq: Every day | ORAL | 1 refills | Status: DC

## 2019-03-29 NOTE — Telephone Encounter (Signed)
Pt said [facility] will reach out to her doctor. Pt needs refill on rosuvastatin 5 mg. #90 w/refills

## 2019-04-05 DIAGNOSIS — M25561 Pain in right knee: Secondary | ICD-10-CM | POA: Diagnosis not present

## 2019-04-08 DIAGNOSIS — M7661 Achilles tendinitis, right leg: Secondary | ICD-10-CM | POA: Diagnosis not present

## 2019-04-08 DIAGNOSIS — M19072 Primary osteoarthritis, left ankle and foot: Secondary | ICD-10-CM | POA: Diagnosis not present

## 2019-04-08 DIAGNOSIS — M9901 Segmental and somatic dysfunction of cervical region: Secondary | ICD-10-CM | POA: Diagnosis not present

## 2019-04-08 DIAGNOSIS — M19071 Primary osteoarthritis, right ankle and foot: Secondary | ICD-10-CM | POA: Diagnosis not present

## 2019-04-08 DIAGNOSIS — M5033 Other cervical disc degeneration, cervicothoracic region: Secondary | ICD-10-CM | POA: Diagnosis not present

## 2019-04-08 DIAGNOSIS — M5412 Radiculopathy, cervical region: Secondary | ICD-10-CM | POA: Diagnosis not present

## 2019-04-08 DIAGNOSIS — M9903 Segmental and somatic dysfunction of lumbar region: Secondary | ICD-10-CM | POA: Diagnosis not present

## 2019-04-23 DIAGNOSIS — M7661 Achilles tendinitis, right leg: Secondary | ICD-10-CM | POA: Diagnosis not present

## 2019-05-02 DIAGNOSIS — H6062 Unspecified chronic otitis externa, left ear: Secondary | ICD-10-CM | POA: Diagnosis not present

## 2019-05-02 DIAGNOSIS — R591 Generalized enlarged lymph nodes: Secondary | ICD-10-CM | POA: Diagnosis not present

## 2019-05-08 DIAGNOSIS — Z20828 Contact with and (suspected) exposure to other viral communicable diseases: Secondary | ICD-10-CM | POA: Diagnosis not present

## 2019-05-13 ENCOUNTER — Other Ambulatory Visit: Payer: Self-pay

## 2019-05-13 ENCOUNTER — Ambulatory Visit (INDEPENDENT_AMBULATORY_CARE_PROVIDER_SITE_OTHER): Payer: Medicare HMO | Admitting: Internal Medicine

## 2019-05-13 DIAGNOSIS — U071 COVID-19: Secondary | ICD-10-CM

## 2019-05-13 DIAGNOSIS — K2100 Gastro-esophageal reflux disease with esophagitis, without bleeding: Secondary | ICD-10-CM

## 2019-05-13 DIAGNOSIS — R197 Diarrhea, unspecified: Secondary | ICD-10-CM | POA: Diagnosis not present

## 2019-05-13 NOTE — Progress Notes (Signed)
Patient ID: [name], female   DOB: March 17, 1946, 73 y.o.   MRN: [id number]   Virtual Visit via telephone Note  This visit type was conducted due to national recommendations for restrictions regarding the COVID-19 pandemic (e.g. social distancing).  This format is felt to be most appropriate for this patient at this time.  All issues noted in this document were discussed and addressed.  No physical exam was performed (except for noted visual exam findings with Video Visits).   I connected with Zaineb Evertt by telephone and verified that I am speaking with the correct person using two identifiers. Location patient: home Location provider: work or home office Persons participating in the virtual visit: patient, provider  I discussed the limitations, risks, security and privacy concerns of performing an evaluation and management service by telephone and the availability of in person appointments.  The patient expressed understanding and agreed to proceed.   Reason for visit: acute visit  HPI: States she went to [facility] 04/26/19.  Out with friends.  11/22 - started feeling bad.  11/24 - tested for covid.  Positive.  Having some cough and congestion.  Cough occurs when takes deep breath.  No chest pain.  No sob.  Fever.  Today 98.5.  Tmax 101.5.  No vomiting.  Trying to stay hydrated.  Drinking pedialyte.  Has lost her taste.  Had a dull headache the last few days.  No severe headache.  Some diarrhea.  Some better today.  Only two episodes of diarrhea today.  Does feel some better.     ROS: See pertinent positives and negatives per HPI.  Past Medical History:  Diagnosis Date  . Arthritis   . Environmental allergies   . Esophagitis    [place], followed by Dr Vennie Homans  . GERD (gastroesophageal reflux disease)   . Hypercholesterolemia   . Hypothyroidism     Past Surgical History:  Procedure Laterality Date  . [place]  . KNEE ARTHROSCOPY  12/03/07   left  . KNEE  ARTHROSCOPY  06/25/08   left  . NASAL SINUS SURGERY  12/06  . REPLACEMENT TOTAL KNEE  01/04/09   left  . RHINOPLASTY  1986  . VAGINAL HYSTERECTOMY  1980   ovaries left in place  . WRIST SURGERY  1970   cyst removal    Family History  Problem Relation Age of Onset  . Heart disease Father        heart attack  . Diabetes Father   . Hyperlipidemia Paternal Grandmother   . Diabetes Paternal Grandmother   . Stroke Paternal Grandmother   . Hyperlipidemia Paternal Grandfather   . Diabetes Paternal Grandfather   . Prostate cancer Maternal Uncle   . Pancreatic cancer Cousin   . Multiple myeloma Brother   . Breast cancer Maternal Aunt   . Breast cancer Maternal Aunt     SOCIAL HX: reviewed.    Current Outpatient Medications:  .  acetaminophen (TYLENOL) 500 MG tablet, Take 500 mg by mouth every 6 (six) hours as needed., Disp: , Rfl:  .  acyclovir (ZOVIRAX) 800 MG tablet, Take by mouth 2 (two) times daily., Disp: , Rfl:  .  Ascorbic Acid (VITAMIN C) 1000 MG tablet, Take 1,000 mg by mouth daily., Disp: , Rfl:  .  BIOTIN 5000 PO, Take 1 tablet by mouth daily., Disp: , Rfl:  .  budesonide (PULMICORT) 1 MG/2ML nebulizer solution, ADD 1ML OF MEDICATION TO 240ML OF SALINE IN SALINE IRRIGATION BOTTLE;  IRRIGATE SINUSES WITH 120ML THROUGH EACH NOSTRIL TWICE DAILY, Disp: , Rfl:  .  BUDESONIDE PO, 2 (two) times daily., Disp: , Rfl:  .  Cholecalciferol (VITAMIN D PO), Take 5,000 Units by mouth daily., Disp: , Rfl:  .  Coenzyme Q10 60 MG TABS, Take 120 mg by mouth daily., Disp: , Rfl:  .  diclofenac sodium (VOLTAREN) 1 % GEL, Apply 1 application topically as needed., Disp: , Rfl:  .  ELDERBERRY PO, Take by mouth daily., Disp: , Rfl:  .  fluticasone (FLONASE) 50 MCG/ACT nasal spray, Place 2 sprays into both nostrils daily., Disp: 16 g, Rfl: 6 .  Hypertonic Nasal Wash (SINUS RINSE NA), Place into the nose as needed., Disp: , Rfl:  .  LEVOFLOXACIN PO, 2 (two) times daily. levofloxacin 169m/ml cash   ADD 1ML OF MEDICATION TO 240ML OF SALINE IN SALINE IRRIGATION BOTTLE; IRRIGATE, Disp: , Rfl:  .  levothyroxine (LEVOTHROID) 75 MCG tablet, Take 1 tablet (75 mcg total) by mouth every other day., Disp: 90 tablet, Rfl: 1 .  levothyroxine (SYNTHROID, LEVOTHROID) 50 MCG tablet, TAKE 1 TABLET EVERY OTHER  DAY, Disp: 45 tablet, Rfl: 3 .  MAGNESIUM PO, Take 1 tablet by mouth., Disp: , Rfl:  .  Multiple Vitamin (MULTIVITAMIN) LIQD, Take 5 mLs by mouth daily., Disp: , Rfl:  .  Omega-3 Fatty Acids (FISH OIL) 1360 MG CAPS, Take by mouth daily., Disp: , Rfl:  .  omeprazole (PRILOSEC) 40 MG capsule, Take 40 mg by mouth daily., Disp: , Rfl:  .  Probiotic Product (PROBIOTIC DAILY PO), Take 1 capsule by mouth., Disp: , Rfl:  .  rosuvastatin (CRESTOR) 5 MG tablet, Take 1 tablet (5 mg total) by mouth daily., Disp: 90 tablet, Rfl: 1 .  rosuvastatin (CRESTOR) 5 MG tablet, TAKE 1 TABLET DAILY, Disp: 90 tablet, Rfl: 1 .  TOLAK 4 % CREA, Apply daily from knees down, Disp: , Rfl: 0 .  triamcinolone cream (KENALOG) 0.1 %, PLEASE SEE ATTACHED FOR DETAILED DIRECTIONS, Disp: , Rfl:  .  vitamin E 400 UNIT capsule, Take 400 Units by mouth daily., Disp: , Rfl:   EXAM:  GENERAL: alert.  Sounds to be in no acute distress.  Answering questions appropriately.    PSYCH/NEURO: pleasant and cooperative, no obvious depression or anxiety, speech and thought processing grossly intact  ASSESSMENT AND PLAN:  Discussed the following assessment and plan:  COVID-19 virus infection Positive for covid.  Symptoms as outlined.  Treating symptomatically.  Diarrhea improved.  She is trying to stay hydrated.  Discussed self quarantine.    Diarrhea She is trying to stay hydrated.  Diarrhea improved.  Follow.    GERD (gastroesophageal reflux disease) Controlled.      I discussed the assessment and treatment plan with the patient. The patient was provided an opportunity to ask questions and all were answered. The patient agreed with the  plan and demonstrated an understanding of the instructions.   The patient was advised to call back or seek an in-person evaluation if the symptoms worsen or if the condition fails to improve as anticipated.  I provided 24 minutes of non-face-to-face time during this encounter.   CEinar Pheasant MD

## 2019-05-18 ENCOUNTER — Encounter: Payer: Self-pay | Admitting: Internal Medicine

## 2019-05-18 DIAGNOSIS — U071 COVID-19: Secondary | ICD-10-CM | POA: Insufficient documentation

## 2019-05-18 NOTE — Assessment & Plan Note (Signed)
Controlled.  

## 2019-05-18 NOTE — Assessment & Plan Note (Signed)
Positive for covid.  Symptoms as outlined.  Treating symptomatically.  Diarrhea improved.  She is trying to stay hydrated.  Discussed self quarantine.

## 2019-05-18 NOTE — Assessment & Plan Note (Signed)
She is trying to stay hydrated.  Diarrhea improved.  Follow.

## 2019-05-21 ENCOUNTER — Ambulatory Visit (INDEPENDENT_AMBULATORY_CARE_PROVIDER_SITE_OTHER): Payer: Medicare HMO | Admitting: Internal Medicine

## 2019-05-21 ENCOUNTER — Other Ambulatory Visit: Payer: Self-pay

## 2019-05-21 ENCOUNTER — Encounter: Payer: Self-pay | Admitting: Internal Medicine

## 2019-05-21 VITALS — BP 124/69 | HR 51 | Temp 97.3°F | Ht 64.0 in | Wt 144.0 lb

## 2019-05-21 DIAGNOSIS — U071 COVID-19: Secondary | ICD-10-CM | POA: Diagnosis not present

## 2019-05-21 DIAGNOSIS — R05 Cough: Secondary | ICD-10-CM

## 2019-05-21 DIAGNOSIS — R059 Cough, unspecified: Secondary | ICD-10-CM

## 2019-05-21 DIAGNOSIS — J329 Chronic sinusitis, unspecified: Secondary | ICD-10-CM | POA: Insufficient documentation

## 2019-05-21 DIAGNOSIS — R739 Hyperglycemia, unspecified: Secondary | ICD-10-CM

## 2019-05-21 DIAGNOSIS — E7800 Pure hypercholesterolemia, unspecified: Secondary | ICD-10-CM

## 2019-05-21 NOTE — Progress Notes (Signed)
Patient ID: [name], female   DOB: 01/07/1946, 73 y.o.   MRN: [id number]   Virtual Visit via video Note  This visit type was conducted due to national recommendations for restrictions regarding the COVID-19 pandemic (e.g. social distancing).  This format is felt to be most appropriate for this patient at this time.  All issues noted in this document were discussed and addressed.  No physical exam was performed (except for noted visual exam findings with Video Visits).   I connected with Brynda Greathouse by a video enabled telemedicine application and verified that I am speaking with the correct person using two identifiers. Location patient: home Location provider: work Persons participating in the virtual visit: patient, provider  I discussed the limitations, risks, security and privacy concerns of performing an evaluation and management service by video and the availability of in person appointments.  The patient expressed understanding and agreed to proceed.   Reason for visit:  Work in appt  HPI: Tested positive for covid 05/07/19.  Was having cough and congestion and fever.  Reports feeling some better.  No fever for 5 days.  Smell and taste - back.  Appetite better.  No vomiting.  Nausea better.  Chest tightness still.  Discussed cxr.     ROS: See pertinent positives and negatives per HPI.  Past Medical History:  Diagnosis Date  . Arthritis   . Environmental allergies   . Esophagitis    [place], followed by Dr Vennie Homans  . GERD (gastroesophageal reflux disease)   . Hypercholesterolemia   . Hypothyroidism     Past Surgical History:  Procedure Laterality Date  . [place]  . KNEE ARTHROSCOPY  12/03/07   left  . KNEE ARTHROSCOPY  06/25/08   left  . NASAL SINUS SURGERY  12/06  . REPLACEMENT TOTAL KNEE  01/04/09   left  . RHINOPLASTY  1986  . VAGINAL HYSTERECTOMY  1980   ovaries left in place  . WRIST SURGERY  1970   cyst removal    Family History   Problem Relation Age of Onset  . Heart disease Father        heart attack  . Diabetes Father   . Hyperlipidemia Paternal Grandmother   . Diabetes Paternal Grandmother   . Stroke Paternal Grandmother   . Hyperlipidemia Paternal Grandfather   . Diabetes Paternal Grandfather   . Prostate cancer Maternal Uncle   . Pancreatic cancer Cousin   . Multiple myeloma Brother   . Breast cancer Maternal Aunt   . Breast cancer Maternal Aunt     SOCIAL HX: reviewed.    Current Outpatient Medications:  .  acetaminophen (TYLENOL) 500 MG tablet, Take 500 mg by mouth every 6 (six) hours as needed., Disp: , Rfl:  .  acyclovir (ZOVIRAX) 800 MG tablet, Take by mouth 2 (two) times daily., Disp: , Rfl:  .  Ascorbic Acid (VITAMIN C) 1000 MG tablet, Take 1,000 mg by mouth daily., Disp: , Rfl:  .  BIOTIN 5000 PO, Take 1 tablet by mouth daily., Disp: , Rfl:  .  budesonide (PULMICORT) 1 MG/2ML nebulizer solution, ADD 1ML OF MEDICATION TO 240ML OF SALINE IN SALINE IRRIGATION BOTTLE; IRRIGATE SINUSES WITH 120ML THROUGH EACH NOSTRIL TWICE DAILY, Disp: , Rfl:  .  BUDESONIDE PO, 2 (two) times daily., Disp: , Rfl:  .  Cholecalciferol (VITAMIN D PO), Take 5,000 Units by mouth daily., Disp: , Rfl:  .  Coenzyme Q10 60 MG TABS, Take 120 mg  by mouth daily., Disp: , Rfl:  .  diclofenac sodium (VOLTAREN) 1 % GEL, Apply 1 application topically as needed., Disp: , Rfl:  .  ELDERBERRY PO, Take by mouth daily., Disp: , Rfl:  .  fluticasone (FLONASE) 50 MCG/ACT nasal spray, Place 2 sprays into both nostrils daily., Disp: 16 g, Rfl: 6 .  Hypertonic Nasal Wash (SINUS RINSE NA), Place into the nose as needed., Disp: , Rfl:  .  LEVOFLOXACIN PO, 2 (two) times daily. levofloxacin 120m/ml cash  ADD 1ML OF MEDICATION TO 240ML OF SALINE IN SALINE IRRIGATION BOTTLE; IRRIGATE, Disp: , Rfl:  .  levothyroxine (LEVOTHROID) 75 MCG tablet, Take 1 tablet (75 mcg total) by mouth every other day., Disp: 90 tablet, Rfl: 1 .  levothyroxine  (SYNTHROID, LEVOTHROID) 50 MCG tablet, TAKE 1 TABLET EVERY OTHER  DAY, Disp: 45 tablet, Rfl: 3 .  MAGNESIUM PO, Take 1 tablet by mouth., Disp: , Rfl:  .  Multiple Vitamin (MULTIVITAMIN) LIQD, Take 5 mLs by mouth daily., Disp: , Rfl:  .  Omega-3 Fatty Acids (FISH OIL) 1360 MG CAPS, Take by mouth daily., Disp: , Rfl:  .  omeprazole (PRILOSEC) 40 MG capsule, Take 40 mg by mouth daily., Disp: , Rfl:  .  Probiotic Product (PROBIOTIC DAILY PO), Take 1 capsule by mouth., Disp: , Rfl:  .  rosuvastatin (CRESTOR) 5 MG tablet, Take 1 tablet (5 mg total) by mouth daily., Disp: 90 tablet, Rfl: 1 .  TOLAK 4 % CREA, Apply daily from knees down, Disp: , Rfl: 0 .  triamcinolone cream (KENALOG) 0.1 %, PLEASE SEE ATTACHED FOR DETAILED DIRECTIONS, Disp: , Rfl:  .  vitamin E 400 UNIT capsule, Take 400 Units by mouth daily., Disp: , Rfl:   EXAM:  GENERAL: alert, oriented, appears well and in no acute distress  HEENT: atraumatic, conjunttiva clear, no obvious abnormalities on inspection of external nose and ears  NECK: normal movements of the head and neck  LUNGS: on inspection no signs of respiratory distress, breathing rate appears normal, no obvious gross SOB, gasping or wheezing  CV: no obvious cyanosis  PSYCH/NEURO: pleasant and cooperative, no obvious depression or anxiety, speech and thought processing grossly intact  ASSESSMENT AND PLAN:  Discussed the following assessment and plan:  Cough Recently diagnosed with covid.  Is feeling some better.  Persistent cough and some chest tightness. Will check cxr.  Continue symptomatic treatment.  Continue self quarantine.  Follow closely.  Call with update.   COVID-19 virus infection Improved, but still with chest tightness and cough.  Check cxr.  Treat symptoms.  Self quarantine.  Follow closely.    Hypercholesteremia Check lipid profile with next labs.      I discussed the assessment and treatment plan with the patient. The patient was provided an  opportunity to ask questions and all were answered. The patient agreed with the plan and demonstrated an understanding of the instructions.   The patient was advised to call back or seek an in-person evaluation if the symptoms worsen or if the condition fails to improve as anticipated.   CEinar Pheasant MD

## 2019-05-22 ENCOUNTER — Other Ambulatory Visit: Payer: Self-pay

## 2019-05-22 ENCOUNTER — Ambulatory Visit
Admission: RE | Admit: 2019-05-22 | Discharge: 2019-05-22 | Disposition: A | Discharge status: Home / Self Care | Payer: Medicare HMO | Source: Ambulatory Visit | Attending: Internal Medicine | Admitting: Internal Medicine

## 2019-05-22 ENCOUNTER — Encounter: Payer: Self-pay | Admitting: Internal Medicine

## 2019-05-22 DIAGNOSIS — R0789 Other chest pain: Secondary | ICD-10-CM | POA: Diagnosis not present

## 2019-05-22 DIAGNOSIS — R05 Cough: Secondary | ICD-10-CM | POA: Diagnosis present

## 2019-05-22 DIAGNOSIS — U071 COVID-19: Secondary | ICD-10-CM

## 2019-05-22 DIAGNOSIS — R059 Cough, unspecified: Secondary | ICD-10-CM

## 2019-05-25 ENCOUNTER — Encounter: Payer: Self-pay | Admitting: Internal Medicine

## 2019-05-25 NOTE — Assessment & Plan Note (Signed)
Recently diagnosed with covid.  Is feeling some better.  Persistent cough and some chest tightness. Will check cxr.  Continue symptomatic treatment.  Continue self quarantine.  Follow closely.  Call with update.

## 2019-05-25 NOTE — Assessment & Plan Note (Signed)
Improved, but still with chest tightness and cough.  Check cxr.  Treat symptoms.  Self quarantine.  Follow closely.

## 2019-05-25 NOTE — Assessment & Plan Note (Signed)
Check lipid profile with next labs.

## 2019-06-04 DIAGNOSIS — M5412 Radiculopathy, cervical region: Secondary | ICD-10-CM | POA: Diagnosis not present

## 2019-06-04 DIAGNOSIS — M5033 Other cervical disc degeneration, cervicothoracic region: Secondary | ICD-10-CM | POA: Diagnosis not present

## 2019-06-04 DIAGNOSIS — M9901 Segmental and somatic dysfunction of cervical region: Secondary | ICD-10-CM | POA: Diagnosis not present

## 2019-06-04 DIAGNOSIS — M9903 Segmental and somatic dysfunction of lumbar region: Secondary | ICD-10-CM | POA: Diagnosis not present

## 2019-06-10 ENCOUNTER — Other Ambulatory Visit: Payer: Self-pay | Admitting: Internal Medicine

## 2019-06-13 DIAGNOSIS — M19071 Primary osteoarthritis, right ankle and foot: Secondary | ICD-10-CM | POA: Diagnosis not present

## 2019-06-13 DIAGNOSIS — M19072 Primary osteoarthritis, left ankle and foot: Secondary | ICD-10-CM | POA: Diagnosis not present

## 2019-06-18 ENCOUNTER — Ambulatory Visit: Payer: Medicare HMO | Admitting: Internal Medicine

## 2019-06-18 DIAGNOSIS — M5412 Radiculopathy, cervical region: Secondary | ICD-10-CM | POA: Diagnosis not present

## 2019-06-18 DIAGNOSIS — M5033 Other cervical disc degeneration, cervicothoracic region: Secondary | ICD-10-CM | POA: Diagnosis not present

## 2019-06-18 DIAGNOSIS — M9903 Segmental and somatic dysfunction of lumbar region: Secondary | ICD-10-CM | POA: Diagnosis not present

## 2019-06-18 DIAGNOSIS — M9901 Segmental and somatic dysfunction of cervical region: Secondary | ICD-10-CM | POA: Diagnosis not present

## 2019-06-24 DIAGNOSIS — M9901 Segmental and somatic dysfunction of cervical region: Secondary | ICD-10-CM | POA: Diagnosis not present

## 2019-06-24 DIAGNOSIS — M5033 Other cervical disc degeneration, cervicothoracic region: Secondary | ICD-10-CM | POA: Diagnosis not present

## 2019-06-24 DIAGNOSIS — M5412 Radiculopathy, cervical region: Secondary | ICD-10-CM | POA: Diagnosis not present

## 2019-06-24 DIAGNOSIS — M9903 Segmental and somatic dysfunction of lumbar region: Secondary | ICD-10-CM | POA: Diagnosis not present

## 2019-07-04 ENCOUNTER — Telehealth: Payer: Self-pay | Admitting: Internal Medicine

## 2019-07-04 NOTE — Telephone Encounter (Signed)
How long after COVID are you recommending them wait before they get the vaccine

## 2019-07-04 NOTE — Telephone Encounter (Signed)
Patient is aware 

## 2019-07-04 NOTE — Telephone Encounter (Signed)
Pt was wanting to know if she could get the Covid vaccine if she already had Covid

## 2019-07-04 NOTE — Telephone Encounter (Signed)
It is expected that the antibodies at least last 90 days.  (we do not know for sure at this point).  What is recommended that towards the end of 90 days - can get vaccine.

## 2019-07-23 ENCOUNTER — Other Ambulatory Visit: Payer: Self-pay | Admitting: Internal Medicine

## 2019-08-16 ENCOUNTER — Other Ambulatory Visit: Payer: Medicare HMO

## 2019-08-20 ENCOUNTER — Ambulatory Visit: Payer: Medicare HMO | Admitting: Internal Medicine

## 2019-09-09 DIAGNOSIS — M9901 Segmental and somatic dysfunction of cervical region: Secondary | ICD-10-CM | POA: Diagnosis not present

## 2019-09-09 DIAGNOSIS — M5412 Radiculopathy, cervical region: Secondary | ICD-10-CM | POA: Diagnosis not present

## 2019-09-09 DIAGNOSIS — M9903 Segmental and somatic dysfunction of lumbar region: Secondary | ICD-10-CM | POA: Diagnosis not present

## 2019-09-09 DIAGNOSIS — M5033 Other cervical disc degeneration, cervicothoracic region: Secondary | ICD-10-CM | POA: Diagnosis not present

## 2019-09-16 DIAGNOSIS — M5412 Radiculopathy, cervical region: Secondary | ICD-10-CM | POA: Diagnosis not present

## 2019-09-16 DIAGNOSIS — M5033 Other cervical disc degeneration, cervicothoracic region: Secondary | ICD-10-CM | POA: Diagnosis not present

## 2019-09-16 DIAGNOSIS — M9903 Segmental and somatic dysfunction of lumbar region: Secondary | ICD-10-CM | POA: Diagnosis not present

## 2019-09-16 DIAGNOSIS — M9901 Segmental and somatic dysfunction of cervical region: Secondary | ICD-10-CM | POA: Diagnosis not present

## 2019-09-17 DIAGNOSIS — D2262 Melanocytic nevi of left upper limb, including shoulder: Secondary | ICD-10-CM | POA: Diagnosis not present

## 2019-09-17 DIAGNOSIS — L821 Other seborrheic keratosis: Secondary | ICD-10-CM | POA: Diagnosis not present

## 2019-09-17 DIAGNOSIS — L820 Inflamed seborrheic keratosis: Secondary | ICD-10-CM | POA: Diagnosis not present

## 2019-09-17 DIAGNOSIS — L565 Disseminated superficial actinic porokeratosis (DSAP): Secondary | ICD-10-CM | POA: Diagnosis not present

## 2019-09-17 DIAGNOSIS — L814 Other melanin hyperpigmentation: Secondary | ICD-10-CM | POA: Diagnosis not present

## 2019-09-17 DIAGNOSIS — L570 Actinic keratosis: Secondary | ICD-10-CM | POA: Diagnosis not present

## 2019-09-17 DIAGNOSIS — D1801 Hemangioma of skin and subcutaneous tissue: Secondary | ICD-10-CM | POA: Diagnosis not present

## 2019-09-17 DIAGNOSIS — D225 Melanocytic nevi of trunk: Secondary | ICD-10-CM | POA: Diagnosis not present

## 2019-09-17 DIAGNOSIS — D224 Melanocytic nevi of scalp and neck: Secondary | ICD-10-CM | POA: Diagnosis not present

## 2019-09-17 DIAGNOSIS — Z85828 Personal history of other malignant neoplasm of skin: Secondary | ICD-10-CM | POA: Diagnosis not present

## 2019-09-18 DIAGNOSIS — M9903 Segmental and somatic dysfunction of lumbar region: Secondary | ICD-10-CM | POA: Diagnosis not present

## 2019-09-18 DIAGNOSIS — M5033 Other cervical disc degeneration, cervicothoracic region: Secondary | ICD-10-CM | POA: Diagnosis not present

## 2019-09-18 DIAGNOSIS — M5412 Radiculopathy, cervical region: Secondary | ICD-10-CM | POA: Diagnosis not present

## 2019-09-18 DIAGNOSIS — M9901 Segmental and somatic dysfunction of cervical region: Secondary | ICD-10-CM | POA: Diagnosis not present

## 2019-09-19 ENCOUNTER — Other Ambulatory Visit (INDEPENDENT_AMBULATORY_CARE_PROVIDER_SITE_OTHER): Payer: Medicare HMO

## 2019-09-19 ENCOUNTER — Other Ambulatory Visit: Payer: Self-pay

## 2019-09-19 DIAGNOSIS — R739 Hyperglycemia, unspecified: Secondary | ICD-10-CM | POA: Diagnosis not present

## 2019-09-19 DIAGNOSIS — E7800 Pure hypercholesterolemia, unspecified: Secondary | ICD-10-CM | POA: Diagnosis not present

## 2019-09-19 LAB — BASIC METABOLIC PANEL WITH GFR
BUN: 17 mg/dL (ref 6–23)
CO2: 28 meq/L (ref 19–32)
Calcium: 9.6 mg/dL (ref 8.4–10.5)
Chloride: 103 meq/L (ref 96–112)
Creatinine, Ser: 0.71 mg/dL (ref 0.40–1.20)
GFR: 80.36 mL/min
Glucose, Bld: 93 mg/dL (ref 70–99)
Potassium: 4 meq/L (ref 3.5–5.1)
Sodium: 140 meq/L (ref 135–145)

## 2019-09-19 LAB — LIPID PANEL
Cholesterol: 199 mg/dL (ref 0–200)
HDL: 55 mg/dL
LDL Cholesterol: 121 mg/dL — ABNORMAL HIGH (ref 0–99)
NonHDL: 143.61
Total CHOL/HDL Ratio: 4
Triglycerides: 114 mg/dL (ref 0.0–149.0)
VLDL: 22.8 mg/dL (ref 0.0–40.0)

## 2019-09-19 LAB — HEPATIC FUNCTION PANEL
ALT: 13 U/L (ref 0–35)
AST: 19 U/L (ref 0–37)
Albumin: 4.5 g/dL (ref 3.5–5.2)
Alkaline Phosphatase: 66 U/L (ref 39–117)
Bilirubin, Direct: 0.1 mg/dL (ref 0.0–0.3)
Total Bilirubin: 0.5 mg/dL (ref 0.2–1.2)
Total Protein: 7 g/dL (ref 6.0–8.3)

## 2019-09-19 LAB — HEMOGLOBIN A1C: Hgb A1c MFr Bld: 5.5 % (ref 4.6–6.5)

## 2019-09-23 ENCOUNTER — Ambulatory Visit (INDEPENDENT_AMBULATORY_CARE_PROVIDER_SITE_OTHER): Payer: Medicare HMO | Admitting: Internal Medicine

## 2019-09-23 ENCOUNTER — Other Ambulatory Visit: Payer: Self-pay

## 2019-09-23 ENCOUNTER — Ambulatory Visit: Payer: Medicare HMO | Admitting: Internal Medicine

## 2019-09-23 DIAGNOSIS — K2100 Gastro-esophageal reflux disease with esophagitis, without bleeding: Secondary | ICD-10-CM | POA: Diagnosis not present

## 2019-09-23 DIAGNOSIS — F439 Reaction to severe stress, unspecified: Secondary | ICD-10-CM

## 2019-09-23 DIAGNOSIS — E7800 Pure hypercholesterolemia, unspecified: Secondary | ICD-10-CM

## 2019-09-23 DIAGNOSIS — E039 Hypothyroidism, unspecified: Secondary | ICD-10-CM | POA: Diagnosis not present

## 2019-09-23 DIAGNOSIS — M542 Cervicalgia: Secondary | ICD-10-CM | POA: Diagnosis not present

## 2019-09-23 DIAGNOSIS — R232 Flushing: Secondary | ICD-10-CM | POA: Diagnosis not present

## 2019-09-23 DIAGNOSIS — R69 Illness, unspecified: Secondary | ICD-10-CM | POA: Diagnosis not present

## 2019-09-23 LAB — CBC WITH DIFFERENTIAL/PLATELET
Basophils Absolute: 0 10*3/uL (ref 0.0–0.1)
Basophils Relative: 0.8 % (ref 0.0–3.0)
Eosinophils Absolute: 0.1 10*3/uL (ref 0.0–0.7)
Eosinophils Relative: 1.5 % (ref 0.0–5.0)
HCT: 38.1 % (ref 36.0–46.0)
Hemoglobin: 13 g/dL (ref 12.0–15.0)
Lymphocytes Relative: 39 % (ref 12.0–46.0)
Lymphs Abs: 2.2 10*3/uL (ref 0.7–4.0)
MCHC: 34.2 g/dL (ref 30.0–36.0)
MCV: 96.1 fl (ref 78.0–100.0)
Monocytes Absolute: 0.5 10*3/uL (ref 0.1–1.0)
Monocytes Relative: 8.8 % (ref 3.0–12.0)
Neutro Abs: 2.8 10*3/uL (ref 1.4–7.7)
Neutrophils Relative %: 49.9 % (ref 43.0–77.0)
Platelets: 252 10*3/uL (ref 150.0–400.0)
RBC: 3.97 Mil/uL (ref 3.87–5.11)
RDW: 12.1 % (ref 11.5–15.5)
WBC: 5.5 10*3/uL (ref 4.0–10.5)

## 2019-09-23 NOTE — Progress Notes (Signed)
Patient ID: HOLLIDAY SHEAFFER, female   DOB: 09-Jan-1946, 74 y.o.   MRN: [id number]   Subjective:    Patient ID: [name], female    DOB: 1946/02/04, 74 y.o.   MRN: [id number]  HPI This visit occurred during the SARS-CoV-2 public health emergency.  Safety protocols were in place, including screening questions prior to the visit, additional usage of staff PPE, and extensive cleaning of exam room while observing appropriate contact time as indicated for disinfecting solutions.  Patient here for a scheduled follow up. She reports she is doing relatively well.  She previously saw ortho (Dr Delilah Shan) for knee pain.  S/p injection.   Also evaluated for foot and ankle pain.  She is seeing Dr Francisca December for pain - cervical region.  Gets "adjustments".  Overall stable.  She has been in [place] from 06/2019 - 08/2019.  Was walking 2 miles per day.  No chest pain with increased activity or exertion.  Taking omeprazole 1m q day.  Concern regarding some upper GI issues.  Weight 140 pounds.  Has f/u with GI this week.  No abdominal pain.  Bowels moving.  Breathing stable.    Past Medical History:  Diagnosis Date  . Arthritis   . Environmental allergies   . Esophagitis    [place]rets, followed by Dr NVennie Homans . GERD (gastroesophageal reflux disease)   . Hypercholesterolemia   . Hypothyroidism    Past Surgical History:  Procedure Laterality Date  . [place] . KNEE ARTHROSCOPY  12/03/07   left  . KNEE ARTHROSCOPY  06/25/08   left  . NASAL SINUS SURGERY  12/06  . REPLACEMENT TOTAL KNEE  01/04/09   left  . RHINOPLASTY  1986  . VAGINAL HYSTERECTOMY  1980   ovaries left in place  . WRIST SURGERY  1970   cyst removal   Family History  Problem Relation Age of Onset  . Heart disease Father        heart attack  . Diabetes Father   . Hyperlipidemia Paternal Grandmother   . Diabetes Paternal Grandmother   . Stroke Paternal Grandmother   . Hyperlipidemia Paternal Grandfather   . Diabetes Paternal  Grandfather   . Prostate cancer Maternal Uncle   . Pancreatic cancer Cousin   . Multiple myeloma Brother   . Breast cancer Maternal Aunt   . Breast cancer Maternal Aunt    Social History   Socioeconomic History  . Marital status: Married    Spouse name: Not on file  . Number of children: Not on file  . Years of education: Not on file  . Highest education level: Not on file  Occupational History  . Not on file  Tobacco Use  . Smoking status: Former Smoker    Years: 5.00    Types: Cigarettes  . Smokeless tobacco: Never Used  Substance and Sexual Activity  . Alcohol use: Yes    Alcohol/week: 0.0 standard drinks    Comment: rare  . Drug use: No  . Sexual activity: Yes  Other Topics Concern  . Not on file  Social History Narrative  . Not on file   Social Determinants of Health   Financial Resource Strain:   . Difficulty of Paying Living Expenses:   Food Insecurity:   . Worried About RCharity fundraiserin the Last Year:   . RArboriculturistin the Last Year:   Transportation Needs:   . LFilm/video editor(Medical):   .Marland Kitchen  Lack of Transportation (Non-Medical):   Physical Activity: Insufficiently Active  . Days of Exercise per Week: 3 days  . Minutes of Exercise per Session: 30 min  Stress: No Stress Concern Present  . Feeling of Stress : Not at all  Social Connections:   . Frequency of Communication with Friends and Family:   . Frequency of Social Gatherings with Friends and Family:   . Attends Religious Services:   . Active Member of Clubs or Organizations:   . Attends Archivist Meetings:   Marland Kitchen Marital Status:     Outpatient Encounter Medications as of 09/23/2019  Medication Sig  . Ascorbic Acid (VITAMIN C) 500 MG CAPS Take 500 mg by mouth daily.  . busPIRone (BUSPAR) 5 MG tablet Take 5 mg by mouth daily as needed.  . Coenzyme Q10 (COQ-10 PO) Take 120 mg by mouth daily.  . COLLAGEN PO Take 11 mg by mouth daily.  . Cyanocobalamin (B-12 PO) Take 5,000  mcg by mouth daily.  . folic acid (FOLVITE) 160 MCG tablet Take 800 mcg by mouth 2 (two) times daily.  . Omega-3 Fatty Acids (FISH OIL PO) Take 1,200 mg by mouth daily.  Marland Kitchen acetaminophen (TYLENOL) 500 MG tablet Take 500 mg by mouth every 6 (six) hours as needed.  Marland Kitchen acyclovir (ZOVIRAX) 800 MG tablet Take 800 mg by mouth 2 (two) times daily as needed.   Marland Kitchen BIOTIN 5000 PO Take 10,000 mcg by mouth daily.   . budesonide (PULMICORT) 1 MG/2ML nebulizer solution ADD 1ML OF MEDICATION TO 240ML OF SALINE IN SALINE IRRIGATION BOTTLE; IRRIGATE SINUSES WITH 120ML THROUGH EACH NOSTRIL TWICE DAILY  . BUDESONIDE PO 2 (two) times daily.  . Cholecalciferol (VITAMIN D PO) Take 10,000 Units by mouth daily.   . diclofenac sodium (VOLTAREN) 1 % GEL Apply 1 application topically as needed.  Marland Kitchen ELDERBERRY PO Take 125 mg by mouth daily.   . fluocinonide cream (LIDEX) 1.09 % Apply 1 application topically 2 (two) times daily as needed.   . fluticasone (FLONASE) 50 MCG/ACT nasal spray Place 2 sprays into both nostrils daily. (Patient taking differently: Place 2 sprays into both nostrils daily as needed. )  . Hypertonic Nasal Wash (SINUS RINSE NA) Place into the nose as needed.  Marland Kitchen LEVOFLOXACIN PO 2 (two) times daily. levofloxacin 136m/ml cash  ADD 1ML OF MEDICATION TO 240ML OF SALINE IN SALINE IRRIGATION BOTTLE; IRRIGATE  . levothyroxine (SYNTHROID) 50 MCG tablet TAKE 1 TABLET EVERY OTHER  DAY  . levothyroxine (SYNTHROID) 75 MCG tablet TAKE 1 TABLET EVERY OTHER  DAY  . omeprazole (PRILOSEC) 40 MG capsule Take 40 mg by mouth daily.  . Probiotic Product (PROBIOTIC DAILY PO) Take 1 capsule by mouth.  . rosuvastatin (CRESTOR) 5 MG tablet Take 1 tablet (5 mg total) by mouth daily.  . vitamin E 400 UNIT capsule Take 400 Units by mouth daily.  . [DISCONTINUED] Ascorbic Acid (VITAMIN C) 1000 MG tablet Take 1,000 mg by mouth daily.  . [DISCONTINUED] Coenzyme Q10 60 MG TABS Take 120 mg by mouth daily.  . [DISCONTINUED] MAGNESIUM PO  Take 1 tablet by mouth.  . [DISCONTINUED] Multiple Vitamin (MULTIVITAMIN) LIQD Take 5 mLs by mouth daily.  . [DISCONTINUED] Omega-3 Fatty Acids (FISH OIL) 1360 MG CAPS Take by mouth daily.  . [DISCONTINUED] TOLAK 4 % CREA Apply daily from knees down  . [DISCONTINUED] triamcinolone cream (KENALOG) 0.1 % PLEASE SEE ATTACHED FOR DETAILED DIRECTIONS   No facility-administered encounter medications on file as of 09/23/2019.  Review of Systems  Constitutional: Negative for appetite change and fever.  HENT: Negative for congestion and sinus pressure.   Respiratory: Negative for cough, chest tightness and shortness of breath.   Cardiovascular: Negative for chest pain, palpitations and leg swelling.  Gastrointestinal: Negative for abdominal pain, diarrhea, nausea and vomiting.  Genitourinary: Negative for difficulty urinating and dysuria.  Musculoskeletal: Negative for joint swelling and myalgias.  Skin: Negative for color change and rash.  Neurological: Negative for dizziness, light-headedness and headaches.  Psychiatric/Behavioral: Negative for agitation and dysphoric mood.       Objective:    Physical Exam Vitals reviewed.  Constitutional:      General: She is not in acute distress.    Appearance: Normal appearance.  HENT:     Head: Normocephalic and atraumatic.     Right Ear: External ear normal.     Left Ear: External ear normal.  Eyes:     General: No scleral icterus.       Right eye: No discharge.        Left eye: No discharge.  Neck:     Thyroid: No thyromegaly.  Cardiovascular:     Rate and Rhythm: Normal rate and regular rhythm.  Pulmonary:     Effort: No respiratory distress.     Breath sounds: Normal breath sounds. No wheezing.  Abdominal:     General: Bowel sounds are normal.     Palpations: Abdomen is soft.     Tenderness: There is no abdominal tenderness.  Musculoskeletal:        General: No swelling or tenderness.     Cervical back: Neck supple. No  tenderness.  Lymphadenopathy:     Cervical: No cervical adenopathy.  Skin:    Findings: No erythema or rash.  Neurological:     Mental Status: She is alert.  Psychiatric:        Mood and Affect: Mood normal.        Behavior: Behavior normal.     BP 118/70   Pulse 64   Temp (!) 96.4 F (35.8 C)   Resp 16   Wt 144 lb 9.6 oz (65.6 kg)   SpO2 97%   BMI 24.82 kg/m  Wt Readings from Last 3 Encounters:  09/23/19 144 lb 9.6 oz (65.6 kg)  05/21/19 144 lb (65.3 kg)  05/13/19 144 lb 3.2 oz (65.4 kg)     Lab Results  Component Value Date   WBC 5.5 09/23/2019   HGB 13.0 09/23/2019   HCT 38.1 09/23/2019   PLT 252.0 09/23/2019   GLUCOSE 93 09/19/2019   CHOL 199 09/19/2019   TRIG 114.0 09/19/2019   HDL 55.00 09/19/2019   LDLDIRECT 123.4 02/27/2013   LDLCALC 121 (H) 09/19/2019   ALT 13 09/19/2019   AST 19 09/19/2019   NA 140 09/19/2019   K 4.0 09/19/2019   CL 103 09/19/2019   CREATININE 0.71 09/19/2019   BUN 17 09/19/2019   CO2 28 09/19/2019   TSH 0.72 09/23/2019   HGBA1C 5.5 09/19/2019    DG Chest 2 View  Result Date: 05/22/2019 CLINICAL DATA:  COVID-19 positive.  Chest tightness EXAM: CHEST - 2 VIEW COMPARISON:  None. FINDINGS: Pulmonary hyperinflation. No acute infiltrate or effusion. Negative for heart failure. Mild atherosclerotic calcification aortic arch. IMPRESSION: No active cardiopulmonary disease. Electronically Signed   By: Charles  Clark M.D.   On: 05/22/2019 17:12       Assessment & Plan:   Problem List Items Addressed This Visit      GERD (gastroesophageal reflux disease)    Followed by GI.  On omeprazole.  Was questioning upper symptoms.  Has f/u with GI this week.        Hot flashes    Has not been on estrogen recently.  Describes persistent hot flashes/flushing.  Check tsh and cbc.  Recent glucose - ok.  Discussed possible endocrinology referral.        Relevant Orders   CBC with Differential/Platelet (Completed)   TSH (Completed)    Hypercholesteremia    On crestor.  Low cholesterol diet and exercise.  Follow lipid panel.        Hypothyroidism    On thyroid replacement.  Follow tsh.       Neck pain    Seeing Dr Beshel.  Stable.        Stress    Appears to be handling things well.  Follow.            Charlene Scott, MD 

## 2019-09-24 LAB — TSH: TSH: 0.72 u[IU]/mL (ref 0.35–4.50)

## 2019-09-25 ENCOUNTER — Other Ambulatory Visit: Payer: Self-pay | Admitting: Internal Medicine

## 2019-09-25 DIAGNOSIS — M5412 Radiculopathy, cervical region: Secondary | ICD-10-CM | POA: Diagnosis not present

## 2019-09-25 DIAGNOSIS — M5033 Other cervical disc degeneration, cervicothoracic region: Secondary | ICD-10-CM | POA: Diagnosis not present

## 2019-09-25 DIAGNOSIS — M9901 Segmental and somatic dysfunction of cervical region: Secondary | ICD-10-CM | POA: Diagnosis not present

## 2019-09-25 DIAGNOSIS — R232 Flushing: Secondary | ICD-10-CM

## 2019-09-25 DIAGNOSIS — M9903 Segmental and somatic dysfunction of lumbar region: Secondary | ICD-10-CM | POA: Diagnosis not present

## 2019-09-25 NOTE — Progress Notes (Signed)
Order placed for referral to endocrinology.  

## 2019-09-26 DIAGNOSIS — R1013 Epigastric pain: Secondary | ICD-10-CM | POA: Diagnosis not present

## 2019-09-26 DIAGNOSIS — K449 Diaphragmatic hernia without obstruction or gangrene: Secondary | ICD-10-CM | POA: Diagnosis not present

## 2019-09-26 DIAGNOSIS — K219 Gastro-esophageal reflux disease without esophagitis: Secondary | ICD-10-CM | POA: Diagnosis not present

## 2019-09-26 DIAGNOSIS — K5730 Diverticulosis of large intestine without perforation or abscess without bleeding: Secondary | ICD-10-CM | POA: Diagnosis not present

## 2019-09-29 ENCOUNTER — Encounter: Payer: Self-pay | Admitting: Internal Medicine

## 2019-09-29 NOTE — Assessment & Plan Note (Signed)
Seeing Dr Francisca December.  Stable.

## 2019-09-29 NOTE — Assessment & Plan Note (Signed)
On thyroid replacement.  Follow tsh.  

## 2019-09-29 NOTE — Assessment & Plan Note (Signed)
Has not been on estrogen recently.  Describes persistent hot flashes/flushing.  Check tsh and cbc.  Recent glucose - ok.  Discussed possible endocrinology referral.

## 2019-09-29 NOTE — Assessment & Plan Note (Signed)
Appears to be handling things well.  Follow.  

## 2019-09-29 NOTE — Assessment & Plan Note (Addendum)
Followed by GI.  On omeprazole.  Was questioning upper symptoms.  Has f/u with GI this week.

## 2019-09-29 NOTE — Assessment & Plan Note (Signed)
On crestor.  Low cholesterol diet and exercise.  Follow lipid panel.  

## 2019-10-02 ENCOUNTER — Telehealth: Payer: Self-pay | Admitting: Internal Medicine

## 2019-10-02 DIAGNOSIS — M5412 Radiculopathy, cervical region: Secondary | ICD-10-CM | POA: Diagnosis not present

## 2019-10-02 DIAGNOSIS — M5033 Other cervical disc degeneration, cervicothoracic region: Secondary | ICD-10-CM | POA: Diagnosis not present

## 2019-10-02 DIAGNOSIS — M9901 Segmental and somatic dysfunction of cervical region: Secondary | ICD-10-CM | POA: Diagnosis not present

## 2019-10-02 DIAGNOSIS — M9903 Segmental and somatic dysfunction of lumbar region: Secondary | ICD-10-CM | POA: Diagnosis not present

## 2019-10-02 MED ORDER — FLUTICASONE PROPIONATE 50 MCG/ACT NA SUSP: 2.0000 | Freq: Every day | NASAL | 6 refills | Status: DC

## 2019-10-02 NOTE — Telephone Encounter (Signed)
Pt called and wants a refill on flonase sent to [facility] mail order [facility].

## 2019-10-09 DIAGNOSIS — M9903 Segmental and somatic dysfunction of lumbar region: Secondary | ICD-10-CM | POA: Diagnosis not present

## 2019-10-09 DIAGNOSIS — M5412 Radiculopathy, cervical region: Secondary | ICD-10-CM | POA: Diagnosis not present

## 2019-10-09 DIAGNOSIS — M5033 Other cervical disc degeneration, cervicothoracic region: Secondary | ICD-10-CM | POA: Diagnosis not present

## 2019-10-09 DIAGNOSIS — M9901 Segmental and somatic dysfunction of cervical region: Secondary | ICD-10-CM | POA: Diagnosis not present

## 2019-10-14 DIAGNOSIS — M5033 Other cervical disc degeneration, cervicothoracic region: Secondary | ICD-10-CM | POA: Diagnosis not present

## 2019-10-14 DIAGNOSIS — M5412 Radiculopathy, cervical region: Secondary | ICD-10-CM | POA: Diagnosis not present

## 2019-10-14 DIAGNOSIS — M9901 Segmental and somatic dysfunction of cervical region: Secondary | ICD-10-CM | POA: Diagnosis not present

## 2019-10-14 DIAGNOSIS — M9903 Segmental and somatic dysfunction of lumbar region: Secondary | ICD-10-CM | POA: Diagnosis not present

## 2019-10-16 DIAGNOSIS — M9903 Segmental and somatic dysfunction of lumbar region: Secondary | ICD-10-CM | POA: Diagnosis not present

## 2019-10-16 DIAGNOSIS — M5412 Radiculopathy, cervical region: Secondary | ICD-10-CM | POA: Diagnosis not present

## 2019-10-16 DIAGNOSIS — M9901 Segmental and somatic dysfunction of cervical region: Secondary | ICD-10-CM | POA: Diagnosis not present

## 2019-10-16 DIAGNOSIS — M5033 Other cervical disc degeneration, cervicothoracic region: Secondary | ICD-10-CM | POA: Diagnosis not present

## 2019-10-18 ENCOUNTER — Ambulatory Visit: Payer: Medicare HMO | Attending: Internal Medicine

## 2019-10-18 ENCOUNTER — Other Ambulatory Visit: Payer: Self-pay | Admitting: Internal Medicine

## 2019-10-18 DIAGNOSIS — Z23 Encounter for immunization: Secondary | ICD-10-CM

## 2019-10-18 NOTE — Progress Notes (Signed)
   Covid-19 Vaccination Clinic  Name:  [name]    MRN: [id number] DOB: 1946-04-02  10/18/2019  [name] was observed post Covid-19 immunization for 15 minutes without incident. She was provided with Vaccine Information Sheet and instruction to access the V-Safe system.   [name] was instructed to call 911 with any severe reactions post vaccine: Marland Kitchen Difficulty breathing  . Swelling of face and throat  . A fast heartbeat  . A bad rash all over body  . Dizziness and weakness   Immunizations Administered    Name Date Dose VIS Date [place] COVID-19 Vaccine 10/18/2019  8:03 AM 0.3 mL 08/07/2018 Intramuscular   Manufacturer: [place]   Lot: [id number]   [place]: [id number]

## 2019-10-23 DIAGNOSIS — M9903 Segmental and somatic dysfunction of lumbar region: Secondary | ICD-10-CM | POA: Diagnosis not present

## 2019-10-23 DIAGNOSIS — M9901 Segmental and somatic dysfunction of cervical region: Secondary | ICD-10-CM | POA: Diagnosis not present

## 2019-10-23 DIAGNOSIS — M5033 Other cervical disc degeneration, cervicothoracic region: Secondary | ICD-10-CM | POA: Diagnosis not present

## 2019-10-23 DIAGNOSIS — M5412 Radiculopathy, cervical region: Secondary | ICD-10-CM | POA: Diagnosis not present

## 2019-11-04 DIAGNOSIS — R591 Generalized enlarged lymph nodes: Secondary | ICD-10-CM | POA: Diagnosis not present

## 2019-11-04 DIAGNOSIS — J324 Chronic pansinusitis: Secondary | ICD-10-CM | POA: Diagnosis not present

## 2019-11-04 DIAGNOSIS — J301 Allergic rhinitis due to pollen: Secondary | ICD-10-CM | POA: Diagnosis not present

## 2019-11-05 DIAGNOSIS — M19072 Primary osteoarthritis, left ankle and foot: Secondary | ICD-10-CM | POA: Diagnosis not present

## 2019-11-05 DIAGNOSIS — M19071 Primary osteoarthritis, right ankle and foot: Secondary | ICD-10-CM | POA: Diagnosis not present

## 2019-11-12 ENCOUNTER — Ambulatory Visit: Payer: Medicare HMO | Attending: Internal Medicine

## 2019-11-12 DIAGNOSIS — Z23 Encounter for immunization: Secondary | ICD-10-CM

## 2019-11-12 NOTE — Progress Notes (Signed)
   Covid-19 Vaccination Clinic  Name:  [name]    MRN: [id number] DOB: [birth year]  11/12/2019  [name] was observed post Covid-19 immunization for 15 minutes without incident. She was provided with Vaccine Information Sheet and instruction to access the V-Safe system.   [name] was instructed to call 911 with any severe reactions post vaccine: Marland Kitchen Difficulty breathing  . Swelling of face and throat  . A fast heartbeat  . A bad rash all over body  . Dizziness and weakness   Immunizations Administered    Name Date Dose VIS Date Route   Pfizer COVID-19 Vaccine 11/12/2019  8:09 AM 0.3 mL 08/07/2018 Intramuscular   Manufacturer: [place]   Lot: [id number]   [place]: [id number]

## 2019-11-19 DIAGNOSIS — H0288A Meibomian gland dysfunction right eye, upper and lower eyelids: Secondary | ICD-10-CM | POA: Diagnosis not present

## 2019-11-19 DIAGNOSIS — H0288B Meibomian gland dysfunction left eye, upper and lower eyelids: Secondary | ICD-10-CM | POA: Diagnosis not present

## 2019-11-19 DIAGNOSIS — H5203 Hypermetropia, bilateral: Secondary | ICD-10-CM | POA: Diagnosis not present

## 2019-11-19 DIAGNOSIS — H2513 Age-related nuclear cataract, bilateral: Secondary | ICD-10-CM | POA: Diagnosis not present

## 2019-11-19 DIAGNOSIS — H04123 Dry eye syndrome of bilateral lacrimal glands: Secondary | ICD-10-CM | POA: Diagnosis not present

## 2019-11-20 DIAGNOSIS — K319 Disease of stomach and duodenum, unspecified: Secondary | ICD-10-CM | POA: Diagnosis not present

## 2019-11-20 DIAGNOSIS — R1013 Epigastric pain: Secondary | ICD-10-CM | POA: Diagnosis not present

## 2019-11-20 DIAGNOSIS — K219 Gastro-esophageal reflux disease without esophagitis: Secondary | ICD-10-CM | POA: Diagnosis not present

## 2019-11-20 DIAGNOSIS — K2970 Gastritis, unspecified, without bleeding: Secondary | ICD-10-CM | POA: Diagnosis not present

## 2019-12-05 DIAGNOSIS — E039 Hypothyroidism, unspecified: Secondary | ICD-10-CM | POA: Diagnosis not present

## 2019-12-05 DIAGNOSIS — N951 Menopausal and female climacteric states: Secondary | ICD-10-CM | POA: Diagnosis not present

## 2019-12-10 DIAGNOSIS — M9903 Segmental and somatic dysfunction of lumbar region: Secondary | ICD-10-CM | POA: Diagnosis not present

## 2019-12-10 DIAGNOSIS — M5412 Radiculopathy, cervical region: Secondary | ICD-10-CM | POA: Diagnosis not present

## 2019-12-10 DIAGNOSIS — M5033 Other cervical disc degeneration, cervicothoracic region: Secondary | ICD-10-CM | POA: Diagnosis not present

## 2019-12-10 DIAGNOSIS — M9901 Segmental and somatic dysfunction of cervical region: Secondary | ICD-10-CM | POA: Diagnosis not present

## 2019-12-17 DIAGNOSIS — H0288A Meibomian gland dysfunction right eye, upper and lower eyelids: Secondary | ICD-10-CM | POA: Diagnosis not present

## 2019-12-17 DIAGNOSIS — H0288B Meibomian gland dysfunction left eye, upper and lower eyelids: Secondary | ICD-10-CM | POA: Diagnosis not present

## 2019-12-17 DIAGNOSIS — H04123 Dry eye syndrome of bilateral lacrimal glands: Secondary | ICD-10-CM | POA: Diagnosis not present

## 2019-12-17 DIAGNOSIS — Z0100 Encounter for examination of eyes and vision without abnormal findings: Secondary | ICD-10-CM | POA: Diagnosis not present

## 2019-12-17 DIAGNOSIS — H2513 Age-related nuclear cataract, bilateral: Secondary | ICD-10-CM | POA: Diagnosis not present

## 2019-12-25 ENCOUNTER — Ambulatory Visit: Payer: Medicare HMO | Admitting: Internal Medicine

## 2019-12-31 ENCOUNTER — Ambulatory Visit: Payer: Medicare HMO | Admitting: Internal Medicine

## 2020-01-06 DIAGNOSIS — L570 Actinic keratosis: Secondary | ICD-10-CM | POA: Diagnosis not present

## 2020-01-06 DIAGNOSIS — L820 Inflamed seborrheic keratosis: Secondary | ICD-10-CM | POA: Diagnosis not present

## 2020-01-06 DIAGNOSIS — L565 Disseminated superficial actinic porokeratosis (DSAP): Secondary | ICD-10-CM | POA: Diagnosis not present

## 2020-01-06 DIAGNOSIS — L821 Other seborrheic keratosis: Secondary | ICD-10-CM | POA: Diagnosis not present

## 2020-01-08 DIAGNOSIS — M9903 Segmental and somatic dysfunction of lumbar region: Secondary | ICD-10-CM | POA: Diagnosis not present

## 2020-01-08 DIAGNOSIS — M9901 Segmental and somatic dysfunction of cervical region: Secondary | ICD-10-CM | POA: Diagnosis not present

## 2020-01-08 DIAGNOSIS — M5033 Other cervical disc degeneration, cervicothoracic region: Secondary | ICD-10-CM | POA: Diagnosis not present

## 2020-01-08 DIAGNOSIS — M5412 Radiculopathy, cervical region: Secondary | ICD-10-CM | POA: Diagnosis not present

## 2020-01-16 ENCOUNTER — Ambulatory Visit (INDEPENDENT_AMBULATORY_CARE_PROVIDER_SITE_OTHER): Payer: Medicare HMO | Admitting: Internal Medicine

## 2020-01-16 ENCOUNTER — Other Ambulatory Visit: Payer: Self-pay

## 2020-01-16 VITALS — BP 120/70 | HR 62 | Temp 98.2°F | Resp 16 | Ht 64.0 in | Wt 144.0 lb

## 2020-01-16 DIAGNOSIS — R195 Other fecal abnormalities: Secondary | ICD-10-CM

## 2020-01-16 DIAGNOSIS — Z1231 Encounter for screening mammogram for malignant neoplasm of breast: Secondary | ICD-10-CM

## 2020-01-16 DIAGNOSIS — K2100 Gastro-esophageal reflux disease with esophagitis, without bleeding: Secondary | ICD-10-CM

## 2020-01-16 DIAGNOSIS — E7800 Pure hypercholesterolemia, unspecified: Secondary | ICD-10-CM | POA: Diagnosis not present

## 2020-01-16 DIAGNOSIS — E039 Hypothyroidism, unspecified: Secondary | ICD-10-CM

## 2020-01-16 DIAGNOSIS — Z860100 Personal history of colon polyps, unspecified: Secondary | ICD-10-CM

## 2020-01-16 DIAGNOSIS — F439 Reaction to severe stress, unspecified: Secondary | ICD-10-CM | POA: Diagnosis not present

## 2020-01-16 DIAGNOSIS — R194 Change in bowel habit: Secondary | ICD-10-CM | POA: Insufficient documentation

## 2020-01-16 DIAGNOSIS — R739 Hyperglycemia, unspecified: Secondary | ICD-10-CM | POA: Diagnosis not present

## 2020-01-16 DIAGNOSIS — R232 Flushing: Secondary | ICD-10-CM | POA: Diagnosis not present

## 2020-01-16 DIAGNOSIS — R69 Illness, unspecified: Secondary | ICD-10-CM | POA: Diagnosis not present

## 2020-01-16 NOTE — Progress Notes (Signed)
Patient ID: [name], female   DOB: [birth year], 74 y.o.   MRN: [id number]   Subjective:    Patient ID: [name], female    DOB: [birth year], 74 y.o.   MRN: [id number]  HPI This visit occurred during the SARS-CoV-2 public health emergency.  Safety protocols were in place, including screening questions prior to the visit, additional usage of staff PPE, and extensive cleaning of exam room while observing appropriate contact time as indicated for disinfecting solutions.  Patient here for a scheduled follow up.  She has been having problems with hot flashes.  Saw endo.  Started on estroven.  States hot flashes may be some better.  Has only been on for a few weeks.  Has noticed some loose stool.  Had 3-4 bowel movements yesterday.  No bowel movement today.  She has been drinking a liquid supplement with tumeric, etc.  Loose stool.  Not daily.  Some formed stool in between.  Discussed possibility of the supplement contributing to her loose stool.  Pain left foot - s/p injection.  Helped.  Started yoga.  No chest pain or sob reported. Eating.  No nausea or vomiting.  Sees Dr Collene Mares.  EGD 11/20/19.  Taking PPI.  Also reports feeling of "hair" on the side of her face - one side and the will feel on opposite side.  Also has noticed on her arm.  No rash. Not constant.  No headache or head pain.  Desires no further w/up at this time.  Sees ENT for her persistent sinus issues.  Using a sinus rinse.     Past Medical History:  Diagnosis Date   Arthritis    Environmental allergies    Esophagitis    [place], followed by Dr Vennie Homans   GERD (gastroesophageal reflux disease)    Hypercholesterolemia    Hypothyroidism    Past Surgical History:  Procedure Laterality Date   ANKLE SURGERY  1967   KNEE ARTHROSCOPY  12/03/07   left   KNEE ARTHROSCOPY  06/25/08   left   NASAL SINUS SURGERY  12/06   REPLACEMENT TOTAL KNEE  01/04/09   left   RHINOPLASTY  1986   VAGINAL HYSTERECTOMY  1980   ovaries  left in place   WRIST SURGERY  1970   cyst removal   Family History  Problem Relation Age of Onset   Heart disease Father        heart attack   Diabetes Father    Hyperlipidemia Paternal Grandmother    Diabetes Paternal Grandmother    Stroke Paternal Grandmother    Hyperlipidemia Paternal Grandfather    Diabetes Paternal Grandfather    Prostate cancer Maternal Uncle    Pancreatic cancer Cousin    Multiple myeloma Brother    Breast cancer Maternal Aunt    Breast cancer Maternal Aunt    Social History   Socioeconomic History   Marital status: Married    Spouse name: Not on file   Number of children: Not on file   Years of education: Not on file   Highest education level: Not on file  Occupational History   Not on file  Tobacco Use   Smoking status: Former Smoker    Years: 5.00    Types: Cigarettes   Smokeless tobacco: Never Used  Scientific laboratory technician Use: Never used  Substance and Sexual Activity   Alcohol use: Yes    Alcohol/week: 0.0 standard drinks    Comment: rare   Drug  use: No   Sexual activity: Yes  Other Topics Concern   Not on file  Social History Narrative   Not on file   Social Determinants of Health   Financial Resource Strain:    Difficulty of Paying Living Expenses:   Food Insecurity:    Worried About Charity fundraiser in the Last Year:    Arboriculturist in the Last Year:   Transportation Needs:    Film/video editor (Medical):    Lack of Transportation (Non-Medical):   Physical Activity: Insufficiently Active   Days of Exercise per Week: 3 days   Minutes of Exercise per Session: 30 min  Stress: No Stress Concern Present   Feeling of Stress : Not at all  Social Connections:    Frequency of Communication with Friends and Family:    Frequency of Social Gatherings with Friends and Family:    Attends Religious Services:    Active Member of Clubs or Organizations:    Attends Archivist  Meetings:    Marital Status:     Outpatient Encounter Medications as of 01/16/2020  Medication Sig   acetaminophen (TYLENOL) 500 MG tablet Take 500 mg by mouth every 6 (six) hours as needed.   acyclovir (ZOVIRAX) 800 MG tablet Take 800 mg by mouth 2 (two) times daily as needed.    Ascorbic Acid (VITAMIN C) 500 MG CAPS Take 500 mg by mouth daily.   BIOTIN 5000 PO Take 10,000 mcg by mouth daily.    budesonide (PULMICORT) 1 MG/2ML nebulizer solution ADD 1ML OF MEDICATION TO 240ML OF SALINE IN SALINE IRRIGATION BOTTLE; IRRIGATE SINUSES WITH 120ML THROUGH EACH NOSTRIL TWICE DAILY   BUDESONIDE PO 2 (two) times daily.   busPIRone (BUSPAR) 5 MG tablet Take 5 mg by mouth daily as needed.   Cholecalciferol (VITAMIN D PO) Take 10,000 Units by mouth daily.    Coenzyme Q10 (COQ-10 PO) Take 120 mg by mouth daily.   COLLAGEN PO Take 11 mg by mouth daily.   Cyanocobalamin (B-12 PO) Take 5,000 mcg by mouth daily.   diclofenac sodium (VOLTAREN) 1 % GEL Apply 1 application topically as needed.   ELDERBERRY PO Take 125 mg by mouth daily.    fluocinonide cream (LIDEX) 9.47 % Apply 1 application topically 2 (two) times daily as needed.    fluticasone (FLONASE) 50 MCG/ACT nasal spray Place 2 sprays into both nostrils daily.   folic acid (FOLVITE) 654 MCG tablet Take 800 mcg by mouth 2 (two) times daily.   Hypertonic Nasal Wash (SINUS RINSE NA) Place into the nose as needed.   LEVOFLOXACIN PO 2 (two) times daily. levofloxacin 171m/ml cash  ADD 1ML OF MEDICATION TO 240ML OF SALINE IN SALINE IRRIGATION BOTTLE; IRRIGATE   levothyroxine (SYNTHROID) 50 MCG tablet TAKE 1 TABLET EVERY OTHER  DAY   levothyroxine (SYNTHROID) 75 MCG tablet TAKE 1 TABLET EVERY OTHER  DAY   Omega-3 Fatty Acids (FISH OIL PO) Take 1,200 mg by mouth daily.   omeprazole (PRILOSEC) 40 MG capsule Take 40 mg by mouth daily.   Probiotic Product (PROBIOTIC DAILY PO) Take 1 capsule by mouth.   rosuvastatin (CRESTOR) 5 MG  tablet TAKE 1 TABLET DAILY   vitamin E 400 UNIT capsule Take 400 Units by mouth daily.   No facility-administered encounter medications on file as of 01/16/2020.    Review of Systems  Constitutional: Negative for appetite change and unexpected weight change.  HENT: Negative for congestion and sinus pressure.  Respiratory: Negative for cough, chest tightness and shortness of breath.   Cardiovascular: Negative for chest pain, palpitations and leg swelling.  Gastrointestinal: Negative for abdominal pain, nausea and vomiting.       Loose stool as outlined.    Genitourinary: Negative for difficulty urinating and dysuria.  Musculoskeletal: Negative for myalgias.       Some stiffness/ joint pain - hands/feet.   Skin: Negative for color change and rash.  Neurological: Negative for dizziness, light-headedness and headaches.  Psychiatric/Behavioral: Negative for agitation and dysphoric mood.       Objective:    Physical Exam Vitals reviewed.  Constitutional:      General: She is not in acute distress.    Appearance: Normal appearance.  HENT:     Head: Normocephalic and atraumatic.     Right Ear: External ear normal.     Left Ear: External ear normal.  Eyes:     General: No scleral icterus.       Right eye: No discharge.        Left eye: No discharge.     Conjunctiva/sclera: Conjunctivae normal.  Neck:     Thyroid: No thyromegaly.  Cardiovascular:     Rate and Rhythm: Normal rate and regular rhythm.  Pulmonary:     Effort: No respiratory distress.     Breath sounds: Normal breath sounds. No wheezing.  Abdominal:     General: Bowel sounds are normal.     Palpations: Abdomen is soft.     Tenderness: There is no abdominal tenderness.  Musculoskeletal:        General: No swelling or tenderness.     Cervical back: Neck supple. No tenderness.  Lymphadenopathy:     Cervical: No cervical adenopathy.  Skin:    Findings: No erythema or rash.  Neurological:     Mental Status: She  is alert.  Psychiatric:        Mood and Affect: Mood normal.        Behavior: Behavior normal.     BP 120/70    Pulse 62    Temp 98.2 F (36.8 C)    Resp 16    Ht _0  (1.626 m)    Wt 144 lb (65.3 kg)    SpO2 98%    BMI 24.72 kg/m  Wt Readings from Last 3 Encounters:  01/16/20 144 lb (65.3 kg)  09/23/19 144 lb 9.6 oz (65.6 kg)  05/21/19 144 lb (65.3 kg)     Lab Results  Component Value Date   WBC 5.5 09/23/2019   HGB 13.0 09/23/2019   HCT 38.1 09/23/2019   PLT 252.0 09/23/2019   GLUCOSE 93 09/19/2019   CHOL 199 09/19/2019   TRIG 114.0 09/19/2019   HDL 55.00 09/19/2019   LDLDIRECT 123.4 02/27/2013   LDLCALC 121 (H) 09/19/2019   ALT 13 09/19/2019   AST 19 09/19/2019   NA 140 09/19/2019   K 4.0 09/19/2019   CL 103 09/19/2019   CREATININE 0.71 09/19/2019   BUN 17 09/19/2019   CO2 28 09/19/2019   TSH 0.72 09/23/2019   HGBA1C 5.5 09/19/2019    DG Chest 2 View  Result Date: 05/22/2019 CLINICAL DATA:  COVID-19 positive.  Chest tightness EXAM: CHEST - 2 VIEW COMPARISON:  None. FINDINGS: Pulmonary hyperinflation. No acute infiltrate or effusion. Negative for heart failure. Mild atherosclerotic calcification aortic arch. IMPRESSION: No active cardiopulmonary disease. Electronically Signed   By: Franchot Gallo M.D.   On: 05/22/2019 17:12  Assessment & Plan:   Problem List Items Addressed This Visit    Change in bowel habits - Primary    Loose stool as outlined.  Hold supplement. See if this improves.  Follow.  Call with update.       GERD (gastroesophageal reflux disease)    On omeprazole.  Followed by GI.  S/p EGD 11/2019.       History of colonic polyps    Had f/u colonoscopy 2 years ago.  Ok.  Follow.       Hot flashes    Saw endocrinology.  Started on estroven.  Hot flashes may be some better.  Follow.  Discussed possible trial of effexor.        Hypercholesteremia    On crestor.  Low cholesterol diet and exercise.  Follow lipid panel and liver function  tests.        Relevant Orders   Hepatic function panel   Lipid panel   Hypothyroidism    On thyroid replacement.  Follow tsh.       Loose stools    Intermittent loose stools as outlined.  Question if related to the supplement.  Will stop supplement.  See if symptoms improve.  Call with update.        Stress    Overall appears to be handling things well.  Follow.         Other Visit Diagnoses    Hyperglycemia       Relevant Orders   Hemoglobin S0S   Basic metabolic panel   Encounter for screening mammogram for malignant neoplasm of breast       Relevant Orders   MM 3D SCREEN BREAST BILATERAL       Einar Pheasant, MD

## 2020-01-18 ENCOUNTER — Encounter: Payer: Self-pay | Admitting: Internal Medicine

## 2020-01-18 NOTE — Assessment & Plan Note (Signed)
Had f/u colonoscopy 2 years ago.  Ok.  Follow.

## 2020-01-18 NOTE — Assessment & Plan Note (Signed)
Saw endocrinology.  Started on estroven.  Hot flashes may be some better.  Follow.  Discussed possible trial of effexor.

## 2020-01-18 NOTE — Assessment & Plan Note (Signed)
Intermittent loose stools as outlined.  Question if related to the supplement.  Will stop supplement.  See if symptoms improve.  Call with update.

## 2020-01-18 NOTE — Assessment & Plan Note (Signed)
On thyroid replacement.  Follow tsh.  

## 2020-01-18 NOTE — Assessment & Plan Note (Signed)
On omeprazole.  Followed by GI.  S/p EGD 11/2019.

## 2020-01-18 NOTE — Assessment & Plan Note (Signed)
Overall appears to be handling things well.  Follow.  ?

## 2020-01-18 NOTE — Assessment & Plan Note (Signed)
On crestor.  Low cholesterol diet and exercise.  Follow lipid panel and liver function tests.   

## 2020-01-18 NOTE — Assessment & Plan Note (Signed)
Loose stool as outlined.  Hold supplement. See if this improves.  Follow.  Call with update.

## 2020-01-22 DIAGNOSIS — M9903 Segmental and somatic dysfunction of lumbar region: Secondary | ICD-10-CM | POA: Diagnosis not present

## 2020-01-22 DIAGNOSIS — M9901 Segmental and somatic dysfunction of cervical region: Secondary | ICD-10-CM | POA: Diagnosis not present

## 2020-01-22 DIAGNOSIS — M5033 Other cervical disc degeneration, cervicothoracic region: Secondary | ICD-10-CM | POA: Diagnosis not present

## 2020-01-22 DIAGNOSIS — M5412 Radiculopathy, cervical region: Secondary | ICD-10-CM | POA: Diagnosis not present

## 2020-01-29 DIAGNOSIS — M9901 Segmental and somatic dysfunction of cervical region: Secondary | ICD-10-CM | POA: Diagnosis not present

## 2020-01-29 DIAGNOSIS — M9903 Segmental and somatic dysfunction of lumbar region: Secondary | ICD-10-CM | POA: Diagnosis not present

## 2020-01-29 DIAGNOSIS — M5033 Other cervical disc degeneration, cervicothoracic region: Secondary | ICD-10-CM | POA: Diagnosis not present

## 2020-01-29 DIAGNOSIS — M5412 Radiculopathy, cervical region: Secondary | ICD-10-CM | POA: Diagnosis not present

## 2020-02-05 DIAGNOSIS — M5033 Other cervical disc degeneration, cervicothoracic region: Secondary | ICD-10-CM | POA: Diagnosis not present

## 2020-02-05 DIAGNOSIS — M9903 Segmental and somatic dysfunction of lumbar region: Secondary | ICD-10-CM | POA: Diagnosis not present

## 2020-02-05 DIAGNOSIS — M9901 Segmental and somatic dysfunction of cervical region: Secondary | ICD-10-CM | POA: Diagnosis not present

## 2020-02-05 DIAGNOSIS — M5412 Radiculopathy, cervical region: Secondary | ICD-10-CM | POA: Diagnosis not present

## 2020-02-06 ENCOUNTER — Encounter: Payer: Self-pay | Admitting: Internal Medicine

## 2020-02-06 ENCOUNTER — Other Ambulatory Visit (INDEPENDENT_AMBULATORY_CARE_PROVIDER_SITE_OTHER): Payer: Medicare HMO

## 2020-02-06 ENCOUNTER — Other Ambulatory Visit: Payer: Self-pay

## 2020-02-06 DIAGNOSIS — E7800 Pure hypercholesterolemia, unspecified: Secondary | ICD-10-CM

## 2020-02-06 DIAGNOSIS — R739 Hyperglycemia, unspecified: Secondary | ICD-10-CM | POA: Diagnosis not present

## 2020-02-06 LAB — HEPATIC FUNCTION PANEL
ALT: 14 U/L (ref 0–35)
AST: 18 U/L (ref 0–37)
Albumin: 4.6 g/dL (ref 3.5–5.2)
Alkaline Phosphatase: 55 U/L (ref 39–117)
Bilirubin, Direct: 0.1 mg/dL (ref 0.0–0.3)
Total Bilirubin: 0.6 mg/dL (ref 0.2–1.2)
Total Protein: 7.3 g/dL (ref 6.0–8.3)

## 2020-02-06 LAB — BASIC METABOLIC PANEL WITH GFR
BUN: 15 mg/dL (ref 6–23)
CO2: 30 meq/L (ref 19–32)
Calcium: 10 mg/dL (ref 8.4–10.5)
Chloride: 103 meq/L (ref 96–112)
Creatinine, Ser: 0.76 mg/dL (ref 0.40–1.20)
GFR: 74.21 mL/min
Glucose, Bld: 89 mg/dL (ref 70–99)
Potassium: 4.2 meq/L (ref 3.5–5.1)
Sodium: 139 meq/L (ref 135–145)

## 2020-02-06 LAB — LIPID PANEL
Cholesterol: 195 mg/dL (ref 0–200)
HDL: 57.7 mg/dL
LDL Cholesterol: 115 mg/dL — ABNORMAL HIGH (ref 0–99)
NonHDL: 137.52
Total CHOL/HDL Ratio: 3
Triglycerides: 115 mg/dL (ref 0.0–149.0)
VLDL: 23 mg/dL (ref 0.0–40.0)

## 2020-02-06 LAB — HEMOGLOBIN A1C: Hgb A1c MFr Bld: 5.6 % (ref 4.6–6.5)

## 2020-02-06 NOTE — Telephone Encounter (Signed)
There are prescription options for her hot flashes.  If she desires to discuss and try something more, can schedule appt to discuss.

## 2020-02-08 ENCOUNTER — Other Ambulatory Visit: Payer: Self-pay | Admitting: Internal Medicine

## 2020-02-08 MED ORDER — ROSUVASTATIN CALCIUM 10 MG PO TABS: 10.0000 mg | ORAL_TABLET | Freq: Every day | ORAL | 1 refills | Status: DC

## 2020-02-08 NOTE — Progress Notes (Signed)
rx sent in for crestor 10mg  q day #90 with one refill.  See lab result note.

## 2020-02-12 DIAGNOSIS — M5412 Radiculopathy, cervical region: Secondary | ICD-10-CM | POA: Diagnosis not present

## 2020-02-12 DIAGNOSIS — M5033 Other cervical disc degeneration, cervicothoracic region: Secondary | ICD-10-CM | POA: Diagnosis not present

## 2020-02-12 DIAGNOSIS — M9901 Segmental and somatic dysfunction of cervical region: Secondary | ICD-10-CM | POA: Diagnosis not present

## 2020-02-12 DIAGNOSIS — M9903 Segmental and somatic dysfunction of lumbar region: Secondary | ICD-10-CM | POA: Diagnosis not present

## 2020-02-20 ENCOUNTER — Ambulatory Visit (INDEPENDENT_AMBULATORY_CARE_PROVIDER_SITE_OTHER): Payer: Medicare HMO

## 2020-02-20 VITALS — Ht 63.5 in | Wt 141.5 lb

## 2020-02-20 DIAGNOSIS — Z0000 Encounter for general adult medical examination without abnormal findings: Secondary | ICD-10-CM | POA: Diagnosis not present

## 2020-02-20 NOTE — Patient Instructions (Addendum)
[name] , Thank you for taking time to come for your Medicare Wellness Visit. I appreciate your ongoing commitment to your health goals. Please review the following plan we discussed and let me know if I can assist you in the future.   These are the goals we discussed: Goals    . Follow up with Provider as scheduled       This is a list of the screening recommended for you and due dates:  Health Maintenance  Topic Date Due  . Flu Shot  01/12/2020  . Mammogram  03/17/2020  . Colon Cancer Screening  01/16/2023  . Tetanus Vaccine  11/16/2025  . DEXA scan (bone density measurement)  Completed  . COVID-19 Vaccine  Completed  .  Hepatitis C: One time screening is recommended by Center for Disease Control  (CDC) for  adults born from [birth year] through 1965.   Completed  . Pneumonia vaccines  Completed    Immunizations Immunization History  Administered Date(s) Administered  . Fluad Quad(high Dose 65+) 02/19/2019  . Influenza Split 03/26/2012  . Influenza, High Dose Seasonal PF 03/07/2017, 03/09/2018  . Influenza,inj,Quad PF,6+ Mos 02/25/2013, 02/14/2014, 03/26/2015, 02/02/2016  . Influenza,inj,quad, With Preservative 03/13/2017, 03/13/2018  . PFIZER SARS-COV-2 Vaccination 10/18/2019, 11/12/2019  . Pneumococcal Conjugate-13 04/22/2015  . Pneumococcal Polysaccharide-23 01/24/2011, 06/14/2015  . Tdap 11/17/2015   Advanced directives: End of life planning; Advance aging; Advanced directives discussed.  Copy of current HCPOA/Living Will requested.    Conditions/risks identified: none new  Follow up in one year for your annual wellness visit.   Preventive Care 75 Years and Older, Female Preventive care refers to lifestyle choices and visits with your health care provider that can promote health and wellness. What does preventive care include?  A yearly physical exam. This is also called an annual well check.  Dental exams once or twice a year.  Routine eye exams. Ask your health  care provider how often you should have your eyes checked.  Personal lifestyle choices, including:  Daily care of your teeth and gums.  Regular physical activity.  Eating a healthy diet.  Avoiding tobacco and drug use.  Limiting alcohol use.  Practicing safe sex.  Taking low-dose aspirin every day.  Taking vitamin and mineral supplements as recommended by your health care provider. What happens during an annual well check? The services and screenings done by your health care provider during your annual well check will depend on your age, overall health, lifestyle risk factors, and family history of disease. Counseling  Your health care provider may ask you questions about your:  Alcohol use.  Tobacco use.  Drug use.  Emotional well-being.  Home and relationship well-being.  Sexual activity.  Eating habits.  History of falls.  Memory and ability to understand (cognition).  Work and work Statistician.  Reproductive health. Screening  You may have the following tests or measurements:  Height, weight, and BMI.  Blood pressure.  Lipid and cholesterol levels. These may be checked every 5 years, or more frequently if you are over 75 years old.  Skin check.  Lung cancer screening. You may have this screening every year starting at age 75 if you have a 30-pack-year history of smoking and currently smoke or have quit within the past 15 years.  Fecal occult blood test (FOBT) of the stool. You may have this test every year starting at age 75.  Flexible sigmoidoscopy or colonoscopy. You may have a sigmoidoscopy every 5 years or a colonoscopy every  10 years starting at age 22.  Hepatitis C blood test.  Hepatitis B blood test.  Sexually transmitted disease (STD) testing.  Diabetes screening. This is done by checking your blood sugar (glucose) after you have not eaten for a while (fasting). You may have this done every 1-3 years.  Bone density scan. This is done  to screen for osteoporosis. You may have this done starting at age 16.  Mammogram. This may be done every 1-2 years. Talk to your health care provider about how often you should have regular mammograms. Talk with your health care provider about your test results, treatment options, and if necessary, the need for more tests. Vaccines  Your health care provider may recommend certain vaccines, such as:  Influenza vaccine. This is recommended every year.  Tetanus, diphtheria, and acellular pertussis (Tdap, Td) vaccine. You may need a Td booster every 10 years.  Zoster vaccine. You may need this after age 21.  Pneumococcal 13-valent conjugate (PCV13) vaccine. One dose is recommended after age 58.  Pneumococcal polysaccharide (PPSV23) vaccine. One dose is recommended after age 28. Talk to your health care provider about which screenings and vaccines you need and how often you need them. This information is not intended to replace advice given to you by your health care provider. Make sure you discuss any questions you have with your health care provider. Document Released: 06/26/2015 Document Revised: 02/17/2016 Document Reviewed: 03/31/2015 Elsevier Interactive Patient Education  2017 [place] Prevention in the Home Falls can cause injuries. They can happen to people of all ages. There are many things you can do to make your home safe and to help prevent falls. What can I do on the outside of my home?  Regularly fix the edges of walkways and driveways and fix any cracks.  Remove anything that might make you trip as you walk through a door, such as a raised step or threshold.  Trim any bushes or trees on the path to your home.  Use bright outdoor lighting.  Clear any walking paths of anything that might make someone trip, such as rocks or tools.  Regularly check to see if handrails are loose or broken. Make sure that both sides of any steps have handrails.  Any raised decks  and porches should have guardrails on the edges.  Have any leaves, snow, or ice cleared regularly.  Use sand or salt on walking paths during winter.  Clean up any spills in your garage right away. This includes oil or grease spills. What can I do in the bathroom?  Use night lights.  Install grab bars by the toilet and in the tub and shower. Do not use towel bars as grab bars.  Use non-skid mats or decals in the tub or shower.  If you need to sit down in the shower, use a plastic, non-slip stool.  Keep the floor dry. Clean up any water that spills on the floor as soon as it happens.  Remove soap buildup in the tub or shower regularly.  Attach bath mats securely with double-sided non-slip rug tape.  Do not have throw rugs and other things on the floor that can make you trip. What can I do in the bedroom?  Use night lights.  Make sure that you have a light by your bed that is easy to reach.  Do not use any sheets or blankets that are too big for your bed. They should not hang down onto the floor.  Have a firm chair that has side arms. You can use this for support while you get dressed.  Do not have throw rugs and other things on the floor that can make you trip. What can I do in the kitchen?  Clean up any spills right away.  Avoid walking on wet floors.  Keep items that you use a lot in easy-to-reach places.  If you need to reach something above you, use a strong step stool that has a grab bar.  Keep electrical cords out of the way.  Do not use floor polish or wax that makes floors slippery. If you must use wax, use non-skid floor wax.  Do not have throw rugs and other things on the floor that can make you trip. What can I do with my stairs?  Do not leave any items on the stairs.  Make sure that there are handrails on both sides of the stairs and use them. Fix handrails that are broken or loose. Make sure that handrails are as long as the stairways.  Check any  carpeting to make sure that it is firmly attached to the stairs. Fix any carpet that is loose or worn.  Avoid having throw rugs at the top or bottom of the stairs. If you do have throw rugs, attach them to the floor with carpet tape.  Make sure that you have a light switch at the top of the stairs and the bottom of the stairs. If you do not have them, ask someone to add them for you. What else can I do to help prevent falls?  Wear shoes that:  Do not have high heels.  Have rubber bottoms.  Are comfortable and fit you well.  Are closed at the toe. Do not wear sandals.  If you use a stepladder:  Make sure that it is fully opened. Do not climb a closed stepladder.  Make sure that both sides of the stepladder are locked into place.  Ask someone to hold it for you, if possible.  Clearly mark and make sure that you can see:  Any grab bars or handrails.  First and last steps.  Where the edge of each step is.  Use tools that help you move around (mobility aids) if they are needed. These include:  Canes.  Walkers.  Scooters.  Crutches.  Turn on the lights when you go into a dark area. Replace any light bulbs as soon as they burn out.  Set up your furniture so you have a clear path. Avoid moving your furniture around.  If any of your floors are uneven, fix them.  If there are any pets around you, be aware of where they are.  Review your medicines with your doctor. Some medicines can make you feel dizzy. This can increase your chance of falling. Ask your doctor what other things that you can do to help prevent falls. This information is not intended to replace advice given to you by your health care provider. Make sure you discuss any questions you have with your health care provider. Document Released: 03/26/2009 Document Revised: 11/05/2015 Document Reviewed: 07/04/2014 Elsevier Interactive Patient Education  2017 [employer].

## 2020-02-20 NOTE — Progress Notes (Addendum)
Subjective:   [name] is a 74 y.o. female who presents for Medicare Annual (Subsequent) preventive examination.  Review of Systems    No ROS.  Medicare Wellness Virtual Visit.    Cardiac Risk Factors include: advanced age (>58mn, >>56women)     Objective:    Today's Vitals   02/20/20 1130  Weight: 141 lb 8 oz (64.2 kg)  Height: 5' 3.5" (1.613 m)   Body mass index is 24.67 kg/m.  Advanced Directives 02/20/2020 02/19/2019 09/18/2017 08/02/2016 05/27/2015  Does Patient Have a Medical Advance Directive? Yes Yes Yes Yes Yes  Type of AParamedicof AMiddle RiverLiving will Living will;Healthcare Power of [place] Living will;Healthcare Power of [place]  Does patient want to make changes to medical advance directive? No - Patient declined No - Patient declined No - Patient declined No - Patient declined No - Patient declined  Copy of [place] Chart? No - copy requested Yes - validated most recent copy scanned in chart (See row information) No - copy requested No - copy requested No - copy requested    Current Medications (verified) Outpatient Encounter Medications as of 02/20/2020  Medication Sig  . acetaminophen (TYLENOL) 500 MG tablet Take 500 mg by mouth every 6 (six) hours as needed.  .Marland Kitchenacyclovir (ZOVIRAX) 800 MG tablet Take 800 mg by mouth 2 (two) times daily as needed.   . Ascorbic Acid (VITAMIN C) 500 MG CAPS Take 500 mg by mouth daily.  .Marland KitchenBIOTIN 5000 PO Take 10,000 mcg by mouth daily.   . budesonide (PULMICORT) 1 MG/2ML nebulizer solution ADD 1ML OF MEDICATION TO 240ML OF SALINE IN SALINE IRRIGATION BOTTLE; IRRIGATE SINUSES WITH 120ML THROUGH EACH NOSTRIL TWICE DAILY  . BUDESONIDE PO 2 (two) times daily.  . busPIRone (BUSPAR) 5 MG tablet Take 5 mg by mouth daily as needed.  . Cholecalciferol (VITAMIN D PO) Take 10,000 Units by mouth daily.   .  Coenzyme Q10 (COQ-10 PO) Take 120 mg by mouth daily.  . COLLAGEN PO Take 11 mg by mouth daily.  . Cyanocobalamin (B-12 PO) Take 5,000 mcg by mouth daily.  . diclofenac sodium (VOLTAREN) 1 % GEL Apply 1 application topically as needed.  .Marland KitchenELDERBERRY PO Take 125 mg by mouth daily.   . fluocinonide cream (LIDEX) 01.89% Apply 1 application topically 2 (two) times daily as needed.   . fluticasone (FLONASE) 50 MCG/ACT nasal spray Place 2 sprays into both nostrils daily.  . folic acid (FOLVITE) 8842MCG tablet Take 800 mcg by mouth 2 (two) times daily.  . Hypertonic Nasal Wash (SINUS RINSE NA) Place into the nose as needed.  .Marland KitchenLEVOFLOXACIN PO 2 (two) times daily. levofloxacin 1083mml cash  ADD 1ML OF MEDICATION TO 240ML OF SALINE IN SALINE IRRIGATION BOTTLE; IRRIGATE  . levothyroxine (SYNTHROID) 50 MCG tablet TAKE 1 TABLET EVERY OTHER  DAY  . levothyroxine (SYNTHROID) 75 MCG tablet TAKE 1 TABLET EVERY OTHER  DAY  . Omega-3 Fatty Acids (FISH OIL PO) Take 1,200 mg by mouth daily.  . Marland Kitchenmeprazole (PRILOSEC) 40 MG capsule Take 40 mg by mouth daily.  . Probiotic Product (PROBIOTIC DAILY PO) Take 1 capsule by mouth.  . rosuvastatin (CRESTOR) 10 MG tablet Take 1 tablet (10 mg total) by mouth daily.  . vitamin E 400 UNIT capsule Take 400 Units by mouth daily.   No facility-administered encounter medications on file as of 02/20/2020.  Allergies (verified) Diclofenac, Ibuprofen, Tramadol, Hydromorphone, and Oxycodone   History: Past Medical History:  Diagnosis Date  . Arthritis   . Environmental allergies   . Esophagitis    Barrets, followed by Dr Vennie Homans  . GERD (gastroesophageal reflux disease)   . Hypercholesterolemia   . Hypothyroidism    Past Surgical History:  Procedure Laterality Date  . [place]  . KNEE ARTHROSCOPY  12/03/07   left  . KNEE ARTHROSCOPY  06/25/08   left  . NASAL SINUS SURGERY  12/06  . REPLACEMENT TOTAL KNEE  01/04/09   left  . RHINOPLASTY  1986  . VAGINAL  HYSTERECTOMY  1980   ovaries left in place  . WRIST SURGERY  1970   cyst removal   Family History  Problem Relation Age of Onset  . Heart disease Father        heart attack  . Diabetes Father   . Hyperlipidemia Paternal Grandmother   . Diabetes Paternal Grandmother   . Stroke Paternal Grandmother   . Hyperlipidemia Paternal Grandfather   . Diabetes Paternal Grandfather   . Prostate cancer Maternal Uncle   . Pancreatic cancer Cousin   . Multiple myeloma Brother   . Breast cancer Maternal Aunt   . Breast cancer Maternal Aunt    Social History   Socioeconomic History  . Marital status: Married    Spouse name: Not on file  . Number of children: Not on file  . Years of education: Not on file  . Highest education level: Not on file  Occupational History  . Not on file  Tobacco Use  . Smoking status: Former Smoker    Years: 5.00    Types: Cigarettes  . Smokeless tobacco: Never Used  Vaping Use  . Vaping Use: Never used  Substance and Sexual Activity  . Alcohol use: Yes    Alcohol/week: 0.0 standard drinks    Comment: rare  . Drug use: No  . Sexual activity: Yes  Other Topics Concern  . Not on file  Social History Narrative  . Not on file   Social Determinants of Health   Financial Resource Strain: Low Risk   . Difficulty of Paying Living Expenses: Not hard at all  Food Insecurity: No Food Insecurity  . Worried About Charity fundraiser in the Last Year: Never true  . Ran Out of Food in the Last Year: Never true  Transportation Needs: No Transportation Needs  . Lack of Transportation (Medical): No  . Lack of Transportation (Non-Medical): No  Physical Activity: Insufficiently Active  . Days of Exercise per Week: 2 days  . Minutes of Exercise per Session: 60 min  Stress: No Stress Concern Present  . Feeling of Stress : Not at all  Social Connections: Unknown  . Frequency of Communication with Friends and Family: More than three times a week  . Frequency of  Social Gatherings with Friends and Family: Not on file  . Attends Religious Services: Not on file  . Active Member of Clubs or Organizations: Not on file  . Attends Archivist Meetings: Not on file  . Marital Status: Married    Tobacco Counseling Counseling given: Not Answered   Clinical Intake:  Pre-visit preparation completed: Yes        Diabetes: No  How often do you need to have someone help you when you read instructions, pamphlets, or other written materials from your doctor or pharmacy?: 1 - Never Interpreter Needed?: No  Activities of Daily Living In your present state of health, do you have any difficulty performing the following activities: 02/20/2020  Hearing? N  Vision? N  Difficulty concentrating or making decisions? N  Walking or climbing stairs? N  Dressing or bathing? N  Doing errands, shopping? N  Preparing Food and eating ? N  Using the Toilet? N  In the past six months, have you accidently leaked urine? N  Do you have problems with loss of bowel control? N  Managing your Medications? N  Managing your Finances? N  Housekeeping or managing your Housekeeping? N  Some recent data might be hidden    Patient Care Team: Einar Pheasant, MD as PCP - General (Internal Medicine)  Indicate any recent Medical Services you may have received from other than [facility] providers in the past year (date may be approximate).     Assessment:   This is a routine wellness examination for [name].  I connected with [name] today by telephone and verified that I am speaking with the correct person using two identifiers. Location patient: home Location provider: work Persons participating in the virtual visit: patient, Marine scientist.    I discussed the limitations, risks, security and privacy concerns of performing an evaluation and management service by telephone and the availability of in person appointments. The patient expressed understanding and verbally consented  to this telephonic visit.    Interactive audio and video telecommunications were attempted between this provider and patient, however failed, due to patient having technical difficulties OR patient did not have access to video capability.  We continued and completed visit with audio only.  Some vital signs may be absent or patient reported.   Hearing/Vision screen  Hearing Screening   125Hz  250Hz  500Hz  1000Hz  2000Hz  3000Hz  4000Hz  6000Hz  8000Hz   Right ear:           Left ear:           Comments: Patient is able to hear conversational tones without difficulty.  No issues reported.   Vision Screening Comments: Wears corrective lenses Visual acuity not assessed, virtual visit.  They have seen their ophthalmologist in the last 12 months.     Dietary issues and exercise activities discussed: Current Exercise Habits: Home exercise routine, Type of exercise: walking;yoga;strength training/weights;stretching, Time (Minutes): 60, Frequency (Times/Week): 2, Weekly Exercise (Minutes/Week): 120, Intensity: Mild  Regular diet Good water intake   Goals    . Follow up with Provider as scheduled      Depression Screen PHQ 2/9 Scores 02/20/2020 02/19/2019 09/18/2017 06/19/2017 12/16/2016 08/02/2016 03/23/2016  PHQ - 2 Score 0 0 0 0 0 0 0    Fall Risk Fall Risk  02/20/2020 02/19/2019 09/18/2017 06/19/2017 12/16/2016  Falls in the past year? 0 0 No No No  Number falls in past yr: 0 - - - -  Follow up Falls evaluation completed - - - -   Handrails in use when climbing stairs? Yes Home free of loose throw rugs in walkways, pet beds, electrical cords, etc? Yes  Adequate lighting in your home to reduce risk of falls? Yes   ASSISTIVE DEVICES UTILIZED TO PREVENT FALLS:  Life alert? No  Use of a cane, walker or w/c? No    TIMED UP AND GO:  Was the test performed? No . Virtual visit.   Cognitive Function: MMSE - Mini Mental State Exam 09/18/2017 08/02/2016 05/27/2015  Orientation to time 5 5 5   Orientation to  Place 5 5 5   Registration 3 3 3  Attention/ Calculation 5 5 5   Recall 3 3 3   Language- name 2 objects 2 2 2   Language- repeat 1 1 1   Language- follow 3 step command 3 3 3   Language- read & follow direction 1 1 1   Write a sentence 1 1 1   Copy design 1 1 1   Total score 30 30 30      6CIT Screen 02/19/2019  What Year? 0 points  What month? 0 points  What time? 0 points  Count back from 20 0 points  Months in reverse 0 points  Repeat phrase 0 points  Total Score 0    Immunizations Immunization History  Administered Date(s) Administered  . Fluad Quad(high Dose 65+) 02/19/2019  . Influenza Split 03/26/2012  . Influenza, High Dose Seasonal PF 03/07/2017, 03/09/2018  . Influenza,inj,Quad PF,6+ Mos 02/25/2013, 02/14/2014, 03/26/2015, 02/02/2016  . Influenza,inj,quad, With Preservative 03/13/2017, 03/13/2018  . PFIZER SARS-COV-2 Vaccination 10/18/2019, 11/12/2019  . Pneumococcal Conjugate-13 04/22/2015  . Pneumococcal Polysaccharide-23 01/24/2011, 06/14/2015  . Tdap 11/17/2015    Health Maintenance Health Maintenance  Topic Date Due  . INFLUENZA VACCINE  01/12/2020  . MAMMOGRAM  03/17/2020  . COLONOSCOPY  01/16/2023  . TETANUS/TDAP  11/16/2025  . DEXA SCAN  Completed  . COVID-19 Vaccine  Completed  . Hepatitis C Screening  Completed  . PNA vac Low Risk Adult  Completed    Dental Screening: Recommended annual dental exams for proper oral hygiene.  Community Resource Referral / Chronic Care Management: CRR required this visit?  No   CCM required this visit?  No      Plan:   Keep all routine maintenance appointments.   Follow up 04/07/20 @ 11:00.  I have personally reviewed and noted the following in the patient's chart:   . Medical and social history . Use of alcohol, tobacco or illicit drugs  . Current medications and supplements . Functional ability and status . Nutritional status . Physical activity . Advanced directives . List of other  physicians . Hospitalizations, surgeries, and ER visits in previous 12 months . Vitals . Screenings to include cognitive, depression, and falls . Referrals and appointments  In addition, I have reviewed and discussed with patient certain preventive protocols, quality metrics, and best practice recommendations. A written personalized care plan for preventive services as well as general preventive health recommendations were provided to patient via mychart.     Varney Biles, LPN   06/18/3844     Reviewed above information.  Agree with assessment and plan.    Dr Nicki Reaper

## 2020-02-25 DIAGNOSIS — M5412 Radiculopathy, cervical region: Secondary | ICD-10-CM | POA: Diagnosis not present

## 2020-02-25 DIAGNOSIS — M9901 Segmental and somatic dysfunction of cervical region: Secondary | ICD-10-CM | POA: Diagnosis not present

## 2020-02-25 DIAGNOSIS — M9903 Segmental and somatic dysfunction of lumbar region: Secondary | ICD-10-CM | POA: Diagnosis not present

## 2020-02-25 DIAGNOSIS — M5033 Other cervical disc degeneration, cervicothoracic region: Secondary | ICD-10-CM | POA: Diagnosis not present

## 2020-03-10 DIAGNOSIS — M5412 Radiculopathy, cervical region: Secondary | ICD-10-CM | POA: Diagnosis not present

## 2020-03-10 DIAGNOSIS — M9901 Segmental and somatic dysfunction of cervical region: Secondary | ICD-10-CM | POA: Diagnosis not present

## 2020-03-10 DIAGNOSIS — M5033 Other cervical disc degeneration, cervicothoracic region: Secondary | ICD-10-CM | POA: Diagnosis not present

## 2020-03-10 DIAGNOSIS — M9903 Segmental and somatic dysfunction of lumbar region: Secondary | ICD-10-CM | POA: Diagnosis not present

## 2020-03-24 DIAGNOSIS — M5033 Other cervical disc degeneration, cervicothoracic region: Secondary | ICD-10-CM | POA: Diagnosis not present

## 2020-03-24 DIAGNOSIS — M9903 Segmental and somatic dysfunction of lumbar region: Secondary | ICD-10-CM | POA: Diagnosis not present

## 2020-03-24 DIAGNOSIS — M5412 Radiculopathy, cervical region: Secondary | ICD-10-CM | POA: Diagnosis not present

## 2020-03-24 DIAGNOSIS — M9901 Segmental and somatic dysfunction of cervical region: Secondary | ICD-10-CM | POA: Diagnosis not present

## 2020-03-30 ENCOUNTER — Ambulatory Visit
Admission: RE | Admit: 2020-03-30 | Discharge: 2020-03-30 | Disposition: A | Discharge status: Home / Self Care | Payer: Medicare HMO | Source: Ambulatory Visit | Attending: Internal Medicine | Admitting: Internal Medicine

## 2020-03-30 ENCOUNTER — Other Ambulatory Visit: Payer: Self-pay

## 2020-03-30 DIAGNOSIS — Z1231 Encounter for screening mammogram for malignant neoplasm of breast: Secondary | ICD-10-CM | POA: Diagnosis present

## 2020-04-07 ENCOUNTER — Ambulatory Visit (INDEPENDENT_AMBULATORY_CARE_PROVIDER_SITE_OTHER): Payer: Medicare HMO | Admitting: Internal Medicine

## 2020-04-07 ENCOUNTER — Encounter: Payer: Self-pay | Admitting: Internal Medicine

## 2020-04-07 ENCOUNTER — Other Ambulatory Visit: Payer: Self-pay

## 2020-04-07 VITALS — BP 130/84 | HR 58 | Temp 97.7°F | Ht 62.6 in | Wt 142.4 lb

## 2020-04-07 DIAGNOSIS — Z0000 Encounter for general adult medical examination without abnormal findings: Secondary | ICD-10-CM

## 2020-04-07 DIAGNOSIS — E039 Hypothyroidism, unspecified: Secondary | ICD-10-CM | POA: Diagnosis not present

## 2020-04-07 DIAGNOSIS — R232 Flushing: Secondary | ICD-10-CM

## 2020-04-07 DIAGNOSIS — K2100 Gastro-esophageal reflux disease with esophagitis, without bleeding: Secondary | ICD-10-CM

## 2020-04-07 DIAGNOSIS — E7800 Pure hypercholesterolemia, unspecified: Secondary | ICD-10-CM | POA: Diagnosis not present

## 2020-04-07 DIAGNOSIS — Z23 Encounter for immunization: Secondary | ICD-10-CM

## 2020-04-07 DIAGNOSIS — R739 Hyperglycemia, unspecified: Secondary | ICD-10-CM | POA: Diagnosis not present

## 2020-04-07 MED ORDER — ACYCLOVIR 800 MG PO TABS: 800.0000 mg | ORAL_TABLET | Freq: Two times a day (BID) | ORAL | 0 refills | Status: DC | PRN

## 2020-04-07 MED ORDER — ROSUVASTATIN CALCIUM 10 MG PO TABS: 10.0000 mg | ORAL_TABLET | Freq: Every day | ORAL | 1 refills | Status: DC

## 2020-04-07 NOTE — Progress Notes (Signed)
Patient would like Acyclovir sent to [facility] in [place] and Crestor to her mail order pharmacy

## 2020-04-07 NOTE — Progress Notes (Signed)
Patient ID: [name], female   DOB: December 08, 1945, 74 y.o.   MRN: [id number]   Subjective:    Patient ID: [name], female    DOB: [birth year], 74 y.o.   MRN: [id number]  HPI This visit occurred during the SARS-CoV-2 public health emergency.  Safety protocols were in place, including screening questions prior to the visit, additional usage of staff PPE, and extensive cleaning of exam room while observing appropriate contact time as indicated for disinfecting solutions.  Patient here for her physical exam.  She reports she is doing relatively well.  Has been seeing Dr [name] for allergies and nasal congestion.  Has been using steroid nasal spray.  Seeing Dr [name].  Noticed tingling in her face.  Took a break from the nasal spray.  Still with some tingling.  No facial drooping.  No headache.  No neurological change.  States noticed prior to her facial injections.  Discussed further w/up.  Wants to monitor.  No chest pain or sob reported.  No abdominal pain or bowel change reported.  Request refill for acyclovir to have if needed for fever blister flares.  Saw endocrinology for hot flashes.  Started on estroven.  Did not help.  Overall stable.    Past Medical History:  Diagnosis Date  . Arthritis   . Environmental allergies   . Esophagitis    Barrets, followed by Dr [name]  . GERD (gastroesophageal reflux disease)   . Hypercholesterolemia   . Hypothyroidism    Past Surgical History:  Procedure Laterality Date  . [place]  . KNEE ARTHROSCOPY  12/03/07   left  . KNEE ARTHROSCOPY  06/25/08   left  . NASAL SINUS SURGERY  12/06  . REPLACEMENT TOTAL KNEE  01/04/09   left  . RHINOPLASTY  1986  . VAGINAL HYSTERECTOMY  1980   ovaries left in place  . WRIST SURGERY  1970   cyst removal   Family History  Problem Relation Age of Onset  . Heart disease Father        heart attack  . Diabetes Father   . Hyperlipidemia Paternal Grandmother   . Diabetes Paternal Grandmother     . Stroke Paternal Grandmother   . Hyperlipidemia Paternal Grandfather   . Diabetes Paternal Grandfather   . Prostate cancer Maternal Uncle   . Pancreatic cancer Cousin   . Multiple myeloma Brother   . Breast cancer Maternal Aunt   . Breast cancer Maternal Aunt    Social History   Socioeconomic History  . Marital status: Married    Spouse name: Not on file  . Number of children: Not on file  . Years of education: Not on file  . Highest education level: Not on file  Occupational History  . Not on file  Tobacco Use  . Smoking status: Former Smoker    Years: 5.00    Types: Cigarettes  . Smokeless tobacco: Never Used  Vaping Use  . Vaping Use: Never used  Substance and Sexual Activity  . Alcohol use: Yes    Alcohol/week: 0.0 standard drinks    Comment: rare  . Drug use: No  . Sexual activity: Yes  Other Topics Concern  . Not on file  Social History Narrative  . Not on file   Social Determinants of Health   Financial Resource Strain: Low Risk   . Difficulty of Paying Living Expenses: Not hard at all  Food Insecurity: No Food Insecurity  . Worried About  Running Out of Food in the Last Year: Never true  . Ran Out of Food in the Last Year: Never true  Transportation Needs: No Transportation Needs  . Lack of Transportation (Medical): No  . Lack of Transportation (Non-Medical): No  Physical Activity: Insufficiently Active  . Days of Exercise per Week: 2 days  . Minutes of Exercise per Session: 60 min  Stress: No Stress Concern Present  . Feeling of Stress : Not at all  Social Connections: Unknown  . Frequency of Communication with Friends and Family: More than three times a week  . Frequency of Social Gatherings with Friends and Family: Not on file  . Attends Religious Services: Not on file  . Active Member of Clubs or Organizations: Not on file  . Attends Archivist Meetings: Not on file  . Marital Status: Married    Outpatient Encounter Medications as  of 04/07/2020  Medication Sig  . acetaminophen (TYLENOL) 500 MG tablet Take 500 mg by mouth every 6 (six) hours as needed.  Marland Kitchen acyclovir (ZOVIRAX) 800 MG tablet Take 1 tablet (800 mg total) by mouth 2 (two) times daily as needed.  . Ascorbic Acid (VITAMIN C) 500 MG CAPS Take 500 mg by mouth daily.  Marland Kitchen BIOTIN 5000 PO Take 10,000 mcg by mouth daily.   . budesonide (PULMICORT) 1 MG/2ML nebulizer solution ADD 1ML OF MEDICATION TO 240ML OF SALINE IN SALINE IRRIGATION BOTTLE; IRRIGATE SINUSES WITH 120ML THROUGH EACH NOSTRIL TWICE DAILY  . BUDESONIDE PO 2 (two) times daily.  . busPIRone (BUSPAR) 5 MG tablet Take 5 mg by mouth daily as needed.  . Cholecalciferol (VITAMIN D PO) Take 10,000 Units by mouth daily.   . Coenzyme Q10 (COQ-10 PO) Take 120 mg by mouth daily.  . COLLAGEN PO Take 11 mg by mouth daily.  . Cyanocobalamin (B-12 PO) Take 5,000 mcg by mouth daily.  . diclofenac sodium (VOLTAREN) 1 % GEL Apply 1 application topically as needed.  Marland Kitchen ELDERBERRY PO Take 125 mg by mouth daily.   . fluocinonide cream (LIDEX) 0.98 % Apply 1 application topically 2 (two) times daily as needed.   . folic acid (FOLVITE) 119 MCG tablet Take 800 mcg by mouth 2 (two) times daily.  . Hypertonic Nasal Wash (SINUS RINSE NA) Place into the nose as needed.  Marland Kitchen LEVOFLOXACIN PO 2 (two) times daily. levofloxacin 147m/ml cash  ADD 1ML OF MEDICATION TO 240ML OF SALINE IN SALINE IRRIGATION BOTTLE; IRRIGATE  . levothyroxine (SYNTHROID) 50 MCG tablet TAKE 1 TABLET EVERY OTHER  DAY  . levothyroxine (SYNTHROID) 75 MCG tablet TAKE 1 TABLET EVERY OTHER  DAY  . Omega-3 Fatty Acids (FISH OIL PO) Take 1,200 mg by mouth daily.  .Marland Kitchenomeprazole (PRILOSEC) 40 MG capsule Take 40 mg by mouth daily.  . Probiotic Product (PROBIOTIC DAILY PO) Take 1 capsule by mouth.  . rosuvastatin (CRESTOR) 10 MG tablet Take 1 tablet (10 mg total) by mouth daily.  . sodium chloride irrigation 0.9 % irrigation sodium chloride 0.9 % irrigation solution  .  vitamin E 400 UNIT capsule Take 400 Units by mouth daily.  . [DISCONTINUED] acyclovir (ZOVIRAX) 800 MG tablet Take 800 mg by mouth 2 (two) times daily as needed.   . [DISCONTINUED] rosuvastatin (CRESTOR) 10 MG tablet Take 1 tablet (10 mg total) by mouth daily.  . [DISCONTINUED] fluticasone (FLONASE) 50 MCG/ACT nasal spray Place 2 sprays into both nostrils daily.   No facility-administered encounter medications on file as of 04/07/2020.  Review of Systems  Constitutional: Negative for appetite change and unexpected weight change.  HENT: Negative for congestion, sinus pressure and sore throat.   Eyes: Negative for pain and visual disturbance.  Respiratory: Negative for cough, chest tightness and shortness of breath.   Cardiovascular: Negative for chest pain, palpitations and leg swelling.  Gastrointestinal: Negative for abdominal pain, diarrhea, nausea and vomiting.  Genitourinary: Negative for difficulty urinating and dysuria.  Musculoskeletal: Negative for joint swelling and myalgias.  Skin: Negative for color change and rash.  Neurological: Negative for dizziness, light-headedness and headaches.  Hematological: Negative for adenopathy. Does not bruise/bleed easily.  Psychiatric/Behavioral: Negative for agitation and dysphoric mood.       Objective:    Physical Exam Constitutional:      General: She is not in acute distress.    Appearance: Normal appearance. She is well-developed.  HENT:     Head: Normocephalic and atraumatic.     Right Ear: External ear normal.     Left Ear: External ear normal.  Eyes:     General: No scleral icterus.       Right eye: No discharge.        Left eye: No discharge.     Conjunctiva/sclera: Conjunctivae normal.  Neck:     Thyroid: No thyromegaly.  Cardiovascular:     Rate and Rhythm: Normal rate and regular rhythm.  Pulmonary:     Effort: No tachypnea, accessory muscle usage or respiratory distress.     Breath sounds: Normal breath sounds.  No decreased breath sounds or wheezing.  Chest:     Breasts:        Right: No inverted nipple, mass, nipple discharge or tenderness (no axillary adenopathy).        Left: No inverted nipple, mass, nipple discharge or tenderness (no axilarry adenopathy).  Abdominal:     General: Bowel sounds are normal.     Palpations: Abdomen is soft.     Tenderness: There is no abdominal tenderness.  Musculoskeletal:        General: No swelling or tenderness.     Cervical back: Neck supple. No tenderness.  Lymphadenopathy:     Cervical: No cervical adenopathy.  Skin:    Findings: No erythema or rash.  Neurological:     Mental Status: She is alert and oriented to person, place, and time.  Psychiatric:        Mood and Affect: Mood normal.        Behavior: Behavior normal.     BP 130/84   Pulse (!) 58   Temp 97.7 F (36.5 C) (Oral)   Ht 5' 2.6" (1.59 m)   Wt 142 lb 6.4 oz (64.6 kg)   SpO2 97%   BMI 25.55 kg/m  Wt Readings from Last 3 Encounters:  04/07/20 142 lb 6.4 oz (64.6 kg)  02/20/20 141 lb 8 oz (64.2 kg)  01/16/20 144 lb (65.3 kg)     Lab Results  Component Value Date   WBC 5.5 09/23/2019   HGB 13.0 09/23/2019   HCT 38.1 09/23/2019   PLT 252.0 09/23/2019   GLUCOSE 89 02/06/2020   CHOL 195 02/06/2020   TRIG 115.0 02/06/2020   HDL 57.70 02/06/2020   LDLDIRECT 123.4 02/27/2013   LDLCALC 115 (H) 02/06/2020   ALT 14 02/06/2020   AST 18 02/06/2020   NA 139 02/06/2020   K 4.2 02/06/2020   CL 103 02/06/2020   CREATININE 0.76 02/06/2020   BUN 15 02/06/2020   CO2 30  02/06/2020   TSH 0.72 09/23/2019   HGBA1C 5.6 02/06/2020    MM 3D SCREEN BREAST BILATERAL  Result Date: 04/01/2020 CLINICAL DATA:  Screening. EXAM: DIGITAL SCREENING BILATERAL MAMMOGRAM WITH TOMO AND CAD COMPARISON:  Previous exam(s). ACR Breast Density Category c: The breast tissue is heterogeneously dense, which may obscure small masses. FINDINGS: There are no findings suspicious for malignancy. Images were  processed with CAD. IMPRESSION: No mammographic evidence of malignancy. A result letter of this screening mammogram will be mailed directly to the patient. RECOMMENDATION: Screening mammogram in one year. (Code:SM-B-01Y) BI-RADS CATEGORY  1: Negative. Electronically Signed   By: Ammie Ferrier M.D.   On: 04/01/2020 08:02       Assessment & Plan:   Problem List Items Addressed This Visit    Hypothyroidism    On thyroid replacement.  Follow tsh.       Hyperglycemia    Low carb diet and exercise. Follow met b and a1c.       Relevant Orders   Hemoglobin A1c   Hypercholesteremia    On crestor.  Low cholesterol diet and exercise.  Follow lipid panel and liver function tests.        Relevant Medications   rosuvastatin (CRESTOR) 10 MG tablet   Other Relevant Orders   Hepatic function panel   Lipid panel   Basic metabolic panel   Hot flashes    estroven did not help.  Will notify me if desires effexor.        Relevant Medications   rosuvastatin (CRESTOR) 10 MG tablet   Health care maintenance    Colonoscopy 01/15/18.  Recommended f/u in 5 years.  Physical today 04/07/20.  Mammogram 04/01/20 - birads I.       GERD (gastroesophageal reflux disease)    S/p EGD 11/2019.  On omeprazole.  Symptoms appeared to be controlled.         Other Visit Diagnoses    Needs flu shot    -  Primary   Relevant Orders   Flu Vaccine QUAD High Dose(Fluad)   Need for immunization against influenza       Relevant Orders   Flu Vaccine QUAD High Dose(Fluad) (Completed)       Einar Pheasant, MD

## 2020-04-12 ENCOUNTER — Encounter: Payer: Self-pay | Admitting: Internal Medicine

## 2020-04-12 DIAGNOSIS — R739 Hyperglycemia, unspecified: Secondary | ICD-10-CM | POA: Insufficient documentation

## 2020-04-12 NOTE — Assessment & Plan Note (Addendum)
Colonoscopy 01/15/18.  Recommended f/u in 5 years.  Physical today 04/07/20.  Mammogram 04/01/20 - birads I.

## 2020-04-12 NOTE — Assessment & Plan Note (Signed)
On thyroid replacement.  Follow tsh.  

## 2020-04-12 NOTE — Assessment & Plan Note (Signed)
On crestor.  Low cholesterol diet and exercise.  Follow lipid panel and liver function tests.   

## 2020-04-12 NOTE — Assessment & Plan Note (Signed)
S/p EGD 11/2019.  On omeprazole.  Symptoms appeared to be controlled.

## 2020-04-12 NOTE — Assessment & Plan Note (Signed)
estroven did not help.  Will notify me if desires effexor.

## 2020-04-12 NOTE — Assessment & Plan Note (Signed)
Low carb diet and exercise. Follow met b and a1c.  

## 2020-04-13 ENCOUNTER — Encounter: Payer: Self-pay | Admitting: Internal Medicine

## 2020-04-22 ENCOUNTER — Telehealth: Payer: Self-pay | Admitting: Internal Medicine

## 2020-04-22 NOTE — Telephone Encounter (Signed)
Medication Adherence review.

## 2020-04-28 DIAGNOSIS — M5033 Other cervical disc degeneration, cervicothoracic region: Secondary | ICD-10-CM | POA: Diagnosis not present

## 2020-04-28 DIAGNOSIS — M9901 Segmental and somatic dysfunction of cervical region: Secondary | ICD-10-CM | POA: Diagnosis not present

## 2020-04-28 DIAGNOSIS — M5412 Radiculopathy, cervical region: Secondary | ICD-10-CM | POA: Diagnosis not present

## 2020-04-28 DIAGNOSIS — M9903 Segmental and somatic dysfunction of lumbar region: Secondary | ICD-10-CM | POA: Diagnosis not present

## 2020-04-30 ENCOUNTER — Other Ambulatory Visit: Payer: Self-pay | Admitting: Internal Medicine

## 2020-05-01 ENCOUNTER — Other Ambulatory Visit: Payer: Self-pay | Admitting: Internal Medicine

## 2020-05-05 DIAGNOSIS — R591 Generalized enlarged lymph nodes: Secondary | ICD-10-CM | POA: Diagnosis not present

## 2020-05-05 DIAGNOSIS — J324 Chronic pansinusitis: Secondary | ICD-10-CM | POA: Diagnosis not present

## 2020-05-13 DIAGNOSIS — M5033 Other cervical disc degeneration, cervicothoracic region: Secondary | ICD-10-CM | POA: Diagnosis not present

## 2020-05-13 DIAGNOSIS — M9901 Segmental and somatic dysfunction of cervical region: Secondary | ICD-10-CM | POA: Diagnosis not present

## 2020-05-13 DIAGNOSIS — M5412 Radiculopathy, cervical region: Secondary | ICD-10-CM | POA: Diagnosis not present

## 2020-05-13 DIAGNOSIS — M9903 Segmental and somatic dysfunction of lumbar region: Secondary | ICD-10-CM | POA: Diagnosis not present

## 2020-05-26 DIAGNOSIS — M5033 Other cervical disc degeneration, cervicothoracic region: Secondary | ICD-10-CM | POA: Diagnosis not present

## 2020-05-26 DIAGNOSIS — M19071 Primary osteoarthritis, right ankle and foot: Secondary | ICD-10-CM | POA: Diagnosis not present

## 2020-05-26 DIAGNOSIS — M19072 Primary osteoarthritis, left ankle and foot: Secondary | ICD-10-CM | POA: Diagnosis not present

## 2020-05-26 DIAGNOSIS — M9901 Segmental and somatic dysfunction of cervical region: Secondary | ICD-10-CM | POA: Diagnosis not present

## 2020-05-26 DIAGNOSIS — M9903 Segmental and somatic dysfunction of lumbar region: Secondary | ICD-10-CM | POA: Diagnosis not present

## 2020-05-26 DIAGNOSIS — M5412 Radiculopathy, cervical region: Secondary | ICD-10-CM | POA: Diagnosis not present

## 2020-06-01 DIAGNOSIS — M9901 Segmental and somatic dysfunction of cervical region: Secondary | ICD-10-CM | POA: Diagnosis not present

## 2020-06-01 DIAGNOSIS — M5412 Radiculopathy, cervical region: Secondary | ICD-10-CM | POA: Diagnosis not present

## 2020-06-01 DIAGNOSIS — M5033 Other cervical disc degeneration, cervicothoracic region: Secondary | ICD-10-CM | POA: Diagnosis not present

## 2020-06-01 DIAGNOSIS — M9903 Segmental and somatic dysfunction of lumbar region: Secondary | ICD-10-CM | POA: Diagnosis not present

## 2020-06-09 ENCOUNTER — Other Ambulatory Visit (INDEPENDENT_AMBULATORY_CARE_PROVIDER_SITE_OTHER): Payer: Medicare HMO

## 2020-06-09 ENCOUNTER — Other Ambulatory Visit: Payer: Self-pay

## 2020-06-09 DIAGNOSIS — R739 Hyperglycemia, unspecified: Secondary | ICD-10-CM

## 2020-06-09 DIAGNOSIS — E7800 Pure hypercholesterolemia, unspecified: Secondary | ICD-10-CM | POA: Diagnosis not present

## 2020-06-09 DIAGNOSIS — M5033 Other cervical disc degeneration, cervicothoracic region: Secondary | ICD-10-CM | POA: Diagnosis not present

## 2020-06-09 DIAGNOSIS — M9903 Segmental and somatic dysfunction of lumbar region: Secondary | ICD-10-CM | POA: Diagnosis not present

## 2020-06-09 DIAGNOSIS — M9901 Segmental and somatic dysfunction of cervical region: Secondary | ICD-10-CM | POA: Diagnosis not present

## 2020-06-09 DIAGNOSIS — M5412 Radiculopathy, cervical region: Secondary | ICD-10-CM | POA: Diagnosis not present

## 2020-06-09 LAB — BASIC METABOLIC PANEL WITH GFR
BUN: 18 mg/dL (ref 6–23)
CO2: 32 meq/L (ref 19–32)
Calcium: 9.8 mg/dL (ref 8.4–10.5)
Chloride: 103 meq/L (ref 96–112)
Creatinine, Ser: 0.69 mg/dL (ref 0.40–1.20)
GFR: 85 mL/min
Glucose, Bld: 90 mg/dL (ref 70–99)
Potassium: 4.4 meq/L (ref 3.5–5.1)
Sodium: 139 meq/L (ref 135–145)

## 2020-06-09 LAB — LIPID PANEL
Cholesterol: 221 mg/dL — ABNORMAL HIGH (ref 0–200)
HDL: 66.7 mg/dL
LDL Cholesterol: 128 mg/dL — ABNORMAL HIGH (ref 0–99)
NonHDL: 154.68
Total CHOL/HDL Ratio: 3
Triglycerides: 133 mg/dL (ref 0.0–149.0)
VLDL: 26.6 mg/dL (ref 0.0–40.0)

## 2020-06-09 LAB — HEPATIC FUNCTION PANEL
ALT: 18 U/L (ref 0–35)
AST: 19 U/L (ref 0–37)
Albumin: 4.4 g/dL (ref 3.5–5.2)
Alkaline Phosphatase: 64 U/L (ref 39–117)
Bilirubin, Direct: 0.1 mg/dL (ref 0.0–0.3)
Total Bilirubin: 0.5 mg/dL (ref 0.2–1.2)
Total Protein: 6.8 g/dL (ref 6.0–8.3)

## 2020-06-09 LAB — HEMOGLOBIN A1C: Hgb A1c MFr Bld: 5.6 % (ref 4.6–6.5)

## 2020-06-15 DIAGNOSIS — H2513 Age-related nuclear cataract, bilateral: Secondary | ICD-10-CM | POA: Diagnosis not present

## 2020-06-15 DIAGNOSIS — H04123 Dry eye syndrome of bilateral lacrimal glands: Secondary | ICD-10-CM | POA: Diagnosis not present

## 2020-06-15 DIAGNOSIS — H0288A Meibomian gland dysfunction right eye, upper and lower eyelids: Secondary | ICD-10-CM | POA: Diagnosis not present

## 2020-06-15 DIAGNOSIS — H0288B Meibomian gland dysfunction left eye, upper and lower eyelids: Secondary | ICD-10-CM | POA: Diagnosis not present

## 2020-06-15 DIAGNOSIS — H5203 Hypermetropia, bilateral: Secondary | ICD-10-CM | POA: Diagnosis not present

## 2020-06-18 ENCOUNTER — Other Ambulatory Visit: Payer: Self-pay

## 2020-06-18 ENCOUNTER — Ambulatory Visit (INDEPENDENT_AMBULATORY_CARE_PROVIDER_SITE_OTHER): Payer: Medicare HMO | Admitting: Internal Medicine

## 2020-06-18 DIAGNOSIS — F439 Reaction to severe stress, unspecified: Secondary | ICD-10-CM

## 2020-06-18 DIAGNOSIS — E7800 Pure hypercholesterolemia, unspecified: Secondary | ICD-10-CM

## 2020-06-18 DIAGNOSIS — M79671 Pain in right foot: Secondary | ICD-10-CM

## 2020-06-18 DIAGNOSIS — E039 Hypothyroidism, unspecified: Secondary | ICD-10-CM

## 2020-06-18 DIAGNOSIS — K2100 Gastro-esophageal reflux disease with esophagitis, without bleeding: Secondary | ICD-10-CM | POA: Diagnosis not present

## 2020-06-18 DIAGNOSIS — R69 Illness, unspecified: Secondary | ICD-10-CM | POA: Diagnosis not present

## 2020-06-18 DIAGNOSIS — R739 Hyperglycemia, unspecified: Secondary | ICD-10-CM | POA: Diagnosis not present

## 2020-06-18 MED ORDER — ROSUVASTATIN CALCIUM 10 MG PO TABS: ORAL_TABLET | ORAL | 1 refills | Status: DC

## 2020-06-18 NOTE — Patient Instructions (Signed)
Increase crestor to 20mg  on Tuesday and Thursday and 10mg  all other days.

## 2020-06-18 NOTE — Progress Notes (Signed)
Patient ID: [name], female   DOB: 10/05/1945, 75 y.o.   MRN: [id number]   Subjective:    Patient ID: [name], female    DOB: 10/05/1945, 75 y.o.   MRN: [id number]  HPI This visit occurred during the SARS-CoV-2 public health emergency.  Safety protocols were in place, including screening questions prior to the visit, additional usage of staff PPE, and extensive cleaning of exam room while observing appropriate contact time as indicated for disinfecting solutions.  Patient here for a scheduled follow up.  Here to follow up regarding her cholesterol, thyroid and reflux.  She reports she is doing relatively well.  Planning to go to [place] this week and stay until March.  Tries to stay active.  No chest pain or sob reported.  Acid reflux appears to be controlled.  Followed by GI.  No abdominal pain or bowel change reported.  Left foot is better.  Saw [facility]. Gets adjustments from chiropractor.  Overall feels things are stable.  Discussed lab results.    Past Medical History:  Diagnosis Date  . Arthritis   . Environmental allergies   . Esophagitis    [place]rets, followed by Dr Elsie AmisNat Mann  . GERD (gastroesophageal reflux disease)   . Hypercholesterolemia   . Hypothyroidism    Past Surgical History:  Procedure Laterality Date  . ANKLE SURGERY  1967  . KNEE ARTHROSCOPY  12/03/07   left  . KNEE ARTHROSCOPY  06/25/08   left  . NASAL SINUS SURGERY  12/06  . REPLACEMENT TOTAL KNEE  01/04/09   left  . RHINOPLASTY  1986  . VAGINAL HYSTERECTOMY  1980   ovaries left in place  . WRIST SURGERY  1970   cyst removal   Family History  Problem Relation Age of Onset  . Heart disease Father        heart attack  . Diabetes Father   . Hyperlipidemia Paternal Grandmother   . Diabetes Paternal Grandmother   . Stroke Paternal Grandmother   . Hyperlipidemia Paternal Grandfather   . Diabetes Paternal Grandfather   . Prostate cancer Maternal Uncle   . Pancreatic cancer Cousin   . Multiple  myeloma Brother   . Breast cancer Maternal Aunt   . Breast cancer Maternal Aunt    Social History   Socioeconomic History  . Marital status: Married    Spouse name: Not on file  . Number of children: Not on file  . Years of education: Not on file  . Highest education level: Not on file  Occupational History  . Not on file  Tobacco Use  . Smoking status: Former Smoker    Years: 5.00    Types: Cigarettes  . Smokeless tobacco: Never Used  Vaping Use  . Vaping Use: Never used  Substance and Sexual Activity  . Alcohol use: Yes    Alcohol/week: 0.0 standard drinks    Comment: rare  . Drug use: No  . Sexual activity: Yes  Other Topics Concern  . Not on file  Social History Narrative  . Not on file   Social Determinants of Health   Financial Resource Strain: Low Risk   . Difficulty of Paying Living Expenses: Not hard at all  Food Insecurity: No Food Insecurity  . Worried About Programme researcher, broadcasting/film/videounning Out of Food in the Last Year: Never true  . Ran Out of Food in the Last Year: Never true  Transportation Needs: No Transportation Needs  . Lack of Transportation (Medical): No  .  Lack of Transportation (Non-Medical): No  Physical Activity: Insufficiently Active  . Days of Exercise per Week: 2 days  . Minutes of Exercise per Session: 60 min  Stress: No Stress Concern Present  . Feeling of Stress : Not at all  Social Connections: Unknown  . Frequency of Communication with Friends and Family: More than three times a week  . Frequency of Social Gatherings with Friends and Family: Not on file  . Attends Religious Services: Not on file  . Active Member of Clubs or Organizations: Not on file  . Attends Banker Meetings: Not on file  . Marital Status: Married    Outpatient Encounter Medications as of 06/18/2020  Medication Sig  . acetaminophen (TYLENOL) 500 MG tablet Take 500 mg by mouth every 6 (six) hours as needed.  Marland Kitchen acyclovir (ZOVIRAX) 800 MG tablet TAKE 1 TABLET BY MOUTH 2  TIMES DAILY AS NEEDED.  . Ascorbic Acid (VITAMIN C) 500 MG CAPS Take 500 mg by mouth daily.  Marland Kitchen BIOTIN 5000 PO Take 10,000 mcg by mouth daily.   . budesonide (PULMICORT) 1 MG/2ML nebulizer solution ADD OF MEDICATION TO OF SALINE IN SALINE IRRIGATION BOTTLE; IRRIGATE SINUSES WITH THROUGH EACH NOSTRIL TWICE DAILY  . BUDESONIDE PO 2 (two) times daily.  . busPIRone (BUSPAR) 5 MG tablet Take 5 mg by mouth daily as needed.  . Cholecalciferol (VITAMIN D PO) Take 10,000 Units by mouth daily.   . Coenzyme Q10 (COQ-10 PO) Take 120 mg by mouth daily.  . COLLAGEN PO Take 11 mg by mouth daily.  . Cyanocobalamin (B-12 PO) Take 5,000 mcg by mouth daily.  . diclofenac sodium (VOLTAREN) 1 % GEL Apply 1 application topically as needed.  Marland Kitchen ELDERBERRY PO Take 125 mg by mouth daily.   . fluocinonide cream (LIDEX) 0.05 % Apply 1 application topically 2 (two) times daily as needed.   . folic acid (FOLVITE) 800 MCG tablet Take 800 mcg by mouth 2 (two) times daily.  . Hypertonic Nasal Wash (SINUS RINSE NA) Place into the nose as needed.  Marland Kitchen LEVOFLOXACIN PO 2 (two) times daily. levofloxacin 100mg /ml cash  ADD OF MEDICATION TO OF SALINE IN SALINE IRRIGATION BOTTLE; IRRIGATE  . levothyroxine (SYNTHROID) 50 MCG tablet TAKE 1 TABLET EVERY OTHER  DAY  . levothyroxine (SYNTHROID) 75 MCG tablet TAKE 1 TABLET EVERY OTHER  DAY  . Omega-3 Fatty Acids (FISH OIL PO) Take 1,200 mg by mouth daily.  omeprazole (PRILOSEC) 40 MG capsule Take 40 mg by mouth daily.  . Probiotic Product (PROBIOTIC DAILY PO) Take 1 capsule by mouth.  . rosuvastatin (CRESTOR) 10 MG tablet Take 20mg  on Monday and Thursday and 10mg  all other days.  . sodium chloride irrigation 0.9 % irrigation sodium chloride 0.9 % irrigation solution  . vitamin E 400 UNIT capsule Take 400 Units by mouth daily.  . [DISCONTINUED] rosuvastatin (CRESTOR) 10 MG tablet Take 1 tablet (10 mg total) by mouth daily.   No facility-administered encounter  medications on file as of 06/18/2020.    Review of Systems  Constitutional: Negative for appetite change and unexpected weight change.  HENT: Negative for congestion, sinus pressure and sore throat.   Respiratory: Negative for cough, chest tightness and shortness of breath.   Cardiovascular: Negative for chest pain, palpitations and leg swelling.  Gastrointestinal: Negative for abdominal pain, diarrhea, nausea and vomiting.  Genitourinary: Negative for difficulty urinating and dysuria.  Musculoskeletal: Negative for joint swelling and myalgias.  Skin: Negative  for color change and rash.  Neurological: Negative for dizziness, light-headedness and headaches.  Psychiatric/Behavioral: Negative for agitation and dysphoric mood.       Objective:    Physical Exam Vitals reviewed.  Constitutional:      General: She is not in acute distress.    Appearance: Normal appearance.  HENT:     Head: Normocephalic and atraumatic.     Right Ear: External ear normal.     Left Ear: External ear normal.     Mouth/Throat:     Mouth: Oropharynx is clear and moist.  Eyes:     General: No scleral icterus.       Right eye: No discharge.        Left eye: No discharge.     Conjunctiva/sclera: Conjunctivae normal.  Neck:     Thyroid: No thyromegaly.  Cardiovascular:     Rate and Rhythm: Normal rate and regular rhythm.  Pulmonary:     Effort: No respiratory distress.     Breath sounds: Normal breath sounds. No wheezing.  Abdominal:     General: Bowel sounds are normal.     Palpations: Abdomen is soft.     Tenderness: There is no abdominal tenderness.  Musculoskeletal:        General: No swelling, tenderness or edema.     Cervical back: Neck supple. No tenderness.  Lymphadenopathy:     Cervical: No cervical adenopathy.  Skin:    Findings: No erythema or rash.  Neurological:     Mental Status: She is alert.  Psychiatric:        Mood and Affect: Mood normal.        Behavior: Behavior normal.      BP 118/72   Pulse 75   Temp 98 F (36.7 C) (Oral)   Resp 16   Ht 5\' 3"  (1.6 m)   Wt 144 lb (65.3 kg)   SpO2 98%   BMI 25.51 kg/m  Wt Readings from Last 3 Encounters:  06/18/20 144 lb (65.3 kg)  04/07/20 142 lb 6.4 oz (64.6 kg)  02/20/20 141 lb 8 oz (64.2 kg)     Lab Results  Component Value Date   WBC 5.5 09/23/2019   HGB 13.0 09/23/2019   HCT 38.1 09/23/2019   PLT 252.0 09/23/2019   GLUCOSE 90 06/09/2020   CHOL 221 (H) 06/09/2020   TRIG 133.0 06/09/2020   HDL 66.70 06/09/2020   LDLDIRECT 123.4 02/27/2013   LDLCALC 128 (H) 06/09/2020   ALT 18 06/09/2020   AST 19 06/09/2020   NA 139 06/09/2020   K 4.4 06/09/2020   CL 103 06/09/2020   CREATININE 0.69 06/09/2020   BUN 18 06/09/2020   CO2 32 06/09/2020   TSH 0.72 09/23/2019   HGBA1C 5.6 06/09/2020    MM 3D SCREEN BREAST BILATERAL  Result Date: 04/01/2020 CLINICAL DATA:  Screening. EXAM: DIGITAL SCREENING BILATERAL MAMMOGRAM WITH TOMO AND CAD COMPARISON:  Previous exam(s). ACR Breast Density Category c: The breast tissue is heterogeneously dense, which may obscure small masses. FINDINGS: There are no findings suspicious for malignancy. Images were processed with CAD. IMPRESSION: No mammographic evidence of malignancy. A result letter of this screening mammogram will be mailed directly to the patient. RECOMMENDATION: Screening mammogram in one year. (Code:SM-B-01Y) BI-RADS CATEGORY  1: Negative. Electronically Signed   By: Ammie Ferrier M.D.   On: 04/01/2020 08:02       Assessment & Plan:   Problem List Items Addressed This Visit  Hypercholesteremia    On crestor.  Reviewed recent lab with her.  LDL 128.  Discussed increasing crestor. She is agreeable to increase to 20mg  two days per week and continue 10mg  q day the other days.  Follow lipid panel.       Relevant Medications   rosuvastatin (CRESTOR) 10 MG tablet   Other Relevant Orders   CBC with Differential/Platelet   Lipid panel   Hepatic  function panel   Basic metabolic panel   Hypothyroidism    On thyroid replacement.  Follow tsh.       Relevant Orders   TSH   GERD (gastroesophageal reflux disease)    S/p EGD 11/2019.  Symptoms controlled on prilosec.        Stress    Overall appears to be handling things well.  Follow.        Right foot pain    Previous left foot pain.  Is better. Saw ortho.  Still some right foot pain.  Desires no further intervention. Supports.  Follow.        Hyperglycemia    Low carb diet and exercise.  Recent a1c 5.6. 06/09/20.        Relevant Orders   Hemoglobin A1c       Einar Pheasant, MD

## 2020-06-20 ENCOUNTER — Encounter: Payer: Self-pay | Admitting: Internal Medicine

## 2020-06-20 NOTE — Assessment & Plan Note (Signed)
S/p EGD 11/2019.  Symptoms controlled on prilosec.  

## 2020-06-20 NOTE — Assessment & Plan Note (Signed)
Previous left foot pain.  Is better. Saw ortho.  Still some right foot pain.  Desires no further intervention. Supports.  Follow.

## 2020-06-20 NOTE — Assessment & Plan Note (Signed)
Overall appears to be handling things well.  Follow.  ?

## 2020-06-20 NOTE — Assessment & Plan Note (Signed)
Low carb diet and exercise.  Recent a1c 5.6. 06/09/20.

## 2020-06-20 NOTE — Assessment & Plan Note (Signed)
On thyroid replacement.  Follow tsh.  

## 2020-06-20 NOTE — Assessment & Plan Note (Addendum)
On crestor.  Reviewed recent lab with her.  LDL 128.  Discussed increasing crestor. She is agreeable to increase to 20mg  two days per week and continue 10mg  q day the other days.  Follow lipid panel.

## 2020-06-22 DIAGNOSIS — M9901 Segmental and somatic dysfunction of cervical region: Secondary | ICD-10-CM | POA: Diagnosis not present

## 2020-06-22 DIAGNOSIS — Z0100 Encounter for examination of eyes and vision without abnormal findings: Secondary | ICD-10-CM | POA: Diagnosis not present

## 2020-06-22 DIAGNOSIS — M5033 Other cervical disc degeneration, cervicothoracic region: Secondary | ICD-10-CM | POA: Diagnosis not present

## 2020-06-22 DIAGNOSIS — M9903 Segmental and somatic dysfunction of lumbar region: Secondary | ICD-10-CM | POA: Diagnosis not present

## 2020-06-22 DIAGNOSIS — M5412 Radiculopathy, cervical region: Secondary | ICD-10-CM | POA: Diagnosis not present

## 2020-08-03 DIAGNOSIS — H6691 Otitis media, unspecified, right ear: Secondary | ICD-10-CM | POA: Diagnosis not present

## 2020-08-10 ENCOUNTER — Telehealth: Payer: Self-pay | Admitting: Internal Medicine

## 2020-08-10 MED ORDER — LEVOTHYROXINE SODIUM 75 MCG PO TABS: 75.0000 ug | ORAL_TABLET | ORAL | 3 refills | Status: DC

## 2020-08-10 MED ORDER — OMEPRAZOLE 40 MG PO CPDR: 40.0000 mg | DELAYED_RELEASE_CAPSULE | Freq: Every day | ORAL | 1 refills | Status: DC

## 2020-08-10 MED ORDER — LEVOTHYROXINE SODIUM 50 MCG PO TABS: 50.0000 ug | ORAL_TABLET | ORAL | 3 refills | Status: DC

## 2020-08-10 NOTE — Telephone Encounter (Signed)
Patient called in for refill for omeprazole (PRILOSEC) 40 MG capsule and levothyroxine (SYNTHROID) 50 MCG tablet  levothyroxine (SYNTHROID) 75 MCG table

## 2020-08-10 NOTE — Telephone Encounter (Signed)
Patient called and stated the medication below should be sent to [facility] [place]

## 2020-08-10 NOTE — Addendum Note (Signed)
Addended byElpidio Galea T on: 08/10/2020 01:37 PM   Modules accepted: Orders

## 2020-08-25 DIAGNOSIS — M5412 Radiculopathy, cervical region: Secondary | ICD-10-CM | POA: Diagnosis not present

## 2020-08-25 DIAGNOSIS — M5033 Other cervical disc degeneration, cervicothoracic region: Secondary | ICD-10-CM | POA: Diagnosis not present

## 2020-08-25 DIAGNOSIS — M9901 Segmental and somatic dysfunction of cervical region: Secondary | ICD-10-CM | POA: Diagnosis not present

## 2020-08-25 DIAGNOSIS — M9903 Segmental and somatic dysfunction of lumbar region: Secondary | ICD-10-CM | POA: Diagnosis not present

## 2020-09-03 ENCOUNTER — Telehealth: Payer: Self-pay | Admitting: Internal Medicine

## 2020-09-03 ENCOUNTER — Other Ambulatory Visit: Payer: Self-pay | Admitting: Internal Medicine

## 2020-09-03 MED ORDER — ROSUVASTATIN CALCIUM 10 MG PO TABS: ORAL_TABLET | ORAL | 1 refills | Status: DC

## 2020-09-03 NOTE — Telephone Encounter (Signed)
Pt needs a refill on rosuvastatin (CRESTOR) 10 MG tablet [facility]  Pt is no longer using mail order

## 2020-09-03 NOTE — Telephone Encounter (Signed)
Patient called in wants to go back to mail order because she does not want to pay [phone] at pharmacy want a call

## 2020-09-04 ENCOUNTER — Telehealth: Payer: Self-pay | Admitting: Internal Medicine

## 2020-09-04 ENCOUNTER — Other Ambulatory Visit: Payer: Self-pay

## 2020-09-04 MED ORDER — ROSUVASTATIN CALCIUM 10 MG PO TABS: ORAL_TABLET | ORAL | 1 refills | Status: DC

## 2020-09-04 NOTE — Telephone Encounter (Signed)
Crestor has been sent to mail order.

## 2020-09-04 NOTE — Telephone Encounter (Signed)
The patient received a message from her mail order that her medication (Crestor)  could not be filled until  May 20th The patient stated she would run out of medication. I did inform the patient because her instructions for her prescription had changed the pharmacy should accommodate her.

## 2020-09-08 NOTE — Telephone Encounter (Signed)
Confirmed with patient she has picked up her medication and they did not charge her for it

## 2020-09-09 DIAGNOSIS — M9903 Segmental and somatic dysfunction of lumbar region: Secondary | ICD-10-CM | POA: Diagnosis not present

## 2020-09-09 DIAGNOSIS — M5412 Radiculopathy, cervical region: Secondary | ICD-10-CM | POA: Diagnosis not present

## 2020-09-09 DIAGNOSIS — M5033 Other cervical disc degeneration, cervicothoracic region: Secondary | ICD-10-CM | POA: Diagnosis not present

## 2020-09-09 DIAGNOSIS — M9901 Segmental and somatic dysfunction of cervical region: Secondary | ICD-10-CM | POA: Diagnosis not present

## 2020-09-16 DIAGNOSIS — M5033 Other cervical disc degeneration, cervicothoracic region: Secondary | ICD-10-CM | POA: Diagnosis not present

## 2020-09-16 DIAGNOSIS — L821 Other seborrheic keratosis: Secondary | ICD-10-CM | POA: Diagnosis not present

## 2020-09-16 DIAGNOSIS — D225 Melanocytic nevi of trunk: Secondary | ICD-10-CM | POA: Diagnosis not present

## 2020-09-16 DIAGNOSIS — D1801 Hemangioma of skin and subcutaneous tissue: Secondary | ICD-10-CM | POA: Diagnosis not present

## 2020-09-16 DIAGNOSIS — M5412 Radiculopathy, cervical region: Secondary | ICD-10-CM | POA: Diagnosis not present

## 2020-09-16 DIAGNOSIS — L570 Actinic keratosis: Secondary | ICD-10-CM | POA: Diagnosis not present

## 2020-09-16 DIAGNOSIS — M9901 Segmental and somatic dysfunction of cervical region: Secondary | ICD-10-CM | POA: Diagnosis not present

## 2020-09-16 DIAGNOSIS — M9903 Segmental and somatic dysfunction of lumbar region: Secondary | ICD-10-CM | POA: Diagnosis not present

## 2020-09-16 DIAGNOSIS — L814 Other melanin hyperpigmentation: Secondary | ICD-10-CM | POA: Diagnosis not present

## 2020-09-22 DIAGNOSIS — M9901 Segmental and somatic dysfunction of cervical region: Secondary | ICD-10-CM | POA: Diagnosis not present

## 2020-09-22 DIAGNOSIS — M5033 Other cervical disc degeneration, cervicothoracic region: Secondary | ICD-10-CM | POA: Diagnosis not present

## 2020-09-22 DIAGNOSIS — M5412 Radiculopathy, cervical region: Secondary | ICD-10-CM | POA: Diagnosis not present

## 2020-09-22 DIAGNOSIS — M9903 Segmental and somatic dysfunction of lumbar region: Secondary | ICD-10-CM | POA: Diagnosis not present

## 2020-09-28 DIAGNOSIS — M5033 Other cervical disc degeneration, cervicothoracic region: Secondary | ICD-10-CM | POA: Diagnosis not present

## 2020-09-28 DIAGNOSIS — M9901 Segmental and somatic dysfunction of cervical region: Secondary | ICD-10-CM | POA: Diagnosis not present

## 2020-09-28 DIAGNOSIS — M5412 Radiculopathy, cervical region: Secondary | ICD-10-CM | POA: Diagnosis not present

## 2020-09-28 DIAGNOSIS — M9903 Segmental and somatic dysfunction of lumbar region: Secondary | ICD-10-CM | POA: Diagnosis not present

## 2020-10-19 ENCOUNTER — Other Ambulatory Visit: Payer: Self-pay

## 2020-10-19 ENCOUNTER — Other Ambulatory Visit (INDEPENDENT_AMBULATORY_CARE_PROVIDER_SITE_OTHER): Payer: Medicare HMO

## 2020-10-19 DIAGNOSIS — E7800 Pure hypercholesterolemia, unspecified: Secondary | ICD-10-CM

## 2020-10-19 DIAGNOSIS — M5033 Other cervical disc degeneration, cervicothoracic region: Secondary | ICD-10-CM | POA: Diagnosis not present

## 2020-10-19 DIAGNOSIS — R739 Hyperglycemia, unspecified: Secondary | ICD-10-CM

## 2020-10-19 DIAGNOSIS — E039 Hypothyroidism, unspecified: Secondary | ICD-10-CM

## 2020-10-19 DIAGNOSIS — M9903 Segmental and somatic dysfunction of lumbar region: Secondary | ICD-10-CM | POA: Diagnosis not present

## 2020-10-19 DIAGNOSIS — M9901 Segmental and somatic dysfunction of cervical region: Secondary | ICD-10-CM | POA: Diagnosis not present

## 2020-10-19 DIAGNOSIS — M5412 Radiculopathy, cervical region: Secondary | ICD-10-CM | POA: Diagnosis not present

## 2020-10-19 LAB — CBC WITH DIFFERENTIAL/PLATELET
Basophils Absolute: 0 10*3/uL (ref 0.0–0.1)
Basophils Relative: 0.9 % (ref 0.0–3.0)
Eosinophils Absolute: 0.1 10*3/uL (ref 0.0–0.7)
Eosinophils Relative: 1.6 % (ref 0.0–5.0)
HCT: 38.3 % (ref 36.0–46.0)
Hemoglobin: 13.3 g/dL (ref 12.0–15.0)
Lymphocytes Relative: 32.8 % (ref 12.0–46.0)
Lymphs Abs: 1.5 10*3/uL (ref 0.7–4.0)
MCHC: 34.6 g/dL (ref 30.0–36.0)
MCV: 94.3 fl (ref 78.0–100.0)
Monocytes Absolute: 0.4 10*3/uL (ref 0.1–1.0)
Monocytes Relative: 8.5 % (ref 3.0–12.0)
Neutro Abs: 2.5 10*3/uL (ref 1.4–7.7)
Neutrophils Relative %: 56.2 % (ref 43.0–77.0)
Platelets: 226 10*3/uL (ref 150.0–400.0)
RBC: 4.07 Mil/uL (ref 3.87–5.11)
RDW: 12.7 % (ref 11.5–15.5)
WBC: 4.5 10*3/uL (ref 4.0–10.5)

## 2020-10-19 LAB — BASIC METABOLIC PANEL WITH GFR
BUN: 13 mg/dL (ref 6–23)
CO2: 31 meq/L (ref 19–32)
Calcium: 9.8 mg/dL (ref 8.4–10.5)
Chloride: 104 meq/L (ref 96–112)
Creatinine, Ser: 0.6 mg/dL (ref 0.40–1.20)
GFR: 87.69 mL/min
Glucose, Bld: 94 mg/dL (ref 70–99)
Potassium: 4.1 meq/L (ref 3.5–5.1)
Sodium: 142 meq/L (ref 135–145)

## 2020-10-19 LAB — LIPID PANEL
Cholesterol: 180 mg/dL (ref 0–200)
HDL: 58.8 mg/dL
LDL Cholesterol: 91 mg/dL (ref 0–99)
NonHDL: 120.94
Total CHOL/HDL Ratio: 3
Triglycerides: 150 mg/dL — ABNORMAL HIGH (ref 0.0–149.0)
VLDL: 30 mg/dL (ref 0.0–40.0)

## 2020-10-19 LAB — HEPATIC FUNCTION PANEL
ALT: 20 U/L (ref 0–35)
AST: 23 U/L (ref 0–37)
Albumin: 4.5 g/dL (ref 3.5–5.2)
Alkaline Phosphatase: 62 U/L (ref 39–117)
Bilirubin, Direct: 0.1 mg/dL (ref 0.0–0.3)
Total Bilirubin: 0.5 mg/dL (ref 0.2–1.2)
Total Protein: 7.2 g/dL (ref 6.0–8.3)

## 2020-10-19 LAB — HEMOGLOBIN A1C: Hgb A1c MFr Bld: 5.6 % (ref 4.6–6.5)

## 2020-10-19 LAB — TSH: TSH: 1.87 u[IU]/mL (ref 0.35–4.50)

## 2020-10-21 ENCOUNTER — Encounter: Payer: Self-pay | Admitting: Internal Medicine

## 2020-10-21 ENCOUNTER — Other Ambulatory Visit: Payer: Self-pay

## 2020-10-21 ENCOUNTER — Ambulatory Visit (INDEPENDENT_AMBULATORY_CARE_PROVIDER_SITE_OTHER): Payer: Medicare HMO | Admitting: Internal Medicine

## 2020-10-21 VITALS — BP 106/68 | HR 70 | Temp 96.9°F | Resp 16 | Ht 63.0 in | Wt 143.8 lb

## 2020-10-21 DIAGNOSIS — R591 Generalized enlarged lymph nodes: Secondary | ICD-10-CM

## 2020-10-21 DIAGNOSIS — R69 Illness, unspecified: Secondary | ICD-10-CM | POA: Diagnosis not present

## 2020-10-21 DIAGNOSIS — R739 Hyperglycemia, unspecified: Secondary | ICD-10-CM | POA: Diagnosis not present

## 2020-10-21 DIAGNOSIS — I700 Atherosclerosis of aorta: Secondary | ICD-10-CM | POA: Diagnosis not present

## 2020-10-21 DIAGNOSIS — Z860100 Personal history of colon polyps, unspecified: Secondary | ICD-10-CM

## 2020-10-21 DIAGNOSIS — E039 Hypothyroidism, unspecified: Secondary | ICD-10-CM

## 2020-10-21 DIAGNOSIS — E7800 Pure hypercholesterolemia, unspecified: Secondary | ICD-10-CM | POA: Diagnosis not present

## 2020-10-21 DIAGNOSIS — F439 Reaction to severe stress, unspecified: Secondary | ICD-10-CM | POA: Diagnosis not present

## 2020-10-21 DIAGNOSIS — K2100 Gastro-esophageal reflux disease with esophagitis, without bleeding: Secondary | ICD-10-CM

## 2020-10-21 MED ORDER — ROSUVASTATIN CALCIUM 10 MG PO TABS: ORAL_TABLET | ORAL | 1 refills | Status: DC

## 2020-10-21 NOTE — Progress Notes (Signed)
Patient ID: [name], female   DOB: 30-Oct-1945, 75 y.o.   MRN: [id number]   Subjective:    Patient ID: [name], female    DOB: Jun 09, 1946, 75 y.o.   MRN: [id number]  HPI This visit occurred during the SARS-CoV-2 public health emergency.  Safety protocols were in place, including screening questions prior to the visit, additional usage of staff PPE, and extensive cleaning of exam room while observing appropriate contact time as indicated for disinfecting solutions.  Patient here for a scheduled follow up. Here to follow up regarding her cholesterol, thyroid and reflux. Increased stress - family stress. Discussed.  Taking buspar.  Overall she feels she is handling things relatively well.  No chest pain.  No sob reported.  No abdominal pain.  Bowels moving.  Tries to stay active. Back doing yoga - 2x/week. Is exercising.  Tolerating crestor.  Discussed labs.     Past Medical History:  Diagnosis Date  . Arthritis   . Environmental allergies   . Esophagitis    [place], followed by Dr Vennie Homans  . GERD (gastroesophageal reflux disease)   . Hypercholesterolemia   . Hypothyroidism    Past Surgical History:  Procedure Laterality Date  . [place]  . KNEE ARTHROSCOPY  12/03/07   left  . KNEE ARTHROSCOPY  06/25/08   left  . NASAL SINUS SURGERY  12/06  . REPLACEMENT TOTAL KNEE  01/04/09   left  . RHINOPLASTY  1986  . VAGINAL HYSTERECTOMY  1980   ovaries left in place  . WRIST SURGERY  1970   cyst removal   Family History  Problem Relation Age of Onset  . Heart disease Father        heart attack  . Diabetes Father   . Hyperlipidemia Paternal Grandmother   . Diabetes Paternal Grandmother   . Stroke Paternal Grandmother   . Hyperlipidemia Paternal Grandfather   . Diabetes Paternal Grandfather   . Prostate cancer Maternal Uncle   . Pancreatic cancer Cousin   . Multiple myeloma Brother   . Breast cancer Maternal Aunt   . Breast cancer Maternal Aunt    Social  History   Socioeconomic History  . Marital status: Married    Spouse name: Not on file  . Number of children: Not on file  . Years of education: Not on file  . Highest education level: Not on file  Occupational History  . Not on file  Tobacco Use  . Smoking status: Former Smoker    Years: 5.00    Types: Cigarettes  . Smokeless tobacco: Never Used  Vaping Use  . Vaping Use: Never used  Substance and Sexual Activity  . Alcohol use: Yes    Alcohol/week: 0.0 standard drinks    Comment: rare  . Drug use: No  . Sexual activity: Yes  Other Topics Concern  . Not on file  Social History Narrative  . Not on file   Social Determinants of Health   Financial Resource Strain: Low Risk   . Difficulty of Paying Living Expenses: Not hard at all  Food Insecurity: No Food Insecurity  . Worried About Charity fundraiser in the Last Year: Never true  . Ran Out of Food in the Last Year: Never true  Transportation Needs: No Transportation Needs  . Lack of Transportation (Medical): No  . Lack of Transportation (Non-Medical): No  Physical Activity: Insufficiently Active  . Days of Exercise per Week: 2 days  .  Minutes of Exercise per Session: 60 min  Stress: No Stress Concern Present  . Feeling of Stress : Not at all  Social Connections: Unknown  . Frequency of Communication with Friends and Family: More than three times a week  . Frequency of Social Gatherings with Friends and Family: Not on file  . Attends Religious Services: Not on file  . Active Member of Clubs or Organizations: Not on file  . Attends Archivist Meetings: Not on file  . Marital Status: Married    Outpatient Encounter Medications as of 10/21/2020  Medication Sig  . UNABLE TO FIND Take 3,326 mg by mouth 2 (two) times daily as needed. Med Name: CBD/CBG SWISH CELLULAR NUTRITION 3326 MG  . acetaminophen (TYLENOL) 500 MG tablet Take 500 mg by mouth every 6 (six) hours as needed.  Marland Kitchen acyclovir (ZOVIRAX) 800 MG  tablet TAKE 1 TABLET BY MOUTH 2 TIMES DAILY AS NEEDED.  . Ascorbic Acid (VITAMIN C) 500 MG CAPS Take 500 mg by mouth daily.  Marland Kitchen BIOTIN 5000 PO Take 10,000 mcg by mouth daily.   . budesonide (PULMICORT) 1 MG/2ML nebulizer solution ADD 1ML OF MEDICATION TO 240ML OF SALINE IN SALINE IRRIGATION BOTTLE; IRRIGATE SINUSES WITH 120ML THROUGH EACH NOSTRIL TWICE DAILY  . BUDESONIDE PO 2 (two) times daily.  . busPIRone (BUSPAR) 5 MG tablet Take 5 mg by mouth daily as needed.  . Cholecalciferol (VITAMIN D PO) Take 10,000 Units by mouth daily.   . Coenzyme Q10 (COQ-10 PO) Take 120 mg by mouth daily.  . COLLAGEN PO Take 11 mg by mouth daily.  . Cyanocobalamin (B-12 PO) Take 5,000 mcg by mouth daily.  . diclofenac sodium (VOLTAREN) 1 % GEL Apply 1 application topically as needed.  Marland Kitchen ELDERBERRY PO Take 125 mg by mouth daily.   . fluocinonide cream (LIDEX) 0.98 % Apply 1 application topically 2 (two) times daily as needed.   . Hypertonic Nasal Wash (SINUS RINSE NA) Place into the nose as needed.  Marland Kitchen LEVOFLOXACIN PO 2 (two) times daily. levofloxacin 144m/ml cash  ADD 1ML OF MEDICATION TO 240ML OF SALINE IN SALINE IRRIGATION BOTTLE; IRRIGATE  . levothyroxine (SYNTHROID) 50 MCG tablet Take 1 tablet (50 mcg total) by mouth every other day.  . levothyroxine (SYNTHROID) 75 MCG tablet Take 1 tablet (75 mcg total) by mouth every other day.  . Omega-3 Fatty Acids (FISH OIL PO) Take 1,200 mg by mouth daily.  .Marland Kitchenomeprazole (PRILOSEC) 40 MG capsule Take 1 capsule (40 mg total) by mouth daily.  . Probiotic Product (PROBIOTIC DAILY PO) Take 1 capsule by mouth.  . sodium chloride irrigation 0.9 % irrigation sodium chloride 0.9 % irrigation solution  . vitamin E 400 UNIT capsule Take 400 Units by mouth daily.  . [DISCONTINUED] folic acid (FOLVITE) 8119MCG tablet Take 800 mcg by mouth 2 (two) times daily. (Patient not taking: Reported on 10/21/2020)  . [DISCONTINUED] rosuvastatin (CRESTOR) 10 MG tablet TAKE 2 TABLETS BY MOUTH ON  MONDAY AND THURSDAY AND 1 TABLETONCE ALL OTHER DAYS.  . [DISCONTINUED] rosuvastatin (CRESTOR) 10 MG tablet TAKE 2 TABLETS BY MOUTH ON Monday, Wednesday and Friday  AND 1 TABLETONCE ALL OTHER DAYS.  . [DISCONTINUED] rosuvastatin (CRESTOR) 10 MG tablet TAKE 2 TABLETS BY MOUTH ON Monday, Wednesday and Friday  AND 1 TABLETONCE ALL OTHER DAYS.   No facility-administered encounter medications on file as of 10/21/2020.    Review of Systems  Constitutional: Negative for appetite change and unexpected weight change.  HENT: Negative  for congestion and sinus pressure.   Respiratory: Negative for cough, chest tightness and shortness of breath.   Cardiovascular: Negative for chest pain, palpitations and leg swelling.  Gastrointestinal: Negative for abdominal pain, diarrhea, nausea and vomiting.  Genitourinary: Negative for difficulty urinating and dysuria.  Musculoskeletal: Negative for joint swelling and myalgias.  Skin: Negative for color change and rash.  Neurological: Negative for dizziness, light-headedness and headaches.  Psychiatric/Behavioral: Negative for agitation and dysphoric mood.       Objective:    Physical Exam Vitals reviewed.  Constitutional:      General: She is not in acute distress.    Appearance: Normal appearance.  HENT:     Head: Normocephalic and atraumatic.     Right Ear: External ear normal.     Left Ear: External ear normal.  Eyes:     General: No scleral icterus.       Right eye: No discharge.        Left eye: No discharge.     Conjunctiva/sclera: Conjunctivae normal.  Neck:     Thyroid: No thyromegaly.  Cardiovascular:     Rate and Rhythm: Normal rate and regular rhythm.  Pulmonary:     Effort: No respiratory distress.     Breath sounds: Normal breath sounds. No wheezing.  Abdominal:     General: Bowel sounds are normal.     Palpations: Abdomen is soft.     Tenderness: There is no abdominal tenderness.  Musculoskeletal:        General: No swelling or  tenderness.     Cervical back: Neck supple. No tenderness.  Lymphadenopathy:     Cervical: No cervical adenopathy.  Skin:    Findings: No erythema or rash.  Neurological:     Mental Status: She is alert.  Psychiatric:        Mood and Affect: Mood normal.        Behavior: Behavior normal.     BP 106/68   Pulse 70   Temp (!) 96.9 F (36.1 C)   Resp 16   Ht 5' 3"  (1.6 m)   Wt 143 lb 12.8 oz (65.2 kg)   SpO2 97%   BMI 25.47 kg/m  Wt Readings from Last 3 Encounters:  10/21/20 143 lb 12.8 oz (65.2 kg)  06/18/20 144 lb (65.3 kg)  04/07/20 142 lb 6.4 oz (64.6 kg)     Lab Results  Component Value Date   WBC 4.5 10/19/2020   HGB 13.3 10/19/2020   HCT 38.3 10/19/2020   PLT 226.0 10/19/2020   GLUCOSE 94 10/19/2020   CHOL 180 10/19/2020   TRIG 150.0 (H) 10/19/2020   HDL 58.80 10/19/2020   LDLDIRECT 123.4 02/27/2013   LDLCALC 91 10/19/2020   ALT 20 10/19/2020   AST 23 10/19/2020   NA 142 10/19/2020   K 4.1 10/19/2020   CL 104 10/19/2020   CREATININE 0.60 10/19/2020   BUN 13 10/19/2020   CO2 31 10/19/2020   TSH 1.87 10/19/2020   HGBA1C 5.6 10/19/2020    MM 3D SCREEN BREAST BILATERAL  Result Date: 04/01/2020 CLINICAL DATA:  Screening. EXAM: DIGITAL SCREENING BILATERAL MAMMOGRAM WITH TOMO AND CAD COMPARISON:  Previous exam(s). ACR Breast Density Category c: The breast tissue is heterogeneously dense, which may obscure small masses. FINDINGS: There are no findings suspicious for malignancy. Images were processed with CAD. IMPRESSION: No mammographic evidence of malignancy. A result letter of this screening mammogram will be mailed directly to the patient. RECOMMENDATION: Screening mammogram in  one year. (Code:SM-B-01Y) BI-RADS CATEGORY  1: Negative. Electronically Signed   By: Ammie Ferrier M.D.   On: 04/01/2020 08:02       Assessment & Plan:   Problem List Items Addressed This Visit    Aortic atherosclerosis ([place])    Continue crestor.  Increase dose as outlined.         GERD (gastroesophageal reflux disease)    S/p EGD 11/2019.  Symptoms controlled on prilosec.       History of colonic polyps    Colonoscopy 01/2018.  Per pt, f/u colonoscopy recommended in 5 years.       Hypercholesteremia - Primary    On crestor.  Low cholesterol diet and exercise.  Follow lipid panel and liver function tests.        Relevant Orders   Lipid panel   Hepatic function panel   Basic metabolic panel   Hyperglycemia    Low carb diet and exercise.  Follow met b and a1c.       Relevant Orders   Hemoglobin A1c   Hypothyroidism    On thyroid replacement.  Follow tsh. Recent tsh (10/19/20) - wnl.       Lymphadenopathy    Seeing ENT.  Has f/u with Dr Pryor Ochoa scheduled soon.        Stress    Increased stress. Discussed today. She does have buspar and is taking.  Feels is helping. Does not feel needs any further intervention at this time.  Discussed continuing buspar.            Einar Pheasant, MD

## 2020-10-21 NOTE — Assessment & Plan Note (Signed)
Continue crestor.  Increase dose as outlined.

## 2020-10-26 ENCOUNTER — Telehealth: Payer: Self-pay | Admitting: Internal Medicine

## 2020-10-26 MED ORDER — ROSUVASTATIN CALCIUM 10 MG PO TABS: ORAL_TABLET | ORAL | 1 refills | Status: DC

## 2020-10-26 NOTE — Telephone Encounter (Signed)
PT called in to request a refill to be sent in to [facility] on file for rosuvastatin (CRESTOR) 10 MG tablet.

## 2020-10-31 ENCOUNTER — Encounter: Payer: Self-pay | Admitting: Internal Medicine

## 2020-10-31 NOTE — Assessment & Plan Note (Addendum)
Colonoscopy 01/2018.  Per pt, f/u colonoscopy recommended in 5 years.  

## 2020-10-31 NOTE — Assessment & Plan Note (Signed)
Increased stress. Discussed today. She does have buspar and is taking.  Feels is helping. Does not feel needs any further intervention at this time.  Discussed continuing buspar.

## 2020-10-31 NOTE — Assessment & Plan Note (Signed)
Seeing ENT.  Has f/u with Dr Pryor Ochoa scheduled soon.

## 2020-10-31 NOTE — Assessment & Plan Note (Signed)
S/p EGD 11/2019.  Symptoms controlled on prilosec.

## 2020-10-31 NOTE — Assessment & Plan Note (Signed)
Low carb diet and exercise.  Follow met b and a1c.  

## 2020-10-31 NOTE — Assessment & Plan Note (Signed)
On crestor.  Low cholesterol diet and exercise.  Follow lipid panel and liver function tests.   

## 2020-10-31 NOTE — Assessment & Plan Note (Addendum)
On thyroid replacement.  Follow tsh. Recent tsh (10/19/20) - wnl.  

## 2020-11-04 NOTE — Telephone Encounter (Signed)
I called patient to reschedule her AWV.  Patient said the insurance won't pay for rx the way it was written.  Patient wants to know if the rx can be changed to 20 mg for 90 days and she'll cut the pills in half on the days she takes 10 mg. Please send rx to [facility]. Patient thinks she has enough medication until she receives it through the mail.

## 2020-11-05 ENCOUNTER — Other Ambulatory Visit: Payer: Self-pay

## 2020-11-05 MED ORDER — ROSUVASTATIN CALCIUM 10 MG PO TABS: ORAL_TABLET | ORAL | 1 refills | Status: DC

## 2020-11-05 NOTE — Telephone Encounter (Signed)
Rx sent to pharmacy   

## 2020-11-06 ENCOUNTER — Telehealth: Payer: Self-pay | Admitting: Internal Medicine

## 2020-11-11 ENCOUNTER — Other Ambulatory Visit: Payer: Self-pay

## 2020-11-11 DIAGNOSIS — M9901 Segmental and somatic dysfunction of cervical region: Secondary | ICD-10-CM | POA: Diagnosis not present

## 2020-11-11 DIAGNOSIS — M5412 Radiculopathy, cervical region: Secondary | ICD-10-CM | POA: Diagnosis not present

## 2020-11-11 DIAGNOSIS — M5033 Other cervical disc degeneration, cervicothoracic region: Secondary | ICD-10-CM | POA: Diagnosis not present

## 2020-11-11 DIAGNOSIS — M9903 Segmental and somatic dysfunction of lumbar region: Secondary | ICD-10-CM | POA: Diagnosis not present

## 2020-11-11 MED ORDER — ROSUVASTATIN CALCIUM 20 MG PO TABS: ORAL_TABLET | ORAL | 2 refills | Status: DC

## 2020-11-11 NOTE — Telephone Encounter (Signed)
Called [facility], her insurance will not pay for more than 1 tablet per day. Sent in rosuvastatin 20 mg per day with updated directions. Pt is aware

## 2020-11-11 NOTE — Telephone Encounter (Signed)
Pt came in and states that her crestor medication is written in a way that the insurance will not cover it. Pt does not have many pills left. Please call pt to clarify. She is frustrated that this happened last time. Please advise

## 2020-11-11 NOTE — Telephone Encounter (Signed)
LMTCB

## 2020-11-16 DIAGNOSIS — R591 Generalized enlarged lymph nodes: Secondary | ICD-10-CM | POA: Diagnosis not present

## 2020-11-16 DIAGNOSIS — H6063 Unspecified chronic otitis externa, bilateral: Secondary | ICD-10-CM | POA: Diagnosis not present

## 2020-11-16 DIAGNOSIS — J324 Chronic pansinusitis: Secondary | ICD-10-CM | POA: Diagnosis not present

## 2020-11-16 DIAGNOSIS — H6121 Impacted cerumen, right ear: Secondary | ICD-10-CM | POA: Diagnosis not present

## 2020-11-19 DIAGNOSIS — H2513 Age-related nuclear cataract, bilateral: Secondary | ICD-10-CM | POA: Diagnosis not present

## 2020-11-19 DIAGNOSIS — H0288A Meibomian gland dysfunction right eye, upper and lower eyelids: Secondary | ICD-10-CM | POA: Diagnosis not present

## 2020-11-19 DIAGNOSIS — H0288B Meibomian gland dysfunction left eye, upper and lower eyelids: Secondary | ICD-10-CM | POA: Diagnosis not present

## 2020-11-19 DIAGNOSIS — H04123 Dry eye syndrome of bilateral lacrimal glands: Secondary | ICD-10-CM | POA: Diagnosis not present

## 2020-11-19 DIAGNOSIS — H5203 Hypermetropia, bilateral: Secondary | ICD-10-CM | POA: Diagnosis not present

## 2020-11-24 DIAGNOSIS — M9903 Segmental and somatic dysfunction of lumbar region: Secondary | ICD-10-CM | POA: Diagnosis not present

## 2020-11-24 DIAGNOSIS — M5033 Other cervical disc degeneration, cervicothoracic region: Secondary | ICD-10-CM | POA: Diagnosis not present

## 2020-11-24 DIAGNOSIS — M9901 Segmental and somatic dysfunction of cervical region: Secondary | ICD-10-CM | POA: Diagnosis not present

## 2020-11-24 DIAGNOSIS — M5412 Radiculopathy, cervical region: Secondary | ICD-10-CM | POA: Diagnosis not present

## 2020-11-26 DIAGNOSIS — M19072 Primary osteoarthritis, left ankle and foot: Secondary | ICD-10-CM | POA: Diagnosis not present

## 2020-11-26 DIAGNOSIS — M19071 Primary osteoarthritis, right ankle and foot: Secondary | ICD-10-CM | POA: Diagnosis not present

## 2021-01-12 DIAGNOSIS — M9901 Segmental and somatic dysfunction of cervical region: Secondary | ICD-10-CM | POA: Diagnosis not present

## 2021-01-12 DIAGNOSIS — M9903 Segmental and somatic dysfunction of lumbar region: Secondary | ICD-10-CM | POA: Diagnosis not present

## 2021-01-12 DIAGNOSIS — M5033 Other cervical disc degeneration, cervicothoracic region: Secondary | ICD-10-CM | POA: Diagnosis not present

## 2021-01-12 DIAGNOSIS — M5412 Radiculopathy, cervical region: Secondary | ICD-10-CM | POA: Diagnosis not present

## 2021-01-27 DIAGNOSIS — M5033 Other cervical disc degeneration, cervicothoracic region: Secondary | ICD-10-CM | POA: Diagnosis not present

## 2021-01-27 DIAGNOSIS — M9903 Segmental and somatic dysfunction of lumbar region: Secondary | ICD-10-CM | POA: Diagnosis not present

## 2021-01-27 DIAGNOSIS — M5412 Radiculopathy, cervical region: Secondary | ICD-10-CM | POA: Diagnosis not present

## 2021-01-27 DIAGNOSIS — M9901 Segmental and somatic dysfunction of cervical region: Secondary | ICD-10-CM | POA: Diagnosis not present

## 2021-02-17 ENCOUNTER — Other Ambulatory Visit: Payer: Self-pay

## 2021-02-17 ENCOUNTER — Other Ambulatory Visit (INDEPENDENT_AMBULATORY_CARE_PROVIDER_SITE_OTHER): Payer: Medicare HMO

## 2021-02-17 DIAGNOSIS — E7800 Pure hypercholesterolemia, unspecified: Secondary | ICD-10-CM | POA: Diagnosis not present

## 2021-02-17 DIAGNOSIS — M5412 Radiculopathy, cervical region: Secondary | ICD-10-CM | POA: Diagnosis not present

## 2021-02-17 DIAGNOSIS — R739 Hyperglycemia, unspecified: Secondary | ICD-10-CM | POA: Diagnosis not present

## 2021-02-17 DIAGNOSIS — M9903 Segmental and somatic dysfunction of lumbar region: Secondary | ICD-10-CM | POA: Diagnosis not present

## 2021-02-17 DIAGNOSIS — M9901 Segmental and somatic dysfunction of cervical region: Secondary | ICD-10-CM | POA: Diagnosis not present

## 2021-02-17 DIAGNOSIS — M5033 Other cervical disc degeneration, cervicothoracic region: Secondary | ICD-10-CM | POA: Diagnosis not present

## 2021-02-17 LAB — BASIC METABOLIC PANEL WITH GFR
BUN: 16 mg/dL (ref 6–23)
CO2: 28 meq/L (ref 19–32)
Calcium: 10 mg/dL (ref 8.4–10.5)
Chloride: 105 meq/L (ref 96–112)
Creatinine, Ser: 0.65 mg/dL (ref 0.40–1.20)
GFR: 85.81 mL/min
Glucose, Bld: 97 mg/dL (ref 70–99)
Potassium: 4.3 meq/L (ref 3.5–5.1)
Sodium: 141 meq/L (ref 135–145)

## 2021-02-17 LAB — HEPATIC FUNCTION PANEL
ALT: 17 U/L (ref 0–35)
AST: 20 U/L (ref 0–37)
Albumin: 4.6 g/dL (ref 3.5–5.2)
Alkaline Phosphatase: 59 U/L (ref 39–117)
Bilirubin, Direct: 0.1 mg/dL (ref 0.0–0.3)
Total Bilirubin: 0.7 mg/dL (ref 0.2–1.2)
Total Protein: 7 g/dL (ref 6.0–8.3)

## 2021-02-17 LAB — LIPID PANEL
Cholesterol: 163 mg/dL (ref 0–200)
HDL: 60.9 mg/dL
LDL Cholesterol: 84 mg/dL (ref 0–99)
NonHDL: 101.63
Total CHOL/HDL Ratio: 3
Triglycerides: 90 mg/dL (ref 0.0–149.0)
VLDL: 18 mg/dL (ref 0.0–40.0)

## 2021-02-17 LAB — HEMOGLOBIN A1C: Hgb A1c MFr Bld: 5.7 % (ref 4.6–6.5)

## 2021-02-18 ENCOUNTER — Other Ambulatory Visit: Payer: Self-pay | Admitting: Internal Medicine

## 2021-02-18 MED ORDER — ACYCLOVIR 800 MG PO TABS: 800.0000 mg | ORAL_TABLET | Freq: Two times a day (BID) | ORAL | 0 refills | Status: DC

## 2021-02-19 DIAGNOSIS — L570 Actinic keratosis: Secondary | ICD-10-CM | POA: Diagnosis not present

## 2021-02-19 DIAGNOSIS — L309 Dermatitis, unspecified: Secondary | ICD-10-CM | POA: Diagnosis not present

## 2021-02-19 DIAGNOSIS — L820 Inflamed seborrheic keratosis: Secondary | ICD-10-CM | POA: Diagnosis not present

## 2021-02-22 ENCOUNTER — Other Ambulatory Visit: Payer: Self-pay

## 2021-02-22 ENCOUNTER — Ambulatory Visit (INDEPENDENT_AMBULATORY_CARE_PROVIDER_SITE_OTHER): Payer: Medicare HMO | Admitting: Internal Medicine

## 2021-02-22 ENCOUNTER — Ambulatory Visit (INDEPENDENT_AMBULATORY_CARE_PROVIDER_SITE_OTHER): Payer: Medicare HMO

## 2021-02-22 VITALS — BP 126/64 | HR 73 | Temp 97.6°F | Ht 63.0 in | Wt 141.8 lb

## 2021-02-22 VITALS — Ht 63.0 in | Wt 143.0 lb

## 2021-02-22 DIAGNOSIS — Z860100 Personal history of colon polyps, unspecified: Secondary | ICD-10-CM

## 2021-02-22 DIAGNOSIS — R739 Hyperglycemia, unspecified: Secondary | ICD-10-CM

## 2021-02-22 DIAGNOSIS — E039 Hypothyroidism, unspecified: Secondary | ICD-10-CM | POA: Diagnosis not present

## 2021-02-22 DIAGNOSIS — R0989 Other specified symptoms and signs involving the circulatory and respiratory systems: Secondary | ICD-10-CM | POA: Diagnosis not present

## 2021-02-22 DIAGNOSIS — F439 Reaction to severe stress, unspecified: Secondary | ICD-10-CM | POA: Diagnosis not present

## 2021-02-22 DIAGNOSIS — E7800 Pure hypercholesterolemia, unspecified: Secondary | ICD-10-CM | POA: Diagnosis not present

## 2021-02-22 DIAGNOSIS — R69 Illness, unspecified: Secondary | ICD-10-CM | POA: Diagnosis not present

## 2021-02-22 DIAGNOSIS — K2100 Gastro-esophageal reflux disease with esophagitis, without bleeding: Secondary | ICD-10-CM | POA: Diagnosis not present

## 2021-02-22 DIAGNOSIS — Z0000 Encounter for general adult medical examination without abnormal findings: Secondary | ICD-10-CM | POA: Diagnosis not present

## 2021-02-22 DIAGNOSIS — I700 Atherosclerosis of aorta: Secondary | ICD-10-CM

## 2021-02-22 DIAGNOSIS — Z23 Encounter for immunization: Secondary | ICD-10-CM | POA: Diagnosis not present

## 2021-02-22 DIAGNOSIS — E2839 Other primary ovarian failure: Secondary | ICD-10-CM

## 2021-02-22 DIAGNOSIS — Z1231 Encounter for screening mammogram for malignant neoplasm of breast: Secondary | ICD-10-CM

## 2021-02-22 DIAGNOSIS — Z780 Asymptomatic menopausal state: Secondary | ICD-10-CM

## 2021-02-22 NOTE — Progress Notes (Addendum)
Patient ID: [name], female   DOB: [birth year], 75 y.o.   MRN: [id number]   Subjective:    Patient ID: [name], female    DOB: 28-Mar-1946, 75 y.o.   MRN: [id number]  This visit occurred during the SARS-CoV-2 public health emergency.  Safety protocols were in place, including screening questions prior to the visit, additional usage of staff PPE, and extensive cleaning of exam room while observing appropriate contact time as indicated for disinfecting solutions.   Patient here for a scheduled follow up.   Chief Complaint  Patient presents with   Follow-up   .   HPI She is here to follow up regarding her cholesterol and reflux.  Also with increased stress.  Discussed.  Has buspar that she takes prn.  Does not feel needs any further intervention at this time.  Tries to stay active.  No chest pain or sob reported.  No abdominal pain.  Bowels moving.  Had questions about her crestor rx.    Past Medical History:  Diagnosis Date   Arthritis    Environmental allergies    Esophagitis    [place], followed by Dr Vennie Homans   GERD (gastroesophageal reflux disease)    Hypercholesterolemia    Hypothyroidism    Past Surgical History:  Procedure Laterality Date   ANKLE SURGERY  1967   KNEE ARTHROSCOPY  12/03/07   left   KNEE ARTHROSCOPY  06/25/08   left   NASAL SINUS SURGERY  12/06   REPLACEMENT TOTAL KNEE  01/04/09   left   RHINOPLASTY  1986   VAGINAL HYSTERECTOMY  1980   ovaries left in place   WRIST SURGERY  1970   cyst removal   Family History  Problem Relation Age of Onset   Heart disease Father        heart attack   Diabetes Father    Hyperlipidemia Paternal Grandmother    Diabetes Paternal Grandmother    Stroke Paternal Grandmother    Hyperlipidemia Paternal Grandfather    Diabetes Paternal Grandfather    Prostate cancer Maternal Uncle    Pancreatic cancer Cousin    Multiple myeloma Brother    Breast cancer Maternal Aunt    Breast cancer Maternal Aunt     Social History   Socioeconomic History   Marital status: Married    Spouse name: Not on file   Number of children: Not on file   Years of education: Not on file   Highest education level: Not on file  Occupational History   Not on file  Tobacco Use   Smoking status: Former    Years: 5.00    Types: Cigarettes   Smokeless tobacco: Never  Vaping Use   Vaping Use: Never used  Substance and Sexual Activity   Alcohol use: Yes    Alcohol/week: 0.0 standard drinks    Comment: rare   Drug use: No   Sexual activity: Yes  Other Topics Concern   Not on file  Social History Narrative   Not on file   Social Determinants of Health   Financial Resource Strain: Low Risk    Difficulty of Paying Living Expenses: Not hard at all  Food Insecurity: No Food Insecurity   Worried About Charity fundraiser in the Last Year: Never true   [place] in the Last Year: Never true  Transportation Needs: No Transportation Needs   Lack of Transportation (Medical): No   Lack of Transportation (Non-Medical): No  Physical  Activity: Insufficiently Active   Days of Exercise per Week: 2 days   Minutes of Exercise per Session: 60 min  Stress: No Stress Concern Present   Feeling of Stress : Not at all  Social Connections: Unknown   Frequency of Communication with Friends and Family: More than three times a week   Frequency of Social Gatherings with Friends and Family: Not on file   Attends Religious Services: Not on file   Active Member of Clubs or Organizations: Not on file   Attends Archivist Meetings: Not on file   Marital Status: Married    Review of Systems  Constitutional:  Negative for appetite change and unexpected weight change.  HENT:  Negative for congestion and sinus pressure.   Respiratory:  Negative for cough, chest tightness and shortness of breath.   Cardiovascular:  Negative for chest pain, palpitations and leg swelling.  Gastrointestinal:  Negative for abdominal  pain, diarrhea, nausea and vomiting.  Genitourinary:  Negative for difficulty urinating and dysuria.  Musculoskeletal:  Negative for joint swelling and myalgias.  Skin:  Negative for color change and rash.  Neurological:  Negative for dizziness, light-headedness and headaches.  Psychiatric/Behavioral:  Negative for agitation and dysphoric mood.       Objective:     BP 126/64   Pulse 73   Temp 97.6 F (36.4 C) (Skin)   Ht 5' 3"  (1.6 m)   Wt 141 lb 12.8 oz (64.3 kg)   SpO2 97%   BMI 25.12 kg/m  Wt Readings from Last 3 Encounters:  02/22/21 141 lb 12.8 oz (64.3 kg)  02/22/21 143 lb (64.9 kg)  10/21/20 143 lb 12.8 oz (65.2 kg)    Physical Exam Vitals reviewed.  Constitutional:      General: She is not in acute distress.    Appearance: Normal appearance.  HENT:     Head: Normocephalic and atraumatic.     Right Ear: External ear normal.     Left Ear: External ear normal.  Eyes:     General: No scleral icterus.       Right eye: No discharge.        Left eye: No discharge.     Conjunctiva/sclera: Conjunctivae normal.  Neck:     Thyroid: No thyromegaly.     Comments: Left carotid bruit Cardiovascular:     Rate and Rhythm: Normal rate and regular rhythm.  Pulmonary:     Effort: No respiratory distress.     Breath sounds: Normal breath sounds. No wheezing.  Abdominal:     General: Bowel sounds are normal.     Palpations: Abdomen is soft.     Tenderness: There is no abdominal tenderness.  Musculoskeletal:        General: No swelling or tenderness.     Cervical back: Neck supple. No tenderness.  Lymphadenopathy:     Cervical: No cervical adenopathy.  Skin:    Findings: No erythema or rash.  Neurological:     Mental Status: She is alert.  Psychiatric:        Mood and Affect: Mood normal.        Behavior: Behavior normal.     Outpatient Encounter Medications as of 02/22/2021  Medication Sig   acetaminophen (TYLENOL) 500 MG tablet Take 500 mg by mouth every 6 (six)  hours as needed.   acyclovir (ZOVIRAX) 800 MG tablet Take 1 tablet (800 mg total) by mouth 2 (two) times daily.   Ascorbic Acid (VITAMIN C) 500 MG  CAPS Take 500 mg by mouth daily.   BIOTIN 5000 PO Take 10,000 mcg by mouth daily.    budesonide (PULMICORT) 1 MG/2ML nebulizer solution ADD 1ML OF MEDICATION TO 240ML OF SALINE IN SALINE IRRIGATION BOTTLE; IRRIGATE SINUSES WITH 120ML THROUGH EACH NOSTRIL TWICE DAILY   BUDESONIDE PO 2 (two) times daily.   busPIRone (BUSPAR) 5 MG tablet Take 5 mg by mouth daily as needed.   Cholecalciferol (VITAMIN D PO) Take 10,000 Units by mouth daily.    Coenzyme Q10 (COQ-10 PO) Take 120 mg by mouth daily.   COLLAGEN PO Take 11 mg by mouth daily.   Cyanocobalamin (B-12 PO) Take 5,000 mcg by mouth daily.   diclofenac sodium (VOLTAREN) 1 % GEL Apply 1 application topically as needed.   ELDERBERRY PO Take 125 mg by mouth daily.    fluocinonide cream (LIDEX) 9.83 % Apply 1 application topically 2 (two) times daily as needed.    Hypertonic Nasal Wash (SINUS RINSE NA) Place into the nose as needed.   LEVOFLOXACIN PO 2 (two) times daily. levofloxacin 151m/ml cash  ADD 1ML OF MEDICATION TO 240ML OF SALINE IN SALINE IRRIGATION BOTTLE; IRRIGATE   levothyroxine (SYNTHROID) 50 MCG tablet Take 1 tablet (50 mcg total) by mouth every other day.   levothyroxine (SYNTHROID) 75 MCG tablet Take 1 tablet (75 mcg total) by mouth every other day.   NON FORMULARY DE 3 Omega Benefits TID   Omega-3 Fatty Acids (FISH OIL PO) Take 1,200 mg by mouth daily.   omeprazole (PRILOSEC) 40 MG capsule Take 1 capsule (40 mg total) by mouth daily.   Probiotic Product (PROBIOTIC DAILY PO) Take 1 capsule by mouth.   rosuvastatin (CRESTOR) 20 MG tablet Take 2107mby mouth daily on Monday, Wednesday and Friday and 10 mg by mouth all other days.   sodium chloride irrigation 0.9 % irrigation sodium chloride 0.9 % irrigation solution   UNABLE TO FIND Take 3,326 mg by mouth 2 (two) times daily as needed. Med  Name: CBD/CBG SWISH CELLULAR NUTRITION 3326 MG   vitamin E 400 UNIT capsule Take 400 Units by mouth daily.   No facility-administered encounter medications on file as of 02/22/2021.     Lab Results  Component Value Date   WBC 4.5 10/19/2020   HGB 13.3 10/19/2020   HCT 38.3 10/19/2020   PLT 226.0 10/19/2020   GLUCOSE 97 02/17/2021   CHOL 163 02/17/2021   TRIG 90.0 02/17/2021   HDL 60.90 02/17/2021   LDLDIRECT 123.4 02/27/2013   LDLCALC 84 02/17/2021   ALT 17 02/17/2021   AST 20 02/17/2021   NA 141 02/17/2021   K 4.3 02/17/2021   CL 105 02/17/2021   CREATININE 0.65 02/17/2021   BUN 16 02/17/2021   CO2 28 02/17/2021   TSH 1.87 10/19/2020   HGBA1C 5.7 02/17/2021    MM 3D SCREEN BREAST BILATERAL  Result Date: 04/01/2020 CLINICAL DATA:  Screening. EXAM: DIGITAL SCREENING BILATERAL MAMMOGRAM WITH TOMO AND CAD COMPARISON:  Previous exam(s). ACR Breast Density Category c: The breast tissue is heterogeneously dense, which may obscure small masses. FINDINGS: There are no findings suspicious for malignancy. Images were processed with CAD. IMPRESSION: No mammographic evidence of malignancy. A result letter of this screening mammogram will be mailed directly to the patient. RECOMMENDATION: Screening mammogram in one year. (Code:SM-B-01Y) BI-RADS CATEGORY  1: Negative. Electronically Signed   By: MiAmmie Ferrier.D.   On: 04/01/2020 08:02       Assessment & Plan:   Problem  List Items Addressed This Visit     Aortic atherosclerosis ([place])    Continue crestor.       GERD (gastroesophageal reflux disease)    S/p EGD 11/2019.  Symptoms controlled on prilosec.       History of colonic polyps    Colonoscopy 01/2018.  Per pt, f/u colonoscopy recommended in 5 years.       Hypercholesteremia    On crestor.  Low cholesterol diet and exercise.  Follow lipid panel and liver function tests.        Relevant Orders   Lipid panel   Basic metabolic panel   Hepatic function panel    Hyperglycemia    Low carb diet and exercise.  Follow met b and a1c.       Relevant Orders   Hemoglobin A1c   Hypothyroidism    On thyroid replacement.  Follow tsh. Recent tsh (10/19/20) - wnl.       Left carotid bruit    Schedule carotid ultrasound.        Relevant Orders   VAS US CAROTID   Stress    Increased stress as outlined.  Discussed.  Has buspar if needed.  Does not feel needs any further intervention at this time.  Follow.       Other Visit Diagnoses     Need for immunization against influenza    -  Primary   Relevant Orders   Flu Vaccine QUAD High Dose(Fluad) (Completed)   Encounter for screening mammogram for malignant neoplasm of breast       Relevant Orders   MM 3D SCREEN BREAST BILATERAL   Estrogen deficiency       Relevant Orders   DG Bone Density        Einar Pheasant, MD

## 2021-02-22 NOTE — Progress Notes (Signed)
Pre visit review using our clinic review tool, if applicable. No additional management support is needed unless otherwise documented below in the visit note. 

## 2021-02-22 NOTE — Progress Notes (Signed)
Subjective:   [name] is a 75 y.o. female who presents for Medicare Annual (Subsequent) preventive examination.  Review of Systems    No ROS.  Medicare Wellness Virtual Visit.  Visual/audio telehealth visit, UTA vital signs.   See social history for additional risk factors.   Cardiac Risk Factors include: advanced age (>42mn, >>22women)     Objective:    Today's Vitals   02/22/21 1036  Weight: 143 lb (64.9 kg)  Height: 5' 3"  (1.6 m)   Body mass index is 25.33 kg/m.  Advanced Directives 02/22/2021 02/20/2020 02/19/2019 09/18/2017 08/02/2016 05/27/2015  Does Patient Have a Medical Advance Directive? Yes Yes Yes Yes Yes Yes  Type of AParamed[place]of [place] Living will;Healthcare Power of [place] will Living will;Healthcare Power of [place] Lakes[place] will  Does patient want to make changes to medical advance directive? No - Patient declined No - Patient declined No - Patient declined No - Patient declined No - Patient declined No - Patient declined  Copy of [place] Chart? Yes - validated most recent copy scanned in chart (See row information) No - copy requested Yes - validated most recent copy scanned in chart (See row information) No - copy requested No - copy requested No - copy requested    Current Medications (verified) Outpatient Encounter Medications as of 02/22/2021  Medication Sig   acetaminophen (TYLENOL) 500 MG tablet Take 500 mg by mouth every 6 (six) hours as needed.   acyclovir (ZOVIRAX) 800 MG tablet Take 1 tablet (800 mg total) by mouth 2 (two) times daily.   Ascorbic Acid (VITAMIN C) 500 MG CAPS Take 500 mg by mouth daily.   BIOTIN 5000 PO Take 10,000 mcg by mouth daily.    budesonide (PULMICORT) 1 MG/2ML nebulizer solution ADD 1ML OF MEDICATION TO 240ML OF SALINE IN SALINE IRRIGATION BOTTLE; IRRIGATE  SINUSES WITH 120ML THROUGH EACH NOSTRIL TWICE DAILY   BUDESONIDE PO 2 (two) times daily.   busPIRone (BUSPAR) 5 MG tablet Take 5 mg by mouth daily as needed.   Cholecalciferol (VITAMIN D PO) Take 10,000 Units by mouth daily.    Coenzyme Q10 (COQ-10 PO) Take 120 mg by mouth daily.   COLLAGEN PO Take 11 mg by mouth daily.   Cyanocobalamin (B-12 PO) Take 5,000 mcg by mouth daily.   diclofenac sodium (VOLTAREN) 1 % GEL Apply 1 application topically as needed.   ELDERBERRY PO Take 125 mg by mouth daily.    fluocinonide cream (LIDEX) 07.84% Apply 1 application topically 2 (two) times daily as needed.    Hypertonic Nasal Wash (SINUS RINSE NA) Place into the nose as needed.   LEVOFLOXACIN PO 2 (two) times daily. levofloxacin 1072mml cash  ADD 1ML OF MEDICATION TO 240ML OF SALINE IN SALINE IRRIGATION BOTTLE; IRRIGATE   levothyroxine (SYNTHROID) 50 MCG tablet Take 1 tablet (50 mcg total) by mouth every other day.   levothyroxine (SYNTHROID) 75 MCG tablet Take 1 tablet (75 mcg total) by mouth every other day.   Omega-3 Fatty Acids (FISH OIL PO) Take 1,200 mg by mouth daily.   omeprazole (PRILOSEC) 40 MG capsule Take 1 capsule (40 mg total) by mouth daily.   Probiotic Product (PROBIOTIC DAILY PO) Take 1 capsule by mouth.   rosuvastatin (CRESTOR) 20 MG tablet Take 2048my mouth daily on Monday, Wednesday and Friday and 10 mg by mouth all other days.  sodium chloride irrigation 0.9 % irrigation sodium chloride 0.9 % irrigation solution   UNABLE TO FIND Take 3,326 mg by mouth 2 (two) times daily as needed. Med Name: CBD/CBG SWISH CELLULAR NUTRITION 3326 MG   vitamin E 400 UNIT capsule Take 400 Units by mouth daily.   No facility-administered encounter medications on file as of 02/22/2021.    Allergies (verified) Diclofenac, Ibuprofen, Tramadol, Hydromorphone, and Oxycodone   History: Past Medical History:  Diagnosis Date   Arthritis    Environmental allergies    Esophagitis    [place], followed  by Dr Vennie Homans   GERD (gastroesophageal reflux disease)    Hypercholesterolemia    Hypothyroidism    Past Surgical History:  Procedure Laterality Date   ANKLE SURGERY  1967   KNEE ARTHROSCOPY  12/03/07   left   KNEE ARTHROSCOPY  06/25/08   left   NASAL SINUS SURGERY  12/06   REPLACEMENT TOTAL KNEE  01/04/09   left   RHINOPLASTY  1986   VAGINAL HYSTERECTOMY  1980   ovaries left in place   WRIST SURGERY  1970   cyst removal   Family History  Problem Relation Age of Onset   Heart disease Father        heart attack   Diabetes Father    Hyperlipidemia Paternal Grandmother    Diabetes Paternal Grandmother    Stroke Paternal Grandmother    Hyperlipidemia Paternal Grandfather    Diabetes Paternal Grandfather    Prostate cancer Maternal Uncle    Pancreatic cancer Cousin    Multiple myeloma Brother    Breast cancer Maternal Aunt    Breast cancer Maternal Aunt    Social History   Socioeconomic History   Marital status: Married    Spouse name: Not on file   Number of children: Not on file   Years of education: Not on file   Highest education level: Not on file  Occupational History   Not on file  Tobacco Use   Smoking status: Former    Years: 5.00    Types: Cigarettes   Smokeless tobacco: Never  Vaping Use   Vaping Use: Never used  Substance and Sexual Activity   Alcohol use: Yes    Alcohol/week: 0.0 standard drinks    Comment: rare   Drug use: No   Sexual activity: Yes  Other Topics Concern   Not on file  Social History Narrative   Not on file   Social Determinants of Health   Financial Resource Strain: Low Risk    Difficulty of Paying Living Expenses: Not hard at all  Food Insecurity: No Food Insecurity   Worried About Charity fundraiser in the Last Year: Never true   [place] in the Last Year: Never true  Transportation Needs: No Transportation Needs   Lack of Transportation (Medical): No   Lack of Transportation (Non-Medical): No  Physical  Activity: Insufficiently Active   Days of Exercise per Week: 2 days   Minutes of Exercise per Session: 60 min  Stress: No Stress Concern Present   Feeling of Stress : Not at all  Social Connections: Unknown   Frequency of Communication with Friends and Family: More than three times a week   Frequency of Social Gatherings with Friends and Family: Not on file   Attends Religious Services: Not on file   Active Member of Clubs or Organizations: Not on file   Attends Archivist Meetings: Not on file  Marital Status: Married    Tobacco Counseling Counseling given: Not Answered   Clinical Intake:  Pre-visit preparation completed: Yes        Diabetes: No  How often do you need to have someone help you when you read instructions, pamphlets, or other written materials from your doctor or pharmacy?: 1 - Never  Interpreter Needed?: No      Activities of Daily Living In your present state of health, do you have any difficulty performing the following activities: 02/22/2021  Hearing? N  Vision? N  Difficulty concentrating or making decisions? N  Walking or climbing stairs? N  Dressing or bathing? N  Doing errands, shopping? N  Preparing Food and eating ? N  Using the Toilet? N  In the past six months, have you accidently leaked urine? N  Do you have problems with loss of bowel control? N  Managing your Medications? N  Managing your Finances? N  Housekeeping or managing your Housekeeping? N  Some recent data might be hidden    Patient Care Team: Einar Pheasant, MD as PCP - General (Internal Medicine)  Indicate any recent Medical Services you may have received from other than [facility] providers in the past year (date may be approximate).     Assessment:   This is a routine wellness examination for [place].  I connected with Solae today by telephone and verified that I am speaking with the correct person using two identifiers. Location patient: home Location  provider: work Persons participating in the virtual visit: patient, Marine scientist.    I discussed the limitations, risks, security and privacy concerns of performing an evaluation and management service by telephone and the availability of in person appointments. The patient expressed understanding and verbally consented to this telephonic visit.    Interactive audio and video telecommunications were attempted between this provider and patient, however failed, due to patient having technical difficulties OR patient did not have access to video capability.  We continued and completed visit with audio only.  Some vital signs may be absent or patient reported.   Hearing/Vision screen Hearing Screening - Comments:: Patient is able to hear conversational tones without difficulty.  No issues reported. Vision Screening - Comments:: Follow[facility] by [facility]  Wears corrective lenses  They have regular follow up with the ophthalmologist  Dietary issues and exercise activities discussed: Current Exercise Habits: Home exercise routine, Type of exercise: yoga, Time (Minutes): 60, Frequency (Times/Week): 2, Weekly Exercise (Minutes/Week): 120, Intensity: Mild Low carb diet Good water intake   Goals Addressed             This Visit's Progress    Reduce sugar intake         Depression Screen PHQ 2/9 Scores 02/22/2021 02/20/2020 02/19/2019 09/18/2017 06/19/2017 12/16/2016 08/02/2016  PHQ - 2 Score 0 0 0 0 0 0 0    Fall Risk Fall Risk  02/22/2021 04/07/2020 02/20/2020 02/19/2019 09/18/2017  Falls in the past year? 0 0 0 0 No  Number falls in past yr: 0 0 0 - -  Injury with Fall? - 0 - - -  Follow up Falls evaluation completed Falls evaluation completed Falls evaluation completed - -   FALL RISK PREVENTION PERTAINING TO THE HOME:  Adequate lighting in your home to reduce risk of falls? Yes   ASSISTIVE DEVICES UTILIZED TO PREVENT FALLS: Use of a cane, walker or w/c? No   TIMED UP AND GO: Was the test  performed? No .   Cognitive  Function: Patient is alert and oriented x3.   MMSE - Mini Mental State Exam 09/18/2017 08/02/2016 05/27/2015  Orientation to time 5 5 5   Orientation to Place 5 5 5   Registration 3 3 3   Attention/ Calculation 5 5 5   Recall 3 3 3   Language- name 2 objects 2 2 2   Language- repeat 1 1 1   Language- follow 3 step command 3 3 3   Language- read & follow direction 1 1 1   Write a sentence 1 1 1   Copy design 1 1 1   Total score 30 30 30      6CIT Screen 02/19/2019  What Year? 0 points  What month? 0 points  What time? 0 points  Count back from 20 0 points  Months in reverse 0 points  Repeat phrase 0 points  Total Score 0    Immunizations Immunization History  Administered Date(s) Administered   Fluad Quad(high Dose 65+) 02/19/2019, 04/07/2020   Influenza Split 03/26/2012   Influenza, High Dose Seasonal PF 03/07/2017, 03/09/2018   Influenza,inj,Quad PF,6+ Mos 02/25/2013, 02/14/2014, 03/26/2015, 02/02/2016   Influenza,inj,quad, With Preservative 03/13/2017, 03/13/2018   PFIZER(Purple Top)SARS-COV-2 Vaccination 10/18/2019, 11/12/2019   Pneumococcal Conjugate-13 04/22/2015   Pneumococcal Polysaccharide-23 01/24/2011, 06/14/2015   Tdap 11/17/2015   Shingles vaccine- Due, Education has been provided regarding the importance of this vaccine. Advised may receive this vaccine at local pharmacy or Health Dept. Aware to provide a copy of the vaccination record if obtained from local pharmacy or Health Dept. Verbalized acceptance and understanding. Deferred.   Influenza vaccine- deferred.   Health Maintenance Health Maintenance  Topic Date Due   COVID-19 Vaccine (3 - Booster for Pfizer series) 03/10/2021 (Originally 04/13/2020)   Zoster Vaccines- Shingrix (1 of 2) 05/24/2021 (Originally 03/12/1995)   INFLUENZA VACCINE  09/10/2021 (Originally 01/11/2021)   MAMMOGRAM  03/30/2021   COLONOSCOPY (Pts 45-58yr Insurance coverage will need to be confirmed)  01/16/2023    TETANUS/TDAP  11/16/2025   DEXA SCAN  Completed   Hepatitis C Screening  Completed   PNA vac Low Risk Adult  Completed   HPV VACCINES  Aged Out   Mammogram status: Ordered 02/22/22. Pt provided with contact info and advised to call to schedule appt.   Bone density- ordered today per request.   Lung Cancer Screening: (Low Dose CT Chest recommended if Age 76-80years, 30 pack-year currently smoking OR have quit w/in 15years.) does not qualify.   Vision Screening: Recommended annual ophthalmology exams for early detection of glaucoma and other disorders of the eye.  Dental Screening: Recommended annual dental exams for proper oral hygiene.  Community Resource Referral / Chronic Care Management: CRR required this visit?  No   CCM required this visit?  No      Plan:   Keep all routine maintenance appointments.   Number provided to patient for scheduling mammogram and bone density after 03/30/21.   I have personally reviewed and noted the following in the patient's chart:   Medical and social history Use of alcohol, tobacco or illicit drugs  Current medications and supplements including opioid prescriptions. Not taking opioid.  Functional ability and status Nutritional status Physical activity Advanced directives List of other physicians Hospitalizations, surgeries, and ER visits in previous 12 months Vitals Screenings to include cognitive, depression, and falls Referrals and appointments  In addition, I have reviewed and discussed with patient certain preventive protocols, quality metrics, and best practice recommendations. A written personalized care plan for preventive services as well as general preventive health recommendations  were provided to patient via mychart.     Varney Biles, LPN   [id number]

## 2021-02-22 NOTE — Patient Instructions (Addendum)
  [name] , Thank you for taking time to come for your Medicare Wellness Visit. I appreciate your ongoing commitment to your health goals. Please review the following plan we discussed and let me know if I can assist you in the future.   These are the goals we discussed:  Goals      Reduce sugar intake        This is a list of the screening recommended for you and due dates:  Health Maintenance  Topic Date Due   COVID-19 Vaccine (3 - Booster for Pfizer series) 03/10/2021*   Zoster (Shingles) Vaccine (1 of 2) 05/24/2021*   Flu Shot  09/10/2021*   Mammogram  03/30/2021   Colon Cancer Screening  01/16/2023   Tetanus Vaccine  11/16/2025   DEXA scan (bone density measurement)  Completed   Hepatitis C Screening: USPSTF Recommendation to screen - Ages 27-79 yo.  Completed   Pneumonia vaccines  Completed   HPV Vaccine  Aged Out  *Topic was postponed. The date shown is not the original due date.

## 2021-03-01 ENCOUNTER — Encounter: Payer: Self-pay | Admitting: Internal Medicine

## 2021-03-01 DIAGNOSIS — M9903 Segmental and somatic dysfunction of lumbar region: Secondary | ICD-10-CM | POA: Diagnosis not present

## 2021-03-01 DIAGNOSIS — M5412 Radiculopathy, cervical region: Secondary | ICD-10-CM | POA: Diagnosis not present

## 2021-03-01 DIAGNOSIS — M9901 Segmental and somatic dysfunction of cervical region: Secondary | ICD-10-CM | POA: Diagnosis not present

## 2021-03-01 DIAGNOSIS — M5033 Other cervical disc degeneration, cervicothoracic region: Secondary | ICD-10-CM | POA: Diagnosis not present

## 2021-03-01 NOTE — Assessment & Plan Note (Signed)
On thyroid replacement.  Follow tsh. Recent tsh (10/19/20) - wnl.  

## 2021-03-01 NOTE — Assessment & Plan Note (Signed)
Continue crestor 

## 2021-03-01 NOTE — Assessment & Plan Note (Signed)
Increased stress as outlined.  Discussed.  Has buspar if needed.  Does not feel needs any further intervention at this time.  Follow.

## 2021-03-01 NOTE — Assessment & Plan Note (Signed)
Low carb diet and exercise.  Follow met b and a1c.  

## 2021-03-01 NOTE — Assessment & Plan Note (Signed)
Colonoscopy 01/2018.  Per pt, f/u colonoscopy recommended in 5 years.  

## 2021-03-01 NOTE — Assessment & Plan Note (Signed)
S/p EGD 11/2019.  Symptoms controlled on prilosec.

## 2021-03-01 NOTE — Assessment & Plan Note (Signed)
On crestor.  Low cholesterol diet and exercise.  Follow lipid panel and liver function tests.   

## 2021-03-02 ENCOUNTER — Telehealth: Payer: Self-pay | Admitting: Internal Medicine

## 2021-03-02 NOTE — Telephone Encounter (Signed)
Patient called in stating that she called to schedule a bone density test but was told that she needs to contact her doctor or insurance to see if it will be covered.Patient is also calling to check on the status of her carotid artery test with Dr.Dew,please advise.

## 2021-03-03 DIAGNOSIS — R0989 Other specified symptoms and signs involving the circulatory and respiratory systems: Secondary | ICD-10-CM | POA: Insufficient documentation

## 2021-03-03 NOTE — Telephone Encounter (Signed)
Order is in for carotid ultrasound.  Someone should be contacting her with an appt date and time.

## 2021-03-03 NOTE — Telephone Encounter (Signed)
Spoke with patient regarding dexa scan and mammogram. Orders are in. She just wanted to confirm insurance would cover. They will since she has not had one since 2017. She is going to call them to schedule. Patient also said that she thought she was supposed to have a scan to check her carotid arteries but has not heard from Dr Bunnie Domino office- advised I would follow up on this.

## 2021-03-03 NOTE — Telephone Encounter (Signed)
Patient is aware of below. 

## 2021-03-03 NOTE — Addendum Note (Signed)
Addended by: Alisa Graff on: 03/03/2021 02:00 PM   Modules accepted: Orders

## 2021-03-03 NOTE — Assessment & Plan Note (Signed)
Schedule carotid ultrasound.  

## 2021-03-09 DIAGNOSIS — M5412 Radiculopathy, cervical region: Secondary | ICD-10-CM | POA: Diagnosis not present

## 2021-03-09 DIAGNOSIS — M9901 Segmental and somatic dysfunction of cervical region: Secondary | ICD-10-CM | POA: Diagnosis not present

## 2021-03-09 DIAGNOSIS — M5033 Other cervical disc degeneration, cervicothoracic region: Secondary | ICD-10-CM | POA: Diagnosis not present

## 2021-03-09 DIAGNOSIS — M9903 Segmental and somatic dysfunction of lumbar region: Secondary | ICD-10-CM | POA: Diagnosis not present

## 2021-03-15 ENCOUNTER — Encounter (INDEPENDENT_AMBULATORY_CARE_PROVIDER_SITE_OTHER): Payer: Self-pay

## 2021-03-15 ENCOUNTER — Telehealth: Payer: Self-pay | Admitting: Internal Medicine

## 2021-03-15 ENCOUNTER — Encounter: Payer: Self-pay | Admitting: Family

## 2021-03-15 ENCOUNTER — Ambulatory Visit (INDEPENDENT_AMBULATORY_CARE_PROVIDER_SITE_OTHER): Payer: Medicare HMO | Admitting: Family

## 2021-03-15 VITALS — BP 101/58 | HR 68 | Temp 98.1°F | Ht 62.99 in | Wt 135.0 lb

## 2021-03-15 DIAGNOSIS — J019 Acute sinusitis, unspecified: Secondary | ICD-10-CM | POA: Diagnosis not present

## 2021-03-15 DIAGNOSIS — J0110 Acute frontal sinusitis, unspecified: Secondary | ICD-10-CM | POA: Diagnosis not present

## 2021-03-15 MED ORDER — AMOXICILLIN-POT CLAVULANATE 875-125 MG PO TABS: 1.0000 | ORAL_TABLET | Freq: Two times a day (BID) | ORAL | 0 refills | Status: AC

## 2021-03-15 MED ORDER — BENZONATATE 100 MG PO CAPS: 100.0000 mg | ORAL_CAPSULE | Freq: Three times a day (TID) | ORAL | 1 refills | Status: DC | PRN

## 2021-03-15 NOTE — Telephone Encounter (Signed)
FYI

## 2021-03-15 NOTE — Progress Notes (Signed)
Verbal consent for services obtained from patient prior to services given to TELEPHONE visit:   Location of call:  provider at work patient at home  Names of all persons present for services: Mable Paris, NP and patient Acute visit for congestion, cough  Symptoms started 4 days ago, with improvement. Energy has improved.   Home COVID test was negative 4 days ago.  She complains of sinus congestion, cough and concern for infection. Mucus is yellow in color.  She feels SOB slightly when taking taking deep deep breath. No sob when talking or walking around house. No cp, fever, facial swelling.   She started spirometer 3 days ago ( she had this when she had covid). She started using levofloxacin and budesonide yesterday in her sinus rinse which was prescribed by Dr Pryor Ochoa; he started during allergy seasons.   She has been using mucinex, sinus rinse without relief. .   She also complains of diarrhea x 4 days, improved. One episode of diarrhea today. Non bloody stool. No abdominal pain. Eating soup with relief. Drinking a lot of water.   Covid vaccinated.    She has h/o seasonal allergies  History of aortic atherosclerosis, GERD, hypothyroidism Former smoker  BP Readings from Last 3 Encounters:  03/15/21 (!) 101/58  02/22/21 126/64  10/21/20 106/68    A/P/next steps:  Problem List Items Addressed This Visit       Respiratory   Acute sinusitis - Primary   Relevant Medications   amoxicillin-clavulanate (AUGMENTIN) 875-125 MG tablet   benzonatate (TESSALON) 100 MG capsule   Sinusitis    Afebrile. No respiratory distress. Start antibiotic based on duration of symptoms. Advised to repeat covid test.  Start augmentin. Advised probiotic.  She will let me know how she is doing and if symptoms, including diarrhea, congestion or to worsen in any way.      Relevant Medications   amoxicillin-clavulanate (AUGMENTIN) 875-125 MG tablet   benzonatate (TESSALON) 100 MG capsule     I  spent 15 min  discussing plan of care over the phone.

## 2021-03-15 NOTE — Patient Instructions (Addendum)
Start augmentin  Ensure to take probiotics while on antibiotics and also for 2 weeks after completion. This can either be by eating yogurt daily or taking a probiotic supplement over the counter such as Culturelle.It is important to re-colonize the gut with good bacteria and also to prevent any diarrheal infections associated with antibiotic use.

## 2021-03-15 NOTE — Assessment & Plan Note (Addendum)
Afebrile. No respiratory distress. Start antibiotic based on duration of symptoms. Advised to repeat covid test.  Start augmentin. Advised probiotic.  She will let me know how she is doing and if symptoms, including diarrhea, congestion or to worsen in any way.

## 2021-03-15 NOTE — Telephone Encounter (Signed)
Patient informed, Due to the high volume of calls and your symptoms we have to forward your call to our Triage Nurse to expedient your call. Please hold for the transfer.  Patient transferred to Access Nurse. Due to having allergy and sinus symptoms staring 9/29 with no fever but chest congestion.She took a COVID on 10/1 and it was negative and she unsure if she needs to talk with the nurse or her provider.Patient transferred to Access Nurse and while on call found an opening for the patient this afternoon at 3:30 p.m. with Mable Paris.

## 2021-03-16 ENCOUNTER — Encounter: Payer: Self-pay | Admitting: Family

## 2021-03-17 ENCOUNTER — Other Ambulatory Visit: Payer: Self-pay

## 2021-03-17 ENCOUNTER — Ambulatory Visit
Admission: RE | Admit: 2021-03-17 | Discharge: 2021-03-17 | Disposition: A | Discharge status: Home / Self Care | Payer: Medicare HMO | Source: Ambulatory Visit | Attending: Internal Medicine | Admitting: Internal Medicine

## 2021-03-17 ENCOUNTER — Telehealth: Payer: Self-pay | Admitting: Internal Medicine

## 2021-03-17 ENCOUNTER — Ambulatory Visit
Admission: RE | Admit: 2021-03-17 | Discharge: 2021-03-17 | Disposition: A | Discharge status: Home / Self Care | Payer: Medicare HMO | Source: Ambulatory Visit | Attending: Family | Admitting: Family

## 2021-03-17 DIAGNOSIS — Z03818 Encounter for observation for suspected exposure to other biological agents ruled out: Secondary | ICD-10-CM | POA: Diagnosis not present

## 2021-03-17 DIAGNOSIS — U071 COVID-19: Secondary | ICD-10-CM

## 2021-03-17 DIAGNOSIS — Z20822 Contact with and (suspected) exposure to covid-19: Secondary | ICD-10-CM | POA: Diagnosis not present

## 2021-03-17 DIAGNOSIS — R059 Cough, unspecified: Secondary | ICD-10-CM | POA: Diagnosis not present

## 2021-03-17 NOTE — Telephone Encounter (Signed)
Patient calling in and informed.   States her breathing is shallow, she is doing the breathing exercises that they give you after surgery. Patient declines chest pain, she states she has chest congestion and her chest is tight. Declines jaw/arm pain or numbness, no heart palpitations.    Patient will go to have the chest x-ray. States she has been on the Mucinex DM pills since Sunday

## 2021-03-17 NOTE — Telephone Encounter (Signed)
Agree with need for cxr and evaluation if chest pressure and persistent symptoms.

## 2021-03-17 NOTE — Telephone Encounter (Signed)
LMTCB

## 2021-03-17 NOTE — Telephone Encounter (Signed)
Symptom onset 6 days ago so outside window for antiviral  CXR ordered and advise her to to [place] today  Triage for sob, cp. Patient mentioned chest heaviness which is concerning. Please clarify here. Is she have feeling of elephant on her chest? If she has CP, left arm or jaw numbness or pain, sob, palpitations, she would need to call 911 or go to nearest ED to ensure not cardiac in nature.   For congestion, she can start plain mucinex with plenty of water.

## 2021-03-17 NOTE — Telephone Encounter (Signed)
noted 

## 2021-03-17 NOTE — Telephone Encounter (Signed)
Patient calling in and states she has been sick since 03/12/21 with sinus symptoms and chest congestion. Was seen virtually this week and given antibiotics and cough medication.   States her Covid test was negative, tested twice. States she is having a lot of chest congestion that she can not get broken up. Has a heavy sensation in her chest.   Patient is requesting a chest x-ray to make sure she does not have pneumonia.   Please advise

## 2021-03-17 NOTE — Telephone Encounter (Signed)
FYI

## 2021-03-19 ENCOUNTER — Other Ambulatory Visit: Payer: Self-pay

## 2021-03-19 ENCOUNTER — Encounter: Payer: Self-pay | Admitting: Internal Medicine

## 2021-03-19 DIAGNOSIS — U071 COVID-19: Secondary | ICD-10-CM

## 2021-03-19 NOTE — Telephone Encounter (Signed)
See cxr results.  Per xray, pt feeling better.  Taking augmentin.  See result note for f/u.

## 2021-03-19 NOTE — Telephone Encounter (Signed)
See cxr note regarding f/u. Taking augmentin.  Feeling better.  See note.

## 2021-03-22 ENCOUNTER — Encounter: Payer: Self-pay | Admitting: Family

## 2021-03-23 ENCOUNTER — Other Ambulatory Visit: Payer: Self-pay | Admitting: Internal Medicine

## 2021-03-23 NOTE — Progress Notes (Signed)
Opened in error

## 2021-03-23 NOTE — Telephone Encounter (Signed)
Reviewed.  See note from [place] regarding work in appt.

## 2021-03-24 ENCOUNTER — Encounter: Payer: Self-pay | Admitting: Family

## 2021-03-24 ENCOUNTER — Telehealth: Payer: Self-pay | Admitting: Family

## 2021-03-24 DIAGNOSIS — M9901 Segmental and somatic dysfunction of cervical region: Secondary | ICD-10-CM | POA: Diagnosis not present

## 2021-03-24 DIAGNOSIS — M5412 Radiculopathy, cervical region: Secondary | ICD-10-CM | POA: Diagnosis not present

## 2021-03-24 DIAGNOSIS — M5033 Other cervical disc degeneration, cervicothoracic region: Secondary | ICD-10-CM | POA: Diagnosis not present

## 2021-03-24 DIAGNOSIS — M9903 Segmental and somatic dysfunction of lumbar region: Secondary | ICD-10-CM | POA: Diagnosis not present

## 2021-03-24 NOTE — Telephone Encounter (Signed)
Called and spoke with patient She slept great last night without cough. She feels well this morning. Endorses nasal congestion She clarifies that she tested herself 3 times for covid and all negative, including a pcr test.   No fever, cp, wheezing, sob.     Plan: Improved today. Reassured patient that I would expect gradual improvement day by day.   Discussed extending augmentin by 5 days if symptoms were to fail to completely resolve.   Discussed prednisone if cough persisted however she prefers not to use prednisone as husband had hyperglycemia  Advised continued use of incentive spirometry, mucinex DM, plenty of water, and tesslon perles Counseled on how cough can linger after URI.   She prefers to have CXR repeated within 2 weeks to monitor infiltrate/atelectasis versus waiting 4 weeks.   She will keep [place] informed

## 2021-03-29 ENCOUNTER — Other Ambulatory Visit: Payer: Medicare HMO

## 2021-03-31 NOTE — Telephone Encounter (Signed)
Pt scheduled with Joycelyn Schmid 10/26

## 2021-04-07 ENCOUNTER — Ambulatory Visit: Payer: Medicare HMO | Admitting: Family

## 2021-04-08 ENCOUNTER — Ambulatory Visit
Admission: RE | Admit: 2021-04-08 | Discharge: 2021-04-08 | Disposition: A | Discharge status: Home / Self Care | Payer: Medicare HMO | Source: Ambulatory Visit | Attending: Internal Medicine | Admitting: Internal Medicine

## 2021-04-08 ENCOUNTER — Other Ambulatory Visit: Payer: Self-pay

## 2021-04-08 DIAGNOSIS — E2839 Other primary ovarian failure: Secondary | ICD-10-CM | POA: Diagnosis present

## 2021-04-08 DIAGNOSIS — Z1231 Encounter for screening mammogram for malignant neoplasm of breast: Secondary | ICD-10-CM

## 2021-04-08 DIAGNOSIS — M8589 Other specified disorders of bone density and structure, multiple sites: Secondary | ICD-10-CM | POA: Diagnosis not present

## 2021-04-13 DIAGNOSIS — M9901 Segmental and somatic dysfunction of cervical region: Secondary | ICD-10-CM | POA: Diagnosis not present

## 2021-04-13 DIAGNOSIS — M5412 Radiculopathy, cervical region: Secondary | ICD-10-CM | POA: Diagnosis not present

## 2021-04-13 DIAGNOSIS — M5033 Other cervical disc degeneration, cervicothoracic region: Secondary | ICD-10-CM | POA: Diagnosis not present

## 2021-04-13 DIAGNOSIS — M9903 Segmental and somatic dysfunction of lumbar region: Secondary | ICD-10-CM | POA: Diagnosis not present

## 2021-04-16 ENCOUNTER — Other Ambulatory Visit: Payer: Medicare HMO

## 2021-04-20 ENCOUNTER — Ambulatory Visit (INDEPENDENT_AMBULATORY_CARE_PROVIDER_SITE_OTHER): Payer: Medicare HMO

## 2021-04-20 ENCOUNTER — Other Ambulatory Visit: Payer: Self-pay

## 2021-04-20 ENCOUNTER — Other Ambulatory Visit: Payer: Medicare HMO

## 2021-04-20 DIAGNOSIS — Z8701 Personal history of pneumonia (recurrent): Secondary | ICD-10-CM | POA: Diagnosis not present

## 2021-04-20 DIAGNOSIS — R918 Other nonspecific abnormal finding of lung field: Secondary | ICD-10-CM | POA: Diagnosis not present

## 2021-04-20 DIAGNOSIS — U071 COVID-19: Secondary | ICD-10-CM | POA: Diagnosis not present

## 2021-04-21 ENCOUNTER — Encounter: Payer: Self-pay | Admitting: Family

## 2021-04-21 ENCOUNTER — Ambulatory Visit (INDEPENDENT_AMBULATORY_CARE_PROVIDER_SITE_OTHER): Payer: Medicare HMO

## 2021-04-21 ENCOUNTER — Encounter: Payer: Self-pay | Admitting: Internal Medicine

## 2021-04-21 DIAGNOSIS — R0989 Other specified symptoms and signs involving the circulatory and respiratory systems: Secondary | ICD-10-CM | POA: Diagnosis not present

## 2021-04-21 DIAGNOSIS — I779 Disorder of arteries and arterioles, unspecified: Secondary | ICD-10-CM | POA: Insufficient documentation

## 2021-04-22 ENCOUNTER — Telehealth: Payer: Self-pay

## 2021-04-22 NOTE — Telephone Encounter (Signed)
Patient returned office phone call. 

## 2021-04-22 NOTE — Telephone Encounter (Signed)
-----   Message from Einar Pheasant, MD sent at 04/21/2021  9:36 PM EST ----- Notify - carotid ultrasound reveals some build up in the arteries, but no significant blockage that requires intervention.   We will  follow.

## 2021-05-05 DIAGNOSIS — M5033 Other cervical disc degeneration, cervicothoracic region: Secondary | ICD-10-CM | POA: Diagnosis not present

## 2021-05-05 DIAGNOSIS — M9901 Segmental and somatic dysfunction of cervical region: Secondary | ICD-10-CM | POA: Diagnosis not present

## 2021-05-05 DIAGNOSIS — M5412 Radiculopathy, cervical region: Secondary | ICD-10-CM | POA: Diagnosis not present

## 2021-05-05 DIAGNOSIS — M9903 Segmental and somatic dysfunction of lumbar region: Secondary | ICD-10-CM | POA: Diagnosis not present

## 2021-05-18 DIAGNOSIS — M9901 Segmental and somatic dysfunction of cervical region: Secondary | ICD-10-CM | POA: Diagnosis not present

## 2021-05-18 DIAGNOSIS — M9903 Segmental and somatic dysfunction of lumbar region: Secondary | ICD-10-CM | POA: Diagnosis not present

## 2021-05-18 DIAGNOSIS — M5412 Radiculopathy, cervical region: Secondary | ICD-10-CM | POA: Diagnosis not present

## 2021-05-18 DIAGNOSIS — M5033 Other cervical disc degeneration, cervicothoracic region: Secondary | ICD-10-CM | POA: Diagnosis not present

## 2021-05-26 DIAGNOSIS — M19071 Primary osteoarthritis, right ankle and foot: Secondary | ICD-10-CM | POA: Diagnosis not present

## 2021-05-26 DIAGNOSIS — M19072 Primary osteoarthritis, left ankle and foot: Secondary | ICD-10-CM | POA: Diagnosis not present

## 2021-05-27 DIAGNOSIS — H6063 Unspecified chronic otitis externa, bilateral: Secondary | ICD-10-CM | POA: Diagnosis not present

## 2021-05-27 DIAGNOSIS — H6123 Impacted cerumen, bilateral: Secondary | ICD-10-CM | POA: Diagnosis not present

## 2021-05-27 DIAGNOSIS — J324 Chronic pansinusitis: Secondary | ICD-10-CM | POA: Diagnosis not present

## 2021-05-27 DIAGNOSIS — R591 Generalized enlarged lymph nodes: Secondary | ICD-10-CM | POA: Diagnosis not present

## 2021-06-04 ENCOUNTER — Telehealth: Payer: Self-pay | Admitting: Internal Medicine

## 2021-06-04 ENCOUNTER — Telehealth: Payer: Medicare HMO | Admitting: Physician Assistant

## 2021-06-04 VITALS — BP 120/72 | HR 79 | Temp 97.8°F

## 2021-06-04 DIAGNOSIS — J208 Acute bronchitis due to other specified organisms: Secondary | ICD-10-CM

## 2021-06-04 DIAGNOSIS — B9689 Other specified bacterial agents as the cause of diseases classified elsewhere: Secondary | ICD-10-CM

## 2021-06-04 MED ORDER — AMOXICILLIN-POT CLAVULANATE 875-125 MG PO TABS: 1.0000 | ORAL_TABLET | Freq: Two times a day (BID) | ORAL | 0 refills | Status: DC

## 2021-06-04 NOTE — Patient Instructions (Signed)
[name], thank you for joining Mar Daring, PA-C for today's virtual visit.  While this provider is not your primary care provider (PCP), if your PCP is located in our provider database this encounter information will be shared with them immediately following your visit.  Consent: (Patient) [name] provided verbal consent for this virtual visit at the beginning of the encounter.  Current Medications:  Current Outpatient Medications:    amoxicillin-clavulanate (AUGMENTIN) 875-125 MG tablet, Take 1 tablet by mouth 2 (two) times daily., Disp: 14 tablet, Rfl: 0   acetaminophen (TYLENOL) 500 MG tablet, Take 500 mg by mouth every 6 (six) hours as needed., Disp: , Rfl:    acyclovir (ZOVIRAX) 800 MG tablet, Take 1 tablet (800 mg total) by mouth 2 (two) times daily., Disp: 60 tablet, Rfl: 0   Ascorbic Acid (VITAMIN C) 500 MG CAPS, Take 500 mg by mouth daily., Disp: , Rfl:    benzonatate (TESSALON) 100 MG capsule, Take 1 capsule (100 mg total) by mouth 3 (three) times daily as needed for cough., Disp: 20 capsule, Rfl: 1   budesonide (PULMICORT) 1 MG/2ML nebulizer solution, ADD 1ML OF MEDICATION TO 240ML OF SALINE IN SALINE IRRIGATION BOTTLE; IRRIGATE SINUSES WITH 120ML THROUGH EACH NOSTRIL TWICE DAILY, Disp: , Rfl:    BUDESONIDE PO, 2 (two) times daily., Disp: , Rfl:    busPIRone (BUSPAR) 5 MG tablet, Take 5 mg by mouth daily as needed., Disp: , Rfl:    Cholecalciferol (VITAMIN D PO), Take 10,000 Units by mouth daily. , Disp: , Rfl:    Coenzyme Q10 (COQ-10 PO), Take 120 mg by mouth daily., Disp: , Rfl:    COLLAGEN PO, Take 11 mg by mouth daily., Disp: , Rfl:    Cyanocobalamin (B-12 PO), Take 5,000 mcg by mouth daily., Disp: , Rfl:    diclofenac sodium (VOLTAREN) 1 % GEL, Apply 1 application topically as needed., Disp: , Rfl:    ELDERBERRY PO, Take 125 mg by mouth daily. , Disp: , Rfl:    fluocinonide cream (LIDEX) 0.93 %, Apply 1 application topically 2 (two) times daily as  needed. , Disp: , Rfl:    Hypertonic Nasal Wash (SINUS RINSE NA), Place into the nose as needed., Disp: , Rfl:    LEVOFLOXACIN PO, 2 (two) times daily. levofloxacin 100mg /ml cash  ADD 1ML OF MEDICATION TO 240ML OF SALINE IN SALINE IRRIGATION BOTTLE; IRRIGATE, Disp: , Rfl:    levothyroxine (SYNTHROID) 50 MCG tablet, Take 1 tablet (50 mcg total) by mouth every other day., Disp: 45 tablet, Rfl: 3   levothyroxine (SYNTHROID) 75 MCG tablet, Take 1 tablet (75 mcg total) by mouth every other day., Disp: 45 tablet, Rfl: 3   NON FORMULARY, DE 3 Omega Benefits TID, Disp: , Rfl:    Omega-3 Fatty Acids (FISH OIL PO), Take 1,200 mg by mouth daily., Disp: , Rfl:    omeprazole (PRILOSEC) 40 MG capsule, Take 1 capsule (40 mg total) by mouth daily., Disp: 90 capsule, Rfl: 1   Probiotic Product (PROBIOTIC DAILY PO), Take 1 capsule by mouth., Disp: , Rfl:    rosuvastatin (CRESTOR) 20 MG tablet, Take 20mg  by mouth daily on Monday, Wednesday and Friday and 10 mg by mouth all other days., Disp: 65 tablet, Rfl: 2   sodium chloride irrigation 0.9 % irrigation, sodium chloride 0.9 % irrigation solution, Disp: , Rfl:    UNABLE TO FIND, Take 3,326 mg by mouth 2 (two) times daily as needed. Med Name: CBD/CBG SWISH CELLULAR  NUTRITION 3326 MG, Disp: , Rfl:    vitamin E 400 UNIT capsule, Take 400 Units by mouth daily., Disp: , Rfl:    Medications ordered in this encounter:  Meds ordered this encounter  Medications   amoxicillin-clavulanate (AUGMENTIN) 875-125 MG tablet    Sig: Take 1 tablet by mouth 2 (two) times daily.    Dispense:  14 tablet    Refill:  0    Order Specific Question:   Supervising Provider    Answer:   Sabra Heck, BRIAN [[id number]]     *If you need refills on other medications prior to your next appointment, please contact your pharmacy*  Follow-Up: Call back or seek an in-person evaluation if the symptoms worsen or if the condition fails to improve as anticipated.  Other Instructions Sinusitis,  Adult Sinusitis is soreness and swelling (inflammation) of your sinuses. Sinuses are hollow spaces in the bones around your face. They are located: Around your eyes. In the middle of your forehead. Behind your nose. In your cheekbones. Your sinuses and nasal passages are lined with a fluid called mucus. Mucus drains out of your sinuses. Swelling can trap mucus in your sinuses. This lets germs (bacteria, virus, or fungus) grow, which leads to infection. Most of the time, this condition is caused by a virus. What are the causes? This condition is caused by: Allergies. Asthma. Germs. Things that block your nose or sinuses. Growths in the nose (nasal polyps). Chemicals or irritants in the air. Fungus (rare). What increases the risk? You are more likely to develop this condition if: You have a weak body defense system (immune system). You do a lot of swimming or diving. You use nasal sprays too much. You smoke. What are the signs or symptoms? The main symptoms of this condition are pain and a feeling of pressure around the sinuses. Other symptoms include: Stuffy nose (congestion). Runny nose (drainage). Swelling and warmth in the sinuses. Headache. Toothache. A cough that may get worse at night. Mucus that collects in the throat or the back of the nose (postnasal drip). Being unable to smell and taste. Being very tired (fatigue). A fever. Sore throat. Bad breath. How is this diagnosed? This condition is diagnosed based on: Your symptoms. Your medical history. A physical exam. Tests to find out if your condition is short-term (acute) or long-term (chronic). Your doctor may: Check your nose for growths (polyps). Check your sinuses using a tool that has a light (endoscope). Check for allergies or germs. Do imaging tests, such as an MRI or CT scan. How is this treated? Treatment for this condition depends on the cause and whether it is short-term or long-term. If caused by a  virus, your symptoms should go away on their own within 10 days. You may be given medicines to relieve symptoms. They include: Medicines that shrink swollen tissue in the nose. Medicines that treat allergies (antihistamines). A spray that treats swelling of the nostrils.  Rinses that help get rid of thick mucus in your nose (nasal saline washes). If caused by bacteria, your doctor may wait to see if you will get better without treatment. You may be given antibiotic medicine if you have: A very bad infection. A weak body defense system. If caused by growths in the nose, you may need to have surgery. Follow these instructions at home: Medicines Take, use, or apply over-the-counter and prescription medicines only as told by your doctor. These may include nasal sprays. If you were prescribed an antibiotic medicine, take  it as told by your doctor. Do not stop taking the antibiotic even if you start to feel better. Hydrate and humidify  Drink enough water to keep your pee (urine) pale yellow. Use a cool mist humidifier to keep the humidity level in your home above 50%. Breathe in steam for 10-15 minutes, 3-4 times a day, or as told by your doctor. You can do this in the bathroom while a hot shower is running. Try not to spend time in cool or dry air. Rest Rest as much as you can. Sleep with your head raised (elevated). Make sure you get enough sleep each night. General instructions  Put a warm, moist washcloth on your face 3-4 times a day, or as often as told by your doctor. This will help with discomfort. Wash your hands often with soap and water. If there is no soap and water, use hand sanitizer. Do not smoke. Avoid being around people who are smoking (secondhand smoke). Keep all follow-up visits as told by your doctor. This is important. Contact a doctor if: You have a fever. Your symptoms get worse. Your symptoms do not get better within 10 days. Get help right away if: You have a  very bad headache. You cannot stop throwing up (vomiting). You have very bad pain or swelling around your face or eyes. You have trouble seeing. You feel confused. Your neck is stiff. You have trouble breathing. Summary Sinusitis is swelling of your sinuses. Sinuses are hollow spaces in the bones around your face. This condition is caused by tissues in your nose that become inflamed or swollen. This traps germs. These can lead to infection. If you were prescribed an antibiotic medicine, take it as told by your doctor. Do not stop taking it even if you start to feel better. Keep all follow-up visits as told by your doctor. This is important. This information is not intended to replace advice given to you by your health care provider. Make sure you discuss any questions you have with your health care provider. Document Revised: 10/30/2017 Document Reviewed: 10/30/2017 Elsevier Patient Education  2022 [employer].   Acute Bronchitis, Adult Acute bronchitis is when air tubes in the lungs (bronchi) suddenly get swollen. The condition can make it hard for you to breathe. In adults, acute bronchitis usually goes away within 2 weeks. A cough caused by bronchitis may last up to 3 weeks. Smoking, allergies, and asthma can make the condition worse. What are the causes? Germs that cause cold and flu (viruses). The most common cause of this condition is the virus that causes the common cold. Bacteria. Substances that bother (irritate) the lungs, including: Smoke from cigarettes and other types of tobacco. Dust and pollen. Fumes from chemicals, gases, or burned fuel. Indoor or outdoor air pollution. What increases the risk? A weak body's defense system. This is also called the immune system. Any condition that affects your lungs and breathing, such as asthma. What are the signs or symptoms? A cough. Coughing up clear, yellow, or green mucus. Making high-pitched whistling sounds when you breathe,  most often when you breathe out (wheezing). Runny or stuffy nose. Having too much mucus in your lungs (chest congestion). Shortness of breath. Body aches. A sore throat. How is this treated? Acute bronchitis may go away over time without treatment. Your doctor may tell you to: Drink more fluids. This will help thin your mucus so it is easier to cough up. Use a device that gets medicine into your lungs (  inhaler). Use a vaporizer or a humidifier. These are machines that add water to the air. This helps with coughing and poor breathing. Take a medicine that thins mucus and helps clear it from your lungs. Take a medicine that prevents or stops coughing. It is not common to take an antibiotic medicine for this condition. Follow these instructions at home:  Take over-the-counter and prescription medicines only as told by your doctor. Use an inhaler, vaporizer, or humidifier as told by your doctor. Take two teaspoons (10 mL) of honey at bedtime. This helps lessen your coughing at night. Drink enough fluid to keep your pee (urine) pale yellow. Do not smoke or use any products that contain nicotine or tobacco. If you need help quitting, ask your doctor. Get a lot of rest. Return to your normal activities when your doctor says that it is safe. Keep all follow-up visits. How is this prevented?  Wash your hands often with soap and water for at least 20 seconds. If you cannot use soap and water, use hand sanitizer. Avoid contact with people who have cold symptoms. Try not to touch your mouth, nose, or eyes with your hands. Avoid breathing in smoke or chemical fumes. Make sure to get the flu shot every year. Contact a doctor if: Your symptoms do not get better in 2 weeks. You have trouble coughing up the mucus. Your cough keeps you awake at night. You have a fever. Get help right away if: You cough up blood. You have chest pain. You have very bad shortness of breath. You faint or keep  feeling like you are going to faint. You have a very bad headache. Your fever or chills get worse. These symptoms may be an emergency. Get help right away. Call your local emergency services (911 in the [place] Do not wait to see if the symptoms will go away. Do not drive yourself to the hospital. Summary Acute bronchitis is when air tubes in the lungs (bronchi) suddenly get swollen. In adults, acute bronchitis usually goes away within 2 weeks. Drink more fluids. This will help thin your mucus so it is easier to cough up. Take over-the-counter and prescription medicines only as told by your doctor. Contact a doctor if your symptoms do not improve after 2 weeks of treatment. This information is not intended to replace advice given to you by your health care provider. Make sure you discuss any questions you have with your health care provider. Document Revised: 09/30/2020 Document Reviewed: 09/30/2020 Elsevier Patient Education  2022 [employer].    If you have been instructed to have an in-person evaluation today at a local Urgent Care facility, please use the link below. It will take you to a list of all of our available [facility], including address, phone number and hours of operation. Please do not delay care.  [facility]  If you or a family member do not have a primary care provider, use the link below to schedule a visit and establish care. When you choose a [facility] primary care physician or advanced practice provider, you gain a long-term partner in health. Find a Primary Care Provider  Learn more about [facility] in-office and virtual care options: [facility]

## 2021-06-04 NOTE — Telephone Encounter (Signed)
Called patient and advised we are unable to prescribe medication without a visit and office will be closed for the holidays. Scheduled patient for my chart video visit this PM because patient is on her way back in town and will not be home for 2 hours. Patient will do mychart video urgent care and f/u if needed

## 2021-06-04 NOTE — Telephone Encounter (Signed)
Pt called in stating that she have been sick for a few days. Pt stated that she is taking OTC medication for her cold. Pt stated that the mucinex she been taking is not helping. Pt stated that her symptoms are heavy on the chest, and mucus. Pt is requesting for medication to be sent to pharmacy.

## 2021-06-04 NOTE — Progress Notes (Signed)
Virtual Visit Consent   [name], you are scheduled for a virtual visit with a [name] provider today.     Just as with appointments in the office, your consent must be obtained to participate.  Your consent will be active for this visit and any virtual visit you may have with one of our providers in the next 365 days.     If you have a MyChart account, a copy of this consent can be sent to you electronically.  All virtual visits are billed to your insurance company just like a traditional visit in the office.    As this is a virtual visit, video technology does not allow for your provider to perform a traditional examination.  This may limit your provider's ability to fully assess your condition.  If your provider identifies any concerns that need to be evaluated in person or the need to arrange testing (such as labs, EKG, etc.), we will make arrangements to do so.     Although advances in technology are sophisticated, we cannot ensure that it will always work on either your end or our end.  If the connection with a video visit is poor, the visit may have to be switched to a telephone visit.  With either a video or telephone visit, we are not always able to ensure that we have a secure connection.     I need to obtain your verbal consent now.   Are you willing to proceed with your visit today?    [name] has provided verbal consent on 06/04/2021 for a virtual visit (video or telephone).   Mar Daring, PA-C   Date: 06/04/2021 5:23 PM   Virtual Visit via Video Note   I, Mar Daring, connected with  ETHERINE Chavez  ([id number], Jun 16, 1945) on 06/04/21 at  5:00 PM EST by a video-enabled telemedicine application and verified that I am speaking with the correct person using two identifiers.  Location: Patient: Virtual Visit Location Patient: Home Provider: Virtual Visit Location Provider: Home Office   I discussed the limitations of evaluation and  management by telemedicine and the availability of in person appointments. The patient expressed understanding and agreed to proceed.    History of Present Illness: [name] is a 75 y.o. who identifies as a female who was assigned female at birth, and is being seen today for URI symptoms.  HPI: URI  This is a new problem. The current episode started in the past 7 days. The problem has been gradually worsening. There has been no fever. Associated symptoms include chest pain (heaviness), congestion, coughing (productive), a plugged ear sensation, rhinorrhea, sinus pain and a sore throat. Pertinent negatives include no ear pain or headaches. Associated symptoms comments: Aphthous ulcer on side of tongue, fatigue. She has tried increased fluids and sleep (salt water gargles, saline nasal rinses, tylenol, mucinex) for the symptoms. The treatment provided mild relief.   Home covid testing is negative x 2.  Problems:  Patient Active Problem List   Diagnosis Date Noted   Carotid artery disease ([place]) 04/21/2021   Left carotid bruit 03/03/2021   Aortic atherosclerosis ([place]) 10/21/2020   Hyperglycemia 04/12/2020   Change in bowel habits 01/16/2020   Hot flashes 09/23/2019   Sinusitis 05/21/2019   COVID-19 virus infection 05/18/2019   Left hip pain 02/19/2019   Axillary fullness 02/19/2019   Lymphadenopathy 10/17/2018   Neck pain 04/29/2018   Swelling of right hand 12/24/2017   Acute  sinusitis 09/21/2015   Loose stools 08/02/2015   Health care maintenance 01/29/2015   Right foot pain 02/17/2014   History of colonic polyps 05/07/2013   Stress 02/27/2013   Hypercholesteremia 03/26/2012   Hypothyroidism 03/26/2012   GERD (gastroesophageal reflux disease) 03/26/2012    Allergies:  Allergies  Allergen Reactions   Diclofenac Nausea Only   Ibuprofen Other (See Comments)    Stomach issues   Tramadol Nausea Only   Hydromorphone Rash   Oxycodone Other (See Comments)    constipation    Medications:  Current Outpatient Medications:    amoxicillin-clavulanate (AUGMENTIN) 875-125 MG tablet, Take 1 tablet by mouth 2 (two) times daily., Disp: 14 tablet, Rfl: 0   acetaminophen (TYLENOL) 500 MG tablet, Take 500 mg by mouth every 6 (six) hours as needed., Disp: , Rfl:    acyclovir (ZOVIRAX) 800 MG tablet, Take 1 tablet (800 mg total) by mouth 2 (two) times daily., Disp: 60 tablet, Rfl: 0   Ascorbic Acid (VITAMIN C) 500 MG CAPS, Take 500 mg by mouth daily., Disp: , Rfl:    benzonatate (TESSALON) 100 MG capsule, Take 1 capsule (100 mg total) by mouth 3 (three) times daily as needed for cough., Disp: 20 capsule, Rfl: 1   budesonide (PULMICORT) 1 MG/2ML nebulizer solution, ADD 1ML OF MEDICATION TO 240ML OF SALINE IN SALINE IRRIGATION BOTTLE; IRRIGATE SINUSES WITH 120ML THROUGH EACH NOSTRIL TWICE DAILY, Disp: , Rfl:    BUDESONIDE PO, 2 (two) times daily., Disp: , Rfl:    busPIRone (BUSPAR) 5 MG tablet, Take 5 mg by mouth daily as needed., Disp: , Rfl:    Cholecalciferol (VITAMIN D PO), Take 10,000 Units by mouth daily. , Disp: , Rfl:    Coenzyme Q10 (COQ-10 PO), Take 120 mg by mouth daily., Disp: , Rfl:    COLLAGEN PO, Take 11 mg by mouth daily., Disp: , Rfl:    Cyanocobalamin (B-12 PO), Take 5,000 mcg by mouth daily., Disp: , Rfl:    diclofenac sodium (VOLTAREN) 1 % GEL, Apply 1 application topically as needed., Disp: , Rfl:    ELDERBERRY PO, Take 125 mg by mouth daily. , Disp: , Rfl:    fluocinonide cream (LIDEX) 7.25 %, Apply 1 application topically 2 (two) times daily as needed. , Disp: , Rfl:    Hypertonic Nasal Wash (SINUS RINSE NA), Place into the nose as needed., Disp: , Rfl:    LEVOFLOXACIN PO, 2 (two) times daily. levofloxacin 100mg /ml cash  ADD 1ML OF MEDICATION TO 240ML OF SALINE IN SALINE IRRIGATION BOTTLE; IRRIGATE, Disp: , Rfl:    levothyroxine (SYNTHROID) 50 MCG tablet, Take 1 tablet (50 mcg total) by mouth every other day., Disp: 45 tablet, Rfl: 3   levothyroxine  (SYNTHROID) 75 MCG tablet, Take 1 tablet (75 mcg total) by mouth every other day., Disp: 45 tablet, Rfl: 3   NON FORMULARY, DE 3 Omega Benefits TID, Disp: , Rfl:    Omega-3 Fatty Acids (FISH OIL PO), Take 1,200 mg by mouth daily., Disp: , Rfl:    omeprazole (PRILOSEC) 40 MG capsule, Take 1 capsule (40 mg total) by mouth daily., Disp: 90 capsule, Rfl: 1   Probiotic Product (PROBIOTIC DAILY PO), Take 1 capsule by mouth., Disp: , Rfl:    rosuvastatin (CRESTOR) 20 MG tablet, Take 20mg  by mouth daily on Monday, Wednesday and Friday and 10 mg by mouth all other days., Disp: 65 tablet, Rfl: 2   sodium chloride irrigation 0.9 % irrigation, sodium chloride 0.9 % irrigation solution, Disp: ,  Rfl:    UNABLE TO FIND, Take 3,326 mg by mouth 2 (two) times daily as needed. Med Name: CBD/CBG SWISH CELLULAR NUTRITION 3326 MG, Disp: , Rfl:    vitamin E 400 UNIT capsule, Take 400 Units by mouth daily., Disp: , Rfl:   Observations/Objective: Patient is well-developed, well-nourished in no acute distress.  Resting comfortably at home.  No labored breathing.  Speech is clear and coherent with logical content.  Patient is alert and oriented at baseline.    Assessment and Plan: 1. Acute bacterial bronchitis - amoxicillin-clavulanate (AUGMENTIN) 875-125 MG tablet; Take 1 tablet by mouth 2 (two) times daily.  Dispense: 14 tablet; Refill: 0  - Worsening symptoms that have not responded to OTC medications.  - Will give augmentin  - Continue allergy medications.  - Stay well hydrated and get plenty of rest.  - Seek in person evaluation if no symptom improvement or if symptoms worsen.  Follow Up Instructions: I discussed the assessment and treatment plan with the patient. The patient was provided an opportunity to ask questions and all were answered. The patient agreed with the plan and demonstrated an understanding of the instructions.  A copy of instructions were sent to the patient via MyChart unless otherwise  noted below.    The patient was advised to call back or seek an in-person evaluation if the symptoms worsen or if the condition fails to improve as anticipated.  Time:  I spent 15 minutes with the patient via telehealth technology discussing the above problems/concerns.    Mar Daring, PA-C

## 2021-06-05 ENCOUNTER — Encounter: Payer: Self-pay | Admitting: Physician Assistant

## 2021-06-05 ENCOUNTER — Other Ambulatory Visit: Payer: Self-pay | Admitting: Nurse Practitioner

## 2021-06-05 DIAGNOSIS — J208 Acute bronchitis due to other specified organisms: Secondary | ICD-10-CM

## 2021-06-05 DIAGNOSIS — B9689 Other specified bacterial agents as the cause of diseases classified elsewhere: Secondary | ICD-10-CM

## 2021-06-05 MED ORDER — AMOXICILLIN-POT CLAVULANATE 875-125 MG PO TABS: 1.0000 | ORAL_TABLET | Freq: Two times a day (BID) | ORAL | 0 refills | Status: DC

## 2021-06-15 DIAGNOSIS — Z0100 Encounter for examination of eyes and vision without abnormal findings: Secondary | ICD-10-CM | POA: Diagnosis not present

## 2021-06-16 ENCOUNTER — Other Ambulatory Visit (INDEPENDENT_AMBULATORY_CARE_PROVIDER_SITE_OTHER): Payer: Medicare HMO

## 2021-06-16 ENCOUNTER — Other Ambulatory Visit: Payer: Self-pay

## 2021-06-16 DIAGNOSIS — R739 Hyperglycemia, unspecified: Secondary | ICD-10-CM | POA: Diagnosis not present

## 2021-06-16 DIAGNOSIS — E7800 Pure hypercholesterolemia, unspecified: Secondary | ICD-10-CM | POA: Diagnosis not present

## 2021-06-16 LAB — BASIC METABOLIC PANEL WITH GFR
BUN: 16 mg/dL (ref 6–23)
CO2: 30 meq/L (ref 19–32)
Calcium: 9.3 mg/dL (ref 8.4–10.5)
Chloride: 104 meq/L (ref 96–112)
Creatinine, Ser: 0.68 mg/dL (ref 0.40–1.20)
GFR: 84.69 mL/min
Glucose, Bld: 90 mg/dL (ref 70–99)
Potassium: 4.1 meq/L (ref 3.5–5.1)
Sodium: 140 meq/L (ref 135–145)

## 2021-06-16 LAB — LIPID PANEL
Cholesterol: 175 mg/dL (ref 0–200)
HDL: 61.5 mg/dL
LDL Cholesterol: 99 mg/dL (ref 0–99)
NonHDL: 113.57
Total CHOL/HDL Ratio: 3
Triglycerides: 73 mg/dL (ref 0.0–149.0)
VLDL: 14.6 mg/dL (ref 0.0–40.0)

## 2021-06-16 LAB — HEMOGLOBIN A1C: Hgb A1c MFr Bld: 5.7 % (ref 4.6–6.5)

## 2021-06-16 LAB — HEPATIC FUNCTION PANEL
ALT: 16 U/L (ref 0–35)
AST: 19 U/L (ref 0–37)
Albumin: 4.2 g/dL (ref 3.5–5.2)
Alkaline Phosphatase: 62 U/L (ref 39–117)
Bilirubin, Direct: 0.1 mg/dL (ref 0.0–0.3)
Total Bilirubin: 0.6 mg/dL (ref 0.2–1.2)
Total Protein: 6.4 g/dL (ref 6.0–8.3)

## 2021-06-18 ENCOUNTER — Ambulatory Visit (INDEPENDENT_AMBULATORY_CARE_PROVIDER_SITE_OTHER): Payer: Medicare HMO | Admitting: Internal Medicine

## 2021-06-18 ENCOUNTER — Encounter: Payer: Self-pay | Admitting: Internal Medicine

## 2021-06-18 ENCOUNTER — Other Ambulatory Visit: Payer: Self-pay

## 2021-06-18 VITALS — BP 124/70 | HR 70 | Ht 63.39 in | Wt 142.0 lb

## 2021-06-18 DIAGNOSIS — Z860100 Personal history of colon polyps, unspecified: Secondary | ICD-10-CM

## 2021-06-18 DIAGNOSIS — K2100 Gastro-esophageal reflux disease with esophagitis, without bleeding: Secondary | ICD-10-CM | POA: Diagnosis not present

## 2021-06-18 DIAGNOSIS — R200 Anesthesia of skin: Secondary | ICD-10-CM

## 2021-06-18 DIAGNOSIS — Z0000 Encounter for general adult medical examination without abnormal findings: Secondary | ICD-10-CM | POA: Diagnosis not present

## 2021-06-18 DIAGNOSIS — E039 Hypothyroidism, unspecified: Secondary | ICD-10-CM | POA: Diagnosis not present

## 2021-06-18 DIAGNOSIS — M2550 Pain in unspecified joint: Secondary | ICD-10-CM | POA: Diagnosis not present

## 2021-06-18 DIAGNOSIS — F439 Reaction to severe stress, unspecified: Secondary | ICD-10-CM | POA: Diagnosis not present

## 2021-06-18 DIAGNOSIS — M26622 Arthralgia of left temporomandibular joint: Secondary | ICD-10-CM | POA: Diagnosis not present

## 2021-06-18 DIAGNOSIS — E7800 Pure hypercholesterolemia, unspecified: Secondary | ICD-10-CM

## 2021-06-18 DIAGNOSIS — R69 Illness, unspecified: Secondary | ICD-10-CM | POA: Diagnosis not present

## 2021-06-18 DIAGNOSIS — R739 Hyperglycemia, unspecified: Secondary | ICD-10-CM | POA: Diagnosis not present

## 2021-06-18 DIAGNOSIS — I700 Atherosclerosis of aorta: Secondary | ICD-10-CM

## 2021-06-18 DIAGNOSIS — I779 Disorder of arteries and arterioles, unspecified: Secondary | ICD-10-CM

## 2021-06-18 MED ORDER — ROSUVASTATIN CALCIUM 20 MG PO TABS: 20.0000 mg | ORAL_TABLET | Freq: Every day | ORAL | 1 refills | Status: DC

## 2021-06-18 NOTE — Progress Notes (Signed)
Patient ID: YAZLYNN BIRKELAND, female   DOB: 1945-08-22, 76 y.o.   MRN: [id number]   Subjective:    Patient ID: [name], female    DOB: [birth year], 76 y.o.   MRN: [id number]  This visit occurred during the SARS-CoV-2 public health emergency.  Safety protocols were in place, including screening questions prior to the visit, additional usage of staff PPE, and extensive cleaning of exam room while observing appropriate contact time as indicated for disinfecting solutions.   Patient here for her physical exam.    Chief Complaint  Patient presents with   Annual Exam   .   HPI Was evaluated 06/04/21 - diagnosed with bronchitis.  Treated with augmentin.  Symptoms have essentially resolved.  No increased cough or congestion.  No chest pain or sob reported.  She reports previously having increased pain - left angle of jaw.  Hurts to open mouth.  Has a mouth guard.  Discussed TMJ.  Is better.  Increased stress. Feels this is aggravating.  Discussed f/u with Dr Juleen China.  Also reports increased "arthritis" pain - varying joints.  Had injections - feet - helped.  Will notify me if desires any further intervention.  Reports tip of left first finger - numbness/tingling.  No other - numbness tingling.  No weakness.  Occurs with certain position changes - driving, etc.  Eating.  No nausea or vomiting reported.  No increased acid reflux reported.  No bowel change.     Past Medical History:  Diagnosis Date   Arthritis    Environmental allergies    Esophagitis    [place], followed by Dr Vennie Homans   GERD (gastroesophageal reflux disease)    Hypercholesterolemia    Hypothyroidism    Past Surgical History:  Procedure Laterality Date   ANKLE SURGERY  1967   KNEE ARTHROSCOPY  12/03/07   left   KNEE ARTHROSCOPY  06/25/08   left   NASAL SINUS SURGERY  12/06   REPLACEMENT TOTAL KNEE  01/04/09   left   RHINOPLASTY  1986   VAGINAL HYSTERECTOMY  1980   ovaries left in place   WRIST SURGERY  1970   cyst  removal   Family History  Problem Relation Age of Onset   Heart disease Father        heart attack   Diabetes Father    Hyperlipidemia Paternal Grandmother    Diabetes Paternal Grandmother    Stroke Paternal Grandmother    Hyperlipidemia Paternal Grandfather    Diabetes Paternal Grandfather    Prostate cancer Maternal Uncle    Pancreatic cancer Cousin    Multiple myeloma Brother    Breast cancer Maternal Aunt    Breast cancer Maternal Aunt    Social History   Socioeconomic History   Marital status: Married    Spouse name: Not on file   Number of children: Not on file   Years of education: Not on file   Highest education level: Not on file  Occupational History   Not on file  Tobacco Use   Smoking status: Former    Years: 5.00    Types: Cigarettes   Smokeless tobacco: Never  Vaping Use   Vaping Use: Never used  Substance and Sexual Activity   Alcohol use: Yes    Alcohol/week: 0.0 standard drinks    Comment: rare   Drug use: No   Sexual activity: Yes  Other Topics Concern   Not on file  Social History Narrative   Not on  file   Social Determinants of Health   Financial Resource Strain: Low Risk    Difficulty of Paying Living Expenses: Not hard at all  Food Insecurity: No Food Insecurity   Worried About Charity fundraiser in the Last Year: Never true   Arboriculturist in the Last Year: Never true  Transportation Needs: No Transportation Needs   Lack of Transportation (Medical): No   Lack of Transportation (Non-Medical): No  Physical Activity: Insufficiently Active   Days of Exercise per Week: 2 days   Minutes of Exercise per Session: 60 min  Stress: No Stress Concern Present   Feeling of Stress : Not at all  Social Connections: Unknown   Frequency of Communication with Friends and Family: More than three times a week   Frequency of Social Gatherings with Friends and Family: Not on file   Attends Religious Services: Not on file   Active Member of Clubs or  Organizations: Not on file   Attends Archivist Meetings: Not on file   Marital Status: Married     Review of Systems  Constitutional:  Negative for appetite change and unexpected weight change.  HENT:  Negative for congestion, sinus pressure and sore throat.   Eyes:  Negative for pain and visual disturbance.  Respiratory:  Negative for cough, chest tightness and shortness of breath.   Cardiovascular:  Negative for chest pain, palpitations and leg swelling.  Gastrointestinal:  Negative for abdominal pain, diarrhea, nausea and vomiting.  Genitourinary:  Negative for difficulty urinating and dysuria.  Musculoskeletal:  Negative for joint swelling and myalgias.       Joint pain as outlined.   Skin:  Negative for color change and rash.  Neurological:  Negative for dizziness, light-headedness and headaches.  Hematological:  Negative for adenopathy. Does not bruise/bleed easily.  Psychiatric/Behavioral:  Negative for agitation and dysphoric mood.       Objective:     BP 124/70    Pulse 70    Ht 5' 3.39" (1.61 m)    Wt 142 lb (64.4 kg)    SpO2 98%    BMI 24.85 kg/m  Wt Readings from Last 3 Encounters:  06/18/21 142 lb (64.4 kg)  03/15/21 135 lb (61.2 kg)  02/22/21 141 lb 12.8 oz (64.3 kg)    Physical Exam Vitals reviewed.  Constitutional:      General: She is not in acute distress.    Appearance: Normal appearance. She is well-developed.  HENT:     Head: Normocephalic and atraumatic.     Comments: Some increased pain with palpation - angle of jaw.      Right Ear: Ear canal and external ear normal.     Left Ear: Tympanic membrane, ear canal and external ear normal.  Eyes:     General: No scleral icterus.       Right eye: No discharge.        Left eye: No discharge.     Conjunctiva/sclera: Conjunctivae normal.  Neck:     Thyroid: No thyromegaly.  Cardiovascular:     Rate and Rhythm: Normal rate and regular rhythm.  Pulmonary:     Effort: No tachypnea, accessory  muscle usage or respiratory distress.     Breath sounds: Normal breath sounds. No decreased breath sounds, wheezing or rhonchi.  Chest:  Breasts:    Right: No inverted nipple, mass, nipple discharge or tenderness (no axillary adenopathy).     Left: No inverted nipple, mass, nipple discharge or  tenderness (no axilarry adenopathy).  Abdominal:     General: Bowel sounds are normal.     Palpations: Abdomen is soft.     Tenderness: There is no abdominal tenderness.  Musculoskeletal:        General: No swelling or tenderness.     Cervical back: Neck supple. No tenderness.  Lymphadenopathy:     Cervical: No cervical adenopathy.  Skin:    General: Skin is warm.     Findings: No erythema or rash.  Neurological:     Mental Status: She is alert and oriented to person, place, and time.  Psychiatric:        Mood and Affect: Mood normal.        Behavior: Behavior normal.     Outpatient Encounter Medications as of 06/18/2021  Medication Sig   acetaminophen (TYLENOL) 500 MG tablet Take 500 mg by mouth every 6 (six) hours as needed.   acyclovir (ZOVIRAX) 800 MG tablet Take 1 tablet (800 mg total) by mouth 2 (two) times daily.   Ascorbic Acid (VITAMIN C) 500 MG CAPS Take 500 mg by mouth daily.   budesonide (PULMICORT) 1 MG/2ML nebulizer solution ADD 1ML OF MEDICATION TO 240ML OF SALINE IN SALINE IRRIGATION BOTTLE; IRRIGATE SINUSES WITH 120ML THROUGH EACH NOSTRIL TWICE DAILY   BUDESONIDE PO 2 (two) times daily.   busPIRone (BUSPAR) 5 MG tablet Take 5 mg by mouth daily as needed.   Cholecalciferol (VITAMIN D PO) Take 10,000 Units by mouth daily.    Coenzyme Q10 (COQ-10 PO) Take 120 mg by mouth daily.   COLLAGEN PO Take 11 mg by mouth daily.   Cyanocobalamin (B-12 PO) Take 5,000 mcg by mouth daily.   diclofenac sodium (VOLTAREN) 1 % GEL Apply 1 application topically as needed.   ELDERBERRY PO Take 125 mg by mouth daily.    fluocinonide cream (LIDEX) 5.57 % Apply 1 application topically 2 (two) times  daily as needed.    Hypertonic Nasal Wash (SINUS RINSE NA) Place into the nose as needed.   LEVOFLOXACIN PO 2 (two) times daily. levofloxacin 174m/ml cash  ADD 1ML OF MEDICATION TO 240ML OF SALINE IN SALINE IRRIGATION BOTTLE; IRRIGATE   levothyroxine (SYNTHROID) 50 MCG tablet Take 1 tablet (50 mcg total) by mouth every other day.   levothyroxine (SYNTHROID) 75 MCG tablet Take 1 tablet (75 mcg total) by mouth every other day.   NON FORMULARY DE 3 Omega Benefits TID   Omega-3 Fatty Acids (FISH OIL PO) Take 1,200 mg by mouth daily.   omeprazole (PRILOSEC) 40 MG capsule Take 1 capsule (40 mg total) by mouth daily.   Probiotic Product (PROBIOTIC DAILY PO) Take 1 capsule by mouth.   sodium chloride irrigation 0.9 % irrigation sodium chloride 0.9 % irrigation solution   UNABLE TO FIND Take 3,326 mg by mouth 2 (two) times daily as needed. Med Name: CBD/CBG SWISH CELLULAR NUTRITION 3326 MG   vitamin E 400 UNIT capsule Take 400 Units by mouth daily.   [DISCONTINUED] rosuvastatin (CRESTOR) 20 MG tablet Take 235mby mouth daily on Monday, Wednesday and Friday and 10 mg by mouth all other days.   rosuvastatin (CRESTOR) 20 MG tablet Take 1 tablet (20 mg total) by mouth daily.   [DISCONTINUED] amoxicillin-clavulanate (AUGMENTIN) 875-125 MG tablet Take 1 tablet by mouth 2 (two) times daily. (Patient not taking: Reported on 06/18/2021)   [DISCONTINUED] benzonatate (TESSALON) 100 MG capsule Take 1 capsule (100 mg total) by mouth 3 (three) times daily as needed for cough. (  Patient not taking: Reported on 06/18/2021)   No facility-administered encounter medications on file as of 06/18/2021.     Lab Results  Component Value Date   WBC 4.5 10/19/2020   HGB 13.3 10/19/2020   HCT 38.3 10/19/2020   PLT 226.0 10/19/2020   GLUCOSE 90 06/16/2021   CHOL 175 06/16/2021   TRIG 73.0 06/16/2021   HDL 61.50 06/16/2021   LDLDIRECT 123.4 02/27/2013   LDLCALC 99 06/16/2021   ALT 16 06/16/2021   AST 19 06/16/2021   NA 140  06/16/2021   K 4.1 06/16/2021   CL 104 06/16/2021   CREATININE 0.68 06/16/2021   BUN 16 06/16/2021   CO2 30 06/16/2021   TSH 1.87 10/19/2020   HGBA1C 5.7 06/16/2021    DG Bone Density  Result Date: 04/08/2021 EXAM: DUAL X-RAY ABSORPTIOMETRY (DXA) FOR BONE MINERAL DENSITY IMPRESSION: Your patient JACOBA CHERNEY completed a FRAX assessment on 04/08/2021 using the [place] (analysis version: 14.10) manufactured by [employer]. The following summarizes the results of our evaluation. PATIENT BIOGRAPHICAL: Name: Cindee, Mclester Patient ID: [id number] Birth Date: 08/23/1945 Height:    63.0 in. Gender:     [age]    [age]       Weight:    139.7 lbs. Ethnicity:  White                            Exam Date: 04/08/2021 FRAX* RESULTS:  (version: 3.5) 10-year Probability of Fracture1 Major Osteoporotic Fracture2 Hip Fracture 23.1% 6.5% Population: [place] (Caucasian) Risk Factors: History of Fracture (Adult) Based on Femur (Right) Neck BMD 1 -The 10-year probability of fracture may be lower than reported if the patient has received treatment. 2 -Major Osteoporotic Fracture: Clinical Spine, Forearm, Hip or Shoulder *FRAX is a Materials engineer of the [employer] for Metabolic Bone Disease, a [place] (WHO) [facility]. ASSESSMENT: The probability of a major osteoporotic fracture is 23.1% within the next ten years. The probability of a hip fracture is 6.5% within the next ten years. . Your patient Melenie Minniear completed a BMD test on 04/08/2021 using the [place] (software version: 14.10) manufactured by [employer]. The following summarizes the results of our evaluation. Technologist: crr PATIENT BIOGRAPHICAL: Name: Mikea, Quadros Patient ID: [id number] Birth Date: 02-20-1946 Height: 63.0 in. Gender: Female Exam Date: 04/08/2021 Weight: 139.7 lbs. Indications: Advanced Age, Caucasian, Early Menopause, Height Loss,  History of Fracture (Adult), hx skin ca, Hypothyroid, Hysterectomy, left knee replacement, Low Body Weight, Postmenopausal Fractures: Left foot, Left lower leg, Right ankle, Right foot Treatments: Levothyroxine, Vitamin D DENSITOMETRY RESULTS: Site      Region     Measured Date Measured Age WHO Classification Young Adult T-score BMD         %Change vs. Previous Significant Change (*) AP Spine L2-L4 04/08/2020 76.0 Osteopenia -1.8 0.986 g/cm2 1.9% - AP Spine L2-L4 07/06/2015 70.3 Osteopenia -2.0 0.968 g/cm2 - - DualFemur Neck Right 04/08/2021 76.0 Osteopenia -2.2 0.725 g/cm2 -4.4% - DualFemur Neck Right 07/06/2015 70.3 Osteopenia -2.0 0.758 g/cm2 - - DualFemur Total Mean 04/08/2021 76.0 Osteopenia -1.5 0.817 g/cm2 -0.5% - DualFemur Total Mean 07/06/2015 70.3 Osteopenia -1.5 0.821 g/cm2 - - ASSESSMENT: The BMD measured at Femur Neck Right is 0.725 g/cm2 with a T-score of -2.2. This patient is considered osteopenic according to [facility]) criteria. L-1  was excluded due to degenerative changes. The scan quality is limited by exclusion of L-1. World Pharmacologist [facility]) criteria for post-menopausal, Caucasian Women: Normal:                   T-score at or above -1 SD Osteopenia/low bone mass: T-score between -1 and -2.5 SD Osteoporosis:             T-score at or below -2.5 SD RECOMMENDATIONS: 1. All patients should optimize calcium and vitamin D intake. 2. Consider FDA-approved medical therapies in postmenopausal women and men aged 25 years and older, based on the following: a. A hip or vertebral(clinical or morphometric) fracture b. T-score < -2.5 at the femoral neck or spine after appropriate evaluation to exclude secondary causes c. Low bone mass (T-score between -1.0 and -2.5 at the femoral neck or spine) and a 10-year probability of a hip fracture > 3% or a 10-year probability of a major osteoporosis-related fracture > 20% based on the [place]-adapted WHO algorithm 3. Clinician judgment and/or  patient preferences may indicate treatment for people with 10-year fracture probabilities above or below these levels FOLLOW-UP: People with diagnosed cases of osteoporosis or at high risk for fracture should have regular bone mineral density tests. For patients eligible for Medicare, routine testing is allowed once every 2 years. The testing frequency can be increased to one year for patients who have rapidly progressing disease, those who are receiving or discontinuing medical therapy to restore bone mass, or have additional risk factors. I have reviewed this report, and agree with the above findings. [facility] Electronically Signed   By: Rolm Baptise M.D.   On: 04/08/2021 20:54   MM 3D SCREEN BREAST BILATERAL  Result Date: 04/09/2021 CLINICAL DATA:  Screening. EXAM: DIGITAL SCREENING BILATERAL MAMMOGRAM WITH TOMOSYNTHESIS AND CAD TECHNIQUE: Bilateral screening digital craniocaudal and mediolateral oblique mammograms were obtained. Bilateral screening digital breast tomosynthesis was performed. The images were evaluated with computer-aided detection. COMPARISON:  Previous exam(s). ACR Breast Density Category b: There are scattered areas of fibroglandular density. FINDINGS: There are no findings suspicious for malignancy. IMPRESSION: No mammographic evidence of malignancy. A result letter of this screening mammogram will be mailed directly to the patient. RECOMMENDATION: Screening mammogram in one year. (Code:SM-B-01Y) BI-RADS CATEGORY  1: Negative. Electronically Signed   By: Dorise Bullion III M.D.   On: 04/09/2021 18:33      Assessment & Plan:   Problem List Items Addressed This Visit     Aortic atherosclerosis ([place])    Continue crestor.       Relevant Medications   rosuvastatin (CRESTOR) 20 MG tablet   Carotid artery disease ([facility])    Carotid ultrasound - right: 40-59% and left:  1-39%.  Continue statin therapy.  Will need f/u carotid ultrasound.       Relevant  Medications   rosuvastatin (CRESTOR) 20 MG tablet   GERD (gastroesophageal reflux disease)    S/p EGD 11/2019.  Symptoms controlled on prilosec.       Health care maintenance    Physical today 06/18/21.  Colonoscopy 01/2018.  Recommended f/u in 5 years.  Mammogram 04/08/21 - Briads I.  Bone density 03/2021 - osteopenia.  Continue calcium, vitamin D and weight bearing exercise.        History of colonic polyps    Colonoscopy 01/2018.  Per pt, f/u colonoscopy recommended in 5 years.       Hypercholesteremia    On crestor.  Low cholesterol diet and  exercise.  Follow lipid panel and liver function tests.        Relevant Medications   rosuvastatin (CRESTOR) 20 MG tablet   Other Relevant Orders   Basic metabolic panel   Lipid panel   Hepatic function panel   CBC with Differential/Platelet   Hyperglycemia    Low carb diet and exercise.  Follow met b and a1c.       Relevant Orders   Hemoglobin A1c   Hypothyroidism    On thyroid replacement.  Follow tsh. Recent tsh (10/19/20) - wnl.       Relevant Orders   TSH   Joint pain    Joint pain - varying joints.  OA changes - fingers.  Discussed further evaluation.  Stay active.  Follow.        Relevant Orders   Sedimentation rate   Rheumatoid factor   ANA   Numbness of finger    Involves tip of left first finger.  No weakness.  Positional.  Discussed further w/up.  Will notify me if desires further evaluation.       Stress    Increased stress as outlined.  Discussed.  Has buspar if needed.  Does not feel needs any further intervention at this time.  Follow. Discussed could be aggravating TMJ.  Follow.       TMJ arthralgia    Symptoms and exam c/w TMJ.  Continue mouth guard.  Treat stress.  Tylenol.  Follow.        Other Visit Diagnoses     Routine general medical examination at a health care facility    -  Primary        Einar Pheasant, MD

## 2021-06-20 ENCOUNTER — Encounter: Payer: Self-pay | Admitting: Internal Medicine

## 2021-06-20 DIAGNOSIS — M26629 Arthralgia of temporomandibular joint, unspecified side: Secondary | ICD-10-CM | POA: Insufficient documentation

## 2021-06-20 DIAGNOSIS — M2550 Pain in unspecified joint: Secondary | ICD-10-CM | POA: Insufficient documentation

## 2021-06-20 DIAGNOSIS — R200 Anesthesia of skin: Secondary | ICD-10-CM | POA: Insufficient documentation

## 2021-06-20 NOTE — Assessment & Plan Note (Signed)
Joint pain - varying joints.  OA changes - fingers.  Discussed further evaluation.  Stay active.  Follow.

## 2021-06-20 NOTE — Assessment & Plan Note (Signed)
Symptoms and exam c/w TMJ.  Continue mouth guard.  Treat stress.  Tylenol.  Follow.

## 2021-06-20 NOTE — Assessment & Plan Note (Signed)
Physical today 06/18/21.  Colonoscopy 01/2018.  Recommended f/u in 5 years.  Mammogram 04/08/21 - Briads I.  Bone density 03/2021 - osteopenia.  Continue calcium, vitamin D and weight bearing exercise.

## 2021-06-20 NOTE — Assessment & Plan Note (Signed)
Carotid ultrasound - right: 40-59% and left:  1-39%.  Continue statin therapy.  Will need f/u carotid ultrasound.

## 2021-06-20 NOTE — Assessment & Plan Note (Signed)
Colonoscopy 01/2018.  Per pt, f/u colonoscopy recommended in 5 years.  

## 2021-06-20 NOTE — Assessment & Plan Note (Signed)
Low carb diet and exercise.  Follow met b and a1c.

## 2021-06-20 NOTE — Assessment & Plan Note (Signed)
S/p EGD 11/2019.  Symptoms controlled on prilosec.

## 2021-06-20 NOTE — Assessment & Plan Note (Signed)
On crestor.  Low cholesterol diet and exercise.  Follow lipid panel and liver function tests.   

## 2021-06-20 NOTE — Assessment & Plan Note (Signed)
Involves tip of left first finger.  No weakness.  Positional.  Discussed further w/up.  Will notify me if desires further evaluation.

## 2021-06-20 NOTE — Assessment & Plan Note (Signed)
On thyroid replacement.  Follow tsh. Recent tsh (10/19/20) - wnl.  

## 2021-06-20 NOTE — Assessment & Plan Note (Signed)
Increased stress as outlined.  Discussed.  Has buspar if needed.  Does not feel needs any further intervention at this time.  Follow. Discussed could be aggravating TMJ.  Follow.

## 2021-06-20 NOTE — Assessment & Plan Note (Signed)
Continue crestor 

## 2021-07-07 ENCOUNTER — Other Ambulatory Visit: Payer: Self-pay | Admitting: Internal Medicine

## 2021-07-20 DIAGNOSIS — M5412 Radiculopathy, cervical region: Secondary | ICD-10-CM | POA: Diagnosis not present

## 2021-07-20 DIAGNOSIS — M9901 Segmental and somatic dysfunction of cervical region: Secondary | ICD-10-CM | POA: Diagnosis not present

## 2021-07-20 DIAGNOSIS — M9903 Segmental and somatic dysfunction of lumbar region: Secondary | ICD-10-CM | POA: Diagnosis not present

## 2021-07-20 DIAGNOSIS — M5033 Other cervical disc degeneration, cervicothoracic region: Secondary | ICD-10-CM | POA: Diagnosis not present

## 2021-08-10 DIAGNOSIS — Z86010 Personal history of colonic polyps: Secondary | ICD-10-CM | POA: Diagnosis not present

## 2021-08-10 DIAGNOSIS — H00025 Hordeolum internum left lower eyelid: Secondary | ICD-10-CM | POA: Diagnosis not present

## 2021-08-10 DIAGNOSIS — K219 Gastro-esophageal reflux disease without esophagitis: Secondary | ICD-10-CM | POA: Diagnosis not present

## 2021-08-10 DIAGNOSIS — H02889 Meibomian gland dysfunction of unspecified eye, unspecified eyelid: Secondary | ICD-10-CM | POA: Diagnosis not present

## 2021-08-10 DIAGNOSIS — K449 Diaphragmatic hernia without obstruction or gangrene: Secondary | ICD-10-CM | POA: Diagnosis not present

## 2021-08-10 DIAGNOSIS — K5730 Diverticulosis of large intestine without perforation or abscess without bleeding: Secondary | ICD-10-CM | POA: Diagnosis not present

## 2021-10-05 ENCOUNTER — Other Ambulatory Visit: Payer: Self-pay | Admitting: Internal Medicine

## 2021-10-08 DIAGNOSIS — D0439 Carcinoma in situ of skin of other parts of face: Secondary | ICD-10-CM | POA: Diagnosis not present

## 2021-10-08 DIAGNOSIS — D225 Melanocytic nevi of trunk: Secondary | ICD-10-CM | POA: Diagnosis not present

## 2021-10-08 DIAGNOSIS — L821 Other seborrheic keratosis: Secondary | ICD-10-CM | POA: Diagnosis not present

## 2021-10-08 DIAGNOSIS — L570 Actinic keratosis: Secondary | ICD-10-CM | POA: Diagnosis not present

## 2021-10-08 DIAGNOSIS — D1801 Hemangioma of skin and subcutaneous tissue: Secondary | ICD-10-CM | POA: Diagnosis not present

## 2021-10-08 DIAGNOSIS — L820 Inflamed seborrheic keratosis: Secondary | ICD-10-CM | POA: Diagnosis not present

## 2021-10-08 DIAGNOSIS — L814 Other melanin hyperpigmentation: Secondary | ICD-10-CM | POA: Diagnosis not present

## 2021-10-11 ENCOUNTER — Other Ambulatory Visit: Payer: Self-pay | Admitting: Internal Medicine

## 2021-10-18 ENCOUNTER — Other Ambulatory Visit (INDEPENDENT_AMBULATORY_CARE_PROVIDER_SITE_OTHER): Payer: Medicare HMO

## 2021-10-18 DIAGNOSIS — R739 Hyperglycemia, unspecified: Secondary | ICD-10-CM | POA: Diagnosis not present

## 2021-10-18 DIAGNOSIS — M2550 Pain in unspecified joint: Secondary | ICD-10-CM | POA: Diagnosis not present

## 2021-10-18 DIAGNOSIS — E7800 Pure hypercholesterolemia, unspecified: Secondary | ICD-10-CM

## 2021-10-18 DIAGNOSIS — E039 Hypothyroidism, unspecified: Secondary | ICD-10-CM | POA: Diagnosis not present

## 2021-10-18 LAB — CBC WITH DIFFERENTIAL/PLATELET
Basophils Absolute: 0 10*3/uL (ref 0.0–0.1)
Basophils Relative: 0.4 % (ref 0.0–3.0)
Eosinophils Absolute: 0.1 10*3/uL (ref 0.0–0.7)
Eosinophils Relative: 1 % (ref 0.0–5.0)
HCT: 36.6 % (ref 36.0–46.0)
Hemoglobin: 12.5 g/dL (ref 12.0–15.0)
Lymphocytes Relative: 21.2 % (ref 12.0–46.0)
Lymphs Abs: 1.3 10*3/uL (ref 0.7–4.0)
MCHC: 34.2 g/dL (ref 30.0–36.0)
MCV: 96.9 fl (ref 78.0–100.0)
Monocytes Absolute: 0.5 10*3/uL (ref 0.1–1.0)
Monocytes Relative: 7.8 % (ref 3.0–12.0)
Neutro Abs: 4.1 10*3/uL (ref 1.4–7.7)
Neutrophils Relative %: 69.6 % (ref 43.0–77.0)
Platelets: 219 10*3/uL (ref 150.0–400.0)
RBC: 3.78 Mil/uL — ABNORMAL LOW (ref 3.87–5.11)
RDW: 12.7 % (ref 11.5–15.5)
WBC: 6 10*3/uL (ref 4.0–10.5)

## 2021-10-18 LAB — LIPID PANEL
Cholesterol: 178 mg/dL (ref 0–200)
HDL: 65.9 mg/dL
LDL Cholesterol: 96 mg/dL (ref 0–99)
NonHDL: 112.3
Total CHOL/HDL Ratio: 3
Triglycerides: 83 mg/dL (ref 0.0–149.0)
VLDL: 16.6 mg/dL (ref 0.0–40.0)

## 2021-10-18 LAB — HEPATIC FUNCTION PANEL
ALT: 17 U/L (ref 0–35)
AST: 19 U/L (ref 0–37)
Albumin: 4.4 g/dL (ref 3.5–5.2)
Alkaline Phosphatase: 57 U/L (ref 39–117)
Bilirubin, Direct: 0.1 mg/dL (ref 0.0–0.3)
Total Bilirubin: 0.5 mg/dL (ref 0.2–1.2)
Total Protein: 6.8 g/dL (ref 6.0–8.3)

## 2021-10-18 LAB — BASIC METABOLIC PANEL WITH GFR
BUN: 14 mg/dL (ref 6–23)
CO2: 30 meq/L (ref 19–32)
Calcium: 9.7 mg/dL (ref 8.4–10.5)
Chloride: 105 meq/L (ref 96–112)
Creatinine, Ser: 0.62 mg/dL (ref 0.40–1.20)
GFR: 86.39 mL/min
Glucose, Bld: 89 mg/dL (ref 70–99)
Potassium: 4.5 meq/L (ref 3.5–5.1)
Sodium: 142 meq/L (ref 135–145)

## 2021-10-18 LAB — SEDIMENTATION RATE: Sed Rate: 1 mm/h (ref 0–30)

## 2021-10-18 LAB — HEMOGLOBIN A1C: Hgb A1c MFr Bld: 5.5 % (ref 4.6–6.5)

## 2021-10-19 LAB — TSH: TSH: 2.3 u[IU]/mL (ref 0.35–5.50)

## 2021-10-20 ENCOUNTER — Encounter: Payer: Self-pay | Admitting: Internal Medicine

## 2021-10-20 ENCOUNTER — Ambulatory Visit (INDEPENDENT_AMBULATORY_CARE_PROVIDER_SITE_OTHER): Payer: Medicare HMO | Admitting: Internal Medicine

## 2021-10-20 DIAGNOSIS — K2100 Gastro-esophageal reflux disease with esophagitis, without bleeding: Secondary | ICD-10-CM | POA: Diagnosis not present

## 2021-10-20 DIAGNOSIS — Z860100 Personal history of colon polyps, unspecified: Secondary | ICD-10-CM

## 2021-10-20 DIAGNOSIS — J301 Allergic rhinitis due to pollen: Secondary | ICD-10-CM | POA: Diagnosis not present

## 2021-10-20 DIAGNOSIS — F439 Reaction to severe stress, unspecified: Secondary | ICD-10-CM | POA: Diagnosis not present

## 2021-10-20 DIAGNOSIS — E7800 Pure hypercholesterolemia, unspecified: Secondary | ICD-10-CM | POA: Diagnosis not present

## 2021-10-20 DIAGNOSIS — E039 Hypothyroidism, unspecified: Secondary | ICD-10-CM

## 2021-10-20 DIAGNOSIS — M79671 Pain in right foot: Secondary | ICD-10-CM

## 2021-10-20 DIAGNOSIS — I779 Disorder of arteries and arterioles, unspecified: Secondary | ICD-10-CM | POA: Diagnosis not present

## 2021-10-20 DIAGNOSIS — M79672 Pain in left foot: Secondary | ICD-10-CM

## 2021-10-20 DIAGNOSIS — I700 Atherosclerosis of aorta: Secondary | ICD-10-CM | POA: Diagnosis not present

## 2021-10-20 DIAGNOSIS — R69 Illness, unspecified: Secondary | ICD-10-CM | POA: Diagnosis not present

## 2021-10-20 DIAGNOSIS — R739 Hyperglycemia, unspecified: Secondary | ICD-10-CM

## 2021-10-20 NOTE — Progress Notes (Signed)
Patient ID: SHARICKA POGORZELSKI, female   DOB: 1945/11/29, 76 y.o.   MRN: [id number]   Subjective:    Patient ID: [name], female    DOB: [birth year], 76 y.o.   MRN: [id number]   Patient here for a scheduled follow up.   Chief Complaint  Patient presents with   Follow-up    Follow up    Hyperlipidemia   Gastroesophageal Reflux   .   HPI Reports some intermittent light headedness.  Notices more in am.  No headache.  Worsens with position changes.  Taking allergy medication daily.  Has noticed some sinus pressure. Some yellow mucus at times.  Minimal chest congestion.  Using saline nasal rinse.  No chest pain or sob reported.  Acid reflux ok now.  Had questions about PPI - nexium vs omeprazole.  Sees dermatology yearly.  States f/u squamous cell CA.  Also with bone spurs - feet.  Request referral to podiatry.     Past Medical History:  Diagnosis Date   Arthritis    Environmental allergies    Esophagitis    Barrets, followed by Dr Vennie Homans   GERD (gastroesophageal reflux disease)    Hypercholesterolemia    Hypothyroidism    Past Surgical History:  Procedure Laterality Date   ANKLE SURGERY  1967   KNEE ARTHROSCOPY  12/03/07   left   KNEE ARTHROSCOPY  06/25/08   left   NASAL SINUS SURGERY  12/06   REPLACEMENT TOTAL KNEE  01/04/09   left   RHINOPLASTY  1986   VAGINAL HYSTERECTOMY  1980   ovaries left in place   WRIST SURGERY  1970   cyst removal   Family History  Problem Relation Age of Onset   Heart disease Father        heart attack   Diabetes Father    Hyperlipidemia Paternal Grandmother    Diabetes Paternal Grandmother    Stroke Paternal Grandmother    Hyperlipidemia Paternal Grandfather    Diabetes Paternal Grandfather    Prostate cancer Maternal Uncle    Pancreatic cancer Cousin    Multiple myeloma Brother    Breast cancer Maternal Aunt    Breast cancer Maternal Aunt    Social History   Socioeconomic History   Marital status: Married    Spouse name: Not  on file   Number of children: Not on file   Years of education: Not on file   Highest education level: Not on file  Occupational History   Not on file  Tobacco Use   Smoking status: Former    Years: 5.00    Types: Cigarettes   Smokeless tobacco: Never  Vaping Use   Vaping Use: Never used  Substance and Sexual Activity   Alcohol use: Yes    Alcohol/week: 0.0 standard drinks    Comment: rare   Drug use: No   Sexual activity: Yes  Other Topics Concern   Not on file  Social History Narrative   Not on file   Social Determinants of Health   Financial Resource Strain: Low Risk    Difficulty of Paying Living Expenses: Not hard at all  Food Insecurity: No Food Insecurity   Worried About Charity fundraiser in the Last Year: Never true   [place] in the Last Year: Never true  Transportation Needs: No Transportation Needs   Lack of Transportation (Medical): No   Lack of Transportation (Non-Medical): No  Physical Activity: Insufficiently Active   Days  of Exercise per Week: 2 days   Minutes of Exercise per Session: 60 min  Stress: No Stress Concern Present   Feeling of Stress : Not at all  Social Connections: Unknown   Frequency of Communication with Friends and Family: More than three times a week   Frequency of Social Gatherings with Friends and Family: Not on file   Attends Religious Services: Not on file   Active Member of Clubs or Organizations: Not on file   Attends Archivist Meetings: Not on file   Marital Status: Married     Review of Systems  Constitutional:  Negative for appetite change and unexpected weight change.  HENT:  Positive for congestion and postnasal drip. Negative for sinus pressure.   Respiratory:  Negative for cough, chest tightness and shortness of breath.   Cardiovascular:  Negative for chest pain, palpitations and leg swelling.  Gastrointestinal:  Negative for abdominal pain, diarrhea, nausea and vomiting.  Genitourinary:   Negative for difficulty urinating and dysuria.  Musculoskeletal:  Negative for joint swelling and myalgias.  Skin:  Negative for color change and rash.  Neurological:  Positive for dizziness and light-headedness. Negative for headaches.  Psychiatric/Behavioral:  Negative for agitation and dysphoric mood.       Objective:     BP 116/68 (BP Location: Left Arm, Patient Position: Sitting, Cuff Size: Small)   Pulse 64   Temp 98 F (36.7 C) (Temporal)   Resp 17   Ht _0  (1.6 m)   Wt 146 lb 6.4 oz (66.4 kg)   HC 17" (43.2 cm)   SpO2 99%   BMI 25.93 kg/m  Wt Readings from Last 3 Encounters:  10/20/21 146 lb 6.4 oz (66.4 kg)  06/18/21 142 lb (64.4 kg)  03/15/21 135 lb (61.2 kg)    Physical Exam Vitals reviewed.  Constitutional:      General: She is not in acute distress.    Appearance: Normal appearance.  HENT:     Head: Normocephalic and atraumatic.     Right Ear: External ear normal.     Left Ear: External ear normal.  Eyes:     General: No scleral icterus.       Right eye: No discharge.        Left eye: No discharge.     Conjunctiva/sclera: Conjunctivae normal.  Neck:     Thyroid: No thyromegaly.  Cardiovascular:     Rate and Rhythm: Normal rate and regular rhythm.  Pulmonary:     Effort: No respiratory distress.     Breath sounds: Normal breath sounds. No wheezing.  Abdominal:     General: Bowel sounds are normal.     Palpations: Abdomen is soft.     Tenderness: There is no abdominal tenderness.  Musculoskeletal:        General: No swelling or tenderness.     Cervical back: Neck supple. No tenderness.  Lymphadenopathy:     Cervical: No cervical adenopathy.  Skin:    Findings: No erythema or rash.  Neurological:     Mental Status: She is alert.  Psychiatric:        Mood and Affect: Mood normal.        Behavior: Behavior normal.     Outpatient Encounter Medications as of 10/20/2021  Medication Sig   acetaminophen (TYLENOL) 500 MG tablet Take 500 mg by  mouth every 6 (six) hours as needed.   Ascorbic Acid (VITAMIN C) 500 MG CAPS Take 500 mg by mouth daily.  B Complex Vitamins (VITAMIN B-COMPLEX) TABS Take 1 tablet by mouth daily.   BUDESONIDE PO 2 (two) times daily.   busPIRone (BUSPAR) 5 MG tablet Take 5 mg by mouth daily as needed.   Cholecalciferol (VITAMIN D PO) Take 10,000 Units by mouth daily.    Coenzyme Q10 (COQ-10 PO) Take 120 mg by mouth daily.   COLLAGEN PO Take 11 mg by mouth daily.   diclofenac sodium (VOLTAREN) 1 % GEL Apply 1 application topically as needed.   ELDERBERRY PO Take 125 mg by mouth daily.    fluocinonide cream (LIDEX) 2.77 % Apply 1 application topically 2 (two) times daily as needed.    HYDROMORPHONE HCL PO Take by mouth.   Hypertonic Nasal Wash (SINUS RINSE NA) Place into the nose as needed.   LEVOFLOXACIN PO 2 (two) times daily. levofloxacin 1107m/ml cash  ADD 1ML OF MEDICATION TO 240ML OF SALINE IN SALINE IRRIGATION BOTTLE; IRRIGATE   levothyroxine (SYNTHROID) 50 MCG tablet TAKE 1 TABLET EVERY OTHER  DAY   levothyroxine (SYNTHROID) 75 MCG tablet TAKE 1 TABLET EVERY OTHER  DAY   NON FORMULARY DE 3 Omega Benefits TID   Omega-3 Fatty Acids (FISH OIL PO) Take 1,200 mg by mouth daily.   omeprazole (PRILOSEC) 40 MG capsule TAKE 1 CAPSULE DAILY   oxyCODONE-acetaminophen (PERCOCET/ROXICET) 5-325 MG tablet Take by mouth every 4 (four) hours as needed for severe pain.   Probiotic Product (PROBIOTIC DAILY PO) Take 1 capsule by mouth.   sodium chloride irrigation 0.9 % irrigation sodium chloride 0.9 % irrigation solution   traMADol (ULTRAM) 50 MG tablet Take by mouth every 6 (six) hours as needed.   vitamin E 400 UNIT capsule Take 400 Units by mouth daily.   [DISCONTINUED] rosuvastatin (CRESTOR) 20 MG tablet Take 1 tablet (20 mg total) by mouth daily.   UNABLE TO FIND Take 3,326 mg by mouth 2 (two) times daily as needed. Med Name: CBD/CBG SWISH CELLULAR NUTRITION 3326 MG   [DISCONTINUED] acyclovir (ZOVIRAX) 800 MG  tablet Take 1 tablet (800 mg total) by mouth 2 (two) times daily. (Patient not taking: Reported on 10/20/2021)   [DISCONTINUED] budesonide (PULMICORT) 1 MG/2ML nebulizer solution ADD 1ML OF MEDICATION TO 240ML OF SALINE IN SALINE IRRIGATION BOTTLE; IRRIGATE SINUSES WITH 120ML THROUGH EACH NOSTRIL TWICE DAILY (Patient not taking: Reported on 10/20/2021)   [DISCONTINUED] Cyanocobalamin (B-12 PO) Take 5,000 mcg by mouth daily. (Patient not taking: Reported on 10/20/2021)   No facility-administered encounter medications on file as of 10/20/2021.     Lab Results  Component Value Date   WBC 6.0 10/18/2021   HGB 12.5 10/18/2021   HCT 36.6 10/18/2021   PLT 219.0 10/18/2021   GLUCOSE 89 10/18/2021   CHOL 178 10/18/2021   TRIG 83.0 10/18/2021   HDL 65.90 10/18/2021   LDLDIRECT 123.4 02/27/2013   LDLCALC 96 10/18/2021   ALT 17 10/18/2021   AST 19 10/18/2021   NA 142 10/18/2021   K 4.5 10/18/2021   CL 105 10/18/2021   CREATININE 0.62 10/18/2021   BUN 14 10/18/2021   CO2 30 10/18/2021   TSH 2.30 10/18/2021   HGBA1C 5.5 10/18/2021    DG Bone Density  Result Date: 04/08/2021 EXAM: DUAL X-RAY ABSORPTIOMETRY (DXA) FOR BONE MINERAL DENSITY IMPRESSION: Your patient NWYATT GALVANcompleted a FRAX assessment on 04/08/2021 using the [employer](analysis version: 14.10) manufactured by [employer] The following summarizes the results of our evaluation. PATIENT BIOGRAPHICAL: Name: EBridgit, EynonPatient ID: [id number]  Birth Date: Sep 27, 1945 Height:    63.0 in. Gender:     Female    [age]       Weight:    139.7 lbs. Ethnicity:  White                            Exam Date: 04/08/2021 FRAX* RESULTS:  (version: 3.5) 10-year Probability of Fracture1 Major Osteoporotic Fracture2 Hip Fracture 23.1% 6.5% Population: [place] (Caucasian) Risk Factors: History of Fracture (Adult) Based on Femur (Right) Neck BMD 1 -The 10-year probability of fracture may be lower than reported if the patient has  received treatment. 2 -Major Osteoporotic Fracture: Clinical Spine, Forearm, Hip or Shoulder *FRAX is a Materials engineer of the [employer] for Metabolic Bone Disease, a [place] (WHO) [facility]. ASSESSMENT: The probability of a major osteoporotic fracture is 23.1% within the next ten years. The probability of a hip fracture is 6.5% within the next ten years. . Your patient Ladaisha Portillo completed a BMD test on 04/08/2021 using the [facility] (software version: 14.10) manufactured by [employer]. The following summarizes the results of our evaluation. Technologist: crr PATIENT BIOGRAPHICAL: Name: Lizabeth, Fellner Patient ID: [id number] Birth Date: 01/14/1946 Height: 63.0 in. Gender: Female Exam Date: 04/08/2021 Weight: 139.7 lbs. Indications: Advanced Age, Caucasian, Early Menopause, Height Loss, History of Fracture (Adult), hx skin ca, Hypothyroid, Hysterectomy, left knee replacement, Low Body Weight, Postmenopausal Fractures: Left foot, Left lower leg, Right ankle, Right foot Treatments: Levothyroxine, Vitamin D DENSITOMETRY RESULTS: Site      Region     Measured Date Measured Age WHO Classification Young Adult T-score BMD         %Change vs. Previous Significant Change (*) AP Spine L2-L4 04/08/2021 76.0 Osteopenia -1.8 0.986 g/cm2 1.9% - AP Spine L2-L4 07/06/2015 70.3 Osteopenia -2.0 0.968 g/cm2 - - DualFemur Neck Right 04/08/2021 76.0 Osteopenia -2.2 0.725 g/cm2 -4.4% - DualFemur Neck Right 07/06/2015 70.3 Osteopenia -2.0 0.758 g/cm2 - - DualFemur Total Mean 04/08/2021 76.0 Osteopenia -1.5 0.817 g/cm2 -0.5% - DualFemur Total Mean 07/06/2015 70.3 Osteopenia -1.5 0.821 g/cm2 - - ASSESSMENT: The BMD measured at Femur Neck Right is 0.725 g/cm2 with a T-score of -2.2. This patient is considered osteopenic according to [facility]) criteria. L-1 was excluded due to degenerative changes. The scan quality is limited by  exclusion of L-1. World Pharmacologist [facility]) criteria for post-menopausal, Caucasian Women: Normal:                   T-score at or above -1 SD Osteopenia/low bone mass: T-score between -1 and -2.5 SD Osteoporosis:             T-score at or below -2.5 SD RECOMMENDATIONS: 1. All patients should optimize calcium and vitamin D intake. 2. Consider FDA-approved medical therapies in postmenopausal women and men aged 43 years and older, based on the following: a. A hip or vertebral(clinical or morphometric) fracture b. T-score < -2.5 at the femoral neck or spine after appropriate evaluation to exclude secondary causes c. Low bone mass (T-score between -1.0 and -2.5 at the femoral neck or spine) and a 10-year probability of a hip fracture > 3% or a 10-year probability of a major osteoporosis-related fracture > 20% based on the [place]-adapted WHO algorithm 3. Clinician judgment and/or patient preferences may indicate treatment for people with 10-year fracture  probabilities above or below these levels FOLLOW-UP: People with diagnosed cases of osteoporosis or at high risk for fracture should have regular bone mineral density tests. For patients eligible for Medicare, routine testing is allowed once every 2 years. The testing frequency can be increased to one year for patients who have rapidly progressing disease, those who are receiving or discontinuing medical therapy to restore bone mass, or have additional risk factors. I have reviewed this report, and agree with the above findings. [facility] Electronically Signed   By: Rolm Baptise M.D.   On: 04/08/2021 20:54   MM 3D SCREEN BREAST BILATERAL  Result Date: 04/09/2021 CLINICAL DATA:  Screening. EXAM: DIGITAL SCREENING BILATERAL MAMMOGRAM WITH TOMOSYNTHESIS AND CAD TECHNIQUE: Bilateral screening digital craniocaudal and mediolateral oblique mammograms were obtained. Bilateral screening digital breast tomosynthesis was performed. The images were evaluated  with computer-aided detection. COMPARISON:  Previous exam(s). ACR Breast Density Category b: There are scattered areas of fibroglandular density. FINDINGS: There are no findings suspicious for malignancy. IMPRESSION: No mammographic evidence of malignancy. A result letter of this screening mammogram will be mailed directly to the patient. RECOMMENDATION: Screening mammogram in one year. (Code:SM-B-01Y) BI-RADS CATEGORY  1: Negative. Electronically Signed   By: Dorise Bullion III M.D.   On: 04/09/2021 18:33      Assessment & Plan:   Problem List Items Addressed This Visit     Allergic rhinitis    With increased congestion and symptoms as outlined.  Hold abx.  mucinex and nasal spray as directed.  Follow.  Call with update.        Aortic atherosclerosis (HCC)    Continue crestor.        Carotid artery disease (HCC)    Carotid ultrasound - right: 40-59% and left:  1-39%.  Continue statin therapy.  Will need f/u carotid ultrasound.        GERD (gastroesophageal reflux disease)    Had questions about nexium vs prilosec.  Symptoms appear to be doing ok now - controlled.  Continue PPI. Sees GI.        History of colonic polyps    Colonoscopy 01/2018.  Per pt, f/u colonoscopy recommended in 5 years.        Hypercholesteremia    On crestor.  Low cholesterol diet and exercise.  Follow lipid panel and liver function tests.         Hyperglycemia    Low carb diet and exercise.  Follow met b and a1c.        Hypothyroidism    On thyroid replacement.  Follow tsh. Recent tsh (10/19/20) - wnl.        Pain in both feet    Reports concern regarding bone spurs.  Discussed supports.  Request referral to podiatry.        Relevant Orders   Ambulatory referral to Podiatry   Stress    oveall appears to be handling things relatively well. Has buspar if needed.  Does not feel needs any further intervention at this time.  Follow.          Einar Pheasant, MD

## 2021-10-21 LAB — ANTI-NUCLEAR AB-TITER (ANA TITER): ANA Titer 1: 1:320 {titer} — ABNORMAL HIGH

## 2021-10-21 LAB — ANA: Anti Nuclear Antibody (ANA): POSITIVE — AB

## 2021-10-21 LAB — RHEUMATOID FACTOR: Rheumatoid fact SerPl-aCnc: <14 [IU]/mL

## 2021-10-22 ENCOUNTER — Other Ambulatory Visit: Payer: Self-pay

## 2021-10-22 DIAGNOSIS — M2550 Pain in unspecified joint: Secondary | ICD-10-CM

## 2021-10-24 ENCOUNTER — Other Ambulatory Visit: Payer: Self-pay | Admitting: Internal Medicine

## 2021-10-26 ENCOUNTER — Encounter: Payer: Self-pay | Admitting: Internal Medicine

## 2021-10-27 ENCOUNTER — Encounter: Payer: Self-pay | Admitting: Internal Medicine

## 2021-10-27 ENCOUNTER — Telehealth (INDEPENDENT_AMBULATORY_CARE_PROVIDER_SITE_OTHER): Payer: Medicare HMO | Admitting: Internal Medicine

## 2021-10-27 DIAGNOSIS — J301 Allergic rhinitis due to pollen: Secondary | ICD-10-CM | POA: Diagnosis not present

## 2021-10-27 NOTE — Progress Notes (Signed)
Virtual Visit via CAregility Note  This visit type was conducted due to national recommendations for restrictions regarding the COVID-19 pandemic (e.g. social distancing).  This format is felt to be most appropriate for this patient at this time.  All issues noted in this document were discussed and addressed.  No physical exam was performed (except for noted visual exam findings with Video Visits).   I connected withNAME@ on 10/29/21 at  4:30 PM EDT by a video enabled telemedicine application and verified that I am speaking with the correct person using two identifiers. Location patient: home Location provider: work or home office Persons participating in the virtual visit: patient, provider  I discussed the limitations, risks, security and privacy concerns of performing an evaluation and management service by telephone and the availability of in person appointments. I also discussed with the patient that there may be a patient responsible charge related to this service. The patient expressed understanding and agreed to proceed.   Reason for visit: follow up on recent c/o fatigue and congestion  HPI:   76 yr old female with history of CAD, hypothyroidism, presents for evaluation and treatment of fatigue and congestion that started on Sunday after spending time outside .  She has has several days of headache,  sinus congestion and fatigue but states that she is feeling better today,  using sinus irrigation and mucinex , allegra .  No fevers, headache . Some loose stools without nausea   ROS: See pertinent positives and negatives per HPI.  Past Medical History:  Diagnosis Date   Arthritis    Environmental allergies    Esophagitis    Barrets, followed by Dr Vennie Homans   GERD (gastroesophageal reflux disease)    Hypercholesterolemia    Hypothyroidism     Past Surgical History:  Procedure Laterality Date   ANKLE SURGERY  1967   KNEE ARTHROSCOPY  12/03/07   left   KNEE ARTHROSCOPY   06/25/08   left   NASAL SINUS SURGERY  12/06   REPLACEMENT TOTAL KNEE  01/04/09   left   RHINOPLASTY  1986   VAGINAL HYSTERECTOMY  1980   ovaries left in place   WRIST SURGERY  1970   cyst removal    Family History  Problem Relation Age of Onset   Heart disease Father        heart attack   Diabetes Father    Hyperlipidemia Paternal Grandmother    Diabetes Paternal Grandmother    Stroke Paternal Grandmother    Hyperlipidemia Paternal Grandfather    Diabetes Paternal Grandfather    Prostate cancer Maternal Uncle    Pancreatic cancer Cousin    Multiple myeloma Brother    Breast cancer Maternal Aunt    Breast cancer Maternal Aunt     SOCIAL HX:  reports that she has quit smoking. Her smoking use included cigarettes. She has never used smokeless tobacco. She reports current alcohol use. She reports that she does not use drugs.    Current Outpatient Medications:    acetaminophen (TYLENOL) 500 MG tablet, Take 500 mg by mouth every 6 (six) hours as needed., Disp: , Rfl:    Ascorbic Acid (VITAMIN C) 500 MG CAPS, Take 500 mg by mouth daily., Disp: , Rfl:    B Complex Vitamins (VITAMIN B-COMPLEX) TABS, Take 1 tablet by mouth daily., Disp: , Rfl:    BUDESONIDE PO, 2 (two) times daily., Disp: , Rfl:    busPIRone (BUSPAR) 5 MG tablet, Take 5 mg by mouth  daily as needed., Disp: , Rfl:    Cholecalciferol (VITAMIN D PO), Take 10,000 Units by mouth daily. , Disp: , Rfl:    Coenzyme Q10 (COQ-10 PO), Take 120 mg by mouth daily., Disp: , Rfl:    COLLAGEN PO, Take 11 mg by mouth daily., Disp: , Rfl:    diclofenac sodium (VOLTAREN) 1 % GEL, Apply 1 application topically as needed., Disp: , Rfl:    ELDERBERRY PO, Take 125 mg by mouth daily. , Disp: , Rfl:    fexofenadine (ALLEGRA) 180 MG tablet, Take 180 mg by mouth daily., Disp: , Rfl:    fluocinonide cream (LIDEX) 8.25 %, Apply 1 application topically 2 (two) times daily as needed. , Disp: , Rfl:    HYDROMORPHONE HCL PO, Take by mouth., Disp: ,  Rfl:    Hypertonic Nasal Wash (SINUS RINSE NA), Place into the nose as needed., Disp: , Rfl:    LEVOFLOXACIN PO, 2 (two) times daily. levofloxacin 116m/ml cash  ADD 1ML OF MEDICATION TO 240ML OF SALINE IN SALINE IRRIGATION BOTTLE; IRRIGATE, Disp: , Rfl:    levothyroxine (SYNTHROID) 50 MCG tablet, TAKE 1 TABLET EVERY OTHER  DAY, Disp: 45 tablet, Rfl: 3   levothyroxine (SYNTHROID) 75 MCG tablet, TAKE 1 TABLET EVERY OTHER  DAY, Disp: 45 tablet, Rfl: 3   NON FORMULARY, DE 3 Omega Benefits TID, Disp: , Rfl:    Omega-3 Fatty Acids (FISH OIL PO), Take 1,200 mg by mouth daily., Disp: , Rfl:    omeprazole (PRILOSEC) 40 MG capsule, TAKE 1 CAPSULE DAILY, Disp: 90 capsule, Rfl: 1   oxyCODONE-acetaminophen (PERCOCET/ROXICET) 5-325 MG tablet, Take by mouth every 4 (four) hours as needed for severe pain., Disp: , Rfl:    Probiotic Product (PROBIOTIC DAILY PO), Take 1 capsule by mouth., Disp: , Rfl:    rosuvastatin (CRESTOR) 20 MG tablet, TAKE 1 TABLET DAILY ON     MONDAY, WEDNESDAY, AND     FRIDAY AND 10MG ALL OTHER  DAYS, Disp: 39 tablet, Rfl: 3   sodium chloride irrigation 0.9 % irrigation, sodium chloride 0.9 % irrigation solution, Disp: , Rfl:    traMADol (ULTRAM) 50 MG tablet, Take by mouth every 6 (six) hours as needed., Disp: , Rfl:    UNABLE TO FIND, Take 3,326 mg by mouth 2 (two) times daily as needed. Med Name: CBD/CBG SWISH CELLULAR NUTRITION 3326 MG, Disp: , Rfl:    vitamin E 400 UNIT capsule, Take 400 Units by mouth daily., Disp: , Rfl:    acyclovir (ZOVIRAX) 800 MG tablet, Take 1 tablet (800 mg total) by mouth 2 (two) times daily. (Patient not taking: Reported on 10/20/2021), Disp: 60 tablet, Rfl: 0   budesonide (PULMICORT) 1 MG/2ML nebulizer solution, ADD 1ML OF MEDICATION TO 240ML OF SALINE IN SALINE IRRIGATION BOTTLE; IRRIGATE SINUSES WITH 120ML THROUGH EACH NOSTRIL TWICE DAILY (Patient not taking: Reported on 10/20/2021), Disp: , Rfl:    Cyanocobalamin (B-12 PO), Take 5,000 mcg by mouth daily.  (Patient not taking: Reported on 10/20/2021), Disp: , Rfl:   EXAM:  VITALS per patient if applicable:  GENERAL: alert, oriented, appears well and in no acute distress  HEENT: atraumatic, conjunttiva clear, no obvious abnormalities on inspection of external nose and ears  NECK: normal movements of the head and neck  LUNGS: on inspection no signs of respiratory distress, breathing rate appears normal, no obvious gross SOB, gasping or wheezing  CV: no obvious cyanosis  MS: moves all visible extremities without noticeable abnormality  PSYCH/NEURO: pleasant and  cooperative, no obvious depression or anxiety, speech and thought processing grossly intact  ASSESSMENT AND PLAN:  Discussed the following assessment and plan:  Seasonal allergic rhinitis due to pollen  Allergic rhinitis Improved with hse of OTC meds to relieve congestion.  No further treatment needed . Continue sinus irrigation, use of anthistamines    I discussed the assessment and treatment plan with the patient. The patient was provided an opportunity to ask questions and all were answered. The patient agreed with the plan and demonstrated an understanding of the instructions.   The patient was advised to call back or seek an in-person evaluation if the symptoms worsen or if the condition fails to improve as anticipated.   I spent 20 minutes dedicated to the care of this patient on the date of this encounter to include pre-visit review of her medical history, evaluation of current symptoms and last treatment for sinusitis in a Face-to-face t virtual visit  with the patient .    Crecencio Mc, MD

## 2021-10-29 DIAGNOSIS — J309 Allergic rhinitis, unspecified: Secondary | ICD-10-CM | POA: Insufficient documentation

## 2021-10-29 NOTE — Assessment & Plan Note (Addendum)
Improved with hse of OTC meds to relieve congestion.  No further treatment needed . Continue sinus irrigation, use of anthistamines

## 2021-10-31 ENCOUNTER — Encounter: Payer: Self-pay | Admitting: Internal Medicine

## 2021-10-31 ENCOUNTER — Telehealth: Payer: Self-pay | Admitting: Internal Medicine

## 2021-10-31 DIAGNOSIS — M79671 Pain in right foot: Secondary | ICD-10-CM | POA: Insufficient documentation

## 2021-10-31 DIAGNOSIS — M79672 Pain in left foot: Secondary | ICD-10-CM | POA: Insufficient documentation

## 2021-10-31 NOTE — Telephone Encounter (Signed)
Please call and notify [name] that she just had carotid ultrasound 04/2021.  Given just had in November, will hold on rechecking at this time.  We will follow. Someone should be contacting her with podiatry appt.

## 2021-10-31 NOTE — Assessment & Plan Note (Signed)
Carotid ultrasound - right: 40-59% and left:  1-39%.  Continue statin therapy.  Will need f/u carotid ultrasound.

## 2021-10-31 NOTE — Assessment & Plan Note (Signed)
With increased congestion and symptoms as outlined.  Hold abx.  mucinex and nasal spray as directed.  Follow.  Call with update.

## 2021-10-31 NOTE — Assessment & Plan Note (Signed)
Low carb diet and exercise.  Follow met b and a1c.  

## 2021-10-31 NOTE — Assessment & Plan Note (Signed)
On thyroid replacement.  Follow tsh. Recent tsh (10/19/20) - wnl.  

## 2021-10-31 NOTE — Assessment & Plan Note (Signed)
oveall appears to be handling things relatively well. Has buspar if needed.  Does not feel needs any further intervention at this time.  Follow.

## 2021-10-31 NOTE — Assessment & Plan Note (Signed)
Reports concern regarding bone spurs.  Discussed supports.  Request referral to podiatry.

## 2021-10-31 NOTE — Assessment & Plan Note (Signed)
Colonoscopy 01/2018.  Per pt, f/u colonoscopy recommended in 5 years.  

## 2021-10-31 NOTE — Assessment & Plan Note (Signed)
On crestor.  Low cholesterol diet and exercise.  Follow lipid panel and liver function tests.   

## 2021-10-31 NOTE — Assessment & Plan Note (Signed)
Continue crestor 

## 2021-10-31 NOTE — Assessment & Plan Note (Signed)
Had questions about nexium vs prilosec.  Symptoms appear to be doing ok now - controlled.  Continue PPI. Sees GI.

## 2021-11-01 NOTE — Telephone Encounter (Signed)
Pt advised - agreeable. Also has podiatry appointment this Friday.

## 2021-11-03 DIAGNOSIS — M19071 Primary osteoarthritis, right ankle and foot: Secondary | ICD-10-CM | POA: Diagnosis not present

## 2021-11-03 DIAGNOSIS — M19072 Primary osteoarthritis, left ankle and foot: Secondary | ICD-10-CM | POA: Diagnosis not present

## 2021-11-05 ENCOUNTER — Ambulatory Visit: Payer: Medicare HMO | Admitting: Podiatry

## 2021-11-09 ENCOUNTER — Ambulatory Visit (INDEPENDENT_AMBULATORY_CARE_PROVIDER_SITE_OTHER): Payer: Medicare HMO

## 2021-11-09 ENCOUNTER — Ambulatory Visit: Payer: Medicare HMO | Admitting: Podiatry

## 2021-11-09 ENCOUNTER — Encounter: Payer: Self-pay | Admitting: Podiatry

## 2021-11-09 DIAGNOSIS — M722 Plantar fascial fibromatosis: Secondary | ICD-10-CM | POA: Diagnosis not present

## 2021-11-09 MED ORDER — METHYLPREDNISOLONE 4 MG PO TBPK: ORAL_TABLET | ORAL | 0 refills | Status: DC

## 2021-11-09 NOTE — Progress Notes (Signed)
   HPI: 76 y.o. female presenting today for second opinion regarding bilateral foot pain has been chronic and been going on for several years.  She does have a history of trauma to the right foot several years prior and she has had multiple surgeries to the foot.  She states that she sees [facility] and has injections every 6 months.  She is also tried custom molded orthotics in the past with minimal relief.  She presents for further treatment and evaluation  Past Medical History:  Diagnosis Date   Arthritis    Environmental allergies    Esophagitis    Barrets, followed by Dr Vennie Homans   GERD (gastroesophageal reflux disease)    Hypercholesterolemia    Hypothyroidism     Past Surgical History:  Procedure Laterality Date   ANKLE SURGERY  1967   KNEE ARTHROSCOPY  12/03/07   left   KNEE ARTHROSCOPY  06/25/08   left   NASAL SINUS SURGERY  12/06   REPLACEMENT TOTAL KNEE  01/04/09   left   RHINOPLASTY  1986   VAGINAL HYSTERECTOMY  1980   ovaries left in place   WRIST SURGERY  1970   cyst removal    Allergies  Allergen Reactions   Diclofenac Nausea Only   Ibuprofen Other (See Comments)    Stomach issues   Tramadol Nausea Only   Hydromorphone Rash   Oxycodone Other (See Comments)    constipation     Physical Exam: General: The patient is alert and oriented x3 in no acute distress.  Dermatology: Skin is warm, dry and supple bilateral lower extremities. Negative for open lesions or macerations.  Vascular: Palpable pedal pulses bilaterally. Capillary refill within normal limits.  Negative for any significant edema or erythema  Neurological: Light touch and protective threshold grossly intact  Musculoskeletal Exam: No pedal deformities noted.  There is some limited range of motion with tenderness to palpation throughout the bilateral feet, especially the midtarsal joints  Radiographic Exam:  Osteopenia noted throughout the osseous structures of the foot.  Advanced degenerative  changes with joint space narrowing and periarticular spurring noted to the right foot compared to the contralateral left foot.  Compatible with the patient's given history of trauma to this foot.  No acute fractures identified.  No hardware noted  Assessment: 1.  DJD/arthritis RT > LT   Plan of Care:  1. Patient evaluated. X-Rays reviewed.  2.  Patient already receives [facility] every 6 months.  Continue as needed 3.  Patient has tried orthotics in the past.  Recommend good supportive shoes with arch supports 4.  Patient cannot take NSAIDs secondary to precancerous stomach ulcers 5.  Prescription for Medrol Dosepak for acute anti-inflammatory relief 6.  Return to clinic as needed      Edrick Kins, DPM [facility]  Dr. Edrick Kins, DPM    [address], [place] [address]                Office [phone]  Fax [phone]

## 2021-11-11 DIAGNOSIS — R768 Other specified abnormal immunological findings in serum: Secondary | ICD-10-CM | POA: Diagnosis not present

## 2021-11-11 DIAGNOSIS — M159 Polyosteoarthritis, unspecified: Secondary | ICD-10-CM | POA: Diagnosis not present

## 2021-11-18 ENCOUNTER — Telehealth: Payer: Self-pay | Admitting: Internal Medicine

## 2021-11-18 ENCOUNTER — Other Ambulatory Visit: Payer: Self-pay

## 2021-11-18 MED ORDER — ROSUVASTATIN CALCIUM 20 MG PO TABS: 20.0000 mg | ORAL_TABLET | Freq: Every day | ORAL | 3 refills | Status: DC

## 2021-11-18 NOTE — Telephone Encounter (Signed)
Patient called and needs her rosuvastatin (CRESTOR) 20 MG tablet prescription to say take every day not Monday, Wednesday, and Friday. She only received 39 tablets she will now need a 90 day supply.

## 2021-11-18 NOTE — Telephone Encounter (Signed)
sent 

## 2021-11-29 DIAGNOSIS — H2513 Age-related nuclear cataract, bilateral: Secondary | ICD-10-CM | POA: Diagnosis not present

## 2021-11-29 DIAGNOSIS — H5203 Hypermetropia, bilateral: Secondary | ICD-10-CM | POA: Diagnosis not present

## 2021-11-29 DIAGNOSIS — H0288A Meibomian gland dysfunction right eye, upper and lower eyelids: Secondary | ICD-10-CM | POA: Diagnosis not present

## 2021-11-29 DIAGNOSIS — H04123 Dry eye syndrome of bilateral lacrimal glands: Secondary | ICD-10-CM | POA: Diagnosis not present

## 2021-11-29 DIAGNOSIS — H0288B Meibomian gland dysfunction left eye, upper and lower eyelids: Secondary | ICD-10-CM | POA: Diagnosis not present

## 2021-12-08 ENCOUNTER — Ambulatory Visit (INDEPENDENT_AMBULATORY_CARE_PROVIDER_SITE_OTHER): Payer: Medicare HMO | Admitting: Internal Medicine

## 2021-12-08 ENCOUNTER — Encounter: Payer: Self-pay | Admitting: Internal Medicine

## 2021-12-08 VITALS — BP 110/68 | HR 64 | Temp 97.8°F | Resp 20 | Ht 63.0 in | Wt 141.6 lb

## 2021-12-08 DIAGNOSIS — R739 Hyperglycemia, unspecified: Secondary | ICD-10-CM | POA: Diagnosis not present

## 2021-12-08 DIAGNOSIS — K2100 Gastro-esophageal reflux disease with esophagitis, without bleeding: Secondary | ICD-10-CM | POA: Diagnosis not present

## 2021-12-08 DIAGNOSIS — E7800 Pure hypercholesterolemia, unspecified: Secondary | ICD-10-CM

## 2021-12-08 DIAGNOSIS — I779 Disorder of arteries and arterioles, unspecified: Secondary | ICD-10-CM

## 2021-12-08 DIAGNOSIS — E039 Hypothyroidism, unspecified: Secondary | ICD-10-CM | POA: Diagnosis not present

## 2021-12-08 DIAGNOSIS — Z860100 Personal history of colon polyps, unspecified: Secondary | ICD-10-CM

## 2021-12-08 DIAGNOSIS — F439 Reaction to severe stress, unspecified: Secondary | ICD-10-CM

## 2021-12-08 DIAGNOSIS — I700 Atherosclerosis of aorta: Secondary | ICD-10-CM | POA: Diagnosis not present

## 2021-12-08 DIAGNOSIS — R195 Other fecal abnormalities: Secondary | ICD-10-CM

## 2021-12-08 DIAGNOSIS — R69 Illness, unspecified: Secondary | ICD-10-CM | POA: Diagnosis not present

## 2021-12-08 DIAGNOSIS — R0989 Other specified symptoms and signs involving the circulatory and respiratory systems: Secondary | ICD-10-CM

## 2021-12-08 MED ORDER — ROSUVASTATIN CALCIUM 20 MG PO TABS: 20.0000 mg | ORAL_TABLET | Freq: Every day | ORAL | 3 refills | Status: DC

## 2021-12-08 NOTE — Progress Notes (Signed)
Patient ID: [name], female   DOB: 1945-08-01, 76 y.o.   MRN: [id number]   Subjective:    Patient ID: [name], female    DOB: 1945/09/03, 76 y.o.   MRN: [id number]   Patient here for a scheduled follow up.   Chief Complaint  Patient presents with   Follow-up    7 week follow up   .   HPI Here to follow up regarding reflux, bowel issues, cholesterol and increased stress. Recently saw rheumatology for positive ANA.  Exam most c/w OA.  She has been having persistent intermittent problems with loose watery stools.  Some intermittent LLQ discomfort and at times epigastric discomfort.  Occasional nausea.  Due to see GI tomorrow.  No chest pain or sob reported.  No increased cough or congestion.  Handling stress.     Past Medical History:  Diagnosis Date   Arthritis    Environmental allergies    Esophagitis    Barrets, followed by Dr Vennie Homans   GERD (gastroesophageal reflux disease)    Hypercholesterolemia    Hypothyroidism    Past Surgical History:  Procedure Laterality Date   ANKLE SURGERY  1967   KNEE ARTHROSCOPY  12/03/07   left   KNEE ARTHROSCOPY  06/25/08   left   NASAL SINUS SURGERY  12/06   REPLACEMENT TOTAL KNEE  01/04/09   left   RHINOPLASTY  1986   VAGINAL HYSTERECTOMY  1980   ovaries left in place   WRIST SURGERY  1970   cyst removal   Family History  Problem Relation Age of Onset   Heart disease Father        heart attack   Diabetes Father    Hyperlipidemia Paternal Grandmother    Diabetes Paternal Grandmother    Stroke Paternal Grandmother    Hyperlipidemia Paternal Grandfather    Diabetes Paternal Grandfather    Prostate cancer Maternal Uncle    Pancreatic cancer Cousin    Multiple myeloma Brother    Breast cancer Maternal Aunt    Breast cancer Maternal Aunt    Social History   Socioeconomic History   Marital status: Married    Spouse name: Not on file   Number of children: Not on file   Years of education: Not on file   Highest  education level: Not on file  Occupational History   Not on file  Tobacco Use   Smoking status: Former    Years: 5.00    Types: Cigarettes   Smokeless tobacco: Never  Vaping Use   Vaping Use: Never used  Substance and Sexual Activity   Alcohol use: Never    Comment: rare   Drug use: No   Sexual activity: Yes  Other Topics Concern   Not on file  Social History Narrative   Not on file   Social Determinants of Health   Financial Resource Strain: Low Risk  (02/22/2021)   Overall Financial Resource Strain (CARDIA)    Difficulty of Paying Living Expenses: Not hard at all  Food Insecurity: No Food Insecurity (02/22/2021)   Hunger Vital Sign    Worried About Running Out of Food in the Last Year: Never true    Ran Out of Food in the Last Year: Never true  Transportation Needs: No Transportation Needs (02/22/2021)   PRAPARE - Hydrologist (Medical): No    Lack of Transportation (Non-Medical): No  Physical Activity: Insufficiently Active (02/22/2021)   Exercise Vital Sign  Days of Exercise per Week: 2 days    Minutes of Exercise per Session: 60 min  Stress: No Stress Concern Present (02/22/2021)   [place]    Feeling of Stress : Not at all  Social Connections: Unknown (02/22/2021)   Social Connection and Isolation Panel [NHANES]    Frequency of Communication with Friends and Family: More than three times a week    Frequency of Social Gatherings with Friends and Family: Not on file    Attends Religious Services: Not on file    Active Member of Clubs or Organizations: Not on file    Attends Archivist Meetings: Not on file    Marital Status: Married     Review of Systems  Constitutional:  Negative for appetite change and unexpected weight change.  HENT:  Negative for congestion and sinus pressure.   Respiratory:  Negative for cough, chest tightness and shortness of breath.    Cardiovascular:  Negative for chest pain, palpitations and leg swelling.  Gastrointestinal:  Negative for vomiting.       Pain as outlined.  Occasional nausea and loose stools - as outlined.   Genitourinary:  Negative for difficulty urinating and dysuria.  Musculoskeletal:  Negative for myalgias.       Joint pain - just evaluated by rheumatology.   Skin:  Negative for color change and rash.  Neurological:  Negative for dizziness, light-headedness and headaches.  Psychiatric/Behavioral:  Negative for agitation and dysphoric mood.        Objective:     BP 110/68 (BP Location: Left Arm, Patient Position: Sitting, Cuff Size: Normal)   Pulse 64   Temp 97.8 F (36.6 C) (Oral)   Resp 20   Ht _0  (1.6 m)   Wt 141 lb 9.6 oz (64.2 kg)   SpO2 99%   BMI 25.08 kg/m  Wt Readings from Last 3 Encounters:  12/08/21 141 lb 9.6 oz (64.2 kg)  10/20/21 146 lb 6.4 oz (66.4 kg)  06/18/21 142 lb (64.4 kg)    Physical Exam Vitals reviewed.  Constitutional:      General: She is not in acute distress.    Appearance: Normal appearance.  HENT:     Head: Normocephalic and atraumatic.     Right Ear: External ear normal.     Left Ear: External ear normal.  Eyes:     General: No scleral icterus.       Right eye: No discharge.        Left eye: No discharge.     Conjunctiva/sclera: Conjunctivae normal.  Neck:     Thyroid: No thyromegaly.  Cardiovascular:     Rate and Rhythm: Normal rate and regular rhythm.  Pulmonary:     Effort: No respiratory distress.     Breath sounds: Normal breath sounds. No wheezing.  Abdominal:     General: Bowel sounds are normal.     Palpations: Abdomen is soft.     Tenderness: There is no abdominal tenderness.  Musculoskeletal:        General: No swelling or tenderness.     Cervical back: Neck supple. No tenderness.  Lymphadenopathy:     Cervical: No cervical adenopathy.  Skin:    Findings: No erythema or rash.  Neurological:     Mental Status: She is  alert.  Psychiatric:        Mood and Affect: Mood normal.        Behavior: Behavior normal.  Outpatient Encounter Medications as of 12/08/2021  Medication Sig   acetaminophen (TYLENOL) 500 MG tablet Take 500 mg by mouth every 6 (six) hours as needed.   Ascorbic Acid (VITAMIN C) 500 MG CAPS Take 500 mg by mouth daily.   B Complex Vitamins (VITAMIN B-COMPLEX) TABS Take 1 tablet by mouth daily.   BUDESONIDE PO 2 (two) times daily.   busPIRone (BUSPAR) 5 MG tablet Take 5 mg by mouth daily as needed.   Cholecalciferol (VITAMIN D PO) Take 10,000 Units by mouth daily.    Coenzyme Q10 (COQ-10 PO) Take 120 mg by mouth daily.   COLLAGEN PO Take 11 mg by mouth daily.   diclofenac sodium (VOLTAREN) 1 % GEL Apply 1 application topically as needed.   fexofenadine (ALLEGRA) 180 MG tablet Take 180 mg by mouth daily.   fluocinonide cream (LIDEX) 7.67 % Apply 1 application topically 2 (two) times daily as needed.    Hypertonic Nasal Wash (SINUS RINSE NA) Place into the nose as needed.   LEVOFLOXACIN PO 2 (two) times daily. levofloxacin 142m/ml cash  ADD 1ML OF MEDICATION TO 240ML OF SALINE IN SALINE IRRIGATION BOTTLE; IRRIGATE   levothyroxine (SYNTHROID) 50 MCG tablet TAKE 1 TABLET EVERY OTHER  DAY   levothyroxine (SYNTHROID) 75 MCG tablet TAKE 1 TABLET EVERY OTHER  DAY   methylPREDNISolone (MEDROL DOSEPAK) 4 MG TBPK tablet 6 day dose pack - take as directed   NON FORMULARY DE 3 Omega Benefits TID   Omega-3 Fatty Acids (FISH OIL PO) Take 1,200 mg by mouth daily.   omeprazole (PRILOSEC) 40 MG capsule TAKE 1 CAPSULE DAILY   Probiotic Product (PROBIOTIC DAILY PO) Take 1 capsule by mouth.   sodium chloride irrigation 0.9 % irrigation sodium chloride 0.9 % irrigation solution   vitamin E 400 UNIT capsule Take 400 Units by mouth daily.   [DISCONTINUED] rosuvastatin (CRESTOR) 20 MG tablet Take 1 tablet (20 mg total) by mouth daily.   rosuvastatin (CRESTOR) 20 MG tablet Take 1 tablet (20 mg total) by  mouth daily.   [DISCONTINUED] ELDERBERRY PO Take 125 mg by mouth daily.  (Patient not taking: Reported on 11/09/2021)   [DISCONTINUED] HYDROMORPHONE HCL PO Take by mouth. (Patient not taking: Reported on 11/09/2021)   [DISCONTINUED] oxyCODONE-acetaminophen (PERCOCET/ROXICET) 5-325 MG tablet Take by mouth every 4 (four) hours as needed for severe pain. (Patient not taking: Reported on 11/09/2021)   [DISCONTINUED] traMADol (ULTRAM) 50 MG tablet Take by mouth every 6 (six) hours as needed. (Patient not taking: Reported on 11/09/2021)   [DISCONTINUED] UNABLE TO FIND Take 3,326 mg by mouth 2 (two) times daily as needed. Med Name: CBD/CBG SWISH CELLULAR NUTRITION 3326 MG (Patient not taking: Reported on 12/08/2021)   No facility-administered encounter medications on file as of 12/08/2021.     Lab Results  Component Value Date   WBC 6.0 10/18/2021   HGB 12.5 10/18/2021   HCT 36.6 10/18/2021   PLT 219.0 10/18/2021   GLUCOSE 89 10/18/2021   CHOL 178 10/18/2021   TRIG 83.0 10/18/2021   HDL 65.90 10/18/2021   LDLDIRECT 123.4 02/27/2013   LDLCALC 96 10/18/2021   ALT 17 10/18/2021   AST 19 10/18/2021   NA 142 10/18/2021   K 4.5 10/18/2021   CL 105 10/18/2021   CREATININE 0.62 10/18/2021   BUN 14 10/18/2021   CO2 30 10/18/2021   TSH 2.30 10/18/2021   HGBA1C 5.5 10/18/2021    MM 3D SCREEN BREAST BILATERAL  Result Date: 04/09/2021 CLINICAL DATA:  Screening.  EXAM: DIGITAL SCREENING BILATERAL MAMMOGRAM WITH TOMOSYNTHESIS AND CAD TECHNIQUE: Bilateral screening digital craniocaudal and mediolateral oblique mammograms were obtained. Bilateral screening digital breast tomosynthesis was performed. The images were evaluated with computer-aided detection. COMPARISON:  Previous exam(s). ACR Breast Density Category b: There are scattered areas of fibroglandular density. FINDINGS: There are no findings suspicious for malignancy. IMPRESSION: No mammographic evidence of malignancy. A result letter of this screening  mammogram will be mailed directly to the patient. RECOMMENDATION: Screening mammogram in one year. (Code:SM-B-01Y) BI-RADS CATEGORY  1: Negative. Electronically Signed   By: Dorise Bullion III M.D.   On: 04/09/2021 18:33  DG Bone Density  Result Date: 04/08/2021 EXAM: DUAL X-RAY ABSORPTIOMETRY (DXA) FOR BONE MINERAL DENSITY IMPRESSION: Your patient ABRAR KOONE completed a FRAX assessment on 04/08/2021 using the [place] (analysis version: 14.10) manufactured by [employer]. The following summarizes the results of our evaluation. PATIENT BIOGRAPHICAL: Name: Aleeya, Veitch Patient ID: [id number] Birth Date: 06-26-1945 Height:    63.0 in. Gender:     [age]    [age]       Weight:    139.7 lbs. Ethnicity:  White                            Exam Date: 04/08/2021 FRAX* RESULTS:  (version: 3.5) 10-year Probability of Fracture1 Major Osteoporotic Fracture2 Hip Fracture 23.1% 6.5% Population: [place] (Caucasian) Risk Factors: History of Fracture (Adult) Based on Femur (Right) Neck BMD 1 -The 10-year probability of fracture may be lower than reported if the patient has received treatment. 2 -Major Osteoporotic Fracture: Clinical Spine, Forearm, Hip or Shoulder *FRAX is a Materials engineer of the [employer] for Metabolic Bone Disease, a [place] (WHO) [facility]. ASSESSMENT: The probability of a major osteoporotic fracture is 23.1% within the next ten years. The probability of a hip fracture is 6.5% within the next ten years. . Your patient Illyana Schorsch completed a BMD test on 04/08/2021 using the [place] (software version: 14.10) manufactured by [employer]. The following summarizes the results of our evaluation. Technologist: crr PATIENT BIOGRAPHICAL: Name: Moni, Rothrock Patient ID: [id number] Birth Date: 1945-09-18 Height: 63.0 in. Gender: Female Exam Date: 04/08/2021 Weight: 139.7 lbs. Indications: Advanced  Age, Caucasian, Early Menopause, Height Loss, History of Fracture (Adult), hx skin ca, Hypothyroid, Hysterectomy, left knee replacement, Low Body Weight, Postmenopausal Fractures: Left foot, Left lower leg, Right ankle, Right foot Treatments: Levothyroxine, Vitamin D DENSITOMETRY RESULTS: Site      Region     Measured Date Measured Age WHO Classification Young Adult T-score BMD         %Change vs. Previous Significant Change (*) AP Spine L2-L4 04/08/2021 76.0 Osteopenia -1.8 0.986 g/cm2 1.9% - AP Spine L2-L4 07/06/2015 70.3 Osteopenia -2.0 0.968 g/cm2 - - DualFemur Neck Right 04/08/2021 76.0 Osteopenia -2.2 0.725 g/cm2 -4.4% - DualFemur Neck Right 07/06/2015 70.3 Osteopenia -2.0 0.758 g/cm2 - - DualFemur Total Mean 04/08/2021 76.0 Osteopenia -1.5 0.817 g/cm2 -0.5% - DualFemur Total Mean 07/06/2015 70.3 Osteopenia -1.5 0.821 g/cm2 - - ASSESSMENT: The BMD measured at Femur Neck Right is 0.725 g/cm2 with a T-score of -2.2. This patient is considered osteopenic according to [facility]) criteria. L-1 was excluded due to degenerative changes. The scan quality is limited by exclusion of L-1. World Health Organization [facility]) criteria for post-menopausal, Caucasian Women:  Normal:                   T-score at or above -1 SD Osteopenia/low bone mass: T-score between -1 and -2.5 SD Osteoporosis:             T-score at or below -2.5 SD RECOMMENDATIONS: 1. All patients should optimize calcium and vitamin D intake. 2. Consider FDA-approved medical therapies in postmenopausal women and men aged 70 years and older, based on the following: a. A hip or vertebral(clinical or morphometric) fracture b. T-score < -2.5 at the femoral neck or spine after appropriate evaluation to exclude secondary causes c. Low bone mass (T-score between -1.0 and -2.5 at the femoral neck or spine) and a 10-year probability of a hip fracture > 3% or a 10-year probability of a major osteoporosis-related fracture > 20% based on the  [place]-adapted WHO algorithm 3. Clinician judgment and/or patient preferences may indicate treatment for people with 10-year fracture probabilities above or below these levels FOLLOW-UP: People with diagnosed cases of osteoporosis or at high risk for fracture should have regular bone mineral density tests. For patients eligible for Medicare, routine testing is allowed once every 2 years. The testing frequency can be increased to one year for patients who have rapidly progressing disease, those who are receiving or discontinuing medical therapy to restore bone mass, or have additional risk factors. I have reviewed this report, and agree with the above findings. [facility] Electronically Signed   By: Rolm Baptise M.D.   On: 04/08/2021 20:54       Assessment & Plan:   Problem List Items Addressed This Visit     Aortic atherosclerosis ([place])    Continue crestor.       Relevant Medications   rosuvastatin (CRESTOR) 20 MG tablet   Carotid artery disease ([facility])    Carotid ultrasound - right: 40-59% and left:  1-39%.  Continue statin therapy.  Carotid ultrasound (04/2021)      Relevant Medications   rosuvastatin (CRESTOR) 20 MG tablet   GERD (gastroesophageal reflux disease)    EGD:  11/21/19 - moderate diffuse gastritis - pathology - reactive gastropathy.  On PPI.  Has f/u planned with GI tomorrow.       History of colonic polyps    Colonoscopy 01/2018.  Per pt, f/u colonoscopy recommended in 5 years.  Having persistent intermittent diarrhea.  Has f/u planned with GI tomorrow.        Hypercholesteremia    On crestor.  Low cholesterol diet and exercise.  Follow lipid panel and liver function tests.        Relevant Medications   rosuvastatin (CRESTOR) 20 MG tablet   Other Relevant Orders   Lipid panel   Basic metabolic panel   Hepatic function panel   Hyperglycemia    Low carb diet and exercise.  Follow met b and a1c.       Relevant Orders   Hemoglobin A1c   Hypothyroidism     On thyroid replacement.  Follow tsh. Recent tsh (10/19/20) - wnl.       Left carotid bruit - Primary    Carotid ultrasound as outlined.       Loose stools    Persistent intermittent loose stool as outlined.  Discussed further w/up.  Colonoscopy as outlined.  Due to see GI tomorrow.  Will hold on obtaining stool studies, etc - to see what w/up GI plans.        Stress  Increased stress with current medical issues.  Overall she feels she is doing relatively well.  Continue buspar prn  Follow.         Einar Pheasant, MD

## 2021-12-09 DIAGNOSIS — K449 Diaphragmatic hernia without obstruction or gangrene: Secondary | ICD-10-CM | POA: Diagnosis not present

## 2021-12-09 DIAGNOSIS — E039 Hypothyroidism, unspecified: Secondary | ICD-10-CM | POA: Diagnosis not present

## 2021-12-09 DIAGNOSIS — Z86010 Personal history of colonic polyps: Secondary | ICD-10-CM | POA: Diagnosis not present

## 2021-12-09 DIAGNOSIS — K219 Gastro-esophageal reflux disease without esophagitis: Secondary | ICD-10-CM | POA: Diagnosis not present

## 2021-12-09 DIAGNOSIS — E782 Mixed hyperlipidemia: Secondary | ICD-10-CM | POA: Diagnosis not present

## 2021-12-09 DIAGNOSIS — K5730 Diverticulosis of large intestine without perforation or abscess without bleeding: Secondary | ICD-10-CM | POA: Diagnosis not present

## 2021-12-09 DIAGNOSIS — R1013 Epigastric pain: Secondary | ICD-10-CM | POA: Diagnosis not present

## 2021-12-09 DIAGNOSIS — R194 Change in bowel habit: Secondary | ICD-10-CM | POA: Diagnosis not present

## 2021-12-13 ENCOUNTER — Encounter: Payer: Self-pay | Admitting: Internal Medicine

## 2021-12-13 DIAGNOSIS — K219 Gastro-esophageal reflux disease without esophagitis: Secondary | ICD-10-CM | POA: Diagnosis not present

## 2021-12-13 DIAGNOSIS — K2970 Gastritis, unspecified, without bleeding: Secondary | ICD-10-CM | POA: Diagnosis not present

## 2021-12-13 DIAGNOSIS — K3189 Other diseases of stomach and duodenum: Secondary | ICD-10-CM | POA: Diagnosis not present

## 2021-12-13 DIAGNOSIS — K317 Polyp of stomach and duodenum: Secondary | ICD-10-CM | POA: Diagnosis not present

## 2021-12-13 DIAGNOSIS — K449 Diaphragmatic hernia without obstruction or gangrene: Secondary | ICD-10-CM | POA: Diagnosis not present

## 2021-12-13 DIAGNOSIS — R1013 Epigastric pain: Secondary | ICD-10-CM | POA: Diagnosis not present

## 2021-12-14 ENCOUNTER — Encounter: Payer: Self-pay | Admitting: Internal Medicine

## 2021-12-14 NOTE — Assessment & Plan Note (Signed)
Carotid ultrasound - right: 40-59% and left:  1-39%.  Continue statin therapy.  Carotid ultrasound (04/2021)

## 2021-12-14 NOTE — Assessment & Plan Note (Signed)
Continue crestor 

## 2021-12-14 NOTE — Assessment & Plan Note (Signed)
Carotid ultrasound as outlined.

## 2021-12-14 NOTE — Assessment & Plan Note (Signed)
On thyroid replacement.  Follow tsh. Recent tsh (10/19/20) - wnl.

## 2021-12-14 NOTE — Assessment & Plan Note (Signed)
Persistent intermittent loose stool as outlined.  Discussed further w/up.  Colonoscopy as outlined.  Due to see GI tomorrow.  Will hold on obtaining stool studies, etc - to see what w/up GI plans.

## 2021-12-14 NOTE — Assessment & Plan Note (Signed)
Low carb diet and exercise.  Follow met b and a1c.  

## 2021-12-14 NOTE — Assessment & Plan Note (Signed)
Colonoscopy 01/2018.  Per pt, f/u colonoscopy recommended in 5 years.  Having persistent intermittent diarrhea.  Has f/u planned with GI tomorrow.

## 2021-12-14 NOTE — Assessment & Plan Note (Signed)
EGD:  11/21/19 - moderate diffuse gastritis - pathology - reactive gastropathy.  On PPI.  Has f/u planned with GI tomorrow.

## 2021-12-14 NOTE — Assessment & Plan Note (Signed)
On crestor.  Low cholesterol diet and exercise.  Follow lipid panel and liver function tests.   

## 2021-12-14 NOTE — Assessment & Plan Note (Signed)
Increased stress with current medical issues.  Overall she feels she is doing relatively well.  Continue buspar prn  Follow.

## 2021-12-15 ENCOUNTER — Encounter: Payer: Self-pay | Admitting: Internal Medicine

## 2021-12-15 NOTE — Telephone Encounter (Signed)
Thanks for the update. Reviewed EGD report.  Keep [place] posted on how doing.

## 2022-01-03 ENCOUNTER — Other Ambulatory Visit: Payer: Self-pay | Admitting: Internal Medicine

## 2022-01-16 DIAGNOSIS — M791 Myalgia, unspecified site: Secondary | ICD-10-CM | POA: Diagnosis not present

## 2022-01-16 DIAGNOSIS — U071 COVID-19: Secondary | ICD-10-CM | POA: Diagnosis not present

## 2022-01-16 DIAGNOSIS — R52 Pain, unspecified: Secondary | ICD-10-CM | POA: Diagnosis not present

## 2022-01-17 ENCOUNTER — Telehealth: Payer: Self-pay | Admitting: Internal Medicine

## 2022-01-17 NOTE — Telephone Encounter (Signed)
Patient called to r/s her labs because she has tested Positive for COVID and she wanted the dr to know she was prescribed Paxlovid.

## 2022-01-17 NOTE — Telephone Encounter (Signed)
Pt stated her and her husband went to [facility] yesterday - both COVID positive. Sx started Saturday afternoon for both of them - cough, congestion, sore throat, pt had fever 100.1 yesterday.  Has not checked temps today. Both given Paxlovid and using tylenol and mucinex. Pt stating both her and her husband feeling better today, her sore throat better. Less congestion in both her and Ernie.   Both pt and her husband were advised by [facility] must be fever free without medication for 24hrs to come out of quarantine. I advised pt that is correct. Will re test this weekend.    Pt advised to keep [place] updated and let us know if they need anything.

## 2022-01-18 ENCOUNTER — Other Ambulatory Visit: Payer: Medicare HMO

## 2022-01-18 NOTE — Telephone Encounter (Signed)
If itching and feeling a sensation change in her throat, would recommend stopping paxlovid.  Confirm doing ok otherwise.  Confirm no increased sob, lip or tongue swelling,etc.   If any acute issues or change in symptoms, needs to be evaluated.

## 2022-01-18 NOTE — Telephone Encounter (Signed)
Noted.  Agree with keeping [place] update and let us know if any problems.

## 2022-01-18 NOTE — Telephone Encounter (Signed)
Pt called stating the cma advise her to call back if she needed anything

## 2022-01-18 NOTE — Telephone Encounter (Signed)
S/w pt - states face has started itching this morning and throat has become more sore/sensitive/scratchy than yesterday, worried it is from possible allergic reaction to paxlovid.  Stated read side effects, and stated itching of face and throat. Wanted your opinion if she should continue to take paxlovid.  Pt vitals : BP : 121/66 HR : 57 Temp : 97.2  O2 : 95

## 2022-01-18 NOTE — Telephone Encounter (Signed)
S/w pt - when asked if she had swelling in lips/tongue/throat pt did say her lips feel more "puffy" then usual, but wouldn't consider them swollen, more dry. No tingling sensation in lips. Tongue not swollen or tingling, no issues drinking or eating/swallowing. No sob  Pt will stop taking paxlovid and let us know if sx resolve.

## 2022-01-20 ENCOUNTER — Encounter: Payer: Self-pay | Admitting: Internal Medicine

## 2022-01-21 NOTE — Telephone Encounter (Signed)
S/w  - stated hasnt had a fever in days. Feeling much better, no chest tightness today. Only using mucinex and saline rinse for congestion and cough.  Would like to keep appointment for labs as is, pt is going away the week of the 20th, and then is scheduled for visit  8/31. Agreeable to hold on covid test for now.

## 2022-01-21 NOTE — Telephone Encounter (Signed)
Please call her and let her know that the covid test can remain positive for weeks.  Recommend quarantined  - quarantine guidelines.  May want to postpone labs pending when symptoms started, etc.  Also, if febrile and symptoms not improving, recommend to remain in quarantine until fever free for at least 24 hours without taking something to keep fever down.

## 2022-01-26 ENCOUNTER — Other Ambulatory Visit (INDEPENDENT_AMBULATORY_CARE_PROVIDER_SITE_OTHER): Payer: Medicare HMO

## 2022-01-26 DIAGNOSIS — R739 Hyperglycemia, unspecified: Secondary | ICD-10-CM

## 2022-01-26 DIAGNOSIS — E7800 Pure hypercholesterolemia, unspecified: Secondary | ICD-10-CM

## 2022-01-26 LAB — BASIC METABOLIC PANEL WITH GFR
BUN: 14 mg/dL (ref 6–23)
CO2: 29 meq/L (ref 19–32)
Calcium: 9.8 mg/dL (ref 8.4–10.5)
Chloride: 104 meq/L (ref 96–112)
Creatinine, Ser: 0.68 mg/dL (ref 0.40–1.20)
GFR: 84.33 mL/min
Glucose, Bld: 92 mg/dL (ref 70–99)
Potassium: 3.9 meq/L (ref 3.5–5.1)
Sodium: 138 meq/L (ref 135–145)

## 2022-01-26 LAB — HEPATIC FUNCTION PANEL
ALT: 15 U/L (ref 0–35)
AST: 20 U/L (ref 0–37)
Albumin: 4.4 g/dL (ref 3.5–5.2)
Alkaline Phosphatase: 57 U/L (ref 39–117)
Bilirubin, Direct: 0.1 mg/dL (ref 0.0–0.3)
Total Bilirubin: 0.4 mg/dL (ref 0.2–1.2)
Total Protein: 6.8 g/dL (ref 6.0–8.3)

## 2022-01-26 LAB — LIPID PANEL
Cholesterol: 241 mg/dL — ABNORMAL HIGH (ref 0–200)
HDL: 49.9 mg/dL
LDL Cholesterol: 154 mg/dL — ABNORMAL HIGH (ref 0–99)
NonHDL: 191.58
Total CHOL/HDL Ratio: 5
Triglycerides: 188 mg/dL — ABNORMAL HIGH (ref 0.0–149.0)
VLDL: 37.6 mg/dL (ref 0.0–40.0)

## 2022-01-26 LAB — HEMOGLOBIN A1C: Hgb A1c MFr Bld: 5.8 % (ref 4.6–6.5)

## 2022-02-10 ENCOUNTER — Encounter: Payer: Self-pay | Admitting: Internal Medicine

## 2022-02-10 ENCOUNTER — Ambulatory Visit (INDEPENDENT_AMBULATORY_CARE_PROVIDER_SITE_OTHER): Payer: Medicare HMO | Admitting: Internal Medicine

## 2022-02-10 DIAGNOSIS — R739 Hyperglycemia, unspecified: Secondary | ICD-10-CM

## 2022-02-10 DIAGNOSIS — I779 Disorder of arteries and arterioles, unspecified: Secondary | ICD-10-CM | POA: Diagnosis not present

## 2022-02-10 DIAGNOSIS — E7800 Pure hypercholesterolemia, unspecified: Secondary | ICD-10-CM

## 2022-02-10 DIAGNOSIS — Z860100 Personal history of colon polyps, unspecified: Secondary | ICD-10-CM

## 2022-02-10 DIAGNOSIS — E039 Hypothyroidism, unspecified: Secondary | ICD-10-CM

## 2022-02-10 DIAGNOSIS — F439 Reaction to severe stress, unspecified: Secondary | ICD-10-CM | POA: Diagnosis not present

## 2022-02-10 DIAGNOSIS — Z8616 Personal history of COVID-19: Secondary | ICD-10-CM | POA: Diagnosis not present

## 2022-02-10 DIAGNOSIS — K2100 Gastro-esophageal reflux disease with esophagitis, without bleeding: Secondary | ICD-10-CM

## 2022-02-10 DIAGNOSIS — R69 Illness, unspecified: Secondary | ICD-10-CM | POA: Diagnosis not present

## 2022-02-10 DIAGNOSIS — I700 Atherosclerosis of aorta: Secondary | ICD-10-CM

## 2022-02-10 NOTE — Progress Notes (Signed)
Patient ID: LINZEE DEPAUL, female   DOB: 12-22-1945, 76 y.o.   MRN: [id number]   Subjective:    Patient ID: [name], female    DOB: 1946/06/13, 76 y.o.   MRN: [id number]    Patient here for  Chief Complaint  Patient presents with   Follow-up   .   HPI Here to follow up regarding her cholesterol, GERD and diarrhea.  Recent covid infection.  Took paxlovid - partial - intolerance.  Persistent clearing of her throat.  No increased sob.  No increased chest congestion.  No chest pain.  Eating.  No vomiting.  Was having persistent diarrhea.  Saw GI.  She reported was told crestor contributing to diarrhea.  Stopped crestor - felt best.  After seeing labs, started back on crestor - at 55m dose.  Taking fiber.  Tolerating.     Past Medical History:  Diagnosis Date   Arthritis    Environmental allergies    Esophagitis    [place], followed by Dr NVennie Homans  GERD (gastroesophageal reflux disease)    Hypercholesterolemia    Hypothyroidism    Past Surgical History:  Procedure Laterality Date   ANKLE SURGERY  1967   KNEE ARTHROSCOPY  12/03/07   left   KNEE ARTHROSCOPY  06/25/08   left   NASAL SINUS SURGERY  12/06   REPLACEMENT TOTAL KNEE  01/04/09   left   RHINOPLASTY  1986   VAGINAL HYSTERECTOMY  1980   ovaries left in place   WRIST SURGERY  1970   cyst removal   Family History  Problem Relation Age of Onset   Heart disease Father        heart attack   Diabetes Father    Hyperlipidemia Paternal Grandmother    Diabetes Paternal Grandmother    Stroke Paternal Grandmother    Hyperlipidemia Paternal Grandfather    Diabetes Paternal Grandfather    Prostate cancer Maternal Uncle    Pancreatic cancer Cousin    Multiple myeloma Brother    Breast cancer Maternal Aunt    Breast cancer Maternal Aunt    Social History   Socioeconomic History   Marital status: Married    Spouse name: Not on file   Number of children: Not on file   Years of education: Not on file   Highest  education level: Not on file  Occupational History   Not on file  Tobacco Use   Smoking status: Former    Years: 5.00    Types: Cigarettes   Smokeless tobacco: Never  Vaping Use   Vaping Use: Never used  Substance and Sexual Activity   Alcohol use: Never    Comment: rare   Drug use: No   Sexual activity: Yes  Other Topics Concern   Not on file  Social History Narrative   Not on file   Social Determinants of Health   Financial Resource Strain: Low Risk  (02/22/2021)   Overall Financial Resource Strain (CARDIA)    Difficulty of Paying Living Expenses: Not hard at all  Food Insecurity: No Food Insecurity (02/22/2021)   Hunger Vital Sign    Worried About Running Out of Food in the Last Year: Never true    Ran Out of Food in the Last Year: Never true  Transportation Needs: No Transportation Needs (02/22/2021)   PRAPARE - THydrologist(Medical): No    Lack of Transportation (Non-Medical): No  Physical Activity: Insufficiently Active (02/22/2021)  Exercise Vital Sign    Days of Exercise per Week: 2 days    Minutes of Exercise per Session: 60 min  Stress: No Stress Concern Present (02/22/2021)   [place]    Feeling of Stress : Not at all  Social Connections: Unknown (02/22/2021)   Social Connection and Isolation Panel [NHANES]    Frequency of Communication with Friends and Family: More than three times a week    Frequency of Social Gatherings with Friends and Family: Not on file    Attends Religious Services: Not on file    Active Member of Clubs or Organizations: Not on file    Attends Archivist Meetings: Not on file    Marital Status: Married     Review of Systems  Constitutional:  Negative for appetite change and unexpected weight change.  HENT:  Negative for congestion and sinus pressure.   Respiratory:  Negative for cough, chest tightness and shortness of breath.    Cardiovascular:  Negative for chest pain, palpitations and leg swelling.  Gastrointestinal:  Negative for abdominal pain, nausea and vomiting.       Has had issues with diarrhea as outlined.   Genitourinary:  Negative for difficulty urinating and dysuria.  Musculoskeletal:  Negative for joint swelling and myalgias.  Skin:  Negative for color change and rash.  Neurological:  Negative for dizziness, light-headedness and headaches.  Psychiatric/Behavioral:  Negative for agitation and dysphoric mood.        Objective:     BP (!) 92/58 (BP Location: Left Arm, Patient Position: Sitting, Cuff Size: Normal)   Pulse 60   Temp (!) 97.4 F (36.3 C) (Oral)   Ht 5' 3"  (1.6 m)   Wt 141 lb (64 kg)   SpO2 95%   BMI 24.98 kg/m  Wt Readings from Last 3 Encounters:  02/10/22 141 lb (64 kg)  12/08/21 141 lb 9.6 oz (64.2 kg)  10/20/21 146 lb 6.4 oz (66.4 kg)    Physical Exam Vitals reviewed.  Constitutional:      General: She is not in acute distress.    Appearance: Normal appearance.  HENT:     Head: Normocephalic and atraumatic.     Right Ear: External ear normal.     Left Ear: External ear normal.  Eyes:     General: No scleral icterus.       Right eye: No discharge.        Left eye: No discharge.     Conjunctiva/sclera: Conjunctivae normal.  Neck:     Thyroid: No thyromegaly.  Cardiovascular:     Rate and Rhythm: Normal rate and regular rhythm.  Pulmonary:     Effort: No respiratory distress.     Breath sounds: Normal breath sounds. No wheezing.  Abdominal:     General: Bowel sounds are normal.     Palpations: Abdomen is soft.     Tenderness: There is no abdominal tenderness.  Musculoskeletal:        General: No swelling or tenderness.     Cervical back: Neck supple. No tenderness.  Lymphadenopathy:     Cervical: No cervical adenopathy.  Skin:    Findings: No erythema or rash.  Neurological:     Mental Status: She is alert.  Psychiatric:        Mood and Affect: Mood  normal.        Behavior: Behavior normal.      Outpatient Encounter Medications as of  02/10/2022  Medication Sig   acetaminophen (TYLENOL) 500 MG tablet Take 500 mg by mouth every 6 (six) hours as needed.   Ascorbic Acid (VITAMIN C) 500 MG CAPS Take 500 mg by mouth daily.   B Complex Vitamins (VITAMIN B-COMPLEX) TABS Take 1 tablet by mouth daily.   BUDESONIDE PO 2 (two) times daily.   busPIRone (BUSPAR) 5 MG tablet Take 5 mg by mouth daily as needed.   Cholecalciferol (VITAMIN D PO) Take 10,000 Units by mouth daily.    Coenzyme Q10 (COQ-10 PO) Take 120 mg by mouth daily.   COLLAGEN PO Take 11 mg by mouth daily.   diclofenac sodium (VOLTAREN) 1 % GEL Apply 1 application topically as needed.   ELDERBERRY PO Take 1 capsule by mouth daily. Takes one gummy daily   fexofenadine (ALLEGRA) 180 MG tablet Take 180 mg by mouth daily.   fluocinonide cream (LIDEX) 6.81 % Apply 1 application topically 2 (two) times daily as needed.    Hypertonic Nasal Wash (SINUS RINSE NA) Place into the nose as needed.   LEVOFLOXACIN PO 2 (two) times daily. levofloxacin 155m/ml cash  ADD 1ML OF MEDICATION TO 240ML OF SALINE IN SALINE IRRIGATION BOTTLE; IRRIGATE   levothyroxine (SYNTHROID) 50 MCG tablet TAKE 1 TABLET EVERY OTHER  DAY   levothyroxine (SYNTHROID) 75 MCG tablet TAKE 1 TABLET EVERY OTHER  DAY   NON FORMULARY DE 3 Omega Benefits TID   Omega-3 Fatty Acids (FISH OIL PO) Take 1,200 mg by mouth daily.   omeprazole (PRILOSEC) 40 MG capsule TAKE 1 CAPSULE DAILY   Probiotic Product (PROBIOTIC DAILY PO) Take 1 capsule by mouth.   rosuvastatin (CRESTOR) 10 MG tablet Take 1 tablet (10 mg total) by mouth daily.   sodium chloride irrigation 0.9 % irrigation sodium chloride 0.9 % irrigation solution   UNABLE TO FIND Take 1 Capful by mouth daily. Mega B-50   vitamin E 400 UNIT capsule Take 400 Units by mouth daily.   [DISCONTINUED] rosuvastatin (CRESTOR) 20 MG tablet Take 1 tablet (20 mg total) by mouth daily.  (Patient taking differently: Take 10 mg by mouth daily.)   [DISCONTINUED] methylPREDNISolone (MEDROL DOSEPAK) 4 MG TBPK tablet 6 day dose pack - take as directed (Patient not taking: Reported on 02/10/2022)   No facility-administered encounter medications on file as of 02/10/2022.     Lab Results  Component Value Date   WBC 6.0 10/18/2021   HGB 12.5 10/18/2021   HCT 36.6 10/18/2021   PLT 219.0 10/18/2021   GLUCOSE 92 01/26/2022   CHOL 241 (H) 01/26/2022   TRIG 188.0 (H) 01/26/2022   HDL 49.90 01/26/2022   LDLDIRECT 123.4 02/27/2013   LDLCALC 154 (H) 01/26/2022   ALT 15 01/26/2022   AST 20 01/26/2022   NA 138 01/26/2022   K 3.9 01/26/2022   CL 104 01/26/2022   CREATININE 0.68 01/26/2022   BUN 14 01/26/2022   CO2 29 01/26/2022   TSH 2.30 10/18/2021   HGBA1C 5.8 01/26/2022    MM 3D SCREEN BREAST BILATERAL  Result Date: 04/09/2021 CLINICAL DATA:  Screening. EXAM: DIGITAL SCREENING BILATERAL MAMMOGRAM WITH TOMOSYNTHESIS AND CAD TECHNIQUE: Bilateral screening digital craniocaudal and mediolateral oblique mammograms were obtained. Bilateral screening digital breast tomosynthesis was performed. The images were evaluated with computer-aided detection. COMPARISON:  Previous exam(s). ACR Breast Density Category b: There are scattered areas of fibroglandular density. FINDINGS: There are no findings suspicious for malignancy. IMPRESSION: No mammographic evidence of malignancy. A result letter of this screening mammogram will  be mailed directly to the patient. RECOMMENDATION: Screening mammogram in one year. (Code:SM-B-01Y) BI-RADS CATEGORY  1: Negative. Electronically Signed   By: Dorise Bullion III M.D.   On: 04/09/2021 18:33  DG Bone Density  Result Date: 04/08/2021 EXAM: DUAL X-RAY ABSORPTIOMETRY (DXA) FOR BONE MINERAL DENSITY IMPRESSION: Your patient AMORE ACKMAN completed a FRAX assessment on 04/08/2021 using the [place] (analysis version: 14.10) manufactured by [employer]. The following summarizes the results of our evaluation. PATIENT BIOGRAPHICAL: Name: Courtenay, Hirth Patient ID: [id number] Birth Date: 1945-09-14 Height:    63.0 in. Gender:     [age]    [age]       Weight:    139.7 lbs. Ethnicity:  White                            Exam Date: 04/08/2021 FRAX* RESULTS:  (version: 3.5) 10-year Probability of Fracture1 Major Osteoporotic Fracture2 Hip Fracture 23.1% 6.5% Population: [place] (Caucasian) Risk Factors: History of Fracture (Adult) Based on Femur (Right) Neck BMD 1 -The 10-year probability of fracture may be lower than reported if the patient has received treatment. 2 -Major Osteoporotic Fracture: Clinical Spine, Forearm, Hip or Shoulder *FRAX is a Materials engineer of the [employer] for Metabolic Bone Disease, a [place] (WHO) [facility]. ASSESSMENT: The probability of a major osteoporotic fracture is 23.1% within the next ten years. The probability of a hip fracture is 6.5% within the next ten years. . Your patient Nohemy Koop completed a BMD test on 04/08/2021 using the [place] (software version: 14.10) manufactured by [employer]. The following summarizes the results of our evaluation. Technologist: crr PATIENT BIOGRAPHICAL: Name: Jamyiah, Labella Patient ID: [id number] Birth Date: April 06, 1946 Height: 63.0 in. Gender: Female Exam Date: 04/08/2021 Weight: 139.7 lbs. Indications: Advanced Age, Caucasian, Early Menopause, Height Loss, History of Fracture (Adult), hx skin ca, Hypothyroid, Hysterectomy, left knee replacement, Low Body Weight, Postmenopausal Fractures: Left foot, Left lower leg, Right ankle, Right foot Treatments: Levothyroxine, Vitamin D DENSITOMETRY RESULTS: Site      Region     Measured Date Measured Age WHO Classification Young Adult T-score BMD         %Change vs. Previous Significant Change (*) AP Spine L2-L4 04/08/2020 76.0 Osteopenia -1.8 0.986 g/cm2  1.9% - AP Spine L2-L4 07/06/2015 70.3 Osteopenia -2.0 0.968 g/cm2 - - DualFemur Neck Right 04/08/2021 76.0 Osteopenia -2.2 0.725 g/cm2 -4.4% - DualFemur Neck Right 07/06/2015 70.3 Osteopenia -2.0 0.758 g/cm2 - - DualFemur Total Mean 04/08/2021 76.0 Osteopenia -1.5 0.817 g/cm2 -0.5% - DualFemur Total Mean 07/06/2015 70.3 Osteopenia -1.5 0.821 g/cm2 - - ASSESSMENT: The BMD measured at Femur Neck Right is 0.725 g/cm2 with a T-score of -2.2. This patient is considered osteopenic according to [facility]) criteria. L-1 was excluded due to degenerative changes. The scan quality is limited by exclusion of L-1. [facility]) criteria for post-menopausal, Caucasian Women: Normal:                   T-score at or above -1 SD Osteopenia/low bone mass: T-score between -1 and -2.5 SD Osteoporosis:             T-score at or below -2.5 SD RECOMMENDATIONS: 1. All patients should optimize calcium and vitamin D intake. 2. Consider FDA-approved medical therapies in postmenopausal women  and men aged 40 years and older, based on the following: a. A hip or vertebral(clinical or morphometric) fracture b. T-score < -2.5 at the femoral neck or spine after appropriate evaluation to exclude secondary causes c. Low bone mass (T-score between -1.0 and -2.5 at the femoral neck or spine) and a 10-year probability of a hip fracture > 3% or a 10-year probability of a major osteoporosis-related fracture > 20% based on the [place]-adapted WHO algorithm 3. Clinician judgment and/or patient preferences may indicate treatment for people with 10-year fracture probabilities above or below these levels FOLLOW-UP: People with diagnosed cases of osteoporosis or at high risk for fracture should have regular bone mineral density tests. For patients eligible for Medicare, routine testing is allowed once every 2 years. The testing frequency can be increased to one year for patients who have rapidly progressing disease, those who  are receiving or discontinuing medical therapy to restore bone mass, or have additional risk factors. I have reviewed this report, and agree with the above findings. [facility] Electronically Signed   By: Rolm Baptise M.D.   On: 04/08/2021 20:54       Assessment & Plan:   Problem List Items Addressed This Visit     Aortic atherosclerosis ([place])    Continue crestor.       Relevant Medications   rosuvastatin (CRESTOR) 10 MG tablet   Carotid artery disease (HCC)    Carotid ultrasound - right: 40-59% and left:  1-39%.  Continue statin therapy.  Carotid ultrasound (04/2021)      Relevant Medications   rosuvastatin (CRESTOR) 10 MG tablet   GERD (gastroesophageal reflux disease)    EGD:  11/21/19 - moderate diffuse gastritis - pathology - reactive gastropathy.  On PPI.        History of colonic polyps    Colonoscopy 01/2018.  Per pt, f/u colonoscopy recommended in 5 years.       History of COVID-19    Doing better.  Persistent clearing of her throat.  Check cxr.        Relevant Orders   DG Chest 2 View   Hypercholesteremia    Discussed today.  She felt crestor was contributing to her diarrhea.  Back on crestor now - 65m q day.  Follow lipid panel and liver function tests.        Relevant Medications   rosuvastatin (CRESTOR) 10 MG tablet   Hyperglycemia    Low carb diet and exercise.  Follow met b and a1c.       Hypothyroidism    On thyroid replacement.  Follow tsh.       Stress    Overall appears to be handling things relatively well.  Has buspar to take prn.  Follow.         CEinar Pheasant MD

## 2022-02-15 ENCOUNTER — Other Ambulatory Visit: Payer: Medicare HMO

## 2022-02-20 ENCOUNTER — Encounter: Payer: Self-pay | Admitting: Internal Medicine

## 2022-02-20 MED ORDER — ROSUVASTATIN CALCIUM 10 MG PO TABS: 10.0000 mg | ORAL_TABLET | Freq: Every day | ORAL | 0 refills | Status: DC

## 2022-02-20 NOTE — Assessment & Plan Note (Signed)
Continue crestor 

## 2022-02-20 NOTE — Assessment & Plan Note (Signed)
EGD:  11/21/19 - moderate diffuse gastritis - pathology - reactive gastropathy.  On PPI.   

## 2022-02-20 NOTE — Assessment & Plan Note (Signed)
Carotid ultrasound - right: 40-59% and left:  1-39%.  Continue statin therapy.  Carotid ultrasound (04/2021)

## 2022-02-20 NOTE — Assessment & Plan Note (Signed)
Overall appears to be handling things relatively well.  Has buspar to take prn.  Follow.  

## 2022-02-20 NOTE — Assessment & Plan Note (Signed)
Doing better.  Persistent clearing of her throat.  Check cxr.

## 2022-02-20 NOTE — Assessment & Plan Note (Addendum)
On thyroid replacement.  Follow tsh.  

## 2022-02-20 NOTE — Assessment & Plan Note (Signed)
Discussed today.  She felt crestor was contributing to her diarrhea.  Back on crestor now - '10mg'$  q day.  Follow lipid panel and liver function tests.

## 2022-02-20 NOTE — Assessment & Plan Note (Signed)
Colonoscopy 01/2018.  Per pt, f/u colonoscopy recommended in 5 years.  

## 2022-02-20 NOTE — Assessment & Plan Note (Signed)
Low carb diet and exercise.  Follow met b and a1c.  

## 2022-02-21 ENCOUNTER — Ambulatory Visit (INDEPENDENT_AMBULATORY_CARE_PROVIDER_SITE_OTHER): Payer: Medicare HMO

## 2022-02-21 ENCOUNTER — Other Ambulatory Visit: Payer: Medicare HMO

## 2022-02-21 DIAGNOSIS — J439 Emphysema, unspecified: Secondary | ICD-10-CM | POA: Diagnosis not present

## 2022-02-21 DIAGNOSIS — Z8616 Personal history of COVID-19: Secondary | ICD-10-CM

## 2022-02-25 ENCOUNTER — Telehealth: Payer: Self-pay | Admitting: Internal Medicine

## 2022-02-25 ENCOUNTER — Other Ambulatory Visit: Payer: Self-pay | Admitting: Internal Medicine

## 2022-02-25 DIAGNOSIS — R9389 Abnormal findings on diagnostic imaging of other specified body structures: Secondary | ICD-10-CM

## 2022-02-25 NOTE — Telephone Encounter (Signed)
Lft pt vm to call ofc to sch CT chest. thanks

## 2022-02-25 NOTE — Progress Notes (Signed)
Order placed for HRCT 

## 2022-02-28 ENCOUNTER — Ambulatory Visit: Payer: Medicare HMO

## 2022-03-09 ENCOUNTER — Ambulatory Visit
Admission: RE | Admit: 2022-03-09 | Discharge: 2022-03-09 | Disposition: A | Discharge status: Home / Self Care | Payer: Medicare HMO | Source: Ambulatory Visit | Attending: Internal Medicine | Admitting: Internal Medicine

## 2022-03-09 DIAGNOSIS — J479 Bronchiectasis, uncomplicated: Secondary | ICD-10-CM | POA: Diagnosis not present

## 2022-03-09 DIAGNOSIS — R9389 Abnormal findings on diagnostic imaging of other specified body structures: Secondary | ICD-10-CM | POA: Diagnosis present

## 2022-03-09 DIAGNOSIS — I2510 Atherosclerotic heart disease of native coronary artery without angina pectoris: Secondary | ICD-10-CM | POA: Diagnosis not present

## 2022-03-09 DIAGNOSIS — R918 Other nonspecific abnormal finding of lung field: Secondary | ICD-10-CM | POA: Diagnosis not present

## 2022-03-09 DIAGNOSIS — I700 Atherosclerosis of aorta: Secondary | ICD-10-CM | POA: Diagnosis not present

## 2022-03-10 ENCOUNTER — Other Ambulatory Visit: Payer: Self-pay | Admitting: Internal Medicine

## 2022-03-10 DIAGNOSIS — Z1231 Encounter for screening mammogram for malignant neoplasm of breast: Secondary | ICD-10-CM

## 2022-03-12 ENCOUNTER — Other Ambulatory Visit: Payer: Self-pay | Admitting: Internal Medicine

## 2022-03-12 DIAGNOSIS — I2510 Atherosclerotic heart disease of native coronary artery without angina pectoris: Secondary | ICD-10-CM

## 2022-03-12 DIAGNOSIS — R9389 Abnormal findings on diagnostic imaging of other specified body structures: Secondary | ICD-10-CM

## 2022-03-12 NOTE — Progress Notes (Signed)
Orders placed for pulmonary and cardiology referral.

## 2022-03-17 DIAGNOSIS — H6121 Impacted cerumen, right ear: Secondary | ICD-10-CM | POA: Diagnosis not present

## 2022-03-17 DIAGNOSIS — H903 Sensorineural hearing loss, bilateral: Secondary | ICD-10-CM | POA: Diagnosis not present

## 2022-03-17 DIAGNOSIS — R591 Generalized enlarged lymph nodes: Secondary | ICD-10-CM | POA: Diagnosis not present

## 2022-03-17 DIAGNOSIS — H6063 Unspecified chronic otitis externa, bilateral: Secondary | ICD-10-CM | POA: Diagnosis not present

## 2022-03-29 DIAGNOSIS — H903 Sensorineural hearing loss, bilateral: Secondary | ICD-10-CM | POA: Diagnosis not present

## 2022-04-05 ENCOUNTER — Ambulatory Visit: Payer: Medicare HMO | Admitting: Pulmonary Disease

## 2022-04-05 ENCOUNTER — Encounter: Payer: Self-pay | Admitting: Pulmonary Disease

## 2022-04-05 VITALS — BP 118/82 | HR 58 | Temp 97.8°F | Ht 63.0 in | Wt 139.6 lb

## 2022-04-05 DIAGNOSIS — R0602 Shortness of breath: Secondary | ICD-10-CM

## 2022-04-05 DIAGNOSIS — R918 Other nonspecific abnormal finding of lung field: Secondary | ICD-10-CM | POA: Diagnosis not present

## 2022-04-05 DIAGNOSIS — J8410 Pulmonary fibrosis, unspecified: Secondary | ICD-10-CM | POA: Diagnosis not present

## 2022-04-05 NOTE — Progress Notes (Signed)
Subjective:    Patient ID: [name], female    DOB: May 09, 1946, 76 y.o.   MRN: [id number] Patient Care Team: Dale Big Coppitt Key, MD as PCP - General (Internal Medicine)  Chief Complaint  Patient presents with   Consult    Lung Nodule. CT on 03/09/22. No SOB, wheezing or cough.   HPI The patient is a 76 year old remote former smoker with minimal tobacco exposure who presents for evaluation of multiple lung nodules noted on CT chest of 09 March 2022.  She is kindly referred by Dr. Dale [place].  The patient states that she had a very severe case of COVID-19 on 25 November of 2020 this was manifested by severe cough and chest congestion.  She will cough every time she would try to take a deep breath.  Had fevers and chills.  She had anosmia and dysgeusia as well.  She felt significant chest tightness and shortness of breath.  She did not require hospitalization, however.  Does COVID her cough lingered.  A chest x-ray performed December 2020 did not show any evidence of fibrosis or any abnormality.  She had another bout of viral illness in September 2022 which started as sinus symptoms and chest congestion.  She felt that her breathing was very shallow at that time.  It is believed that at that time she may have had RSV.  She states that COVID testing x 2 was negative at that time.  She recovered from that episode without incident.  She then had another bout of COVID in August 2023 and was treated with Paxlovid but this did not help and actually made her sore throat symptoms worse.  Because of persistent cough and persistent clearing, a chest x-ray was performed it was read as "emphysema" and septal thickening.  This triggered a high-resolution CT chest to be performed.  This study was performed on 09 March 2022 and showed scattered areas of septal thickening, mild cylindrical bronchiectasis and peripheral bronchiolectasis.  It was mild subpleural reticulation noted.  No frank honeycombing.   Patient also had multiple pulmonary nodules averaging 5 mm in diameter.  We are asked to render opinion on these finding.  Is her most recent COVID illness the patient has noted improvement.  She has had some dyspnea only on heavy exertion.  This is not new.  For the most part though she still stays active with yoga and walking.  As noted her smoking history is very remote and she has 5-pack-year history or less of smoking.  Was in her 34s.  Since her most recent viral illness she has not had any cough or sputum production.  No chest pain.  No orthopnea or paroxysmal nocturnal dyspnea.  No lower extremity edema or calf tenderness.  She does not endorse any other symptomatology.  Overall she feels well and looks well.  She has had positive ANA previously.  She has chronic dry eye and has positive Sjogren serology.  Had been noticed in the past to have a positive ANA but this has not panned out.  She has been evaluated by Dr. Allena Katz, rheumatology at [facility].  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  No manufacturing or environmental occupational exposure.  Medications were reviewed.  She is not on Macrodantin.  Review of Systems A 10 point review of systems was performed and it is as noted above otherwise negative.  Past Medical History:  Diagnosis Date   Arthritis  Environmental allergies    Esophagitis    Barrets, followed by Dr Elsie Amis   GERD (gastroesophageal reflux disease)    Hypercholesterolemia    Hypothyroidism    Past Surgical History:  Procedure Laterality Date   ANKLE SURGERY  1967   KNEE ARTHROSCOPY  12/03/07   left   KNEE ARTHROSCOPY  06/25/08   left   NASAL SINUS SURGERY  12/06   REPLACEMENT TOTAL KNEE  01/04/09   left   RHINOPLASTY  1986   VAGINAL HYSTERECTOMY  1980   ovaries left in place   WRIST SURGERY  1970   cyst removal   Patient Active Problem List   Diagnosis Date Noted   History of  COVID-19 02/10/2022   Pain in both feet 10/31/2021   Allergic rhinitis 10/29/2021   Joint pain 06/20/2021   TMJ arthralgia 06/20/2021   Numbness of finger 06/20/2021   Carotid artery disease (HCC) 04/21/2021   Left carotid bruit 03/03/2021   Aortic atherosclerosis (HCC) 10/21/2020   Hyperglycemia 04/12/2020   Change in bowel habits 01/16/2020   Hot flashes 09/23/2019   Sinusitis 05/21/2019   Left hip pain 02/19/2019   Axillary fullness 02/19/2019   Lymphadenopathy 10/17/2018   Neck pain 04/29/2018   Swelling of right hand 12/24/2017   Loose stools 08/02/2015   Health care maintenance 01/29/2015   Right foot pain 02/17/2014   History of colonic polyps 05/07/2013   Stress 02/27/2013   Hypercholesteremia 03/26/2012   Hypothyroidism 03/26/2012   GERD (gastroesophageal reflux disease) 03/26/2012   Family History  Problem Relation Age of Onset   Heart disease Father        heart attack   Diabetes Father    Hyperlipidemia Paternal Grandmother    Diabetes Paternal Grandmother    Stroke Paternal Grandmother    Hyperlipidemia Paternal Grandfather    Diabetes Paternal Grandfather    Prostate cancer Maternal Uncle    Pancreatic cancer Cousin    Multiple myeloma Brother    Breast cancer Maternal Aunt    Breast cancer Maternal Aunt    Social History   Tobacco Use   Smoking status: Former    Years: 5.00    Types: Cigarettes   Smokeless tobacco: Never  Substance Use Topics   Alcohol use: Never    Comment: rare   Allergies  Allergen Reactions   Paxlovid [Nirmatrelvir-Ritonavir] Other (See Comments)    Severe sore throat   Diclofenac Nausea Only   Ibuprofen Other (See Comments)    Stomach issues   Tramadol Nausea Only   Hydromorphone Rash   Oxycodone Other (See Comments)    constipation   Current Meds  Medication Sig   acetaminophen (TYLENOL) 500 MG tablet Take 500 mg by mouth every 6 (six) hours as needed.   Ascorbic Acid (VITAMIN C) 500 MG CAPS Take 500 mg by  mouth daily.   B Complex Vitamins (VITAMIN B-COMPLEX) TABS Take 1 tablet by mouth daily.   BUDESONIDE PO 2 (two) times daily.   busPIRone (BUSPAR) 5 MG tablet Take 5 mg by mouth daily as needed.   Cholecalciferol (VITAMIN D PO) Take 10,000 Units by mouth daily.    Coenzyme Q10 (COQ-10 PO) Take 120 mg by mouth daily.   COLLAGEN PO Take 11 mg by mouth daily.   diclofenac sodium (VOLTAREN) 1 % GEL Apply 1 application topically as needed.   ELDERBERRY PO Take 1 capsule by mouth daily. Takes one gummy daily   fexofenadine (ALLEGRA) 180 MG tablet Take 180  mg by mouth daily.   fluocinonide cream (LIDEX) 0.05 % Apply 1 application topically 2 (two) times daily as needed.    Hypertonic Nasal Wash (SINUS RINSE NA) Place into the nose as needed.   LEVOFLOXACIN PO 2 (two) times daily. levofloxacin /ml cash  ADD OF MEDICATION TO OF SALINE IN SALINE IRRIGATION BOTTLE; IRRIGATE   levothyroxine (SYNTHROID) 50 MCG tablet TAKE 1 TABLET EVERY OTHER  DAY   levothyroxine (SYNTHROID) 75 MCG tablet TAKE 1 TABLET EVERY OTHER  DAY   NON FORMULARY DE 3 Omega Benefits TID   Omega-3 Fatty Acids (FISH OIL PO) Take 1,200 mg by mouth daily.   omeprazole (PRILOSEC) 40 MG capsule TAKE 1 CAPSULE DAILY   Probiotic Product (PROBIOTIC DAILY PO) Take 1 capsule by mouth.   rosuvastatin (CRESTOR) 10 MG tablet Take 1 tablet (10 mg total) by mouth daily.   sodium chloride irrigation 0.9 % irrigation sodium chloride 0.9 % irrigation solution   UNABLE TO FIND Take 1 Capful by mouth daily. Mega B-50   vitamin E 400 UNIT capsule Take 400 Units by mouth daily.   Immunization History  Administered Date(s) Administered   Fluad Quad(high Dose 65+) 02/19/2019, 04/07/2020, 02/22/2021   Influenza Split 03/26/2012   Influenza, High Dose Seasonal PF 03/07/2017, 03/09/2018   Influenza,inj,Quad PF,6+ Mos 02/25/2013, 02/14/2014, 03/26/2015, 02/02/2016   Influenza,inj,quad, With Preservative 03/13/2017, 03/13/2018    PFIZER(Purple Top)SARS-COV-2 Vaccination 10/18/2019, 11/12/2019   Pneumococcal Conjugate-13 04/22/2015   Pneumococcal Polysaccharide-23 01/24/2011, 06/14/2015   Tdap 11/17/2015       Objective:   Physical Exam BP 118/82 (BP Location: Left Arm, Cuff Size: Normal)   Pulse (!) 58   Temp 97.8 F (36.6 C)   Ht  (1.6 m)   Wt 139 lb 9.6 oz (63.3 kg)   SpO2 99%   BMI 24.73 kg/m  GENERAL: Well-developed, well-nourished woman, no acute distress, fully ambulatory, no conversational dyspnea. HEAD: Normocephalic, atraumatic.  EYES: Pupils equal, round, reactive to light.  No scleral icterus.  MOUTH: Dentition intact, oral mucosa moist. NECK: Supple. No thyromegaly. Trachea midline. No JVD.  No adenopathy. PULMONARY: Good air entry bilaterally.  No adventitious sounds. CARDIOVASCULAR: S1 and S2. Regular rate and rhythm.  ABDOMEN: Benign. MUSCULOSKELETAL: No joint deformity, no clubbing, no edema.  NEUROLOGIC: No overt focal deficit, no gait disturbance, speech is fluent. SKIN: Intact,warm,dry. PSYCH: Mood and behavior normal  I have reviewed the patient's chest independently findings which are as noted below:  IMPRESSION: 1. The appearance of the lungs does suggest interstitial lung disease, with a spectrum of findings considered probable usual interstitial pneumonia (UIP) per current ATS guidelines. At this time, findings are rather mild, but repeat high-resolution chest CT is recommended in 12 months to assess for temporal changes in the appearance of the lung parenchyma. Outpatient referral to Pulmonology for further clinical evaluation is also recommended. 2. Small pulmonary nodules measuring 5 mm or less in size, nonspecific and statistically likely benign. No follow-up needed if patient is low-risk (and has no known or suspected primary neoplasm). Non-contrast chest CT can be considered in 12 months if patient is high-risk. This recommendation follows the  consensus statement: Guidelines for Management of Incidental Pulmonary Nodules Detected on CT Images: From the Fleischner Society 2017; Radiology 2017; 284:228-243. 3. Aortic atherosclerosis, in addition to left anterior descending coronary artery disease. Assessment for potential risk factor modification, dietary therapy or pharmacologic therapy may be warranted, if clinically indicated.    Assessment & Plan:  ICD-10-CM   1. 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 Multiple lung nodules  R91.8 CT Chest High Resolution   Repeat CT chest 6 months    3. Shortness of breath  R06.02 Pulmonary Function Test [facility] Only   Evaluating with PFTs     Orders Placed This Encounter  Procedures   CT Chest High Resolution    Needs CT done in 6 months.    Standing Status:   Future    Number of Occurrences:   1    Standing Expiration Date:   04/06/2023    Order Specific Question:   Preferred imaging location?    Answer:   [facility]   Pulmonary Function Test [facility] Only    Standing Status:   Future    Number of Occurrences:   1    Standing Expiration Date:   04/06/2023    Order Specific Question:   Full PFT: includes the following: basic spirometry, spirometry pre & post bronchodilator, diffusion capacity (DLCO), lung volumes    Answer:   Full PFT    Order Specific Question:   This test can only be performed at    Answer:   [facility]   I suspect that the patient has postinflammatory pulmonary fibrosis.  Possibilities include Sjogren's related ILD.  Multiple lung nodules are very small and averaging 5 mm in size.  Suspect that these are benign.  We will repeat high-resolution CT chest in 6 months time.  For evaluation of the patient's shortness of breath we will obtain PFTs.  We will see the patient in follow-up after the  studies are completed.  She is to contact us prior to that time should any new difficulties arise.   Gailen Shelter, MD Advanced Bronchoscopy [facility]    *This note was dictated using voice recognition software/Dragon.  Despite best efforts to proofread, errors can occur which can change the meaning. Any transcriptional errors that result from this process are unintentional and may not be fully corrected at the time of dictation.

## 2022-04-05 NOTE — Patient Instructions (Signed)
Going to get some breathing tests.  We are going to repeat the CT chest in 6 months time, will see in follow-up after the CT chest is done.

## 2022-04-11 ENCOUNTER — Encounter (INDEPENDENT_AMBULATORY_CARE_PROVIDER_SITE_OTHER): Payer: Self-pay

## 2022-04-12 ENCOUNTER — Ambulatory Visit
Admission: RE | Admit: 2022-04-12 | Discharge: 2022-04-12 | Disposition: A | Discharge status: Home / Self Care | Payer: Medicare HMO | Source: Ambulatory Visit | Attending: Internal Medicine | Admitting: Internal Medicine

## 2022-04-12 DIAGNOSIS — Z1231 Encounter for screening mammogram for malignant neoplasm of breast: Secondary | ICD-10-CM | POA: Diagnosis present

## 2022-04-18 ENCOUNTER — Ambulatory Visit (INDEPENDENT_AMBULATORY_CARE_PROVIDER_SITE_OTHER): Payer: Medicare HMO

## 2022-04-18 VITALS — Ht 63.0 in | Wt 139.0 lb

## 2022-04-18 DIAGNOSIS — Z0000 Encounter for general adult medical examination without abnormal findings: Secondary | ICD-10-CM

## 2022-04-18 NOTE — Progress Notes (Signed)
Subjective:   [name] is a 76 y.o. female who presents for Medicare Annual (Subsequent) preventive examination.  Review of Systems    No ROS.  Medicare Wellness Virtual Visit.  Visual/audio telehealth visit, UTA vital signs.   See social history for additional risk factors.   Cardiac Risk Factors include: advanced age (>33mn, >>21women)     Objective:    Today's Vitals   04/18/22 1534  Weight: 139 lb (63 kg)  Height: _0  (1.6 m)   Body mass index is 24.62 kg/m.     04/18/2022    3:32 PM 02/22/2021   10:53 AM 02/20/2020   11:52 AM 02/19/2019   11:53 AM 09/18/2017    9:25 AM 08/02/2016    2:24 PM 05/27/2015   11:46 AM  Advanced Directives  Does Patient Have a Medical Advance Directive? _1  Yes Yes  Type of AParamedicof AHometownLiving will [place]Living will [place]Living will Living will;Healthcare Power of [place] will Living will;Healthcare Power of [place]Living will  Does patient want to make changes to medical advance directive? No - Patient declined No - Patient declined No - Patient declined No - Patient declined No - Patient declined No - Patient declined No - Patient declined  Copy of [place]in Chart? Yes - validated most recent copy scanned in chart (See row information) Yes - validated most recent copy scanned in chart (See row information) No - copy requested Yes - validated most recent copy scanned in chart (See row information) No - copy requested No - copy requested No - copy requested    Current Medications (verified) Outpatient Encounter Medications as of 04/18/2022  Medication Sig   acetaminophen (TYLENOL) 500 MG tablet Take 500 mg by mouth every 6 (six) hours as needed.   Ascorbic Acid (VITAMIN C) 500 MG CAPS Take 500 mg by mouth daily.   B Complex Vitamins (VITAMIN B-COMPLEX)  TABS Take 1 tablet by mouth daily.   BUDESONIDE PO 2 (two) times daily.   busPIRone (BUSPAR) 5 MG tablet Take 5 mg by mouth daily as needed.   Cholecalciferol (VITAMIN D PO) Take 10,000 Units by mouth daily.    Coenzyme Q10 (COQ-10 PO) Take 120 mg by mouth daily.   COLLAGEN PO Take 11 mg by mouth daily.   diclofenac sodium (VOLTAREN) 1 % GEL Apply 1 application topically as needed.   ELDERBERRY PO Take 1 capsule by mouth daily. Takes one gummy daily   fexofenadine (ALLEGRA) 180 MG tablet Take 180 mg by mouth daily.   fluocinonide cream (LIDEX) 09.16% Apply 1 application topically 2 (two) times daily as needed.    Hypertonic Nasal Wash (SINUS RINSE NA) Place into the nose as needed.   LEVOFLOXACIN PO 2 (two) times daily. levofloxacin 1072mml cash  ADD 1ML OF MEDICATION TO 240ML OF SALINE IN SALINE IRRIGATION BOTTLE; IRRIGATE   levothyroxine (SYNTHROID) 50 MCG tablet TAKE 1 TABLET EVERY OTHER  DAY   levothyroxine (SYNTHROID) 75 MCG tablet TAKE 1 TABLET EVERY OTHER  DAY   NON FORMULARY DE 3 Omega Benefits TID   Omega-3 Fatty Acids (FISH OIL PO) Take 1,200 mg by mouth daily.   omeprazole (PRILOSEC) 40 MG capsule TAKE 1 CAPSULE DAILY   Probiotic Product (PROBIOTIC DAILY PO) Take 1 capsule by mouth.   rosuvastatin (CRESTOR) 10 MG tablet Take 1 tablet (10 mg  total) by mouth daily.   sodium chloride irrigation 0.9 % irrigation sodium chloride 0.9 % irrigation solution   UNABLE TO FIND Take 1 Capful by mouth daily. Mega B-50   vitamin E 400 UNIT capsule Take 400 Units by mouth daily.   No facility-administered encounter medications on file as of 04/18/2022.    Allergies (verified) Paxlovid [nirmatrelvir-ritonavir], Diclofenac, Ibuprofen, Tramadol, Hydromorphone, and Oxycodone   History: Past Medical History:  Diagnosis Date   Arthritis    Environmental allergies    Esophagitis    Barrets, followed by Dr Vennie Homans   GERD (gastroesophageal reflux disease)    Hypercholesterolemia     Hypothyroidism    Past Surgical History:  Procedure Laterality Date   ANKLE SURGERY  1967   KNEE ARTHROSCOPY  12/03/07   left   KNEE ARTHROSCOPY  06/25/08   left   NASAL SINUS SURGERY  12/06   REPLACEMENT TOTAL KNEE  01/04/09   left   RHINOPLASTY  1986   VAGINAL HYSTERECTOMY  1980   ovaries left in place   WRIST SURGERY  1970   cyst removal   Family History  Problem Relation Age of Onset   Heart disease Father        heart attack   Diabetes Father    Multiple myeloma Brother    Breast cancer Maternal Aunt    Breast cancer Maternal Aunt    Prostate cancer Maternal Uncle    Hyperlipidemia Paternal Grandmother    Diabetes Paternal Grandmother    Stroke Paternal Grandmother    Hyperlipidemia Paternal Grandfather    Diabetes Paternal Grandfather    Pancreatic cancer Cousin    Social History   Socioeconomic History   Marital status: Married    Spouse name: Not on file   Number of children: Not on file   Years of education: Not on file   Highest education level: Not on file  Occupational History   Not on file  Tobacco Use   Smoking status: Former    Packs/day: 0.25    Years: 5.00    Total pack years: 1.25    Types: Cigarettes   Smokeless tobacco: Never   Tobacco comments:    Smoked a pack a week in her early 20's. Smoked for 5 years.  Vaping Use   Vaping Use: Never used  Substance and Sexual Activity   Alcohol use: Never    Comment: rare   Drug use: No   Sexual activity: Yes  Other Topics Concern   Not on file  Social History Narrative   Not on file   Social Determinants of Health   Financial Resource Strain: Low Risk  (04/18/2022)   Overall Financial Resource Strain (CARDIA)    Difficulty of Paying Living Expenses: Not hard at all  Food Insecurity: No Food Insecurity (04/18/2022)   Hunger Vital Sign    Worried About Running Out of Food in the Last Year: Never true    Ran Out of Food in the Last Year: Never true  Transportation Needs: No Transportation  Needs (04/18/2022)   PRAPARE - Hydrologist (Medical): No    Lack of Transportation (Non-Medical): No  Physical Activity: Insufficiently Active (02/22/2021)   Exercise Vital Sign    Days of Exercise per Week: 2 days    Minutes of Exercise per Session: 60 min  Stress: No Stress Concern Present (04/18/2022)   [place]    Feeling of  Stress : Not at all  Social Connections: Unknown (04/18/2022)   Social Connection and Isolation Panel [NHANES]    Frequency of Communication with Friends and Family: More than three times a week    Frequency of Social Gatherings with Friends and Family: Not on file    Attends Religious Services: Not on file    Active Member of Clubs or Organizations: Not on file    Attends Archivist Meetings: Not on file    Marital Status: Married    Tobacco Counseling Counseling given: Not Answered Tobacco comments: Smoked a pack a week in her early 20's. Smoked for 5 years.   Clinical Intake:  Pre-visit preparation completed: Yes        Diabetes: No  How often do you need to have someone help you when you read instructions, pamphlets, or other written materials from your doctor or pharmacy?: 1 - Never   Interpreter Needed?: No      Activities of Daily Living    04/18/2022    3:43 PM  In your present state of health, do you have any difficulty performing the following activities:  Hearing? 0  Vision? 0  Difficulty concentrating or making decisions? 0  Walking or climbing stairs? 1  Comment Arthritis in both feet. Paces self with activity.  Dressing or bathing? 0  Doing errands, shopping? 0  Preparing Food and eating ? N  Using the Toilet? N  In the past six months, have you accidently leaked urine? N  Do you have problems with loss of bowel control? N  Managing your Medications? N  Managing your Finances? N  Housekeeping or managing your  Housekeeping? N    Patient Care Team: Einar Pheasant, MD as PCP - General (Internal Medicine) Tyler Pita, MD as Consulting Physician (Pulmonary Disease)  Indicate any recent Medical Services you may have received from other than [facility] providers in the past year (date may be approximate).     Assessment:   This is a routine wellness examination for Viney.  I connected with  [name] on 04/18/22 by a audio enabled telemedicine application and verified that I am speaking with the correct person using two identifiers.  Patient Location: Home  Provider Location: Office/Clinic  I discussed the limitations of evaluation and management by telemedicine. The patient expressed understanding and agreed to proceed.   Hearing/Vision screen Hearing Screening - Comments:: Patient is able to hear conversational tones without difficulty. No issues reported.  Vision Screening - Comments:: Followed by Dr. Ellin Mayhew Wears corrective lenses They have regular follow up with the ophthalmologist      Dietary issues and exercise activities discussed: Current Exercise Habits: Home exercise routine, Type of exercise: yoga, Time (Minutes): 60, Frequency (Times/Week): 2, Weekly Exercise (Minutes/Week): 120, Intensity: Mild Healthy diet Good water intake   Goals Addressed             This Visit's Progress    Reduce sugar intake   On track      Depression Screen    04/18/2022    3:34 PM 12/08/2021   11:41 AM 10/20/2021    9:08 AM 06/18/2021   11:40 AM 03/15/2021    3:48 PM 02/22/2021   10:38 AM 02/20/2020   11:36 AM  PHQ 2/9 Scores  PHQ - 2 Score 0 0 0 0 0 0 0    Fall Risk    04/18/2022    3:42 PM 12/08/2021   11:41 AM 10/20/2021    9:08  AM 06/18/2021   11:40 AM 03/15/2021    3:48 PM  Fall Risk   Falls in the past year? 0 0 0 0 0  Number falls in past yr: 0   0 0  Injury with Fall? 0   0 0  Risk for fall due to : No Fall Risks No Fall Risks No Fall Risks    Follow up Falls  evaluation completed;Falls prevention discussed Falls evaluation completed Falls evaluation completed Falls evaluation completed Falls evaluation completed    FALL RISK PREVENTION PERTAINING TO THE HOME: Home free of loose throw rugs in walkways, pet beds, electrical cords, etc? Yes  Adequate lighting in your home to reduce risk of falls? Yes   ASSISTIVE DEVICES UTILIZED TO PREVENT FALLS: Life alert? No  Use of a cane, walker or w/c? No   TIMED UP AND GO: Was the test performed? No .    Cognitive Function:    09/18/2017    9:36 AM 08/02/2016    2:50 PM 05/27/2015   11:55 AM  MMSE - Mini Mental State Exam  Orientation to time _0 Orientation to Place _1 Registration _2 Attention/ Calculation _3 Recall _4 Language- name 2 objects _5 Language- repeat _6 Language- follow 3 step command _7 Language- read & follow direction _8 Write a sentence _9 Copy design _10 Total score _11 04/18/2022    3:48 PM 02/19/2019   11:55 AM  6CIT Screen  What Year? 0 points 0 points  What month? 0 points 0 points  What time? 0 points 0 points  Count back from 20 0 points 0 points  Months in reverse 0 points 0 points  Repeat phrase 0 points 0 points  Total Score 0 points 0 points    Immunizations Immunization History  Administered Date(s) Administered   Fluad Quad(high Dose 65+) 02/19/2019, 04/07/2020, 02/22/2021   Influenza Split 03/26/2012   Influenza, High Dose Seasonal PF 03/07/2017, 03/09/2018   Influenza,inj,Quad PF,6+ Mos 02/25/2013, 02/14/2014, 03/26/2015, 02/02/2016   Influenza,inj,quad, With Preservative 03/13/2017, 03/13/2018   PFIZER(Purple Top)SARS-COV-2 Vaccination 10/18/2019, 11/12/2019   Pneumococcal Conjugate-13 04/22/2015   Pneumococcal Polysaccharide-23 01/24/2011, 06/14/2015   Tdap 11/17/2015   Covid-19 vaccine status: Completed vaccines x2.   Shingrix Completed?: No.    Education has been provided regarding the  importance of this vaccine. Patient has been advised to call insurance company to determine out of pocket expense if they have not yet received this vaccine. Advised may also receive vaccine at local pharmacy or Health Dept. Verbalized acceptance and understanding.  Screening Tests Health Maintenance  Topic Date Due   COVID-19 Vaccine (3 - Pfizer series) 05/04/2022 (Originally 01/07/2020)   Zoster Vaccines- Shingrix (1 of 2) 07/19/2022 (Originally 03/12/1995)   INFLUENZA VACCINE  09/11/2022 (Originally 01/11/2022)   COLONOSCOPY (Pts 45-10yr Insurance coverage will need to be confirmed)  01/16/2023   MAMMOGRAM  04/13/2023   Medicare Annual Wellness (AWV)  04/19/2023   TETANUS/TDAP  11/16/2025   Pneumonia Vaccine 76 Years old  Completed   DEXA SCAN  Completed   Hepatitis C Screening  Completed   HPV VACCINES  Aged Out   Health Maintenance There are no preventive care reminders to display for this patient.  CT Chest High Resolution- completed 03/10/22.  Hepatitis C  Screening: Completed 2017.  Vision Screening: Recommended annual ophthalmology exams for early detection of glaucoma and other disorders of the eye.  Dental Screening: Recommended annual dental exams for proper oral hygiene.  Community Resource Referral / Chronic Care Management: CRR required this visit?  No   CCM required this visit?  No      Plan:     I have personally reviewed and noted the following in the patient's chart:   Medical and social history Use of alcohol, tobacco or illicit drugs  Current medications and supplements including opioid prescriptions. Patient is not currently taking opioid prescriptions. Functional ability and status Nutritional status Physical activity Advanced directives List of other physicians Hospitalizations, surgeries, and ER visits in previous 12 months Vitals Screenings to include cognitive, depression, and falls Referrals and appointments  In addition, I have reviewed  and discussed with patient certain preventive protocols, quality metrics, and best practice recommendations. A written personalized care plan for preventive services as well as general preventive health recommendations were provided to patient.     Leta Jungling, LPN   49/07/98

## 2022-04-18 NOTE — Patient Instructions (Addendum)
[name] , Thank you for taking time to come for your Medicare Wellness Visit. I appreciate your ongoing commitment to your health goals. Please review the following plan we discussed and let me know if I can assist you in the future.   These are the goals we discussed:  Goals      Reduce sugar intake        This is a list of the screening recommended for you and due dates:  Health Maintenance  Topic Date Due   COVID-19 Vaccine (3 - Pfizer series) 05/04/2022*   Zoster (Shingles) Vaccine (1 of 2) 07/19/2022*   Flu Shot  09/11/2022*   Colon Cancer Screening  01/16/2023   Mammogram  04/13/2023   Medicare Annual Wellness Visit  04/19/2023   Tetanus Vaccine  11/16/2025   Pneumonia Vaccine  Completed   DEXA scan (bone density measurement)  Completed   Hepatitis C Screening: USPSTF Recommendation to screen - Ages 6-79 yo.  Completed   HPV Vaccine  Aged Out  *Topic was postponed. The date shown is not the original due date.    Advanced directives: End of life planning; Advance aging; Advanced directives discussed.  Copy of current HCPOA/Living Will requested.    Conditions/risks identified: none new  Next appointment: Follow up in one year for your annual wellness visit    Preventive Care 65 Years and Older, Female Preventive care refers to lifestyle choices and visits with your health care provider that can promote health and wellness. What does preventive care include? A yearly physical exam. This is also called an annual well check. Dental exams once or twice a year. Routine eye exams. Ask your health care provider how often you should have your eyes checked. Personal lifestyle choices, including: Daily care of your teeth and gums. Regular physical activity. Eating a healthy diet. Avoiding tobacco and drug use. Limiting alcohol use. Practicing safe sex. Taking low-dose aspirin every day. Taking vitamin and mineral supplements as recommended by your health care  provider. What happens during an annual well check? The services and screenings done by your health care provider during your annual well check will depend on your age, overall health, lifestyle risk factors, and family history of disease. Counseling  Your health care provider may ask you questions about your: Alcohol use. Tobacco use. Drug use. Emotional well-being. Home and relationship well-being. Sexual activity. Eating habits. History of falls. Memory and ability to understand (cognition). Work and work Statistician. Reproductive health. Screening  You may have the following tests or measurements: Height, weight, and BMI. Blood pressure. Lipid and cholesterol levels. These may be checked every 5 years, or more frequently if you are over 60 years old. Skin check. Lung cancer screening. You may have this screening every year starting at age 9 if you have a 30-pack-year history of smoking and currently smoke or have quit within the past 15 years. Fecal occult blood test (FOBT) of the stool. You may have this test every year starting at age 27. Flexible sigmoidoscopy or colonoscopy. You may have a sigmoidoscopy every 5 years or a colonoscopy every 10 years starting at age 3. Hepatitis C blood test. Hepatitis B blood test. Sexually transmitted disease (STD) testing. Diabetes screening. This is done by checking your blood sugar (glucose) after you have not eaten for a while (fasting). You may have this done every 1-3 years. Bone density scan. This is done to screen for osteoporosis. You may have this done starting at age 64. Mammogram. This  may be done every 1-2 years. Talk to your health care provider about how often you should have regular mammograms. Talk with your health care provider about your test results, treatment options, and if necessary, the need for more tests. Vaccines  Your health care provider may recommend certain vaccines, such as: Influenza vaccine. This is  recommended every year. Tetanus, diphtheria, and acellular pertussis (Tdap, Td) vaccine. You may need a Td booster every 10 years. Zoster vaccine. You may need this after age 83. Pneumococcal 13-valent conjugate (PCV13) vaccine. One dose is recommended after age 78. Pneumococcal polysaccharide (PPSV23) vaccine. One dose is recommended after age 42. Talk to your health care provider about which screenings and vaccines you need and how often you need them. This information is not intended to replace advice given to you by your health care provider. Make sure you discuss any questions you have with your health care provider. Document Released: 06/26/2015 Document Revised: 02/17/2016 Document Reviewed: 03/31/2015 Elsevier Interactive Patient Education  2017 [employer] Prevention in the Home Falls can cause injuries. They can happen to people of all ages. There are many things you can do to make your home safe and to help prevent falls. What can I do on the outside of my home? Regularly fix the edges of walkways and driveways and fix any cracks. Remove anything that might make you trip as you walk through a door, such as a raised step or threshold. Trim any bushes or trees on the path to your home. Use bright outdoor lighting. Clear any walking paths of anything that might make someone trip, such as rocks or tools. Regularly check to see if handrails are loose or broken. Make sure that both sides of any steps have handrails. Any raised decks and porches should have guardrails on the edges. Have any leaves, snow, or ice cleared regularly. Use sand or salt on walking paths during winter. Clean up any spills in your garage right away. This includes oil or grease spills. What can I do in the bathroom? Use night lights. Install grab bars by the toilet and in the tub and shower. Do not use towel bars as grab bars. Use non-skid mats or decals in the tub or shower. If you need to sit down in  the shower, use a plastic, non-slip stool. Keep the floor dry. Clean up any water that spills on the floor as soon as it happens. Remove soap buildup in the tub or shower regularly. Attach bath mats securely with double-sided non-slip rug tape. Do not have throw rugs and other things on the floor that can make you trip. What can I do in the bedroom? Use night lights. Make sure that you have a light by your bed that is easy to reach. Do not use any sheets or blankets that are too big for your bed. They should not hang down onto the floor. Have a firm chair that has side arms. You can use this for support while you get dressed. Do not have throw rugs and other things on the floor that can make you trip. What can I do in the kitchen? Clean up any spills right away. Avoid walking on wet floors. Keep items that you use a lot in easy-to-reach places. If you need to reach something above you, use a strong step stool that has a grab bar. Keep electrical cords out of the way. Do not use floor polish or wax that makes floors slippery. If you must  use wax, use non-skid floor wax. Do not have throw rugs and other things on the floor that can make you trip. What can I do with my stairs? Do not leave any items on the stairs. Make sure that there are handrails on both sides of the stairs and use them. Fix handrails that are broken or loose. Make sure that handrails are as long as the stairways. Check any carpeting to make sure that it is firmly attached to the stairs. Fix any carpet that is loose or worn. Avoid having throw rugs at the top or bottom of the stairs. If you do have throw rugs, attach them to the floor with carpet tape. Make sure that you have a light switch at the top of the stairs and the bottom of the stairs. If you do not have them, ask someone to add them for you. What else can I do to help prevent falls? Wear shoes that: Do not have high heels. Have rubber bottoms. Are comfortable  and fit you well. Are closed at the toe. Do not wear sandals. If you use a stepladder: Make sure that it is fully opened. Do not climb a closed stepladder. Make sure that both sides of the stepladder are locked into place. Ask someone to hold it for you, if possible. Clearly mark and make sure that you can see: Any grab bars or handrails. First and last steps. Where the edge of each step is. Use tools that help you move around (mobility aids) if they are needed. These include: Canes. Walkers. Scooters. Crutches. Turn on the lights when you go into a dark area. Replace any light bulbs as soon as they burn out. Set up your furniture so you have a clear path. Avoid moving your furniture around. If any of your floors are uneven, fix them. If there are any pets around you, be aware of where they are. Review your medicines with your doctor. Some medicines can make you feel dizzy. This can increase your chance of falling. Ask your doctor what other things that you can do to help prevent falls. This information is not intended to replace advice given to you by your health care provider. Make sure you discuss any questions you have with your health care provider. Document Released: 03/26/2009 Document Revised: 11/05/2015 Document Reviewed: 07/04/2014 Elsevier Interactive Patient Education  2017 [employer].

## 2022-04-29 ENCOUNTER — Other Ambulatory Visit: Payer: Self-pay

## 2022-04-29 ENCOUNTER — Telehealth: Payer: Self-pay | Admitting: Internal Medicine

## 2022-04-29 DIAGNOSIS — E7800 Pure hypercholesterolemia, unspecified: Secondary | ICD-10-CM

## 2022-04-29 DIAGNOSIS — R739 Hyperglycemia, unspecified: Secondary | ICD-10-CM

## 2022-04-29 NOTE — Telephone Encounter (Signed)
Orders placed.

## 2022-04-29 NOTE — Telephone Encounter (Signed)
Patient has a lab appt 05/04/2022, there are no orders in.

## 2022-05-03 DIAGNOSIS — I2510 Atherosclerotic heart disease of native coronary artery without angina pectoris: Secondary | ICD-10-CM | POA: Insufficient documentation

## 2022-05-03 NOTE — Progress Notes (Unsigned)
Cardiology Office Note  Date:  05/04/2022   ID:  [name], DOB [birth year], MRN [id number]  PCP:  Einar Pheasant, MD   Chief Complaint  Patient presents with   New Patient (Initial Visit)    Ref by Einar Pheasant, MD for shortness of breath and chest discomfort. Medications reviewed by the patient verbally.     HPI:  [name] is a 76 year old woman with past medical history of Hyperlipidemia GERD COVID infection Persistent diarrhea Aortic atherosclerosis PAD, carotid artery disease right: 40-59% and left:  1-39%.  Referred by Dr. Einar Pheasant for coronary artery disease  On discussion today, she reports having shortness of breath, some episodes of chest tightness She did have Covid x 2 over the past year most recently in May 2023 Some SOB since then  Breathing feels somewhat better since she has seen Dr. Nicki Reaper Reports having diarrhea episodes, was told by GI that symptoms were likely from Crestor, decreased the dose down to 10  CT scan March 09, 2022 Significant aortic atherosclerosis, coronary calcification noted, images pulled up and reviewed  Lab work reviewed Total cholesterol 240 when she was off her statin A1c 5.8  EKG personally reviewed by myself on todays visit Normal sinus rhythm rate 71 bpm nonspecific ST-T wave abnormality precordial leads  Lipid Panel     Component Value Date/Time   CHOL 241 (H) 01/26/2022 0918   CHOL 251 (H) 03/27/2012 0949   TRIG 188.0 (H) 01/26/2022 0918   HDL 49.90 01/26/2022 0918   HDL 67 03/27/2012 0949   CHOLHDL 5 01/26/2022 0918   VLDL 37.6 01/26/2022 0918   LDLCALC 154 (H) 01/26/2022 0918   LDLCALC 148 (H) 03/27/2012 0949   LDLDIRECT 123.4 02/27/2013 1000   LABVLDL 36 03/27/2012 0949     PMH:   has a past medical history of Arthritis, Environmental allergies, Esophagitis, GERD (gastroesophageal reflux disease), Hypercholesterolemia, and Hypothyroidism.  PSH:    Past Surgical History:  Procedure  Laterality Date   ANKLE SURGERY  1967   KNEE ARTHROSCOPY  12/03/07   left   KNEE ARTHROSCOPY  06/25/08   left   NASAL SINUS SURGERY  12/06   REPLACEMENT TOTAL KNEE  01/04/09   left   RHINOPLASTY  1986   VAGINAL HYSTERECTOMY  1980   ovaries left in place   WRIST SURGERY  1970   cyst removal    Current Outpatient Medications  Medication Sig Dispense Refill   acetaminophen (TYLENOL) 500 MG tablet Take 500 mg by mouth every 6 (six) hours as needed.     amoxicillin (AMOXIL) 500 MG capsule Take 1,000 mg by mouth 2 (two) times daily.     Ascorbic Acid (VITAMIN C) 500 MG CAPS Take 500 mg by mouth daily.     B Complex Vitamins (VITAMIN B-COMPLEX) TABS Take 1 tablet by mouth daily.     BUDESONIDE PO 2 (two) times daily.     busPIRone (BUSPAR) 5 MG tablet Take 5 mg by mouth daily as needed.     chlorhexidine (PERIDEX) 0.12 % solution SMARTSIG:By Mouth     Cholecalciferol (VITAMIN D PO) Take 10,000 Units by mouth daily.      Coenzyme Q10 (COQ-10 PO) Take 120 mg by mouth daily.     COLLAGEN PO Take 11 mg by mouth daily.     diclofenac sodium (VOLTAREN) 1 % GEL Apply 1 application topically as needed.     ELDERBERRY PO Take 1 capsule by mouth daily. Takes one gummy daily  fexofenadine (ALLEGRA) 180 MG tablet Take 180 mg by mouth daily.     fluocinonide cream (LIDEX) 5.02 % Apply 1 application topically 2 (two) times daily as needed.      Hypertonic Nasal Wash (SINUS RINSE NA) Place into the nose as needed.     LEVOFLOXACIN PO 2 (two) times daily. levofloxacin 142m/ml cash  ADD 1ML OF MEDICATION TO 240ML OF SALINE IN SALINE IRRIGATION BOTTLE; IRRIGATE     levothyroxine (SYNTHROID) 50 MCG tablet TAKE 1 TABLET EVERY OTHER  DAY 45 tablet 3   levothyroxine (SYNTHROID) 75 MCG tablet TAKE 1 TABLET EVERY OTHER  DAY 45 tablet 3   NON FORMULARY DE 3 Omega Benefits TID     Omega-3 Fatty Acids (FISH OIL PO) Take 1,200 mg by mouth daily.     omeprazole (PRILOSEC) 40 MG capsule TAKE 1 CAPSULE DAILY 90  capsule 1   Probiotic Product (PROBIOTIC DAILY PO) Take 1 capsule by mouth.     rosuvastatin (CRESTOR) 10 MG tablet Take 1 tablet (10 mg total) by mouth daily. 90 tablet 0   sodium chloride irrigation 0.9 % irrigation sodium chloride 0.9 % irrigation solution     UNABLE TO FIND Take 1 Capful by mouth daily. Mega B-50     vitamin E 400 UNIT capsule Take 400 Units by mouth daily.     No current facility-administered medications for this visit.     Allergies:   Paxlovid [nirmatrelvir-ritonavir], Diclofenac, Ibuprofen, Tramadol, Hydromorphone, and Oxycodone   Social History:  The patient  reports that she has quit smoking. Her smoking use included cigarettes. She has a 1.25 pack-year smoking history. She has never used smokeless tobacco. She reports that she does not drink alcohol and does not use drugs.   Family History:   family history includes Breast cancer in her maternal aunt and maternal aunt; Diabetes in her father, paternal grandfather, and paternal grandmother; Heart disease in her father; Hyperlipidemia in her paternal grandfather and paternal grandmother; Multiple myeloma in her brother; Pancreatic cancer in her cousin; Prostate cancer in her maternal uncle; Pulmonary disease in her mother; Stroke in her paternal grandmother.    Review of Systems: Review of Systems  Constitutional: Negative.   HENT: Negative.    Respiratory:  Positive for shortness of breath.   Cardiovascular:  Positive for chest pain.  Gastrointestinal: Negative.   Musculoskeletal: Negative.   Neurological: Negative.   Psychiatric/Behavioral: Negative.    All other systems reviewed and are negative.    PHYSICAL EXAM: VS:  BP 124/68 (BP Location: Right Arm, Patient Position: Sitting, Cuff Size: Normal)   Pulse 69   Ht 5' 3.5" (1.613 m)   Wt 139 lb 2 oz (63.1 kg)   SpO2 98%   BMI 24.26 kg/m  , BMI Body mass index is 24.26 kg/m. GEN: Well nourished, well developed, in no acute distress HEENT:  normal Neck: no JVD, carotid bruits, or masses Cardiac: RRR; no murmurs, rubs, or gallops,no edema  Respiratory:  clear to auscultation bilaterally, normal work of breathing GI: soft, nontender, nondistended, + BS MS: no deformity or atrophy Skin: warm and dry, no rash Neuro:  Strength and sensation are intact Psych: euthymic mood, full affect   Recent Labs: 10/18/2021: Hemoglobin 12.5; Platelets 219.0; TSH 2.30 01/26/2022: ALT 15; BUN 14; Creatinine, Ser 0.68; Potassium 3.9; Sodium 138    Lipid Panel Lab Results  Component Value Date   CHOL 241 (H) 01/26/2022   HDL 49.90 01/26/2022   LDLCALC 154 (H) 01/26/2022  TRIG 188.0 (H) 01/26/2022      Wt Readings from Last 3 Encounters:  05/04/22 139 lb 2 oz (63.1 kg)  04/18/22 139 lb (63 kg)  04/05/22 139 lb 9.6 oz (63.3 kg)       ASSESSMENT AND PLAN:  Problem List Items Addressed This Visit       Cardiology Problems   CAD (coronary artery disease) - Primary   Hypercholesteremia   Aortic atherosclerosis (HCC)   Carotid artery disease (HCC)   Angina/chest pain/shortness of breath Remote history of smoking, long history hyperlipidemia Coronary calcification and aortic atherosclerosis on CT scan We have recommended cardiac CTA for further evaluation Suggest she talk with GI to start aspirin 81 mg daily  Hyperlipidemia Awaiting Crestor 10 daily Was told by GI that Crestor 20 was causing her diarrhea Recommend she start Repatha 140 subcutaneous every 2 weeks to reach goal LDL less than 70  Carotid stenosis Consider aspirin as above Aggressive lipid management as above We will need carotid ultrasound every other year  Aortic atherosclerosis Moderate degree of disease noted in aortic arch Aspirin, aggressive lipid management as above   Total encounter time more than 60 minutes  Greater than 50% was spent in counseling and coordination of care with the patient    Signed, Esmond Plants, M.D., Ph.D. [place], [place]

## 2022-05-04 ENCOUNTER — Telehealth: Payer: Self-pay | Admitting: Cardiovascular Disease

## 2022-05-04 ENCOUNTER — Encounter: Payer: Self-pay | Admitting: Cardiovascular Disease

## 2022-05-04 ENCOUNTER — Other Ambulatory Visit (INDEPENDENT_AMBULATORY_CARE_PROVIDER_SITE_OTHER): Payer: Medicare HMO

## 2022-05-04 ENCOUNTER — Ambulatory Visit: Payer: Medicare HMO | Attending: Cardiovascular Disease | Admitting: Cardiovascular Disease

## 2022-05-04 VITALS — BP 124/68 | HR 69 | Ht 63.5 in | Wt 139.1 lb

## 2022-05-04 DIAGNOSIS — I209 Angina pectoris, unspecified: Secondary | ICD-10-CM

## 2022-05-04 DIAGNOSIS — I2510 Atherosclerotic heart disease of native coronary artery without angina pectoris: Secondary | ICD-10-CM

## 2022-05-04 DIAGNOSIS — I779 Disorder of arteries and arterioles, unspecified: Secondary | ICD-10-CM | POA: Diagnosis not present

## 2022-05-04 DIAGNOSIS — R5383 Other fatigue: Secondary | ICD-10-CM

## 2022-05-04 DIAGNOSIS — E7800 Pure hypercholesterolemia, unspecified: Secondary | ICD-10-CM | POA: Diagnosis not present

## 2022-05-04 DIAGNOSIS — I700 Atherosclerosis of aorta: Secondary | ICD-10-CM

## 2022-05-04 DIAGNOSIS — R739 Hyperglycemia, unspecified: Secondary | ICD-10-CM | POA: Diagnosis not present

## 2022-05-04 DIAGNOSIS — R072 Precordial pain: Secondary | ICD-10-CM | POA: Diagnosis not present

## 2022-05-04 LAB — LIPID PANEL
Cholesterol: 184 mg/dL (ref 0–200)
HDL: 58.3 mg/dL
LDL Cholesterol: 100 mg/dL — ABNORMAL HIGH (ref 0–99)
NonHDL: 125.54
Total CHOL/HDL Ratio: 3
Triglycerides: 126 mg/dL (ref 0.0–149.0)
VLDL: 25.2 mg/dL (ref 0.0–40.0)

## 2022-05-04 LAB — BASIC METABOLIC PANEL WITH GFR
BUN: 14 mg/dL (ref 6–23)
CO2: 30 meq/L (ref 19–32)
Calcium: 9.4 mg/dL (ref 8.4–10.5)
Chloride: 103 meq/L (ref 96–112)
Creatinine, Ser: 0.61 mg/dL (ref 0.40–1.20)
GFR: 86.4 mL/min
Glucose, Bld: 85 mg/dL (ref 70–99)
Potassium: 3.9 meq/L (ref 3.5–5.1)
Sodium: 139 meq/L (ref 135–145)

## 2022-05-04 LAB — HEPATIC FUNCTION PANEL
ALT: 15 U/L (ref 0–35)
AST: 20 U/L (ref 0–37)
Albumin: 4.5 g/dL (ref 3.5–5.2)
Alkaline Phosphatase: 57 U/L (ref 39–117)
Bilirubin, Direct: 0.1 mg/dL (ref 0.0–0.3)
Total Bilirubin: 0.5 mg/dL (ref 0.2–1.2)
Total Protein: 6.8 g/dL (ref 6.0–8.3)

## 2022-05-04 LAB — HEMOGLOBIN A1C: Hgb A1c MFr Bld: 5.5 % (ref 4.6–6.5)

## 2022-05-04 MED ORDER — METOPROLOL TARTRATE 100 MG PO TABS: 100.0000 mg | ORAL_TABLET | Freq: Once | ORAL | 0 refills | Status: DC

## 2022-05-04 MED ORDER — REPATHA SURECLICK 140 MG/ML ~~LOC~~ SOAJ: 140.0000 mg | SUBCUTANEOUS | 3 refills | Status: DC

## 2022-05-04 MED ORDER — REPATHA SURECLICK 140 MG/ML ~~LOC~~ SOAJ: 140.0000 mg | SUBCUTANEOUS | 11 refills | Status: DC

## 2022-05-04 MED ORDER — ASPIRIN 81 MG PO TBEC: 81.0000 mg | DELAYED_RELEASE_TABLET | Freq: Every day | ORAL | 3 refills | Status: DC

## 2022-05-04 NOTE — Telephone Encounter (Signed)
Pt c/o medication issue:  1. Name of Medication:   Evolocumab (REPATHA SURECLICK) 144 MG/ML SOAJ    2. How are you currently taking this medication (dosage and times per day)?   Evolocumab (REPATHA SURECLICK) 315 MG/ML SOAJ    3. Are you having a reaction (difficulty breathing--STAT)? No  4. What is your medication issue? Pt would like to know if there is a generic for medication. Please advise

## 2022-05-04 NOTE — Patient Instructions (Addendum)
Cardiac CTA for angina   Medication Instructions:   Start taking Repatha 140 mg auto injection every 14 days.  Talk to GI, see if you can start asa 81 mg daily  If you need a refill on your cardiac medications before your next appointment, please call your pharmacy.   Lab work: No new labs needed    Testing/Procedures:   Your physician has requested that you have cardiac CT. Cardiac computed tomography (CT) is a painless test that uses an x-ray machine to take clear, detailed pictures of your heart.    Your cardiac CT will be scheduled at:  [facility] [address] [place], [place] [address] (336) [facility] [place], [place] [address] [phone]  Please arrive 15 mins early for check-in and test prep.    Please follow these instructions carefully (unless otherwise directed):     Night Before the Test: Be sure to Drink plenty of water. Do not consume any caffeinated/decaffeinated beverages or chocolate 12 hours prior to your test.    On the Day of the Test: Drink plenty of water until 1 hour prior to the test. Do not eat any food 4 hours prior to the test. You may take your regular medications prior to the test.  Take metoprolol (Lopressor) 100 MG two hours prior to test. FEMALES- please wear underwire-free bra if available, avoid dresses & tight clothing        After the Test: Drink plenty of water. After receiving IV contrast, you may experience a mild flushed feeling. This is normal. On occasion, you may experience a mild rash up to 24 hours after the test. This is not dangerous. If this occurs, you can take Benadryl 25 mg and increase your fluid intake. If you experience trouble breathing, this can be serious. If it is severe call 911 IMMEDIATELY. If it is mild, please call our office. If you take any of these medications: Glipizide/Metformin,  Avandament, Glucavance, please do not take 48 hours after completing test unless otherwise instructed.  Please allow 2-4 weeks for scheduling of routine cardiac CTs. Some insurance companies require a pre-authorization which may delay scheduling of this test.   For non-scheduling related questions, please contact the cardiac imaging nurse navigator should you have any questions/concerns: Marchia Bond, Cardiac Imaging Nurse Navigator Gordy Clement, Cardiac Imaging Nurse Navigator [facility] Direct Office Dial: [phone]   For scheduling needs, including cancellations and rescheduling, please call [place], [phone].    Follow-Up: At [facility], you and your health needs are our priority.  As part of our continuing mission to provide you with exceptional heart care, we have created designated Provider Care Teams.  These Care Teams include your primary Cardiologist (physician) and Advanced Practice Providers (APPs -  Physician Assistants and Nurse Practitioners) who all work together to provide you with the care you need, when you need it.  You will need a follow up appointment in 12 months  Providers on your designated Care Team:   Murray Hodgkins, NP Christell Faith, PA-C Cadence Kathlen Mody, [place]  COVID-19 Vaccine Information can be found at: [email or web address] For questions related to vaccine distribution or appointments, please email [email or web address] or call 4804807647.

## 2022-05-09 ENCOUNTER — Telehealth: Payer: Self-pay | Admitting: Cardiovascular Disease

## 2022-05-09 NOTE — Telephone Encounter (Signed)
Praluent is insurance's preferred [id number]. Applied for PA  Key: [id number] Waiting for determination.

## 2022-05-09 NOTE — Telephone Encounter (Signed)
Returned the call to the patient. She stated that when she went to get the Repatha, she was told that she needed a prior auth completed and was also concerned about the price. She cannot afford for it to be $100 or more.

## 2022-05-09 NOTE — Telephone Encounter (Signed)
Pt c/o medication issue:  1. Name of Medication: Repatha  2. How are you currently taking this medication (dosage and times per day)?   3. Are you having a reaction (difficulty breathing--STAT)?   4. What is your medication issue? Her insurance will not pay for Repatha- Is there something else that she can take in the place of the [place]? She wanted Dr Rockey Situ to know that she is taking 81 mg coated Aspirin and 10 mg of Rosuvastatin

## 2022-05-09 NOTE — Telephone Encounter (Signed)
duplicate

## 2022-05-09 NOTE — Telephone Encounter (Signed)
Left a message for the patient to call back.  

## 2022-05-09 NOTE — Telephone Encounter (Signed)
Patient has been made aware about the Praluent.

## 2022-05-10 NOTE — Telephone Encounter (Signed)
Letter received via fax from [place] stating Praluent [employer] has been approved from 06/13/21-06/12/22.

## 2022-05-11 ENCOUNTER — Ambulatory Visit (INDEPENDENT_AMBULATORY_CARE_PROVIDER_SITE_OTHER): Payer: Medicare HMO | Admitting: Internal Medicine

## 2022-05-11 VITALS — BP 126/80 | HR 55 | Temp 97.9°F | Ht 63.5 in | Wt 137.8 lb

## 2022-05-11 DIAGNOSIS — F439 Reaction to severe stress, unspecified: Secondary | ICD-10-CM | POA: Diagnosis not present

## 2022-05-11 DIAGNOSIS — I2510 Atherosclerotic heart disease of native coronary artery without angina pectoris: Secondary | ICD-10-CM | POA: Diagnosis not present

## 2022-05-11 DIAGNOSIS — E039 Hypothyroidism, unspecified: Secondary | ICD-10-CM | POA: Diagnosis not present

## 2022-05-11 DIAGNOSIS — R739 Hyperglycemia, unspecified: Secondary | ICD-10-CM

## 2022-05-11 DIAGNOSIS — E7800 Pure hypercholesterolemia, unspecified: Secondary | ICD-10-CM

## 2022-05-11 DIAGNOSIS — K2100 Gastro-esophageal reflux disease with esophagitis, without bleeding: Secondary | ICD-10-CM | POA: Diagnosis not present

## 2022-05-11 DIAGNOSIS — Z860100 Personal history of colon polyps, unspecified: Secondary | ICD-10-CM

## 2022-05-11 DIAGNOSIS — I779 Disorder of arteries and arterioles, unspecified: Secondary | ICD-10-CM | POA: Diagnosis not present

## 2022-05-11 DIAGNOSIS — I700 Atherosclerosis of aorta: Secondary | ICD-10-CM

## 2022-05-11 DIAGNOSIS — R69 Illness, unspecified: Secondary | ICD-10-CM | POA: Diagnosis not present

## 2022-05-11 NOTE — Progress Notes (Signed)
Patient ID: [name], female   DOB: [birth year], 76 y.o.   MRN: [id number]   Subjective:    Patient ID: [name], female    DOB: 07-17-1945, 76 y.o.   MRN: [id number]   Patient here for  Chief Complaint  Patient presents with   Follow-up    3 month   .   HPI Here to follow up regarding hypercholesterolemia and GERD.  Saw cardiology 05/04/22 - recommended CTA.  Also recommended to start repatha.  Tries to stay active.  No chest pain reported.  No increased cough or congestion.  Acid controlled - omeprazole 68m.  Fiber helping bowels.  No abdominal pain reported.     Past Medical History:  Diagnosis Date   Arthritis    Environmental allergies    Esophagitis    [place], followed by Dr NVennie Homans  GERD (gastroesophageal reflux disease)    Hypercholesterolemia    Hypothyroidism    Past Surgical History:  Procedure Laterality Date   ANKLE SURGERY  1967   KNEE ARTHROSCOPY  12/03/07   left   KNEE ARTHROSCOPY  06/25/08   left   NASAL SINUS SURGERY  12/06   REPLACEMENT TOTAL KNEE  01/04/09   left   RHINOPLASTY  1986   VAGINAL HYSTERECTOMY  1980   ovaries left in place   WRIST SURGERY  1970   cyst removal   Family History  Problem Relation Age of Onset   Pulmonary disease Mother    Heart disease Father        heart attack   Diabetes Father    Multiple myeloma Brother    Breast cancer Maternal Aunt    Breast cancer Maternal Aunt    Prostate cancer Maternal Uncle    Hyperlipidemia Paternal Grandmother    Diabetes Paternal Grandmother    Stroke Paternal Grandmother    Hyperlipidemia Paternal Grandfather    Diabetes Paternal Grandfather    Pancreatic cancer Cousin    Social History   Socioeconomic History   Marital status: Married    Spouse name: Not on file   Number of children: Not on file   Years of education: Not on file   Highest education level: Not on file  Occupational History   Not on file  Tobacco Use   Smoking status: Former    Packs/day:  0.25    Years: 5.00    Total pack years: 1.25    Types: Cigarettes   Smokeless tobacco: Never   Tobacco comments:    Smoked a pack a week in her early 20's. Smoked for 5 years.  Vaping Use   Vaping Use: Never used  Substance and Sexual Activity   Alcohol use: Never    Comment: rare   Drug use: No   Sexual activity: Yes  Other Topics Concern   Not on file  Social History Narrative   Not on file   Social Determinants of Health   Financial Resource Strain: Low Risk  (04/18/2022)   Overall Financial Resource Strain (CARDIA)    Difficulty of Paying Living Expenses: Not hard at all  Food Insecurity: No Food Insecurity (04/18/2022)   Hunger Vital Sign    Worried About Running Out of Food in the Last Year: Never true    Ran Out of Food in the Last Year: Never true  Transportation Needs: No Transportation Needs (04/18/2022)   PRAPARE - THydrologist(Medical): No    Lack of Transportation (Non-Medical):  No  Physical Activity: Insufficiently Active (02/22/2021)   Exercise Vital Sign    Days of Exercise per Week: 2 days    Minutes of Exercise per Session: 60 min  Stress: No Stress Concern Present (04/18/2022)   [place]    Feeling of Stress : Not at all  Social Connections: Unknown (04/18/2022)   Social Connection and Isolation Panel [NHANES]    Frequency of Communication with Friends and Family: More than three times a week    Frequency of Social Gatherings with Friends and Family: Not on file    Attends Religious Services: Not on file    Active Member of Clubs or Organizations: Not on file    Attends Archivist Meetings: Not on file    Marital Status: Married     Review of Systems  Constitutional:  Negative for appetite change and unexpected weight change.  HENT:  Negative for congestion and sinus pressure.   Respiratory:  Negative for cough, chest tightness and shortness of  breath.   Cardiovascular:  Negative for chest pain, palpitations and leg swelling.  Gastrointestinal:  Negative for abdominal pain, diarrhea, nausea and vomiting.  Genitourinary:  Negative for difficulty urinating and dysuria.  Musculoskeletal:  Negative for joint swelling and myalgias.  Skin:  Negative for color change and rash.  Neurological:  Negative for dizziness and headaches.  Psychiatric/Behavioral:  Negative for agitation and dysphoric mood.        Objective:     BP 126/80   Pulse (!) 55   Temp 97.9 F (36.6 C) (Oral)   Ht 5' 3.5" (1.613 m)   Wt 137 lb 12.8 oz (62.5 kg)   SpO2 99%   BMI 24.03 kg/m  Wt Readings from Last 3 Encounters:  05/11/22 137 lb 12.8 oz (62.5 kg)  05/04/22 139 lb 2 oz (63.1 kg)  04/18/22 139 lb (63 kg)    Physical Exam Vitals reviewed.  Constitutional:      General: She is not in acute distress.    Appearance: Normal appearance.  HENT:     Head: Normocephalic and atraumatic.     Right Ear: External ear normal.     Left Ear: External ear normal.  Eyes:     General: No scleral icterus.       Right eye: No discharge.        Left eye: No discharge.     Conjunctiva/sclera: Conjunctivae normal.  Neck:     Thyroid: No thyromegaly.  Cardiovascular:     Rate and Rhythm: Normal rate and regular rhythm.  Pulmonary:     Effort: No respiratory distress.     Breath sounds: Normal breath sounds. No wheezing.  Abdominal:     General: Bowel sounds are normal.     Palpations: Abdomen is soft.     Tenderness: There is no abdominal tenderness.  Musculoskeletal:        General: No swelling or tenderness.     Cervical back: Neck supple. No tenderness.  Lymphadenopathy:     Cervical: No cervical adenopathy.  Skin:    Findings: No erythema or rash.  Neurological:     Mental Status: She is alert.  Psychiatric:        Mood and Affect: Mood normal.        Behavior: Behavior normal.      Outpatient Encounter Medications as of 05/11/2022   Medication Sig   acetaminophen (TYLENOL) 500 MG tablet Take 500 mg  by mouth every 6 (six) hours as needed.   Ascorbic Acid (VITAMIN C) 500 MG CAPS Take 500 mg by mouth daily.   aspirin EC 81 MG tablet Take 1 tablet (81 mg total) by mouth daily. Swallow whole.   B Complex Vitamins (VITAMIN B-COMPLEX) TABS Take 1 tablet by mouth daily.   BUDESONIDE PO 2 (two) times daily.   busPIRone (BUSPAR) 5 MG tablet Take 5 mg by mouth daily as needed.   chlorhexidine (PERIDEX) 0.12 % solution SMARTSIG:By Mouth   Cholecalciferol (VITAMIN D PO) Take 10,000 Units by mouth daily.    Coenzyme Q10 (COQ-10 PO) Take 120 mg by mouth daily.   COLLAGEN PO Take 11 mg by mouth daily.   diclofenac sodium (VOLTAREN) 1 % GEL Apply 1 application topically as needed.   ELDERBERRY PO Take 1 capsule by mouth daily. Takes one gummy daily   fluocinonide cream (LIDEX) 3.90 % Apply 1 application topically 2 (two) times daily as needed.    Hypertonic Nasal Wash (SINUS RINSE NA) Place into the nose as needed.   LEVOFLOXACIN PO 2 (two) times daily. levofloxacin 130m/ml cash  ADD 1ML OF MEDICATION TO 240ML OF SALINE IN SALINE IRRIGATION BOTTLE; IRRIGATE   levothyroxine (SYNTHROID) 50 MCG tablet TAKE 1 TABLET EVERY OTHER  DAY   levothyroxine (SYNTHROID) 75 MCG tablet TAKE 1 TABLET EVERY OTHER  DAY   NON FORMULARY DE 3 Omega Benefits TID   Omega-3 Fatty Acids (FISH OIL PO) Take 1,200 mg by mouth daily.   omeprazole (PRILOSEC) 40 MG capsule TAKE 1 CAPSULE DAILY   Probiotic Product (PROBIOTIC DAILY PO) Take 1 capsule by mouth.   rosuvastatin (CRESTOR) 10 MG tablet Take 1 tablet (10 mg total) by mouth daily.   sodium chloride irrigation 0.9 % irrigation sodium chloride 0.9 % irrigation solution   UNABLE TO FIND Take 1 Capful by mouth daily. Mega B-50   vitamin E 400 UNIT capsule Take 400 Units by mouth daily.   Evolocumab (REPATHA SURECLICK) 1300MG/ML SOAJ Inject 140 mg into the skin every 14 (fourteen) days. (Patient not taking:  Reported on 05/11/2022)   metoprolol tartrate (LOPRESSOR) 100 MG tablet Take 1 tablet (100 mg total) by mouth once for 1 dose. Take 2 hours prior to your CT scan.   [DISCONTINUED] amoxicillin (AMOXIL) 500 MG capsule Take 1,000 mg by mouth 2 (two) times daily. (Patient not taking: Reported on 05/11/2022)   [DISCONTINUED] fexofenadine (ALLEGRA) 180 MG tablet Take 180 mg by mouth daily. (Patient not taking: Reported on 05/11/2022)   No facility-administered encounter medications on file as of 05/11/2022.     Lab Results  Component Value Date   WBC 6.0 10/18/2021   HGB 12.5 10/18/2021   HCT 36.6 10/18/2021   PLT 219.0 10/18/2021   GLUCOSE 85 05/04/2022   CHOL 184 05/04/2022   TRIG 126.0 05/04/2022   HDL 58.30 05/04/2022   LDLDIRECT 123.4 02/27/2013   LDLCALC 100 (H) 05/04/2022   ALT 15 05/04/2022   AST 20 05/04/2022   NA 139 05/04/2022   K 3.9 05/04/2022   CL 103 05/04/2022   CREATININE 0.61 05/04/2022   BUN 14 05/04/2022   CO2 30 05/04/2022   TSH 2.30 10/18/2021   HGBA1C 5.5 05/04/2022    MM 3D SCREEN BREAST BILATERAL  Result Date: 04/14/2022 CLINICAL DATA:  Screening. EXAM: DIGITAL SCREENING BILATERAL MAMMOGRAM WITH TOMOSYNTHESIS AND CAD TECHNIQUE: Bilateral screening digital craniocaudal and mediolateral oblique mammograms were obtained. Bilateral screening digital breast tomosynthesis was performed. The images  were evaluated with computer-aided detection. COMPARISON:  Previous exam(s). ACR Breast Density Category b: There are scattered areas of fibroglandular density. FINDINGS: There are no findings suspicious for malignancy. IMPRESSION: No mammographic evidence of malignancy. A result letter of this screening mammogram will be mailed directly to the patient. RECOMMENDATION: Screening mammogram in one year. (Code:SM-B-01Y) BI-RADS CATEGORY  1: Negative. Electronically Signed   By: Kristopher Oppenheim M.D.   On: 04/14/2022 11:28       Assessment & Plan:   Problem List Items  Addressed This Visit     Aortic atherosclerosis ([place])    Continue crestor.       CAD (coronary artery disease)    Saw Dr Rockey Situ 05/04/22 - recommended CTA.       Carotid artery disease (HCC)    Carotid ultrasound - right: 40-59% and left:  1-39%.  Continue statin therapy.  Carotid ultrasound (04/2021) - recommended f/u carotid ultrasound every other year.       GERD (gastroesophageal reflux disease)    EGD:  11/21/19 - moderate diffuse gastritis - pathology - reactive gastropathy.  On PPI.        History of colonic polyps    Colonoscopy 01/2018.  Per pt, f/u colonoscopy recommended in 5 years.       Hypercholesteremia - Primary    Discussed today.  She felt crestor was contributing to her diarrhea.  Back on crestor now - 29m q day.  Follow lipid panel and liver function tests.  Saw cardiology 05/04/22 - recommended starting repatha. Follow lipid panel and liver function tests.       Relevant Orders   Basic metabolic panel   Hepatic function panel   Lipid panel   Hyperglycemia    Low carb diet and exercise.  Follow met b and a1c.       Relevant Orders   Hemoglobin A1c   Hypothyroidism    On thyroid replacement.  Follow tsh.       Stress    Overall appears to be handling things relatively well.  Has buspar to take prn.  Follow.         CEinar Pheasant MD

## 2022-05-13 ENCOUNTER — Encounter: Payer: Self-pay | Admitting: Internal Medicine

## 2022-05-13 DIAGNOSIS — M19072 Primary osteoarthritis, left ankle and foot: Secondary | ICD-10-CM | POA: Diagnosis not present

## 2022-05-13 DIAGNOSIS — M19079 Primary osteoarthritis, unspecified ankle and foot: Secondary | ICD-10-CM | POA: Insufficient documentation

## 2022-05-13 DIAGNOSIS — M19071 Primary osteoarthritis, right ankle and foot: Secondary | ICD-10-CM | POA: Diagnosis not present

## 2022-05-16 ENCOUNTER — Encounter: Payer: Self-pay | Admitting: Internal Medicine

## 2022-05-16 MED ORDER — PRALUENT 75 MG/ML ~~LOC~~ SOAJ: 1.0000 | SUBCUTANEOUS | 11 refills | Status: DC

## 2022-05-16 NOTE — Assessment & Plan Note (Signed)
Low carb diet and exercise.  Follow met b and a1c.  

## 2022-05-16 NOTE — Assessment & Plan Note (Signed)
Discussed today.  She felt crestor was contributing to her diarrhea.  Back on crestor now - '10mg'$  q day.  Follow lipid panel and liver function tests.  Saw cardiology 05/04/22 - recommended starting repatha. Follow lipid panel and liver function tests.

## 2022-05-16 NOTE — Assessment & Plan Note (Signed)
Colonoscopy 01/2018.  Per pt, f/u colonoscopy recommended in 5 years.  

## 2022-05-16 NOTE — Assessment & Plan Note (Signed)
On thyroid replacement.  Follow tsh.  

## 2022-05-16 NOTE — Telephone Encounter (Signed)
Rx had not been sent in. Repatha has been removed from her med list, Praluent rx sent to requested pharmacy.

## 2022-05-16 NOTE — Assessment & Plan Note (Signed)
Carotid ultrasound - right: 40-59% and left:  1-39%.  Continue statin therapy.  Carotid ultrasound (04/2021) - recommended f/u carotid ultrasound every other year.  

## 2022-05-16 NOTE — Assessment & Plan Note (Signed)
Continue crestor 

## 2022-05-16 NOTE — Assessment & Plan Note (Signed)
EGD:  11/21/19 - moderate diffuse gastritis - pathology - reactive gastropathy.  On PPI.

## 2022-05-16 NOTE — Assessment & Plan Note (Signed)
Overall appears to be handling things relatively well.  Has buspar to take prn.  Follow.

## 2022-05-16 NOTE — Assessment & Plan Note (Signed)
Saw Dr Rockey Situ 05/04/22 - recommended CTA.

## 2022-05-16 NOTE — Telephone Encounter (Signed)
Patient calling back. She requests the praluent be sent to [facility] [address].

## 2022-05-21 DIAGNOSIS — J101 Influenza due to other identified influenza virus with other respiratory manifestations: Secondary | ICD-10-CM | POA: Diagnosis not present

## 2022-05-24 ENCOUNTER — Encounter: Payer: Self-pay | Admitting: Internal Medicine

## 2022-05-24 NOTE — Telephone Encounter (Signed)
S/w pt - Diarrhea has resolved. Dry cough, with chest congestion. Cannot cough anything up. Hoarse voice, slight sore throat. Concerned she cannot get anything up and out.  No fever, chest pain, s.o.b  Scheduled w/ Volanda Napoleon tomorrow

## 2022-05-25 ENCOUNTER — Encounter: Payer: Self-pay | Admitting: Family Medicine

## 2022-05-25 ENCOUNTER — Ambulatory Visit (INDEPENDENT_AMBULATORY_CARE_PROVIDER_SITE_OTHER): Payer: Medicare HMO | Admitting: Family Medicine

## 2022-05-25 ENCOUNTER — Ambulatory Visit: Payer: Medicare HMO | Admitting: Family Medicine

## 2022-05-25 VITALS — BP 120/60 | HR 67 | Temp 99.1°F | Ht 63.5 in | Wt 134.0 lb

## 2022-05-25 DIAGNOSIS — J101 Influenza due to other identified influenza virus with other respiratory manifestations: Secondary | ICD-10-CM

## 2022-05-25 DIAGNOSIS — J849 Interstitial pulmonary disease, unspecified: Secondary | ICD-10-CM | POA: Diagnosis not present

## 2022-05-25 MED ORDER — GUAIFENESIN-CODEINE 100-10 MG/5ML PO SYRP: 5.0000 mL | ORAL_SOLUTION | Freq: Three times a day (TID) | ORAL | 0 refills | Status: DC | PRN

## 2022-05-25 MED ORDER — PREDNISONE 20 MG PO TABS: ORAL_TABLET | ORAL | 0 refills | Status: DC

## 2022-05-25 NOTE — Progress Notes (Unsigned)
    Kutler Vanvranken T. Davi Rotan, MD, [place] at [facility] [place], [address]  Phone: [phone]  FAX: [phone]  [name] - 76 y.o. female  MRN [id number]  Date of Birth: 1945/07/17  Date: 05/25/2022  PCP: Einar Pheasant, MD  Referral: Einar Pheasant, MD  Chief Complaint  Patient presents with   Cough    Dry Went on Bus Trip last Wednesday Symptoms started that evening Went to [place] on Saturday + for Influenza A/Neg for Covid and RSV Wants her lungs listened to   Hoarse   Subjective:   [name] is a 76 y.o. very pleasant female patient with Body mass index is 24.03 kg/m. who presents with the following:  Influenza, has a bad cough.  When she lays down, she will have some severe cough.  UC gave some tessalon perles.   ILD on high resolution CT.   Has been taking some miucinex, nasal spray, and tylenol.   Today, she feels ok.   Coarse BS, wheezing  Review of Systems is noted in the HPI, as appropriate  Objective:   Ht 5' 3.5" (1.613 m)   BMI 24.03 kg/m   GEN: No acute distress; alert,appropriate. PULM: Breathing comfortably in no respiratory distress PSYCH: Normally interactive.   Laboratory and Imaging Data:  Assessment and Plan:   ***

## 2022-06-01 ENCOUNTER — Telehealth: Payer: Self-pay | Admitting: Internal Medicine

## 2022-06-01 NOTE — Telephone Encounter (Signed)
LM for pt to cb 

## 2022-06-01 NOTE — Telephone Encounter (Signed)
S/w pt - still with cough and congestion. Was previously on Tessalon and Prednisone '40mg'$  on 12/14 - finished.  Saw Dr Edilia Bo yesterday, was given another round of prednisone, but '20mg'$  this time and codeine cough syrup. Will finish 12/23  Pt is concerned that this has been going on for a while.  Pt reports Thick yellow mucus, Deep cough. Fatigue.  No fever, No shortness of breathe, No chills   Also wanted to let you know Bps have been running in 120's/70's .  Pt wants to know if she should be seen or wait till finishes prednisone this round

## 2022-06-01 NOTE — Telephone Encounter (Signed)
Per review of note, states was reevaluated yesterday.   If doing better and just reevaluated yesterday can see if new medication helps symptoms resolve.  I do not see in chart where she was seen yesterday.   Certainly if any worsening symptoms or problems or concerns, needs to be reevaluated.

## 2022-06-01 NOTE — Telephone Encounter (Signed)
Pt advised Will reach out when finishes pred / if symptoms get worse with update

## 2022-06-01 NOTE — Telephone Encounter (Addendum)
Patient still having a cough and congestion . She will finish prednisone on 06/04/22. Patient needing an appointment. We do not have any appointments. This has been on going from a flu diagnosis.

## 2022-06-02 ENCOUNTER — Ambulatory Visit: Payer: Medicare HMO

## 2022-06-06 ENCOUNTER — Encounter: Payer: Self-pay | Admitting: Otolaryngology

## 2022-06-14 DIAGNOSIS — Z0100 Encounter for examination of eyes and vision without abnormal findings: Secondary | ICD-10-CM | POA: Diagnosis not present

## 2022-06-17 DIAGNOSIS — Q828 Other specified congenital malformations of skin: Secondary | ICD-10-CM | POA: Diagnosis not present

## 2022-06-17 DIAGNOSIS — L570 Actinic keratosis: Secondary | ICD-10-CM | POA: Diagnosis not present

## 2022-06-17 DIAGNOSIS — L820 Inflamed seborrheic keratosis: Secondary | ICD-10-CM | POA: Diagnosis not present

## 2022-06-17 DIAGNOSIS — D485 Neoplasm of uncertain behavior of skin: Secondary | ICD-10-CM | POA: Diagnosis not present

## 2022-06-21 ENCOUNTER — Telehealth: Payer: Self-pay | Admitting: Cardiovascular Disease

## 2022-06-21 ENCOUNTER — Telehealth (HOSPITAL_COMMUNITY): Payer: Self-pay | Admitting: *Deleted

## 2022-06-21 MED ORDER — REPATHA SURECLICK 140 MG/ML ~~LOC~~ SOAJ: 1.0000 | SUBCUTANEOUS | 11 refills | Status: DC

## 2022-06-21 NOTE — Telephone Encounter (Signed)
Patient was transferred from the scheduler and had questions regarding her upcoming cardiac imaging study; pt verbalizes understanding of appt date/time, parking situation and where to check in, pre-test NPO status and medications ordered, and verified current allergies; name and call back number provided for further questions should they arise  Gordy Clement RN Navigator Cardiac Imaging [facility] [phone] office [phone] cell  Patient to take '100mg'$  metoprolol tartrate two hours prior to her cardiac CT scan.

## 2022-06-21 NOTE — Telephone Encounter (Signed)
[employer] changed formulary from [place] to [place] this year. Prior auth submitted, key [id number], approved through 06/13/23. Rx sent to pharmacy, pt aware and appreciative for the assistance.

## 2022-06-21 NOTE — Telephone Encounter (Signed)
Pt c/o medication issue:  1. Name of Medication:  Alirocumab ([place]) 75 MG/ML SOAJ  2. How are you currently taking this medication (dosage and times per day)?   3. Are you having a reaction (difficulty breathing--STAT)?   4. What is your medication issue?   Patient states she spoke with [facility] and they informed her that this year Repatha is tier 3, but Praluent is not. Patient is requesting to speak with someone who is able to assist with a medication PA.

## 2022-06-23 ENCOUNTER — Telehealth: Payer: Self-pay | Admitting: Cardiovascular Disease

## 2022-06-23 ENCOUNTER — Ambulatory Visit
Admission: RE | Admit: 2022-06-23 | Discharge: 2022-06-23 | Disposition: A | Discharge status: Home / Self Care | Payer: Medicare HMO | Source: Ambulatory Visit | Attending: Cardiovascular Disease | Admitting: Cardiovascular Disease

## 2022-06-23 DIAGNOSIS — R072 Precordial pain: Secondary | ICD-10-CM | POA: Insufficient documentation

## 2022-06-23 MED ORDER — IOHEXOL 350 MG/ML SOLN
75.0000 mL | Freq: Once | INTRAVENOUS | Status: AC | PRN
  Administered 2022-06-23: 75 mL via INTRAVENOUS

## 2022-06-23 MED ORDER — NITROGLYCERIN 0.4 MG SL SUBL
0.8000 mg | SUBLINGUAL_TABLET | Freq: Once | SUBLINGUAL | Status: AC
  Administered 2022-06-23: 0.8 mg via SUBLINGUAL

## 2022-06-23 NOTE — Telephone Encounter (Signed)
I spoke with the patient. She called to inquire:  1) if her Cardiac CT from today had been reviewed by Dr. Rockey Situ Patient advised the report is still pending Dr. Rockey Situ to review/ sign off  2) If she should initiate Repatha. The was ordered in November per Dr. Rockey Situ Patient's insurance would only approve Praluent, but she did not pick this up to start because she became very sick with the flu and symptoms have just resolved. Her insurance is now approving Repatha  I have advised the patient that the initiation of Repatha was due to her elevated LDL from her labs in November, so she should start Repatha injections as prescribed. She is aware we will call with her Cardiac CT results when received from Dr. Rockey Situ.   She is due for repeat labs with Dr. Nicki Reaper on 08/10/22.

## 2022-06-23 NOTE — Progress Notes (Signed)
Patient tolerated procedure well. Ambulate w/o difficulty. Denies any lightheadedness or being dizzy. Pt denies any pain at this time. Sitting in chair, drinking water provided. P is encouraged to drink additional water throughout the day and reason explained to patient. Patient verbalized understanding and all questions answered. ABC intact. No further needs at this time. Discharge from procedure area w/o issues.  °

## 2022-06-23 NOTE — Telephone Encounter (Signed)
Patient is requesting to speak with Dr. Rockey Situ or nurse in regards to the medication Repatha.

## 2022-06-24 ENCOUNTER — Telehealth: Payer: Self-pay | Admitting: Cardiovascular Disease

## 2022-06-24 NOTE — Telephone Encounter (Signed)
Nurse informed pt once MD reviews preliminary a nurse will call back with results. Pt verbalized understanding.

## 2022-06-24 NOTE — Telephone Encounter (Signed)
Patient is requesting a call back to discuss CT results. 

## 2022-06-25 ENCOUNTER — Other Ambulatory Visit: Payer: Self-pay | Admitting: Internal Medicine

## 2022-06-27 ENCOUNTER — Telehealth: Payer: Self-pay | Admitting: Cardiovascular Disease

## 2022-06-27 NOTE — Telephone Encounter (Signed)
Returned the call to the patient. She stated that she took her first dose of the Repatha this Saturday. She stated that she started having flu like symptoms with headache, tired, chills and diarrhea that night and through the weekend. She is starting to feel better today. She feels like it is from the [place] and does not want to take it anymore.   Side note, patient has been made aware of cardiac ct results:    Minna Merritts, MD 06/25/2022  4:13 PM EST     Cardiac CTA Calcium score 0, no significant coronary disease There is some atherosclerosis in the aorta, seen previously No further testing needed, overall looks good

## 2022-06-27 NOTE — Telephone Encounter (Signed)
Pt c/o medication issue:  1. Name of Medication: Evolocumab (REPATHA SURECLICK) 389 MG/ML SOAJ   2. How are you currently taking this medication (dosage and times per day)?   3. Are you having a reaction (difficulty breathing--STAT)?   4. What is your medication issue? Pt states she has flu like symptoms like chills, headache, backache, fatigue, and nausea. She states she has also had some diarrhea. She took on Saturday

## 2022-06-29 NOTE — Telephone Encounter (Signed)
Katrina Merritts, MD  Sent: Wed June 29, 2022  2:38 PM  To: P [facility] Triage         Message  Would restart Crestor 10 with Zetia 10  TG

## 2022-06-29 NOTE — Telephone Encounter (Signed)
I spoke with the patient regarding Dr. Donivan Scull recommendations to: 1) Restart Crestor 10 mg once daily 2) START Zetia 10 mg   Per the patient, she had been off Crestor 10 mg once daily due to some recent illnesses and GI issues. She has just restarted the Crestor 10 mg once daily ~ 06/15/22. From a GI perspective, she seems to be tolerating this ok.  She does voice concerns that she may have previously been on Zetia "years ago" per Dr. Nicki Reaper and could not remember why this may have been stopped.  I advised the patient I could not see this listed in Epic as a part of her medication history, but this may have been in a previous EMR system.  She feels like her stomach has just settled down. She has various dental procedures coming up that will require some antibiotics prior. She has recently restarted her probiotic.   The patient does have concerns about trying the Zetia as she is finally feeling better from recent illnesses.  She is due for repeat fasting lab work for Dr. Nicki Reaper around 08/10/22 and then to see Dr. Nicki Reaper after that.  I have advised the patient: 1) to continue with Crestor 10 mg once daily & see how she tolerates this from a GI perspective 2) repeat her lab work with Dr. Nicki Reaper at the end of February as planned 3) I will notify Dr. Nicki Reaper that Dr. Rockey Situ has recommended Zetia 10 mg once daily in addition to her Crestor 10 mg once daily, but the patient would prefer to discuss this with her after her next lab draw  The patient voices understanding of the above and is agreeable.

## 2022-06-29 NOTE — Telephone Encounter (Signed)
Pt made aware of MD's recommendations and stated when she previously was on Crestor 20 mg and developed GI symptoms. Pt stated she was only able to tolerate Crestor 10 mg.     Will forward to MD to make aware  Minna Merritts, MD  [facility] [phone] hours ago (5:31 PM)    Without Repatha, Would reconsider taking Crestor 20 as she was taking previously To get numbers down to goal would need to add Zetia 10 mg daily with Crestor Thx TGollan

## 2022-07-19 DIAGNOSIS — R69 Illness, unspecified: Secondary | ICD-10-CM | POA: Diagnosis not present

## 2022-08-10 ENCOUNTER — Other Ambulatory Visit (INDEPENDENT_AMBULATORY_CARE_PROVIDER_SITE_OTHER): Payer: Medicare HMO

## 2022-08-10 DIAGNOSIS — R739 Hyperglycemia, unspecified: Secondary | ICD-10-CM | POA: Diagnosis not present

## 2022-08-10 DIAGNOSIS — E7800 Pure hypercholesterolemia, unspecified: Secondary | ICD-10-CM | POA: Diagnosis not present

## 2022-08-10 LAB — LIPID PANEL
Cholesterol: 183 mg/dL (ref 0–200)
HDL: 60 mg/dL
LDL Cholesterol: 93 mg/dL (ref 0–99)
NonHDL: 122.5
Total CHOL/HDL Ratio: 3
Triglycerides: 149 mg/dL (ref 0.0–149.0)
VLDL: 29.8 mg/dL (ref 0.0–40.0)

## 2022-08-10 LAB — HEPATIC FUNCTION PANEL
ALT: 18 U/L (ref 0–35)
AST: 21 U/L (ref 0–37)
Albumin: 4.2 g/dL (ref 3.5–5.2)
Alkaline Phosphatase: 65 U/L (ref 39–117)
Bilirubin, Direct: 0.1 mg/dL (ref 0.0–0.3)
Total Bilirubin: 0.5 mg/dL (ref 0.2–1.2)
Total Protein: 6.9 g/dL (ref 6.0–8.3)

## 2022-08-10 LAB — BASIC METABOLIC PANEL WITH GFR
BUN: 15 mg/dL (ref 6–23)
CO2: 31 meq/L (ref 19–32)
Calcium: 10.1 mg/dL (ref 8.4–10.5)
Chloride: 104 meq/L (ref 96–112)
Creatinine, Ser: 0.67 mg/dL (ref 0.40–1.20)
GFR: 84.31 mL/min
Glucose, Bld: 83 mg/dL (ref 70–99)
Potassium: 4.3 meq/L (ref 3.5–5.1)
Sodium: 141 meq/L (ref 135–145)

## 2022-08-10 LAB — HEMOGLOBIN A1C: Hgb A1c MFr Bld: 5.4 % (ref 4.6–6.5)

## 2022-08-12 ENCOUNTER — Ambulatory Visit (INDEPENDENT_AMBULATORY_CARE_PROVIDER_SITE_OTHER): Payer: Medicare HMO | Admitting: Internal Medicine

## 2022-08-12 ENCOUNTER — Encounter: Payer: Self-pay | Admitting: Internal Medicine

## 2022-08-12 VITALS — BP 132/78 | HR 79 | Temp 98.0°F | Resp 16 | Ht 63.5 in | Wt 136.0 lb

## 2022-08-12 DIAGNOSIS — E039 Hypothyroidism, unspecified: Secondary | ICD-10-CM | POA: Diagnosis not present

## 2022-08-12 DIAGNOSIS — K2100 Gastro-esophageal reflux disease with esophagitis, without bleeding: Secondary | ICD-10-CM

## 2022-08-12 DIAGNOSIS — E7800 Pure hypercholesterolemia, unspecified: Secondary | ICD-10-CM | POA: Diagnosis not present

## 2022-08-12 DIAGNOSIS — R739 Hyperglycemia, unspecified: Secondary | ICD-10-CM | POA: Diagnosis not present

## 2022-08-12 DIAGNOSIS — Z860100 Personal history of colon polyps, unspecified: Secondary | ICD-10-CM

## 2022-08-12 DIAGNOSIS — Z0000 Encounter for general adult medical examination without abnormal findings: Secondary | ICD-10-CM

## 2022-08-12 DIAGNOSIS — I2510 Atherosclerotic heart disease of native coronary artery without angina pectoris: Secondary | ICD-10-CM

## 2022-08-12 DIAGNOSIS — I700 Atherosclerosis of aorta: Secondary | ICD-10-CM | POA: Diagnosis not present

## 2022-08-12 DIAGNOSIS — Z1211 Encounter for screening for malignant neoplasm of colon: Secondary | ICD-10-CM | POA: Diagnosis not present

## 2022-08-12 DIAGNOSIS — I779 Disorder of arteries and arterioles, unspecified: Secondary | ICD-10-CM | POA: Diagnosis not present

## 2022-08-12 DIAGNOSIS — Z1231 Encounter for screening mammogram for malignant neoplasm of breast: Secondary | ICD-10-CM | POA: Diagnosis not present

## 2022-08-12 LAB — TSH: TSH: 1.42 u[IU]/mL (ref 0.35–5.50)

## 2022-08-12 MED ORDER — ROSUVASTATIN CALCIUM 10 MG PO TABS: 10.0000 mg | ORAL_TABLET | Freq: Every day | ORAL | 1 refills | Status: DC

## 2022-08-12 NOTE — Assessment & Plan Note (Signed)
Physical today 08/12/22.  Colonoscopy 01/2018.  Recommended f/u in 5 years.  Mammogram 04/12/22- Briads I.  Bone density 03/2021 - osteopenia.  Continue calcium, vitamin D and weight bearing exercise.

## 2022-08-12 NOTE — Progress Notes (Unsigned)
Subjective:    Patient ID: [name], female    DOB: 26-Dec-1945, 77 y.o.   MRN: [id number]  Patient here for  Chief Complaint  Patient presents with   Annual Exam    HPI Here for physical exam.    Past Medical History:  Diagnosis Date   Arthritis    Environmental allergies    Esophagitis    Bar[place], followed by Dr Vennie Homans   GERD (gastroesophageal reflux disease)    Hypercholesterolemia    Hypothyroidism    Past Surgical History:  Procedure Laterality Date   ANKLE SURGERY  1967   KNEE ARTHROSCOPY  12/03/07   left   KNEE ARTHROSCOPY  06/25/08   left   NASAL SINUS SURGERY  12/06   REPLACEMENT TOTAL KNEE  01/04/09   left   RHINOPLASTY  1986   VAGINAL HYSTERECTOMY  1980   ovaries left in place   WRIST SURGERY  1970   cyst removal   Family History  Problem Relation Age of Onset   Pulmonary disease Mother    Heart disease Father        heart attack   Diabetes Father    Multiple myeloma Brother    Breast cancer Maternal Aunt    Breast cancer Maternal Aunt    Prostate cancer Maternal Uncle    Hyperlipidemia Paternal Grandmother    Diabetes Paternal Grandmother    Stroke Paternal Grandmother    Hyperlipidemia Paternal Grandfather    Diabetes Paternal Grandfather    Pancreatic cancer Cousin    Social History   Socioeconomic History   Marital status: Married    Spouse name: Not on file   Number of children: Not on file   Years of education: Not on file   Highest education level: Not on file  Occupational History   Not on file  Tobacco Use   Smoking status: Former    Packs/day: 0.25    Years: 5.00    Total pack years: 1.25    Types: Cigarettes   Smokeless tobacco: Never   Tobacco comments:    Smoked a pack a week in her early 20's. Smoked for 5 years.  Vaping Use   Vaping Use: Never used  Substance and Sexual Activity   Alcohol use: Never    Comment: rare   Drug use: No   Sexual activity: Yes  Other Topics Concern   Not on file  Social  History Narrative   Not on file   Social Determinants of Health   Financial Resource Strain: Low Risk  (04/18/2022)   Overall Financial Resource Strain (CARDIA)    Difficulty of Paying Living Expenses: Not hard at all  Food Insecurity: No Food Insecurity (04/18/2022)   Hunger Vital Sign    Worried About Running Out of Food in the Last Year: Never true    Ran Out of Food in the Last Year: Never true  Transportation Needs: No Transportation Needs (04/18/2022)   PRAPARE - Hydrologist (Medical): No    Lack of Transportation (Non-Medical): No  Physical Activity: Insufficiently Active (02/22/2021)   Exercise Vital Sign    Days of Exercise per Week: 2 days    Minutes of Exercise per Session: 60 min  Stress: No Stress Concern Present (04/18/2022)   [place]    Feeling of Stress : Not at all  Social Connections: Unknown (04/18/2022)   Social Connection and Isolation Panel [NHANES]  Frequency of Communication with Friends and Family: More than three times a week    Frequency of Social Gatherings with Friends and Family: Not on file    Attends Religious Services: Not on file    Active Member of Clubs or Organizations: Not on file    Attends Archivist Meetings: Not on file    Marital Status: Married     Review of Systems     Objective:     BP 132/78   Pulse 79   Temp 98 F (36.7 C)   Resp 16   Ht 5' 3.5" (1.613 m)   Wt 136 lb (61.7 kg)   SpO2 99%   BMI 23.71 kg/m  Wt Readings from Last 3 Encounters:  08/12/22 136 lb (61.7 kg)  05/25/22 134 lb (60.8 kg)  05/11/22 137 lb 12.8 oz (62.5 kg)    Physical Exam   Outpatient Encounter Medications as of 08/12/2022  Medication Sig   acetaminophen (TYLENOL) 500 MG tablet Take 500 mg by mouth every 6 (six) hours as needed.   Ascorbic Acid (VITAMIN C) 500 MG CAPS Take 500 mg by mouth daily.   aspirin EC 81 MG tablet Take 1 tablet  (81 mg total) by mouth daily. Swallow whole.   B Complex Vitamins (VITAMIN B-COMPLEX) TABS Take 1 tablet by mouth daily.   BUDESONIDE PO 2 (two) times daily.   busPIRone (BUSPAR) 5 MG tablet Take 5 mg by mouth daily as needed.   chlorhexidine (PERIDEX) 0.12 % solution SMARTSIG:By Mouth   Cholecalciferol (VITAMIN D PO) Take 10,000 Units by mouth daily.    Coenzyme Q10 (COQ-10 PO) Take 120 mg by mouth daily.   COLLAGEN PO Take 11 mg by mouth daily.   diclofenac sodium (VOLTAREN) 1 % GEL Apply 1 application topically as needed.   ELDERBERRY PO Take 1 capsule by mouth daily. Takes one gummy daily   fluocinonide cream (LIDEX) [id number] % Apply 1 application topically 2 (two) times daily as needed.    Hypertonic Nasal Wash (SINUS RINSE NA) Place into the nose as needed.   levothyroxine (SYNTHROID) 50 MCG tablet TAKE 1 TABLET EVERY OTHER  DAY   levothyroxine (SYNTHROID) 75 MCG tablet TAKE 1 TABLET EVERY OTHER  DAY   NON FORMULARY DE 3 Omega Benefits TID   Omega-3 Fatty Acids (FISH OIL PO) Take 1,200 mg by mouth daily.   omeprazole (PRILOSEC) 40 MG capsule TAKE 1 CAPSULE DAILY   Probiotic Product (PROBIOTIC DAILY PO) Take 1 capsule by mouth.   rosuvastatin (CRESTOR) 10 MG tablet Take 1 tablet (10 mg total) by mouth daily.   sodium chloride irrigation 0.9 % irrigation sodium chloride 0.9 % irrigation solution   UNABLE TO FIND Take 1 Capful by mouth daily. Mega B-50   vitamin E 400 UNIT capsule Take 400 Units by mouth daily.   [DISCONTINUED] Evolocumab (REPATHA SURECLICK) [id number] MG/ML SOAJ Inject 140 mg into the skin every 14 (fourteen) days.   [DISCONTINUED] guaiFENesin-codeine (ROBITUSSIN AC) 100-10 MG/5ML syrup Take 5 mLs by mouth 3 (three) times daily as needed for cough.   [DISCONTINUED] metoprolol tartrate (LOPRESSOR) 100 MG tablet Take 1 tablet (100 mg total) by mouth once for 1 dose. Take 2 hours prior to your CT scan.   [DISCONTINUED] predniSONE (DELTASONE) 20 MG tablet 2 tabs po daily for 5 days,  then 1 tab po daily for 5 days   [DISCONTINUED] rosuvastatin (CRESTOR) 10 MG tablet Take 1 tablet (10 mg total) by mouth daily.  No facility-administered encounter medications on file as of 08/12/2022.     Lab Results  Component Value Date   WBC 6.0 10/18/2021   HGB 12.5 10/18/2021   HCT 36.6 10/18/2021   PLT 219.0 10/18/2021   GLUCOSE 83 08/10/2022   CHOL 183 08/10/2022   TRIG 149.0 08/10/2022   HDL 60.00 08/10/2022   LDLDIRECT 123.4 02/27/2013   LDLCALC 93 08/10/2022   ALT 18 08/10/2022   AST 21 08/10/2022   NA 141 08/10/2022   K 4.3 08/10/2022   CL 104 08/10/2022   CREATININE 0.67 08/10/2022   BUN 15 08/10/2022   CO2 31 08/10/2022   TSH 2.30 10/18/2021   HGBA1C 5.4 08/10/2022    CT CORONARY MORPH W/CTA COR W/SCORE W/CA W/CM &/OR WO/CM  Addendum Date: 06/23/2022   ADDENDUM REPORT: 06/23/2022 16:19 EXAM: OVER-READ INTERPRETATION CARDIAC CT CHEST The following report is an over-read performed by radiologist Dr. Van Clines of [facility] on 06/23/2022. This over-read does not include interpretation of cardiac or coronary anatomy or pathology. The coronary CTA interpretation by the cardiologist is attached. COMPARISON:  CT chest 03/09/2022 FINDINGS: Extracardiac Vascular: Descending thoracic aortic atherosclerotic vascular calcification. Mediastinum: Unremarkable Lung: Subpleural nodule anteriorly in the right upper lobe right middle lobe (horizontal fissure difficult to visualize) measures 8 by 5 by 8 mm (volume = 200 mm^3) on image 6 of series 12, previously the same. There is new atelectasis in both lower lobes with bandlike confluent opacities. Dependent ground-glass opacities in both lower lobes noted, potentially from atelectasis or alveolitis. There is some increased opacity inferiorly in the lingula on image 56 series 12, probably from atelectasis. Clustered subpleural nodules in the left lower lobe are observed, largest 7 by 6 by 6 mm (volume = 100 mm^3) on  image 52 series 12, previously the same by my measurements. Included Upper Abdomen: Unremarkable Musculoskeletal: Mild lower thoracic spondylosis. IMPRESSION: 1. New atelectasis in both lower lobes with some dependent ground-glass opacities in both lower lobes, potentially from atelectasis or alveolitis. 2. Stable subpleural nodules in the right upper lobe right middle lobe and left lower lobe, the larger nodule is 200 cubic mm. We have established 3.5 months of stability. Accordingly, noncontrast CT scan of the chest in 18 months is considered optional for low-risk patients, but is recommended for high-risk patients. This recommendation follows the consensus statement: Guidelines for Management of Incidental Pulmonary Nodules Detected on CT Images: From the Fleischner Society 2017; Radiology 2017; 284:228-243. 3. Mild lower thoracic spondylosis. 4. Descending thoracic aortic atherosclerotic vascular calcification. 5. Aortic atherosclerosis. Aortic Atherosclerosis (ICD10-I70.0). Electronically Signed   By: Van Clines M.D.   On: 06/23/2022 16:19   Result Date: 06/23/2022 CLINICAL DATA:  Chest pain, shortness of breath EXAM: Cardiac/Coronary  CTA TECHNIQUE: The patient was scanned on a Siemens Somatom go.Top scanner. : A retrospective scan was triggered in the descending thoracic aorta. Axial non-contrast 3 mm slices were carried out through the heart. The data set was analyzed on a dedicated work station and scored using the Dixon. Gantry rotation speed was 330 msecs and collimation was .6 mm. '100mg'$  of metoprolol and 0.8 mg of sl NTG was given. The 3D data set was reconstructed in 5% intervals of the 60-95 % of the R-R cycle. Diastolic phases were analyzed on a dedicated work station using MPR, MIP and VRT modes. The patient received 75 cc of contrast. FINDINGS: Aorta: Normal size. Mild aortic root and descending aorta calcifications. No dissection. Aortic Valve:  Trileaflet.  No calcifications.  Coronary Arteries:  Normal coronary origin.  Right dominance. RCA is a dominant artery.  There is no plaque. Left main gives rise to LAD and LCX arteries.  LM has no disease. LAD has no plaque. LCX is a non-dominant artery that gives rise to three obtuse marginal branches. There is no plaque. Other findings: Normal pulmonary vein drainage into the left atrium. Normal left atrial appendage without a thrombus. Normal size of the pulmonary artery. IMPRESSION: 1. Normal coronary calcium score of 0.  Patient is low risk. 2. Normal coronary origin with right dominance. 3. No evidence of CAD. 4. CAD-RADS 0. Consider non-atherosclerotic causes of chest pain. Electronically Signed: By: Kate Sable M.D. On: 06/23/2022 15:32       Assessment & Plan:  Hypercholesteremia -     Basic metabolic panel; Future -     Hepatic function panel; Future -     Lipid panel; Future  Hypothyroidism, unspecified type -     TSH; Future -     TSH  Encounter for screening mammogram for malignant neoplasm of breast -     3D Screening Mammogram, Left and Right; Future  Colon cancer screening  Health care maintenance Assessment & Plan: Physical today 08/12/22.  Colonoscopy 01/2018.  Recommended f/u in 5 years.  Mammogram 04/12/22- Briads I.  Bone density 03/2021 - osteopenia.  Continue calcium, vitamin D and weight bearing exercise.     Other orders -     Rosuvastatin Calcium; Take 1 tablet (10 mg total) by mouth daily.  Dispense: 90 tablet; Refill: 1     Einar Pheasant, MD

## 2022-08-13 ENCOUNTER — Encounter: Payer: Self-pay | Admitting: Internal Medicine

## 2022-08-13 NOTE — Assessment & Plan Note (Signed)
Being followed by cardiology.  Continue risk factor modification.  Continue crestor.

## 2022-08-13 NOTE — Assessment & Plan Note (Signed)
Carotid ultrasound - right: 40-59% and left:  1-39%.  Continue statin therapy.  Carotid ultrasound (04/2021) - recommended f/u carotid ultrasound every other year.

## 2022-08-13 NOTE — Assessment & Plan Note (Signed)
Did not tolerate repatha.  On crestor and zetia.  Follow lipid panel.  Continue low cholesterol diet and exercise.

## 2022-08-13 NOTE — Assessment & Plan Note (Signed)
Low carb diet and exercise.  Follow met b and a1c.   

## 2022-08-13 NOTE — Assessment & Plan Note (Signed)
Colonoscopy 01/2018.  Per pt, f/u colonoscopy recommended in 5 years.

## 2022-08-13 NOTE — Assessment & Plan Note (Signed)
On thyroid replacement.  Follow tsh.  

## 2022-08-13 NOTE — Assessment & Plan Note (Signed)
EGD: 12/2021 - stomach biopsy - benign changes c/w fundic gland polyp

## 2022-08-13 NOTE — Assessment & Plan Note (Signed)
Continue crestor 

## 2022-09-13 ENCOUNTER — Ambulatory Visit (INDEPENDENT_AMBULATORY_CARE_PROVIDER_SITE_OTHER): Payer: Medicare HMO

## 2022-09-13 DIAGNOSIS — R0602 Shortness of breath: Secondary | ICD-10-CM | POA: Diagnosis present

## 2022-09-13 DIAGNOSIS — J8410 Pulmonary fibrosis, unspecified: Secondary | ICD-10-CM

## 2022-09-13 DIAGNOSIS — R918 Other nonspecific abnormal finding of lung field: Secondary | ICD-10-CM | POA: Diagnosis present

## 2022-09-13 LAB — PULMONARY FUNCTION TEST ARMC ONLY
DL/VA % pred: 57 %
DL/VA: 2.39 ml/min/mmHg/L
DLCO unc % pred: 66 %
DLCO unc: 12.14 ml/min/mmHg
FEF 25-75 Post: 0.56 L/s
FEF 25-75 Pre: 0.97 L/s
FEF2575-%Change-Post: -42 %
FEF2575-%Pred-Post: 37 %
FEF2575-%Pred-Pre: 64 %
FEV1-%Change-Post: -12 %
FEV1-%Pred-Post: 79 %
FEV1-%Pred-Pre: 91 %
FEV1-Post: 1.56 L
FEV1-Pre: 1.78 L
FEV1FVC-%Change-Post: -1 %
FEV1FVC-%Pred-Pre: 87 %
FEV6-%Change-Post: -9 %
FEV6-%Pred-Post: 98 %
FEV6-%Pred-Pre: 108 %
FEV6-Post: 2.43 L
FEV6-Pre: 2.7 L
FEV6FVC-%Change-Post: 0 %
FEV6FVC-%Pred-Post: 104 %
FEV6FVC-%Pred-Pre: 103 %
FVC-%Change-Post: -10 %
FVC-%Pred-Post: 93 %
FVC-%Pred-Pre: 104 %
FVC-Post: 2.45 L
FVC-Pre: 2.74 L
Post FEV1/FVC ratio: 64 %
Post FEV6/FVC ratio: 99 %
Pre FEV1/FVC ratio: 65 %
Pre FEV6/FVC Ratio: 98 %
RV % pred: 100 %
RV: 2.29 L
TLC % pred: 104 %
TLC: 5.12 L

## 2022-09-13 MED ORDER — ALBUTEROL SULFATE (2.5 MG/3ML) 0.083% IN NEBU
2.5000 mg | INHALATION_SOLUTION | Freq: Once | RESPIRATORY_TRACT | Status: AC
  Administered 2022-09-13: 2.5 mg via RESPIRATORY_TRACT
  Filled 2022-09-13: qty 3

## 2022-09-14 ENCOUNTER — Ambulatory Visit
Admission: RE | Admit: 2022-09-14 | Discharge: 2022-09-14 | Disposition: A | Discharge status: Home / Self Care | Payer: Medicare HMO | Source: Ambulatory Visit | Attending: Pulmonary Disease | Admitting: Pulmonary Disease

## 2022-09-14 DIAGNOSIS — J479 Bronchiectasis, uncomplicated: Secondary | ICD-10-CM | POA: Diagnosis not present

## 2022-09-14 DIAGNOSIS — R0602 Shortness of breath: Secondary | ICD-10-CM | POA: Diagnosis not present

## 2022-09-14 DIAGNOSIS — R918 Other nonspecific abnormal finding of lung field: Secondary | ICD-10-CM | POA: Insufficient documentation

## 2022-09-14 DIAGNOSIS — J8410 Pulmonary fibrosis, unspecified: Secondary | ICD-10-CM | POA: Diagnosis not present

## 2022-09-21 ENCOUNTER — Ambulatory Visit: Payer: Medicare HMO | Admitting: Pulmonary Disease

## 2022-09-21 ENCOUNTER — Encounter: Payer: Self-pay | Admitting: Pulmonary Disease

## 2022-09-21 VITALS — BP 124/78 | HR 54 | Temp 97.6°F | Ht 63.5 in | Wt 139.4 lb

## 2022-09-21 DIAGNOSIS — R918 Other nonspecific abnormal finding of lung field: Secondary | ICD-10-CM

## 2022-09-21 DIAGNOSIS — R0602 Shortness of breath: Secondary | ICD-10-CM

## 2022-09-21 DIAGNOSIS — J849 Interstitial pulmonary disease, unspecified: Secondary | ICD-10-CM | POA: Diagnosis not present

## 2022-09-21 LAB — NITRIC OXIDE: Nitric Oxide: 14

## 2022-09-21 MED ORDER — AIRSUPRA 90-80 MCG/ACT IN AERO: 2.0000 | INHALATION_SPRAY | RESPIRATORY_TRACT | 0 refills | Status: DC | PRN

## 2022-09-21 NOTE — Progress Notes (Signed)
Subjective:    Patient ID: [name], female    DOB: 1945-11-09, 77 y.o.   MRN: [id number] Patient Care Team: Dale [place], MD as PCP - General (Internal Medicine) Salena Saner, MD as Consulting Physician (Pulmonary Disease)  Chief Complaint  Patient presents with   Follow-up    SOB with exertion. No wheezing or cough.   HPI [name] is a 77 year old remote former smoker with minimal tobacco exposure in the past who presents for follow-up on the issue of exertional shortness of breath, pulmonary nodules and ill characterized ILD.  Patient was initially evaluated here on 05 April 2022.  For the details of that visit please refer to that note.  Since her initial visit here she was diagnosed with flu a in December 2023.  This led to increasing issues with cough and chest congestion.  She did not receive Tamiflu due to being out of the window for the medication.  Eventually her symptoms subsided.  She was treated symptomatically.  She feels that she has done dramatically better.  She has not had any wheezing or cough.  She does still note dyspnea on exertion on occasion.  He still tries to stay active with yoga and walking.  She had pulmonary function testing performed on the 2 April and a high-resolution CT chest on 3 April.  She is here to discuss these tests.  PFTs show FEV1 of 1.78 L or 91% predicted, FVC 2.74 L or 104% predicted, FEV1/FVC of 65%, no bronchodilator response.  Lung volumes are normal.  Diffusion capacity mildly reduced.  Consistent with mild obstructive airways disease and mild diffusion defect.  CT chest high-resolution showed unchanged mild pulmonary fibrosis in a pattern that shows mostly, mild traction bronchiectasis and minimal subpleural bronchiolectasis.  No other findings are categorized as probable UIP I do not concur with these findings.  He has few small bilateral pulmonary nodules unchanged measuring 5 mm or less in diameter.  These are likely benign.  I  discussed the above findings with the patient and her husband who is with her today.  She does not endorse any active symptomatology today.  Overall she feels well and looks well.  Review of Systems A 10 point review of systems was performed and it is as noted above otherwise negative.  Patient Active Problem List   Diagnosis Date Noted   Osteoarthritis of foot 05/13/2022   CAD (coronary artery disease) 05/03/2022   History of COVID-19 02/10/2022   Pain in both feet 10/31/2021   Allergic rhinitis 10/29/2021   Joint pain 06/20/2021   TMJ arthralgia 06/20/2021   Numbness of finger 06/20/2021   Carotid artery disease 04/21/2021   Left carotid bruit 03/03/2021   Aortic atherosclerosis 10/21/2020   Hyperglycemia 04/12/2020   Change in bowel habits 01/16/2020   Hot flashes 09/23/2019   Sinusitis 05/21/2019   Left hip pain 02/19/2019   Axillary fullness 02/19/2019   Lymphadenopathy 10/17/2018   Neck pain 04/29/2018   Swelling of right hand 12/24/2017   Loose stools 08/02/2015   Health care maintenance 01/29/2015   Right foot pain 02/17/2014   History of colonic polyps 05/07/2013   Stress 02/27/2013   Hypercholesteremia 03/26/2012   Hypothyroidism 03/26/2012   GERD (gastroesophageal reflux disease) 03/26/2012   Social History   Tobacco Use   Smoking status: Former    Packs/day: 0.25    Years: 5.00    Additional pack years: 0.00    Total pack years: 1.25    Types:  Cigarettes   Smokeless tobacco: Never   Tobacco comments:    Smoked a pack a week in her early 20's. Smoked for 5 years.  Substance Use Topics   Alcohol use: Never    Comment: rare   Allergies  Allergen Reactions   Paxlovid [Nirmatrelvir-Ritonavir] Other (See Comments)    Severe sore throat   Diclofenac Nausea Only   Ibuprofen Other (See Comments)    Stomach issues   Tramadol Nausea Only   Hydromorphone Rash   Oxycodone Other (See Comments)    constipation   Current Meds  Medication Sig    acetaminophen (TYLENOL) 500 MG tablet Take 500 mg by mouth every 6 (six) hours as needed.   Ascorbic Acid (VITAMIN C) 500 MG CAPS Take 500 mg by mouth daily.   aspirin EC 81 MG tablet Take 1 tablet (81 mg total) by mouth daily. Swallow whole.   B Complex Vitamins (VITAMIN B-COMPLEX) TABS Take 1 tablet by mouth daily.   busPIRone (BUSPAR) 5 MG tablet Take 5 mg by mouth daily as needed.   chlorhexidine (PERIDEX) 0.12 % solution SMARTSIG:By Mouth   Cholecalciferol (VITAMIN D PO) Take 10,000 Units by mouth daily.    Coenzyme Q10 (COQ-10 PO) Take 120 mg by mouth daily.   COLLAGEN PO Take 11 mg by mouth daily.   diclofenac sodium (VOLTAREN) 1 % GEL Apply 1 application topically as needed.   ELDERBERRY PO Take 1 capsule by mouth daily. Takes one gummy daily   fluocinonide cream (LIDEX) 0.05 % Apply 1 application topically 2 (two) times daily as needed.    Hypertonic Nasal Wash (SINUS RINSE NA) Place into the nose as needed.   levothyroxine (SYNTHROID) 50 MCG tablet TAKE 1 TABLET EVERY OTHER  DAY   levothyroxine (SYNTHROID) 75 MCG tablet TAKE 1 TABLET EVERY OTHER  DAY   NON FORMULARY DE 3 Omega Benefits TID   Omega-3 Fatty Acids (FISH OIL PO) Take 1,200 mg by mouth daily.   omeprazole (PRILOSEC) 40 MG capsule TAKE 1 CAPSULE DAILY   Probiotic Product (PROBIOTIC DAILY PO) Take 1 capsule by mouth.   rosuvastatin (CRESTOR) 10 MG tablet Take 1 tablet (10 mg total) by mouth daily.   sodium chloride irrigation 0.9 % irrigation sodium chloride 0.9 % irrigation solution   UNABLE TO FIND Take 1 Capful by mouth daily. Mega B-50   vitamin E 400 UNIT capsule Take 400 Units by mouth daily.   Immunization History  Administered Date(s) Administered   Fluad Quad(high Dose 65+) 02/19/2019, 04/07/2020, 02/22/2021   Influenza Split 03/26/2012   Influenza, High Dose Seasonal PF 03/07/2017, 03/09/2018   Influenza,inj,Quad PF,6+ Mos 02/25/2013, 02/14/2014, 03/26/2015, 02/02/2016   Influenza,inj,quad, With  Preservative 03/13/2017, 03/13/2018   PFIZER(Purple Top)SARS-COV-2 Vaccination 10/18/2019, 11/12/2019   Pneumococcal Conjugate-13 04/22/2015   Pneumococcal Polysaccharide-23 01/24/2011, 06/14/2015   Tdap 11/17/2015       Objective:   Physical Exam BP 124/78 (BP Location: Left Arm, Cuff Size: Normal)   Pulse (!) 54   Temp 97.6 F (36.4 C)   Ht 5' 3.5" (1.613 m)   Wt 139 lb 6.4 oz (63.2 kg)   SpO2 98%   BMI 24.31 kg/m   SpO2: 98 % O2 Device: None (Room air)  GENERAL: Well-developed, well-nourished woman, no acute distress, fully ambulatory, no conversational dyspnea. HEAD: Normocephalic, atraumatic.  EYES: Pupils equal, round, reactive to light.  No scleral icterus.  MOUTH: Dentition intact, oral mucosa moist. NECK: Supple. No thyromegaly. Trachea midline. No JVD.  No adenopathy. PULMONARY:  Good air entry bilaterally.  No adventitious sounds. CARDIOVASCULAR: S1 and S2. Regular rate and rhythm.  ABDOMEN: Benign. MUSCULOSKELETAL: No joint deformity, no clubbing, no edema.  NEUROLOGIC: No overt focal deficit, no gait disturbance, speech is fluent. SKIN: Intact,warm,dry. PSYCH: Mood and behavior normal.   Recent Results (from the past 2160 hour(s))  Lipid panel     Status: None   Collection Time: 08/10/22  8:50 AM  Result Value Ref Range   Cholesterol 183 0 - 200 mg/dL    Comment: ATP III Classification       Desirable:  < 200 mg/dL               Borderline High:  200 - 239 mg/dL          High:  > = 191 mg/dL   Triglycerides 478.2 0.0 - 149.0 mg/dL    Comment: Normal:  <956 mg/dLBorderline High:  150 - 199 mg/dL   HDL 21.30 >86.57 mg/dL   VLDL 84.6 0.0 - 96.2 mg/dL   LDL Cholesterol 93 0 - 99 mg/dL   Total CHOL/HDL Ratio 3     Comment:                Men          Women1/2 Average Risk     3.4          3.3Average Risk          5.0          4.42X Average Risk          9.6          7.13X Average Risk          15.0          11.0                       NonHDL 122.50      Comment: NOTE:  Non-HDL goal should be 30 mg/dL higher than patient's LDL goal (i.e. LDL goal of < 70 mg/dL, would have non-HDL goal of < 100 mg/dL)  Hemoglobin X5M     Status: None   Collection Time: 08/10/22  8:50 AM  Result Value Ref Range   Hgb A1c MFr Bld 5.4 4.6 - 6.5 %    Comment: Glycemic Control Guidelines for People with Diabetes:Non Diabetic:  <6%Goal of Therapy: <7%Additional Action Suggested:  >8%   Hepatic function panel     Status: None   Collection Time: 08/10/22  8:50 AM  Result Value Ref Range   Total Bilirubin 0.5 0.2 - 1.2 mg/dL   Bilirubin, Direct 0.1 0.0 - 0.3 mg/dL   Alkaline Phosphatase 65 39 - 117 U/L   AST 21 0 - 37 U/L   ALT 18 0 - 35 U/L   Total Protein 6.9 6.0 - 8.3 g/dL   Albumin 4.2 3.5 - 5.2 g/dL  Basic metabolic panel     Status: None   Collection Time: 08/10/22  8:50 AM  Result Value Ref Range   Sodium 141 135 - 145 mEq/L   Potassium 4.3 3.5 - 5.1 mEq/L   Chloride 104 96 - 112 mEq/L   CO2 31 19 - 32 mEq/L   Glucose, Bld 83 70 - 99 mg/dL   BUN 15 6 - 23 mg/dL   Creatinine, Ser 8.41 0.40 - 1.20 mg/dL   GFR 32.44 >01.02 mL/min    Comment: Calculated using the CKD-EPI Creatinine Equation (2021)   Calcium  10.1 8.4 - 10.5 mg/dL  TSH     Status: None   Collection Time: 08/12/22  1:49 PM  Result Value Ref Range   TSH 1.42 0.35 - 5.50 uIU/mL  Pulmonary Function Test [facility] Only     Status: None   Collection Time: 09/13/22 11:32 AM  Result Value Ref Range   FVC-Pre 2.74 L   FVC-%Pred-Pre 104 %   FVC-Post 2.45 L   FVC-%Pred-Post 93 %   FVC-%Change-Post -10 %   FEV1-Pre 1.78 L   FEV1-%Pred-Pre 91 %   FEV1-Post 1.56 L   FEV1-%Pred-Post 79 %   FEV1-%Change-Post -12 %   FEV6-Pre 2.70 L   FEV6-%Pred-Pre 108 %   FEV6-Post 2.43 L   FEV6-%Pred-Post 98 %   FEV6-%Change-Post -9 %   Pre FEV1/FVC ratio 65 %   FEV1FVC-%Pred-Pre 87 %   Post FEV1/FVC ratio 64 %   FEV1FVC-%Change-Post -1 %   Pre FEV6/FVC Ratio 98 %   FEV6FVC-%Pred-Pre 103 %   Post  FEV6/FVC ratio 99 %   FEV6FVC-%Pred-Post 104 %   FEV6FVC-%Change-Post 0 %   FEF 25-75 Pre 0.97 L/sec   FEF2575-%Pred-Pre 64 %   FEF 25-75 Post 0.56 L/sec   FEF2575-%Pred-Post 37 %   FEF2575-%Change-Post -42 %   RV 2.29 L   RV % pred 100 %   TLC 5.12 L   TLC % pred 104 %   DLCO unc 12.14 ml/min/mmHg   DLCO unc % pred 66 %   DL/VA 1.612.39 ml/min/mmHg/L   DL/VA % pred 57 %   Lab Results  Component Value Date   NITRICOXIDE 14 09/21/2022  *No evidence of type II inflammation      Assessment & Plan:     ICD-10-CM   1. Shortness of breath  R06.02 Nitric oxide   Of AirSupra 2 puffs every 6 as needed Suspect airways reactivity    2. ILD (interstitial lung disease)  J84.9    Ill characterized May be related to Sjogren's versus postinflammatory Patient has chronic dry eye and positive Sjogren serology She follows with rheumatology    3. Multiple lung nodules  R91.8    Unchanged 5 mm in diameter or less Likely benign etiology     Meds ordered this encounter  Medications   Albuterol-Budesonide (AIRSUPRA) 90-80 MCG/ACT AERO    Sig: Inhale 2 puffs into the lungs every 4 (four) hours as needed.    Dispense:  10.7 g    Refill:  0    Order Specific Question:   Lot Number?    Answer:   [id number]    Order Specific Question:   Expiration Date?    Answer:   07/15/2023    Order Specific Question:   Manufacturer?    Answer:   [employer] [[id number]]    Order Specific Question:   Quantity    Answer:   1   Given the patient a trial of AirSupra to use as needed for shortness of breath.  She is to let us know if she has to use this more than 4-5 times a week at which point it would likely be necessary for her to be on a maintenance inhaler.  We will see her in follow-up in 4 to 6 months time she is to contact us prior to that time should any new difficulties arise.  Gailen Shelter. Laura Jerianne Anselmo, MD Advanced Bronchoscopy [facility] [place]    *This note was dictated using voice  recognition software/Dragon.  Despite best efforts to proofread, errors  can occur which can change the meaning. Any transcriptional errors that result from this process are unintentional and may not be fully corrected at the time of dictation.

## 2022-09-21 NOTE — Patient Instructions (Addendum)
We are giving you a sample of an inhaler called AirSupra this is 2 puffs up to 4 times a day as needed you can use it for cough or if you feel short of breath.  Let us know if this helps you.  This will give me an indication if you need an inhaler as maintenance due to mild asthma that is noted on your breathing tests.  We discussed that your chest CT looks actually very good.  With regards to the lung nodules no further imaging is needed at these are more than likely benign (not cancer).  We will see you in follow-up in 4 to 6 months time call sooner should any new problems arise.

## 2022-09-23 ENCOUNTER — Other Ambulatory Visit: Payer: Self-pay | Admitting: Internal Medicine

## 2022-10-05 ENCOUNTER — Other Ambulatory Visit: Payer: Medicare HMO

## 2022-10-10 DIAGNOSIS — L821 Other seborrheic keratosis: Secondary | ICD-10-CM | POA: Diagnosis not present

## 2022-10-10 DIAGNOSIS — L814 Other melanin hyperpigmentation: Secondary | ICD-10-CM | POA: Diagnosis not present

## 2022-10-10 DIAGNOSIS — L565 Disseminated superficial actinic porokeratosis (DSAP): Secondary | ICD-10-CM | POA: Diagnosis not present

## 2022-10-10 DIAGNOSIS — L570 Actinic keratosis: Secondary | ICD-10-CM | POA: Diagnosis not present

## 2022-10-10 DIAGNOSIS — D225 Melanocytic nevi of trunk: Secondary | ICD-10-CM | POA: Diagnosis not present

## 2022-10-11 DIAGNOSIS — H25043 Posterior subcapsular polar age-related cataract, bilateral: Secondary | ICD-10-CM | POA: Diagnosis not present

## 2022-10-11 DIAGNOSIS — H18413 Arcus senilis, bilateral: Secondary | ICD-10-CM | POA: Diagnosis not present

## 2022-10-11 DIAGNOSIS — H25013 Cortical age-related cataract, bilateral: Secondary | ICD-10-CM | POA: Diagnosis not present

## 2022-10-11 DIAGNOSIS — H2511 Age-related nuclear cataract, right eye: Secondary | ICD-10-CM | POA: Diagnosis not present

## 2022-10-11 DIAGNOSIS — H2513 Age-related nuclear cataract, bilateral: Secondary | ICD-10-CM | POA: Diagnosis not present

## 2022-10-17 DIAGNOSIS — R69 Illness, unspecified: Secondary | ICD-10-CM | POA: Diagnosis not present

## 2022-11-15 DIAGNOSIS — M159 Polyosteoarthritis, unspecified: Secondary | ICD-10-CM | POA: Diagnosis not present

## 2022-11-15 DIAGNOSIS — J849 Interstitial pulmonary disease, unspecified: Secondary | ICD-10-CM | POA: Diagnosis not present

## 2022-11-15 DIAGNOSIS — R768 Other specified abnormal immunological findings in serum: Secondary | ICD-10-CM | POA: Diagnosis not present

## 2022-11-23 DIAGNOSIS — M19072 Primary osteoarthritis, left ankle and foot: Secondary | ICD-10-CM | POA: Diagnosis not present

## 2022-11-23 DIAGNOSIS — M19071 Primary osteoarthritis, right ankle and foot: Secondary | ICD-10-CM | POA: Diagnosis not present

## 2022-12-08 ENCOUNTER — Other Ambulatory Visit (INDEPENDENT_AMBULATORY_CARE_PROVIDER_SITE_OTHER): Payer: Medicare HMO

## 2022-12-08 DIAGNOSIS — E7800 Pure hypercholesterolemia, unspecified: Secondary | ICD-10-CM

## 2022-12-08 DIAGNOSIS — E039 Hypothyroidism, unspecified: Secondary | ICD-10-CM | POA: Diagnosis not present

## 2022-12-08 LAB — LIPID PANEL
Cholesterol: 186 mg/dL (ref 0–200)
HDL: 58.8 mg/dL
LDL Cholesterol: 107 mg/dL — ABNORMAL HIGH (ref 0–99)
NonHDL: 126.9
Total CHOL/HDL Ratio: 3
Triglycerides: 102 mg/dL (ref 0.0–149.0)
VLDL: 20.4 mg/dL (ref 0.0–40.0)

## 2022-12-08 LAB — BASIC METABOLIC PANEL WITH GFR
BUN: 15 mg/dL (ref 6–23)
CO2: 30 meq/L (ref 19–32)
Calcium: 9.7 mg/dL (ref 8.4–10.5)
Chloride: 103 meq/L (ref 96–112)
Creatinine, Ser: 0.73 mg/dL (ref 0.40–1.20)
GFR: 79.15 mL/min
Glucose, Bld: 94 mg/dL (ref 70–99)
Potassium: 4.3 meq/L (ref 3.5–5.1)
Sodium: 141 meq/L (ref 135–145)

## 2022-12-08 LAB — TSH: TSH: 3.32 u[IU]/mL (ref 0.35–5.50)

## 2022-12-08 LAB — HEPATIC FUNCTION PANEL
ALT: 16 U/L (ref 0–35)
AST: 21 U/L (ref 0–37)
Albumin: 4.2 g/dL (ref 3.5–5.2)
Alkaline Phosphatase: 57 U/L (ref 39–117)
Bilirubin, Direct: 0.1 mg/dL (ref 0.0–0.3)
Total Bilirubin: 0.6 mg/dL (ref 0.2–1.2)
Total Protein: 6.8 g/dL (ref 6.0–8.3)

## 2022-12-14 ENCOUNTER — Encounter: Payer: Self-pay | Admitting: Internal Medicine

## 2022-12-14 ENCOUNTER — Ambulatory Visit (INDEPENDENT_AMBULATORY_CARE_PROVIDER_SITE_OTHER): Payer: Medicare HMO | Admitting: Internal Medicine

## 2022-12-14 VITALS — BP 124/72 | HR 77 | Temp 98.2°F | Resp 16 | Ht 63.5 in | Wt 137.0 lb

## 2022-12-14 DIAGNOSIS — Z860100 Personal history of colon polyps, unspecified: Secondary | ICD-10-CM

## 2022-12-14 DIAGNOSIS — I779 Disorder of arteries and arterioles, unspecified: Secondary | ICD-10-CM | POA: Diagnosis not present

## 2022-12-14 DIAGNOSIS — R591 Generalized enlarged lymph nodes: Secondary | ICD-10-CM

## 2022-12-14 DIAGNOSIS — E039 Hypothyroidism, unspecified: Secondary | ICD-10-CM

## 2022-12-14 DIAGNOSIS — I2510 Atherosclerotic heart disease of native coronary artery without angina pectoris: Secondary | ICD-10-CM

## 2022-12-14 DIAGNOSIS — F439 Reaction to severe stress, unspecified: Secondary | ICD-10-CM | POA: Diagnosis not present

## 2022-12-14 DIAGNOSIS — R739 Hyperglycemia, unspecified: Secondary | ICD-10-CM | POA: Diagnosis not present

## 2022-12-14 DIAGNOSIS — I700 Atherosclerosis of aorta: Secondary | ICD-10-CM | POA: Diagnosis not present

## 2022-12-14 DIAGNOSIS — K2100 Gastro-esophageal reflux disease with esophagitis, without bleeding: Secondary | ICD-10-CM | POA: Diagnosis not present

## 2022-12-14 DIAGNOSIS — E7800 Pure hypercholesterolemia, unspecified: Secondary | ICD-10-CM | POA: Diagnosis not present

## 2022-12-14 DIAGNOSIS — J0110 Acute frontal sinusitis, unspecified: Secondary | ICD-10-CM | POA: Diagnosis not present

## 2022-12-14 DIAGNOSIS — R0981 Nasal congestion: Secondary | ICD-10-CM

## 2022-12-14 LAB — POC COVID19 BINAXNOW: SARS Coronavirus 2 Ag: NEGATIVE

## 2022-12-14 MED ORDER — ROSUVASTATIN CALCIUM 10 MG PO TABS: 10.0000 mg | ORAL_TABLET | Freq: Every day | ORAL | 3 refills | Status: DC

## 2022-12-14 MED ORDER — OMEPRAZOLE 40 MG PO CPDR: 40.0000 mg | DELAYED_RELEASE_CAPSULE | Freq: Every day | ORAL | 3 refills | Status: DC

## 2022-12-14 MED ORDER — CEFDINIR 300 MG PO CAPS: 300.0000 mg | ORAL_CAPSULE | Freq: Two times a day (BID) | ORAL | 0 refills | Status: DC

## 2022-12-14 MED ORDER — EZETIMIBE 10 MG PO TABS: 10.0000 mg | ORAL_TABLET | Freq: Every day | ORAL | 1 refills | Status: DC

## 2022-12-14 NOTE — Patient Instructions (Addendum)
Continue mucinex.   Continue saline nasal spray.    Use the airsupra - as needed.  Take the antibiotic twice a day  Take a probiotic daily while on the antibiotic and for 2 weeks after completing the antibiotic.    Start the zetia - one per day.

## 2022-12-14 NOTE — Progress Notes (Unsigned)
Subjective:    Patient ID: [name], female    DOB: 1946-02-16, 77 y.o.   MRN: [id number]  Patient here for  Chief Complaint  Patient presents with   Medical Management of Chronic Issues    HPI Here to follow up regarding hypercholesterolemia.  Had problems with repatha. Back on crestor.  Not taking zetia.  Discussed restarting.  Also taking 81mg  aspirin. Stays active. Saw Dr Jayme Cloud 09/21/22 - PFTs show FEV1 of 1.78 L or 91% predicted, FVC 2.74 L or 104% predicted, FEV1/FVC of 65%, no bronchodilator response. Lung volumes are normal. Diffusion capacity mildly reduced. Consistent with mild obstructive airways disease and mild diffusion defect.CT chest high-resolution showed unchanged mild pulmonary fibrosis in a pattern that shows mostly, mild traction bronchiectasis and minimal subpleural bronchiolectasis.  Recommended AirSupra.  Not using  discussed starting. Also continue f/u with rheumatology. Last evaluated by Dr Allena Katz 11/15/22.  Felt exam most c/w OA.  Had questions about sjogrens. S/p cataract surgery.  Using drops.  Continue f/u with ophthalmology.  Reports starting 11/28/22 - sinus infection.  Symptoms improved.  Walking - park - weed eating - noticed starting 12/10/22 - chest congestion, runny nose and sinus congestion.  No sore throat.  Some neck discomfort.  Taking mucinex.  No chest pain or sob reported.   Colonoscopy 01/2018. Per pt, f/u colonoscopy recommended in 5 years.   Past Medical History:  Diagnosis Date   Arthritis    Environmental allergies    Esophagitis    Barrets, followed by Dr Elsie Amis   GERD (gastroesophageal reflux disease)    Hypercholesterolemia    Hypothyroidism    Past Surgical History:  Procedure Laterality Date   ANKLE SURGERY  1967   KNEE ARTHROSCOPY  12/03/07   left   KNEE ARTHROSCOPY  06/25/08   left   NASAL SINUS SURGERY  12/06   REPLACEMENT TOTAL KNEE  01/04/09   left   RHINOPLASTY  1986   VAGINAL HYSTERECTOMY  1980   ovaries left in  place   WRIST SURGERY  1970   cyst removal   Family History  Problem Relation Age of Onset   Pulmonary disease Mother    Heart disease Father        heart attack   Diabetes Father    Multiple myeloma Brother    Breast cancer Maternal Aunt    Breast cancer Maternal Aunt    Prostate cancer Maternal Uncle    Hyperlipidemia Paternal Grandmother    Diabetes Paternal Grandmother    Stroke Paternal Grandmother    Hyperlipidemia Paternal Grandfather    Diabetes Paternal Grandfather    Pancreatic cancer Cousin    Social History   Socioeconomic History   Marital status: Married    Spouse name: Not on file   Number of children: Not on file   Years of education: Not on file   Highest education level: Not on file  Occupational History   Not on file  Tobacco Use   Smoking status: Former    Packs/day: 0.25    Years: 5.00    Additional pack years: 0.00    Total pack years: 1.25    Types: Cigarettes   Smokeless tobacco: Never   Tobacco comments:    Smoked a pack a week in her early 20's. Smoked for 5 years.  Vaping Use   Vaping Use: Never used  Substance and Sexual Activity   Alcohol use: Never    Comment: rare   Drug use:  No   Sexual activity: Yes  Other Topics Concern   Not on file  Social History Narrative   Not on file   Social Determinants of Health   Financial Resource Strain: Low Risk  (04/18/2022)   Overall Financial Resource Strain (CARDIA)    Difficulty of Paying Living Expenses: Not hard at all  Food Insecurity: No Food Insecurity (04/18/2022)   Hunger Vital Sign    Worried About Running Out of Food in the Last Year: Never true    Ran Out of Food in the Last Year: Never true  Transportation Needs: No Transportation Needs (04/18/2022)   PRAPARE - Administrator, Civil Service (Medical): No    Lack of Transportation (Non-Medical): No  Physical Activity: Insufficiently Active (02/22/2021)   Exercise Vital Sign    Days of Exercise per Week: 2 days     Minutes of Exercise per Session: 60 min  Stress: No Stress Concern Present (04/18/2022)   [employer] of Occupational Health - Occupational Stress Questionnaire    Feeling of Stress : Not at all  Social Connections: Unknown (04/18/2022)   Social Connection and Isolation Panel [NHANES]    Frequency of Communication with Friends and Family: More than three times a week    Frequency of Social Gatherings with Friends and Family: Not on file    Attends Religious Services: Not on file    Active Member of Clubs or Organizations: Not on file    Attends Banker Meetings: Not on file    Marital Status: Married     Review of Systems  Constitutional:  Negative for appetite change, fever and unexpected weight change.  HENT:  Positive for congestion and sinus pressure. Negative for sore throat.   Respiratory:  Positive for cough. Negative for chest tightness and shortness of breath.   Cardiovascular:  Negative for chest pain and palpitations.  Gastrointestinal:  Negative for abdominal pain, diarrhea, nausea and vomiting.  Genitourinary:  Negative for difficulty urinating and dysuria.  Musculoskeletal:  Negative for joint swelling and myalgias.  Skin:  Negative for color change and rash.  Neurological:  Negative for dizziness and headaches.  Psychiatric/Behavioral:  Negative for agitation and dysphoric mood.        Objective:     BP 124/72   Pulse 77   Temp 98.2 F (36.8 C)   Resp 16   Ht 5' 3.5" (1.613 m)   Wt 137 lb (62.1 kg)   SpO2 98%   BMI 23.89 kg/m  Wt Readings from Last 3 Encounters:  12/14/22 137 lb (62.1 kg)  09/21/22 139 lb 6.4 oz (63.2 kg)  08/12/22 136 lb (61.7 kg)    Physical Exam Vitals reviewed.  Constitutional:      General: She is not in acute distress.    Appearance: Normal appearance.  HENT:     Head: Normocephalic and atraumatic.     Right Ear: External ear normal.     Left Ear: External ear normal.  Eyes:     General: No scleral  icterus.       Right eye: No discharge.        Left eye: No discharge.     Conjunctiva/sclera: Conjunctivae normal.  Neck:     Thyroid: No thyromegaly.  Cardiovascular:     Rate and Rhythm: Normal rate and regular rhythm.  Pulmonary:     Effort: No respiratory distress.     Breath sounds: Normal breath sounds. No wheezing.  Abdominal:  General: Bowel sounds are normal.     Palpations: Abdomen is soft.     Tenderness: There is no abdominal tenderness.  Musculoskeletal:        General: No swelling or tenderness.     Cervical back: Neck supple. No tenderness.  Lymphadenopathy:     Cervical: No cervical adenopathy.  Skin:    Findings: No erythema or rash.  Neurological:     Mental Status: She is alert.  Psychiatric:        Mood and Affect: Mood normal.        Behavior: Behavior normal.      Outpatient Encounter Medications as of 12/14/2022  Medication Sig   cefdinir (OMNICEF) 300 MG capsule Take 1 capsule (300 mg total) by mouth 2 (two) times daily.   ezetimibe (ZETIA) 10 MG tablet Take 1 tablet (10 mg total) by mouth daily.   acetaminophen (TYLENOL) 500 MG tablet Take 500 mg by mouth every 6 (six) hours as needed.   Albuterol-Budesonide (AIRSUPRA) 90-80 MCG/ACT AERO Inhale 2 puffs into the lungs every 4 (four) hours as needed.   Ascorbic Acid (VITAMIN C) 500 MG CAPS Take 500 mg by mouth daily.   aspirin EC 81 MG tablet Take 1 tablet (81 mg total) by mouth daily. Swallow whole.   B Complex Vitamins (VITAMIN B-COMPLEX) TABS Take 1 tablet by mouth daily.   busPIRone (BUSPAR) 5 MG tablet Take 5 mg by mouth daily as needed.   chlorhexidine (PERIDEX) 0.12 % solution SMARTSIG:By Mouth   Cholecalciferol (VITAMIN D PO) Take 10,000 Units by mouth daily.    Coenzyme Q10 (COQ-10 PO) Take 120 mg by mouth daily.   COLLAGEN PO Take 11 mg by mouth daily.   diclofenac sodium (VOLTAREN) 1 % GEL Apply 1 application topically as needed.   ELDERBERRY PO Take 1 capsule by mouth daily. Takes one  gummy daily   fluocinonide cream (LIDEX) 0.05 % Apply 1 application topically 2 (two) times daily as needed.    Hypertonic Nasal Wash (SINUS RINSE NA) Place into the nose as needed.   levothyroxine (SYNTHROID) 50 MCG tablet TAKE 1 TABLET EVERY OTHER  DAY   levothyroxine (SYNTHROID) 75 MCG tablet TAKE 1 TABLET EVERY OTHER  DAY   NON FORMULARY DE 3 Omega Benefits TID   Omega-3 Fatty Acids (FISH OIL PO) Take 1,200 mg by mouth daily.   omeprazole (PRILOSEC) 40 MG capsule Take 1 capsule (40 mg total) by mouth daily.   Probiotic Product (PROBIOTIC DAILY PO) Take 1 capsule by mouth.   rosuvastatin (CRESTOR) 10 MG tablet Take 1 tablet (10 mg total) by mouth daily.   sodium chloride irrigation 0.9 % irrigation sodium chloride 0.9 % irrigation solution   UNABLE TO FIND Take 1 Capful by mouth daily. Mega B-50   vitamin E 400 UNIT capsule Take 400 Units by mouth daily.   [DISCONTINUED] BUDESONIDE PO 2 (two) times daily. (Patient not taking: Reported on 09/21/2022)   [DISCONTINUED] omeprazole (PRILOSEC) 40 MG capsule TAKE 1 CAPSULE DAILY   [DISCONTINUED] omeprazole (PRILOSEC) 40 MG capsule Take 1 capsule (40 mg total) by mouth daily.   [DISCONTINUED] rosuvastatin (CRESTOR) 10 MG tablet Take 1 tablet (10 mg total) by mouth daily.   [DISCONTINUED] rosuvastatin (CRESTOR) 10 MG tablet Take 1 tablet (10 mg total) by mouth daily.   No facility-administered encounter medications on file as of 12/14/2022.     Lab Results  Component Value Date   WBC 6.0 10/18/2021   HGB 12.5 10/18/2021  HCT 36.6 10/18/2021   PLT 219.0 10/18/2021   GLUCOSE 94 12/08/2022   CHOL 186 12/08/2022   TRIG 102.0 12/08/2022   HDL 58.80 12/08/2022   LDLDIRECT 123.4 02/27/2013   LDLCALC 107 (H) 12/08/2022   ALT 16 12/08/2022   AST 21 12/08/2022   NA 141 12/08/2022   K 4.3 12/08/2022   CL 103 12/08/2022   CREATININE 0.73 12/08/2022   BUN 15 12/08/2022   CO2 30 12/08/2022   TSH 3.32 12/08/2022   HGBA1C 5.4 08/10/2022    CT  Chest High Resolution  Result Date: 09/14/2022 CLINICAL DATA:  Lung nodule, pulmonary fibrosis EXAM: CT CHEST WITHOUT CONTRAST TECHNIQUE: Multidetector CT imaging of the chest was performed following the standard protocol without intravenous contrast. High resolution imaging of the lungs, as well as inspiratory and expiratory imaging, was performed. RADIATION DOSE REDUCTION: This exam was performed according to the departmental dose-optimization program which includes automated exposure control, adjustment of the mA and/or kV according to patient size and/or use of iterative reconstruction technique. COMPARISON:  03/09/2022 FINDINGS: Cardiovascular: Aortic atherosclerosis. Normal heart size. No pericardial effusion. Mediastinum/Nodes: No enlarged mediastinal, hilar, or axillary lymph nodes. Thyroid gland, trachea, and esophagus demonstrate no significant findings. Lungs/Pleura: Unchanged mild pulmonary fibrosis in a pattern with apical to basal gradient, featuring irregular peripheral interstitial opacity, septal thickening, and mild traction bronchiectasis with minimal subpleural bronchiolectasis but without traction bronchiectasis. Occasional small bilateral pulmonary nodules are unchanged measuring 0.5 cm and smaller (e.g. Series 4, image 69). No significant air trapping on expiratory phase imaging. No pleural effusion or pneumothorax. Upper Abdomen: No acute abnormality. Musculoskeletal: No chest wall abnormality. No acute osseous findings. IMPRESSION: 1. Unchanged mild pulmonary fibrosis in a pattern with apical to basal gradient, featuring irregular peripheral interstitial opacity, septal thickening, and mild traction bronchiectasis with minimal subpleural bronchiolectasis but without traction bronchiectasis. Findings are categorized as probable UIP per consensus guidelines: Diagnosis of Idiopathic Pulmonary Fibrosis: An Official ATS/ERS/JRS/ALAT Clinical Practice Guideline. Am Rosezetta Schlatter Crit Care Med Vol 198,  Iss 5, [id number], Feb 11 2017. 2. Occasional small bilateral pulmonary nodules are unchanged measuring 0.5 cm and smaller. No specific further follow-up or characterization is required. Aortic Atherosclerosis (ICD10-I70.0). Electronically Signed   By: Jearld Lesch M.D.   On: 09/14/2022 15:38       Assessment & Plan:  Hypercholesteremia Assessment & Plan: Did not tolerate repatha.  On crestor.  Not taking zetia.  Will restart.  Follow lipid panel.  Continue low cholesterol diet and exercise.    Orders: -     Lipid panel; Future -     Hepatic function panel; Future -     Basic metabolic panel; Future -     CBC with Differential/Platelet; Future  Hyperglycemia Assessment & Plan: Low carb diet and exercise.  Follow met b and a1c.   Orders: -     Hemoglobin A1c; Future  Nasal congestion -     POC COVID-19 BinaxNow  Aortic atherosclerosis (HCC) Assessment & Plan: Continue crestor.    Coronary artery disease involving native coronary artery of native heart without angina pectoris Assessment & Plan: Being followed by cardiology.  Continue risk factor modification.  Continue crestor.    Bilateral carotid artery disease, unspecified type (HCC) Assessment & Plan: Carotid ultrasound - right: 40-59% and left:  1-39%.  Continue statin therapy.  Carotid ultrasound (04/2021) - recommended f/u carotid ultrasound every other year.    Gastroesophageal reflux disease with esophagitis without hemorrhage Assessment & Plan: EGD: 12/2021 -  stomach biopsy - benign changes c/w fundic gland polyp    History of colonic polyps Assessment & Plan: Colonoscopy 01/2018.  Per pt, f/u colonoscopy recommended in 5 years. Plans to f/u with GI   Hypothyroidism, unspecified type Assessment & Plan: On thyroid replacement.  Follow tsh.    Lymphadenopathy Assessment & Plan: Has f/u planned with Dr Andee Poles.    Stress Assessment & Plan: Overall appears to be handling things relatively well.  Has  buspar to take prn.  Follow.    Acute non-recurrent frontal sinusitis Assessment & Plan: Sinusitis/URI.  Symptoms as outlined.  Saline nasal spray.  Start [place].  Continue mucinex DM.  Omnicef as directed.  Follow.     Other orders -     Ezetimibe; Take 1 tablet (10 mg total) by mouth daily.  Dispense: 90 tablet; Refill: 1 -     Cefdinir; Take 1 capsule (300 mg total) by mouth 2 (two) times daily.  Dispense: 14 capsule; Refill: 0 -     Omeprazole; Take 1 capsule (40 mg total) by mouth daily.  Dispense: 90 capsule; Refill: 3 -     Rosuvastatin Calcium; Take 1 tablet (10 mg total) by mouth daily.  Dispense: 90 tablet; Refill: 3     Dale [place], MD

## 2022-12-14 NOTE — Assessment & Plan Note (Signed)
Did not tolerate repatha.  On crestor and zetia.  Follow lipid panel.  Continue low cholesterol diet and exercise.   

## 2022-12-15 ENCOUNTER — Encounter: Payer: Self-pay | Admitting: Internal Medicine

## 2022-12-15 NOTE — Assessment & Plan Note (Signed)
EGD: 12/2021 - stomach biopsy - benign changes c/w fundic gland polyp  

## 2022-12-15 NOTE — Assessment & Plan Note (Signed)
Continue crestor 

## 2022-12-15 NOTE — Assessment & Plan Note (Signed)
Has f/u planned with Dr Andee Poles.

## 2022-12-15 NOTE — Assessment & Plan Note (Signed)
Sinusitis/URI.  Symptoms as outlined.  Saline nasal spray.  Start [place].  Continue mucinex DM.  Omnicef as directed.  Follow.

## 2022-12-15 NOTE — Assessment & Plan Note (Signed)
Carotid ultrasound - right: 40-59% and left:  1-39%.  Continue statin therapy.  Carotid ultrasound (04/2021) - recommended f/u carotid ultrasound every other year.  

## 2022-12-15 NOTE — Assessment & Plan Note (Signed)
Overall appears to be handling things relatively well.  Has buspar to take prn.  Follow.  

## 2022-12-15 NOTE — Assessment & Plan Note (Signed)
Low carb diet and exercise.  Follow met b and a1c.   

## 2022-12-15 NOTE — Assessment & Plan Note (Signed)
Being followed by cardiology.  Continue risk factor modification.  Continue crestor.  

## 2022-12-15 NOTE — Assessment & Plan Note (Signed)
On thyroid replacement.  Follow tsh.  

## 2022-12-15 NOTE — Assessment & Plan Note (Signed)
Colonoscopy 01/2018.  Per pt, f/u colonoscopy recommended in 5 years. Plans to f/u with GI

## 2022-12-17 ENCOUNTER — Encounter: Payer: Self-pay | Admitting: Internal Medicine

## 2022-12-19 ENCOUNTER — Encounter: Payer: Self-pay | Admitting: Internal Medicine

## 2022-12-19 NOTE — Telephone Encounter (Signed)
Agree with holding omnicef and continuing probiotics.  Call with update to confirm complete resolution of symptoms

## 2022-12-19 NOTE — Telephone Encounter (Signed)
Patient sent my chart message to let me know that she is unable to tolerate antibiotic- caused severe diarrhea. She has not taken any today and diarrhea has improved some. Her sinus symptoms are better. She is still using mucinex and sinus rinse. Advised patient to remain off of abx and continue her probiotic. She is making sure she is drinking plenty of fluids. No other acute symptoms. Just wanted you to be aware that she could not finish the omnicef for the sinus infection you were treating.

## 2022-12-19 NOTE — Telephone Encounter (Signed)
See more recent message

## 2022-12-20 NOTE — Telephone Encounter (Signed)
I am glad she is feeling better.  Remain off abx.  I would add mucinex.  Keep [place] posted. Need to confirm diarrhea completely resolves and confirm eating.

## 2022-12-21 ENCOUNTER — Encounter: Payer: Self-pay | Admitting: Internal Medicine

## 2023-01-24 ENCOUNTER — Telehealth: Payer: Self-pay | Admitting: Internal Medicine

## 2023-01-24 NOTE — Telephone Encounter (Signed)
Patient is asking if you want her to continue to take the ezetimibe (ZETIA) 10 MG tablet and if so she would like it sent to [facility].

## 2023-01-25 ENCOUNTER — Other Ambulatory Visit: Payer: Self-pay

## 2023-01-25 MED ORDER — EZETIMIBE 10 MG PO TABS: 10.0000 mg | ORAL_TABLET | Freq: Every day | ORAL | 3 refills | Status: DC

## 2023-01-25 NOTE — Telephone Encounter (Signed)
See my chart message

## 2023-02-12 HISTORY — PX: OTHER SURGICAL HISTORY: SHX169

## 2023-02-20 DIAGNOSIS — H2511 Age-related nuclear cataract, right eye: Secondary | ICD-10-CM | POA: Diagnosis not present

## 2023-02-21 DIAGNOSIS — H2512 Age-related nuclear cataract, left eye: Secondary | ICD-10-CM | POA: Diagnosis not present

## 2023-02-27 ENCOUNTER — Encounter: Payer: Self-pay | Admitting: Internal Medicine

## 2023-02-27 NOTE — Telephone Encounter (Signed)
Noted.  Let us know if persistent problems.

## 2023-02-28 ENCOUNTER — Other Ambulatory Visit: Payer: Self-pay

## 2023-03-01 DIAGNOSIS — H2511 Age-related nuclear cataract, right eye: Secondary | ICD-10-CM | POA: Diagnosis not present

## 2023-03-05 ENCOUNTER — Encounter: Payer: Self-pay | Admitting: Internal Medicine

## 2023-03-06 DIAGNOSIS — H2512 Age-related nuclear cataract, left eye: Secondary | ICD-10-CM | POA: Diagnosis not present

## 2023-03-06 NOTE — Telephone Encounter (Signed)
As long as not acutely ill and not on steroids/prednisone - should be ok to get her flu vaccine and [facility] here - whichever she prefers.  She can confirm with ophthalmology from the surgery standpoint no other recommendations, but should be ok.

## 2023-03-15 DIAGNOSIS — H2512 Age-related nuclear cataract, left eye: Secondary | ICD-10-CM | POA: Diagnosis not present

## 2023-03-18 ENCOUNTER — Encounter: Payer: Self-pay | Admitting: Internal Medicine

## 2023-03-20 DIAGNOSIS — R591 Generalized enlarged lymph nodes: Secondary | ICD-10-CM | POA: Diagnosis not present

## 2023-03-20 DIAGNOSIS — H6123 Impacted cerumen, bilateral: Secondary | ICD-10-CM | POA: Diagnosis not present

## 2023-03-20 DIAGNOSIS — H903 Sensorineural hearing loss, bilateral: Secondary | ICD-10-CM | POA: Diagnosis not present

## 2023-04-03 ENCOUNTER — Encounter: Payer: Self-pay | Admitting: Internal Medicine

## 2023-04-03 ENCOUNTER — Telehealth (INDEPENDENT_AMBULATORY_CARE_PROVIDER_SITE_OTHER): Payer: Medicare HMO | Admitting: Internal Medicine

## 2023-04-03 VITALS — Ht 63.5 in | Wt 137.0 lb

## 2023-04-03 DIAGNOSIS — E039 Hypothyroidism, unspecified: Secondary | ICD-10-CM

## 2023-04-03 DIAGNOSIS — J0110 Acute frontal sinusitis, unspecified: Secondary | ICD-10-CM | POA: Diagnosis not present

## 2023-04-03 MED ORDER — AMOXICILLIN-POT CLAVULANATE 875-125 MG PO TABS: 1.0000 | ORAL_TABLET | Freq: Two times a day (BID) | ORAL | 0 refills | Status: DC

## 2023-04-03 NOTE — Assessment & Plan Note (Signed)
On thyroid replacement.  Follow tsh.  

## 2023-04-03 NOTE — Progress Notes (Signed)
Patient ID: [name], female   DOB: 09/02/1945, 77 y.o.   MRN: [id number]   Virtual Visit via video Note  I connected with Hillard Danker by a video enabled telemedicine application and verified that I am speaking with the correct person using two identifiers. Location patient: home Location provider: work  Persons participating in the virtual visit: patient, provider  The limitations, risks, security and privacy concerns of performing an evaluation and management service by video and the availability of in person appointments have been discussed.  It has also been discussed with the patient that there may be a patient responsible charge related to this service. The patient expressed understanding and agreed to proceed.   Reason for visit: work in appt.   HPI: Work in - concern regarding sinus infection.  Reports that for the last couple of days,she noticed increased sinus pressure and nasal congestion.  Colored (yellow) mucus.  Scratchy throat yesterday.  Not as scratchy today.  Some chest congestion.  Some cough - productive today of minimal yellow mucus.  No coughing fits.  No increased sob.  No vomiting or diarrhea.  Using saline nasal spray. Took mucinex x 1.  Feels similar to sinus infection.  Discussed testing for covid. Denies exposure.     ROS: See pertinent positives and negatives per HPI.  Past Medical History:  Diagnosis Date   Arthritis    Environmental allergies    Esophagitis    Barrets, followed by Dr Elsie Amis   GERD (gastroesophageal reflux disease)    Hypercholesterolemia    Hypothyroidism     Past Surgical History:  Procedure Laterality Date   ANKLE SURGERY  1967   KNEE ARTHROSCOPY  12/03/07   left   KNEE ARTHROSCOPY  06/25/08   left   NASAL SINUS SURGERY  12/06   REPLACEMENT TOTAL KNEE  01/04/09   left   RHINOPLASTY  1986   VAGINAL HYSTERECTOMY  1980   ovaries left in place   WRIST SURGERY  1970   cyst removal    Family History  Problem Relation  Age of Onset   Pulmonary disease Mother    Heart disease Father        heart attack   Diabetes Father    Multiple myeloma Brother    Breast cancer Maternal Aunt    Breast cancer Maternal Aunt    Prostate cancer Maternal Uncle    Hyperlipidemia Paternal Grandmother    Diabetes Paternal Grandmother    Stroke Paternal Grandmother    Hyperlipidemia Paternal Grandfather    Diabetes Paternal Grandfather    Pancreatic cancer Cousin     SOCIAL HX: reviewed.    Current Outpatient Medications:    amoxicillin-clavulanate (AUGMENTIN) 875-125 MG tablet, Take 1 tablet by mouth 2 (two) times daily., Disp: 20 tablet, Rfl: 0   acetaminophen (TYLENOL) 500 MG tablet, Take 500 mg by mouth every 6 (six) hours as needed., Disp: , Rfl:    Albuterol-Budesonide (AIRSUPRA) 90-80 MCG/ACT AERO, Inhale 2 puffs into the lungs every 4 (four) hours as needed., Disp: 10.7 g, Rfl: 0   Ascorbic Acid (VITAMIN C) 500 MG CAPS, Take 500 mg by mouth daily., Disp: , Rfl:    aspirin EC 81 MG tablet, Take 1 tablet (81 mg total) by mouth daily. Swallow whole., Disp: 90 tablet, Rfl: 3   B Complex Vitamins (VITAMIN B-COMPLEX) TABS, Take 1 tablet by mouth daily., Disp: , Rfl:    busPIRone (BUSPAR) 5 MG tablet, Take 5 mg by mouth  daily as needed., Disp: , Rfl:    chlorhexidine (PERIDEX) 0.12 % solution, SMARTSIG:By Mouth, Disp: , Rfl:    Cholecalciferol (VITAMIN D PO), Take 10,000 Units by mouth daily. , Disp: , Rfl:    Coenzyme Q10 (COQ-10 PO), Take 120 mg by mouth daily., Disp: , Rfl:    COLLAGEN PO, Take 11 mg by mouth daily., Disp: , Rfl:    diclofenac sodium (VOLTAREN) 1 % GEL, Apply 1 application topically as needed., Disp: , Rfl:    ELDERBERRY PO, Take 1 capsule by mouth daily. Takes one gummy daily, Disp: , Rfl:    fluocinonide cream (LIDEX) 0.05 %, Apply 1 application topically 2 (two) times daily as needed. , Disp: , Rfl:    Hypertonic Nasal Wash (SINUS RINSE NA), Place into the nose as needed., Disp: , Rfl:     levothyroxine (SYNTHROID) 50 MCG tablet, TAKE 1 TABLET EVERY OTHER  DAY, Disp: 45 tablet, Rfl: 3   levothyroxine (SYNTHROID) 75 MCG tablet, TAKE 1 TABLET EVERY OTHER  DAY, Disp: 45 tablet, Rfl: 3   NON FORMULARY, DE 3 Omega Benefits TID, Disp: , Rfl:    Omega-3 Fatty Acids (FISH OIL PO), Take 1,200 mg by mouth daily., Disp: , Rfl:    omeprazole (PRILOSEC) 40 MG capsule, Take 1 capsule (40 mg total) by mouth daily., Disp: 90 capsule, Rfl: 3   Probiotic Product (PROBIOTIC DAILY PO), Take 1 capsule by mouth., Disp: , Rfl:    rosuvastatin (CRESTOR) 10 MG tablet, Take 1 tablet (10 mg total) by mouth daily., Disp: 90 tablet, Rfl: 3   sodium chloride irrigation 0.9 % irrigation, sodium chloride 0.9 % irrigation solution, Disp: , Rfl:    UNABLE TO FIND, Take 1 Capful by mouth daily. Mega B-50, Disp: , Rfl:    vitamin E 400 UNIT capsule, Take 400 Units by mouth daily., Disp: , Rfl:   EXAM:  VITALS per patient if applicable: 98.0, 129/63, 69, 97%  GENERAL: alert, oriented, appears well and in no acute distress  HEENT: atraumatic, conjunttiva clear, no obvious abnormalities on inspection of external nose and ears  NECK: normal movements of the head and neck  LUNGS: on inspection no signs of respiratory distress, breathing rate appears normal, no obvious gross SOB, gasping or wheezing.  No increased cough with forced expiration.   CV: no obvious cyanosis  MS: moves all visible extremities without noticeable abnormality  PSYCH/NEURO: pleasant and cooperative, no obvious depression or anxiety, speech and thought processing grossly intact  ASSESSMENT AND PLAN:  Discussed the following assessment and plan:  Problem List Items Addressed This Visit     Hypothyroidism    On thyroid replacement.  Follow tsh.       Sinusitis - Primary    Sinusitis/URI.  Symptoms as outlined.  Saline nasal spray.  Continue mucinex.  Augmentin as directed.  Taking probiotic. Delsym prn cough. Follow.  Discussed  checking home covid test.  Call with update.        Relevant Medications   amoxicillin-clavulanate (AUGMENTIN) 875-125 MG tablet    Return if symptoms worsen or fail to improve.   I discussed the assessment and treatment plan with the patient. The patient was provided an opportunity to ask questions and all were answered. The patient agreed with the plan and demonstrated an understanding of the instructions.   The patient was advised to call back or seek an in-person evaluation if the symptoms worsen or if the condition fails to improve as anticipated.  Dale [place], MD

## 2023-04-03 NOTE — Assessment & Plan Note (Signed)
Sinusitis/URI.  Symptoms as outlined.  Saline nasal spray.  Continue mucinex.  Augmentin as directed.  Taking probiotic. Delsym prn cough. Follow.  Discussed checking home covid test.  Call with update.

## 2023-04-03 NOTE — Telephone Encounter (Signed)
Pt scheduled for virtual with Dr Lorin Picket to discuss.

## 2023-04-03 NOTE — Telephone Encounter (Signed)
Patient just called and said she left a MyChart message. She said she is coming down with a cold and would like to get some medication prescribed to her. Her pharmacy [facility] use [id number] [place],  - [address], [place] [address] Phone: [phone]  Fax: [phone]  Her number is [phone].

## 2023-04-04 NOTE — Telephone Encounter (Signed)
Thanks for the update.  Keep [place] posted on how doing.

## 2023-04-05 ENCOUNTER — Encounter: Payer: Self-pay | Admitting: Internal Medicine

## 2023-04-07 ENCOUNTER — Encounter: Payer: Self-pay | Admitting: Internal Medicine

## 2023-04-17 ENCOUNTER — Encounter: Payer: Self-pay | Admitting: Internal Medicine

## 2023-04-17 ENCOUNTER — Ambulatory Visit: Payer: Medicare HMO | Admitting: Pulmonary Disease

## 2023-04-17 ENCOUNTER — Encounter: Payer: Self-pay | Admitting: Pulmonary Disease

## 2023-04-17 VITALS — BP 110/74 | HR 65 | Temp 97.1°F | Ht 63.5 in | Wt 138.6 lb

## 2023-04-17 DIAGNOSIS — J4530 Mild persistent asthma, uncomplicated: Secondary | ICD-10-CM | POA: Insufficient documentation

## 2023-04-17 DIAGNOSIS — R918 Other nonspecific abnormal finding of lung field: Secondary | ICD-10-CM | POA: Diagnosis not present

## 2023-04-17 DIAGNOSIS — J849 Interstitial pulmonary disease, unspecified: Secondary | ICD-10-CM

## 2023-04-17 DIAGNOSIS — J0181 Other acute recurrent sinusitis: Secondary | ICD-10-CM | POA: Diagnosis not present

## 2023-04-17 DIAGNOSIS — J8410 Pulmonary fibrosis, unspecified: Secondary | ICD-10-CM | POA: Insufficient documentation

## 2023-04-17 LAB — NITRIC OXIDE: Nitric Oxide: 13

## 2023-04-17 MED ORDER — TRELEGY ELLIPTA 100-62.5-25 MCG/ACT IN AEPB: 1.0000 | INHALATION_SPRAY | Freq: Every day | RESPIRATORY_TRACT | Status: DC

## 2023-04-17 NOTE — Patient Instructions (Signed)
VISIT SUMMARY:  You were seen today for a follow-up on your interstitial lung disease (ILD) and pulmonary nodules, as well as a recent sinus infection. You reported feeling generally well, with some shortness of breath and a history of sinus issues. Your recent sinus infection was treated successfully with antibiotics and saline rinses. We discussed your current symptoms and made adjustments to your inhaler medications. A follow-up scan for your pulmonary nodules is scheduled for April 2025.  YOUR PLAN:  -INTERSTITIAL LUNG DISEASE (ILD) AND PULMONARY NODULES: ILD is a group of lung disorders that cause scarring of lung tissues, and pulmonary nodules are small growths in the lungs. Your condition is stable with mild symptoms. We are starting you on a Trelegy inhaler once daily, and you should rinse your mouth after each use. You will also have an Albuterol rescue inhaler for use as needed. A repeat CT scan is scheduled for April 2025 to monitor your pulmonary nodules.  -SINUSITIS: Sinusitis is an inflammation of the sinuses, often due to infection or allergies. Your recent episode was treated with Amoxicillin and saline rinses, which improved your symptoms. Continue using saline rinses as needed for any sinus symptoms.  -GENERAL HEALTH MAINTENANCE: Your flu vaccination is up to date for the current season. We will follow up in 4-6 months to review your overall health and any new concerns.  INSTRUCTIONS:  Please start using the Trelegy inhaler once daily and rinse your mouth after each use. Let us know how you do with the inhaler so that we can call in the prescription to your pharmacy. Use the Albuterol rescue inhaler as needed. Continue saline rinses for sinus symptoms. Your follow-up CT scan for pulmonary nodules is scheduled for April 2025. We will see you again in 4-6 months for a routine check-up.

## 2023-04-17 NOTE — Progress Notes (Signed)
Subjective:    Patient ID: [name], female    DOB: 19-Aug-1945, 77 y.o.   MRN: [id number]  Patient Care Team: Dale Martinsdale, MD as PCP - General (Internal Medicine) Salena Saner, MD as Consulting Physician (Pulmonary Disease)  Chief Complaint  Patient presents with   Follow-up    No SOB, wheezing or cough.     BACKGROUND/INTERVAL: [name] is a 77 year old remote former smoker with minimal tobacco exposure in the past who presents for follow-up on the issue of exertional shortness of breath, pulmonary nodules and ill characterized ILD.  She was last seen on 21 September 2022.  This is a scheduled visit.  HPI Discussed the use of AI scribe software for clinical note transcription with the patient, who gave verbal consent to proceed.  History of Present Illness   The patient is a 77 year old former smoker with a history of mild interstitial lung disease (ILD) and pulmonary nodules. She reports feeling generally well, but experienced a recent episode of a scratchy throat and sinus infection symptoms, which started two days before her husband's shoulder surgery. Despite limited social interactions, the patient sought medical advice due to the worsening of symptoms. A COVID-19 test was conducted, which returned negative results. The patient was treated with a 10-day course of amoxicillin and saline rinses, which led to symptom improvement.  The patient also reports a history of sinus surgery and allergies, which often trigger sinus symptoms. She has been using a rescue inhaler regularly, which she found helpful, but the inhaler is now nearly empty. This was AIRSUPRA which was provided as a sample for her during last visit. The patient also reports some shortness of breath when walking fast or climbing stairs. She has a history of a severe coughing episode, which she believes was due to respiratory syncytial virus (RSV) infection, following a flu shot a few years ago. She feels the mild  fibrotic changes in her lung are sequela from RSV and this is possible. The patient is due for a follow-up scan for her pulmonary nodules and ILD in April. She is also in the process of reviewing her health insurance coverage.   Overall, she feels well and looks well.     DATA 09/13/2022 PFTs:FEV1 of 1.78 L or 91% predicted, FVC 2.74 L or 104% predicted, FEV1/FVC of 65%, no bronchodilator response. Lung volumes are normal. Diffusion capacity mildly reduced. Consistent with mild obstructive airways disease and mild diffusion defect.  09/14/2022 high-resolution chest UV:[id number] mild pulmonary fibrosis in a pattern that shows mostly, mild traction bronchiectasis and minimal subpleural bronchiolectasis.  Findings could mean probable UIP small bilateral pulmonary nodules measuring 5 mm or less in diameter.   Review of Systems A 10 point review of systems was performed and it is as noted above otherwise negative.   Patient Active Problem List   Diagnosis Date Noted   Osteoarthritis of foot 05/13/2022   CAD (coronary artery disease) 05/03/2022   History of COVID-19 02/10/2022   Pain in both feet 10/31/2021   Allergic rhinitis 10/29/2021   Joint pain 06/20/2021   TMJ arthralgia 06/20/2021   Numbness of finger 06/20/2021   Carotid artery disease (HCC) 04/21/2021   Left carotid bruit 03/03/2021   Aortic atherosclerosis (HCC) 10/21/2020   Hyperglycemia 04/12/2020   Change in bowel habits 01/16/2020   Hot flashes 09/23/2019   Sinusitis 05/21/2019   Left hip pain 02/19/2019   Axillary fullness 02/19/2019   Lymphadenopathy 10/17/2018   Neck pain 04/29/2018   Swelling  of right hand 12/24/2017   Loose stools 08/02/2015   Health care maintenance 01/29/2015   Right foot pain 02/17/2014   History of colonic polyps 05/07/2013   Stress 02/27/2013   Hypercholesteremia 03/26/2012   Hypothyroidism 03/26/2012   GERD (gastroesophageal reflux disease) 03/26/2012    Social History   Tobacco Use    Smoking status: Former    Current packs/day: 0.25    Average packs/day: 0.3 packs/day for 5.0 years (1.3 ttl pk-yrs)    Types: Cigarettes   Smokeless tobacco: Never   Tobacco comments:    Smoked a pack a week in her early 20's. Smoked for 5 years.  Substance Use Topics   Alcohol use: Never    Comment: rare    Allergies  Allergen Reactions   Paxlovid [Nirmatrelvir-Ritonavir] Other (See Comments)    Severe sore throat   Diclofenac Nausea Only   Ibuprofen Other (See Comments)    Stomach issues   Tramadol Nausea Only   Hydromorphone Rash   Oxycodone Other (See Comments)    constipation    Current Meds  Medication Sig   acetaminophen (TYLENOL) 500 MG tablet Take 500 mg by mouth every 6 (six) hours as needed.   Albuterol-Budesonide (AIRSUPRA) 90-80 MCG/ACT AERO Inhale 2 puffs into the lungs every 4 (four) hours as needed.   Ascorbic Acid (VITAMIN C) 500 MG CAPS Take 500 mg by mouth daily.   aspirin EC 81 MG tablet Take 1 tablet (81 mg total) by mouth daily. Swallow whole.   B Complex Vitamins (VITAMIN B-COMPLEX) TABS Take 1 tablet by mouth daily.   busPIRone (BUSPAR) 5 MG tablet Take 5 mg by mouth daily as needed.   chlorhexidine (PERIDEX) 0.12 % solution SMARTSIG:By Mouth   Cholecalciferol (VITAMIN D PO) Take 10,000 Units by mouth daily.    Coenzyme Q10 (COQ-10 PO) Take 120 mg by mouth daily.   COLLAGEN PO Take 11 mg by mouth daily.   diclofenac sodium (VOLTAREN) 1 % GEL Apply 1 application topically as needed.   ELDERBERRY PO Take 1 capsule by mouth daily. Takes one gummy daily   fluocinonide cream (LIDEX) 0.05 % Apply 1 application topically 2 (two) times daily as needed.    Fluticasone-Umeclidin-Vilant (TRELEGY ELLIPTA) 100-62.5-25 MCG/ACT AEPB Inhale 1 puff into the lungs daily.   Hypertonic Nasal Wash (SINUS RINSE NA) Place into the nose as needed.   levothyroxine (SYNTHROID) 50 MCG tablet TAKE 1 TABLET EVERY OTHER  DAY   levothyroxine (SYNTHROID) 75 MCG tablet TAKE 1  TABLET EVERY OTHER  DAY   NON FORMULARY DE 3 Omega Benefits TID   Omega-3 Fatty Acids (FISH OIL PO) Take 1,200 mg by mouth daily.   omeprazole (PRILOSEC) 40 MG capsule Take 1 capsule (40 mg total) by mouth daily.   Probiotic Product (PROBIOTIC DAILY PO) Take 1 capsule by mouth.   rosuvastatin (CRESTOR) 10 MG tablet Take 1 tablet (10 mg total) by mouth daily.   sodium chloride irrigation 0.9 % irrigation sodium chloride 0.9 % irrigation solution   UNABLE TO FIND Take 1 Capful by mouth daily. Mega B-50   vitamin E 400 UNIT capsule Take 400 Units by mouth daily.    Immunization History  Administered Date(s) Administered   Fluad Quad(high Dose 65+) 02/19/2019, 04/07/2020, 02/22/2021   Influenza Split 03/26/2012   Influenza, High Dose Seasonal PF 03/07/2017, 03/09/2018   Influenza,inj,Quad PF,6+ Mos 02/25/2013, 02/14/2014, 03/26/2015, 02/02/2016   Influenza,inj,quad, With Preservative 03/13/2017, 03/13/2018   Influenza-Unspecified 03/18/2023   PFIZER(Purple Top)SARS-COV-2 Vaccination 10/18/2019,  11/12/2019   Pneumococcal Conjugate-13 04/22/2015   Pneumococcal Polysaccharide-23 01/24/2011, 06/14/2015   Tdap 11/17/2015        Objective:     BP 110/74 (BP Location: Right Arm, Cuff Size: Normal)   Pulse 65   Temp (!) 97.1 F (36.2 C)   Ht 5' 3.5" (1.613 m)   Wt 138 lb 9.6 oz (62.9 kg)   SpO2 97%   BMI 24.17 kg/m   SpO2: 97 % O2 Device: None (Room air)  GENERAL: Well-developed, well-nourished woman, no acute distress, fully ambulatory, no conversational dyspnea. HEAD: Normocephalic, atraumatic.  EYES: Pupils equal, round, reactive to light.  No scleral icterus.  MOUTH: Dentition intact, oral mucosa moist. NECK: Supple. No thyromegaly. Trachea midline. No JVD.  No adenopathy. PULMONARY: Good air entry bilaterally.  Coarse, otherwise, no adventitious sounds. CARDIOVASCULAR: S1 and S2. Regular rate and rhythm.  No rubs, murmurs or gallops heard. ABDOMEN: Benign. MUSCULOSKELETAL:  No joint deformity, no clubbing, no edema.  NEUROLOGIC: No overt focal deficit, no gait disturbance, speech is fluent. SKIN: Intact,warm,dry. PSYCH: Mood and behavior normal.  Lab Results  Component Value Date   NITRICOXIDE 13 04/17/2023    Assessment & Plan:     ICD-10-CM   1. Mild persistent asthmatic bronchitis without complication  J45.30    Trial of Trelegy Ellipta 100 As needed albuterol    2. ILD (interstitial lung disease) (HCC)  J84.9 CT CHEST HIGH RESOLUTION    Nitric oxide   Due for CT chest high-res on April 2025    3. Multiple lung nodules  R91.8 CT CHEST HIGH RESOLUTION    Nitric oxide   CT high-res on April 2025    4. Other acute recurrent sinusitis  J01.81    Recent episode now resolved      Orders Placed This Encounter  Procedures   CT CHEST HIGH RESOLUTION    To be done in April    Standing Status:   Future    Standing Expiration Date:   04/16/2024    Order Specific Question:   Preferred imaging location?    Answer:   [facility]   Nitric oxide    Meds ordered this encounter  Medications   Fluticasone-Umeclidin-Vilant (TRELEGY ELLIPTA) 100-62.5-25 MCG/ACT AEPB    Sig: Inhale 1 puff into the lungs daily.    Dispense:  14 each    Order Specific Question:   Lot Number?    Answer:   [id number]    Order Specific Question:   Expiration Date?    Answer:   09/11/2024    Order Specific Question:   Quantity    Answer:   1   Discussion:    Interstitial Lung Disease (ILD) and Pulmonary Nodules Stable with mild symptoms. Recent sinus infection treated with Amoxicillin and saline rinse. No current wheezing. Exertional shortness of breath noted. -Start Trelegy inhaler once daily, rinse mouth after use. -Provide Albuterol rescue inhaler for as-needed use. -Order repeat CT scan for April 2025 to monitor pulmonary nodules.  Sinusitis Recent episode treated with Amoxicillin and saline rinse. -Continue saline rinse as needed for sinus symptoms.  General  Health Maintenance -Flu vaccination confirmed for current season. -Follow-up in 4-6 months.     Patient is to contact us prior to follow-up should any new difficulties arise. Gailen Shelter, MD Advanced Bronchoscopy P[facility]    *This note was generated using voice recognition software/Dragon and/or AI transcription program.  Despite best efforts to proofread, errors can occur which can change  the meaning. Any transcriptional errors that result from this process are unintentional and may not be fully corrected at the time of dictation.

## 2023-04-18 ENCOUNTER — Other Ambulatory Visit (INDEPENDENT_AMBULATORY_CARE_PROVIDER_SITE_OTHER): Payer: Medicare HMO

## 2023-04-18 DIAGNOSIS — E7800 Pure hypercholesterolemia, unspecified: Secondary | ICD-10-CM | POA: Diagnosis not present

## 2023-04-18 DIAGNOSIS — R739 Hyperglycemia, unspecified: Secondary | ICD-10-CM | POA: Diagnosis not present

## 2023-04-18 LAB — BASIC METABOLIC PANEL WITH GFR
BUN: 15 mg/dL (ref 6–23)
CO2: 31 meq/L (ref 19–32)
Calcium: 9.9 mg/dL (ref 8.4–10.5)
Chloride: 104 meq/L (ref 96–112)
Creatinine, Ser: 0.69 mg/dL (ref 0.40–1.20)
GFR: 83.31 mL/min
Glucose, Bld: 89 mg/dL (ref 70–99)
Potassium: 4.3 meq/L (ref 3.5–5.1)
Sodium: 142 meq/L (ref 135–145)

## 2023-04-18 LAB — CBC WITH DIFFERENTIAL/PLATELET
Basophils Absolute: 0 10*3/uL (ref 0.0–0.1)
Basophils Relative: 1 % (ref 0.0–3.0)
Eosinophils Absolute: 0.1 10*3/uL (ref 0.0–0.7)
Eosinophils Relative: 3.2 % (ref 0.0–5.0)
HCT: 39.3 % (ref 36.0–46.0)
Hemoglobin: 13 g/dL (ref 12.0–15.0)
Lymphocytes Relative: 36.6 % (ref 12.0–46.0)
Lymphs Abs: 1.6 10*3/uL (ref 0.7–4.0)
MCHC: 33.1 g/dL (ref 30.0–36.0)
MCV: 96.4 fl (ref 78.0–100.0)
Monocytes Absolute: 0.4 10*3/uL (ref 0.1–1.0)
Monocytes Relative: 9.3 % (ref 3.0–12.0)
Neutro Abs: 2.1 10*3/uL (ref 1.4–7.7)
Neutrophils Relative %: 49.9 % (ref 43.0–77.0)
Platelets: 254 10*3/uL (ref 150.0–400.0)
RBC: 4.08 Mil/uL (ref 3.87–5.11)
RDW: 12.6 % (ref 11.5–15.5)
WBC: 4.2 10*3/uL (ref 4.0–10.5)

## 2023-04-18 LAB — LIPID PANEL
Cholesterol: 189 mg/dL (ref 0–200)
HDL: 65.1 mg/dL
LDL Cholesterol: 108 mg/dL — ABNORMAL HIGH (ref 0–99)
NonHDL: 124.23
Total CHOL/HDL Ratio: 3
Triglycerides: 81 mg/dL (ref 0.0–149.0)
VLDL: 16.2 mg/dL (ref 0.0–40.0)

## 2023-04-18 LAB — HEPATIC FUNCTION PANEL
ALT: 17 U/L (ref 0–35)
AST: 21 U/L (ref 0–37)
Albumin: 4.5 g/dL (ref 3.5–5.2)
Alkaline Phosphatase: 64 U/L (ref 39–117)
Bilirubin, Direct: 0.1 mg/dL (ref 0.0–0.3)
Total Bilirubin: 0.6 mg/dL (ref 0.2–1.2)
Total Protein: 7 g/dL (ref 6.0–8.3)

## 2023-04-18 LAB — HEMOGLOBIN A1C: Hgb A1c MFr Bld: 5.5 % (ref 4.6–6.5)

## 2023-04-19 ENCOUNTER — Ambulatory Visit
Admission: RE | Admit: 2023-04-19 | Discharge: 2023-04-19 | Disposition: A | Discharge status: Home / Self Care | Payer: Medicare HMO | Source: Ambulatory Visit | Attending: Internal Medicine | Admitting: Internal Medicine

## 2023-04-19 DIAGNOSIS — Z1231 Encounter for screening mammogram for malignant neoplasm of breast: Secondary | ICD-10-CM | POA: Diagnosis present

## 2023-04-20 ENCOUNTER — Ambulatory Visit (INDEPENDENT_AMBULATORY_CARE_PROVIDER_SITE_OTHER): Payer: Medicare HMO | Admitting: Internal Medicine

## 2023-04-20 VITALS — BP 122/70 | HR 73 | Temp 97.9°F | Resp 16 | Ht 63.0 in | Wt 138.8 lb

## 2023-04-20 DIAGNOSIS — I779 Disorder of arteries and arterioles, unspecified: Secondary | ICD-10-CM | POA: Diagnosis not present

## 2023-04-20 DIAGNOSIS — R739 Hyperglycemia, unspecified: Secondary | ICD-10-CM

## 2023-04-20 DIAGNOSIS — I700 Atherosclerosis of aorta: Secondary | ICD-10-CM | POA: Diagnosis not present

## 2023-04-20 DIAGNOSIS — E7800 Pure hypercholesterolemia, unspecified: Secondary | ICD-10-CM

## 2023-04-20 DIAGNOSIS — E039 Hypothyroidism, unspecified: Secondary | ICD-10-CM | POA: Diagnosis not present

## 2023-04-20 DIAGNOSIS — J4530 Mild persistent asthma, uncomplicated: Secondary | ICD-10-CM

## 2023-04-20 DIAGNOSIS — K2100 Gastro-esophageal reflux disease with esophagitis, without bleeding: Secondary | ICD-10-CM | POA: Diagnosis not present

## 2023-04-20 DIAGNOSIS — F439 Reaction to severe stress, unspecified: Secondary | ICD-10-CM

## 2023-04-20 DIAGNOSIS — J849 Interstitial pulmonary disease, unspecified: Secondary | ICD-10-CM

## 2023-04-20 DIAGNOSIS — I2510 Atherosclerotic heart disease of native coronary artery without angina pectoris: Secondary | ICD-10-CM | POA: Diagnosis not present

## 2023-04-20 DIAGNOSIS — L989 Disorder of the skin and subcutaneous tissue, unspecified: Secondary | ICD-10-CM

## 2023-04-20 MED ORDER — BUSPIRONE HCL 5 MG PO TABS: 5.0000 mg | ORAL_TABLET | Freq: Every day | ORAL | 0 refills | Status: DC | PRN

## 2023-04-20 NOTE — Progress Notes (Addendum)
Subjective:    Patient ID: [name], female    DOB: [birth year], 77 y.o.   MRN: [id number]  Patient here for  Chief Complaint  Patient presents with   Medical Management of Chronic Issues    HPI Here to follow up regarding hypercholesterolemia.  Had problems with repatha. Back on crestor.  Not taking zetia. Did not tolerate. Also taking 81mg  aspirin. Stays active. Saw Dr Jayme Cloud 09/21/22 - PFTs show FEV1 of 1.78 L or 91% predicted, FVC 2.74 L or 104% predicted, FEV1/FVC of 65%, no bronchodilator response. Lung volumes are normal. Diffusion capacity mildly reduced. Consistent with mild obstructive airways disease and mild diffusion defect.CT chest high-resolution showed unchanged mild pulmonary fibrosis in a pattern that shows mostly, mild traction bronchiectasis and minimal subpleural bronchiolectasis.  Recommended AirSupra. Had f/u 04/17/23 (Dr Jayme Cloud).  Recommended f/u CT in 09/2023.  Also recommended starting trelegy. Breathing overall stable. No increased acid reflux reported. Due f/u carotid ultrasound.  Last 04/2021.    Past Medical History:  Diagnosis Date   Arthritis    Environmental allergies    Esophagitis    Barrets, followed by Dr Elsie Amis   GERD (gastroesophageal reflux disease)    Hypercholesterolemia    Hypothyroidism    Past Surgical History:  Procedure Laterality Date   ANKLE SURGERY  1967   KNEE ARTHROSCOPY  12/03/07   left   KNEE ARTHROSCOPY  06/25/08   left   NASAL SINUS SURGERY  12/06   REPLACEMENT TOTAL KNEE  01/04/09   left   RHINOPLASTY  1986   VAGINAL HYSTERECTOMY  1980   ovaries left in place   WRIST SURGERY  1970   cyst removal   Family History  Problem Relation Age of Onset   Pulmonary disease Mother    Heart disease Father        heart attack   Diabetes Father    Multiple myeloma Brother    Breast cancer Maternal Aunt    Breast cancer Maternal Aunt    Prostate cancer Maternal Uncle    Hyperlipidemia Paternal Grandmother    Diabetes  Paternal Grandmother    Stroke Paternal Grandmother    Hyperlipidemia Paternal Grandfather    Diabetes Paternal Grandfather    Pancreatic cancer Cousin    Social History   Socioeconomic History   Marital status: Married    Spouse name: Not on file   Number of children: Not on file   Years of education: Not on file   Highest education level: Not on file  Occupational History   Not on file  Tobacco Use   Smoking status: Former    Current packs/day: 0.25    Average packs/day: 0.3 packs/day for 5.0 years (1.3 ttl pk-yrs)    Types: Cigarettes   Smokeless tobacco: Never   Tobacco comments:    Smoked a pack a week in her early 20's. Smoked for 5 years.  Vaping Use   Vaping status: Never Used  Substance and Sexual Activity   Alcohol use: Never    Comment: rare   Drug use: No   Sexual activity: Yes  Other Topics Concern   Not on file  Social History Narrative   Not on file   Social Determinants of Health   Financial Resource Strain: Low Risk  (04/18/2022)   Overall Financial Resource Strain (CARDIA)    Difficulty of Paying Living Expenses: Not hard at all  Food Insecurity: No Food Insecurity (04/18/2022)   Hunger Vital Sign  Worried About Programme researcher, broadcasting/film/video in the Last Year: Never true    Ran Out of Food in the Last Year: Never true  Transportation Needs: No Transportation Needs (04/18/2022)   PRAPARE - Administrator, Civil Service (Medical): No    Lack of Transportation (Non-Medical): No  Physical Activity: Insufficiently Active (02/22/2021)   Exercise Vital Sign    Days of Exercise per Week: 2 days    Minutes of Exercise per Session: 60 min  Stress: No Stress Concern Present (04/18/2022)   [employer] of Occupational Health - Occupational Stress Questionnaire    Feeling of Stress : Not at all  Social Connections: Unknown (04/18/2022)   Social Connection and Isolation Panel [NHANES]    Frequency of Communication with Friends and Family: More than three  times a week    Frequency of Social Gatherings with Friends and Family: Not on file    Attends Religious Services: Not on file    Active Member of Clubs or Organizations: Not on file    Attends Banker Meetings: Not on file    Marital Status: Married     Review of Systems  Constitutional:  Negative for appetite change and unexpected weight change.  HENT:  Negative for congestion and sinus pressure.   Respiratory:  Negative for chest tightness and shortness of breath.        No increased cough.   Cardiovascular:  Negative for chest pain and palpitations.  Gastrointestinal:  Negative for abdominal pain, diarrhea, nausea and vomiting.  Genitourinary:  Negative for difficulty urinating and dysuria.  Musculoskeletal:  Negative for joint swelling and myalgias.  Skin:  Negative for color change and rash.       Skin lesion - right scalp  Neurological:  Negative for dizziness and headaches.  Psychiatric/Behavioral:  Negative for agitation and dysphoric mood.        Objective:     BP 122/70   Pulse 73   Temp 97.9 F (36.6 C)   Resp 16   Ht 5\' 3"  (1.6 m)   Wt 138 lb 12.8 oz (63 kg)   SpO2 98%   BMI 24.59 kg/m  Wt Readings from Last 3 Encounters:  04/20/23 138 lb 12.8 oz (63 kg)  04/17/23 138 lb 9.6 oz (62.9 kg)  04/03/23 137 lb (62.1 kg)    Physical Exam Vitals reviewed.  Constitutional:      General: She is not in acute distress.    Appearance: Normal appearance.  HENT:     Head: Normocephalic and atraumatic.     Right Ear: External ear normal.     Left Ear: External ear normal.  Eyes:     General: No scleral icterus.       Right eye: No discharge.        Left eye: No discharge.     Conjunctiva/sclera: Conjunctivae normal.  Neck:     Thyroid: No thyromegaly.  Cardiovascular:     Rate and Rhythm: Normal rate and regular rhythm.  Pulmonary:     Effort: No respiratory distress.     Breath sounds: Normal breath sounds. No wheezing.  Abdominal:      General: Bowel sounds are normal.     Palpations: Abdomen is soft.     Tenderness: There is no abdominal tenderness.  Musculoskeletal:        General: No swelling or tenderness.     Cervical back: Neck supple. No tenderness.  Lymphadenopathy:     Cervical:  No cervical adenopathy.  Skin:    Findings: No erythema or rash.  Neurological:     Mental Status: She is alert.  Psychiatric:        Mood and Affect: Mood normal.        Behavior: Behavior normal.      Outpatient Encounter Medications as of 04/20/2023  Medication Sig   acetaminophen (TYLENOL) 500 MG tablet Take 500 mg by mouth every 6 (six) hours as needed.   Albuterol-Budesonide (AIRSUPRA) 90-80 MCG/ACT AERO Inhale 2 puffs into the lungs every 4 (four) hours as needed.   Ascorbic Acid (VITAMIN C) 500 MG CAPS Take 500 mg by mouth daily.   aspirin EC 81 MG tablet Take 1 tablet (81 mg total) by mouth daily. Swallow whole.   B Complex Vitamins (VITAMIN B-COMPLEX) TABS Take 1 tablet by mouth daily.   busPIRone (BUSPAR) 5 MG tablet Take 1 tablet (5 mg total) by mouth daily as needed.   chlorhexidine (PERIDEX) 0.12 % solution SMARTSIG:By Mouth   Cholecalciferol (VITAMIN D PO) Take 10,000 Units by mouth daily.    Coenzyme Q10 (COQ-10 PO) Take 120 mg by mouth daily.   COLLAGEN PO Take 11 mg by mouth daily.   diclofenac sodium (VOLTAREN) 1 % GEL Apply 1 application topically as needed.   ELDERBERRY PO Take 1 capsule by mouth daily. Takes one gummy daily   fluocinonide cream (LIDEX) 0.05 % Apply 1 application topically 2 (two) times daily as needed.    Fluticasone-Umeclidin-Vilant (TRELEGY ELLIPTA) 100-62.5-25 MCG/ACT AEPB Inhale 1 puff into the lungs daily.   Hypertonic Nasal Wash (SINUS RINSE NA) Place into the nose as needed.   levothyroxine (SYNTHROID) 50 MCG tablet TAKE 1 TABLET EVERY OTHER  DAY   levothyroxine (SYNTHROID) 75 MCG tablet TAKE 1 TABLET EVERY OTHER  DAY   NON FORMULARY DE 3 Omega Benefits TID   Omega-3 Fatty Acids  (FISH OIL PO) Take 1,200 mg by mouth daily.   omeprazole (PRILOSEC) 40 MG capsule Take 1 capsule (40 mg total) by mouth daily.   Probiotic Product (PROBIOTIC DAILY PO) Take 1 capsule by mouth.   rosuvastatin (CRESTOR) 10 MG tablet Take 1 tablet (10 mg total) by mouth daily.   sodium chloride irrigation 0.9 % irrigation sodium chloride 0.9 % irrigation solution   UNABLE TO FIND Take 1 Capful by mouth daily. Mega B-50   vitamin E 400 UNIT capsule Take 400 Units by mouth daily.   [DISCONTINUED] amoxicillin-clavulanate (AUGMENTIN) 875-125 MG tablet Take 1 tablet by mouth 2 (two) times daily. (Patient not taking: Reported on 04/17/2023)   [DISCONTINUED] busPIRone (BUSPAR) 5 MG tablet Take 5 mg by mouth daily as needed.   No facility-administered encounter medications on file as of 04/20/2023.     Lab Results  Component Value Date   WBC 4.2 04/18/2023   HGB 13.0 04/18/2023   HCT 39.3 04/18/2023   PLT 254.0 04/18/2023   GLUCOSE 89 04/18/2023   CHOL 189 04/18/2023   TRIG 81.0 04/18/2023   HDL 65.10 04/18/2023   LDLDIRECT 123.4 02/27/2013   LDLCALC 108 (H) 04/18/2023   ALT 17 04/18/2023   AST 21 04/18/2023   NA 142 04/18/2023   K 4.3 04/18/2023   CL 104 04/18/2023   CREATININE 0.69 04/18/2023   BUN 15 04/18/2023   CO2 31 04/18/2023   TSH 3.32 12/08/2022   HGBA1C 5.5 04/18/2023       Assessment & Plan:  Hypercholesteremia Assessment & Plan: Did not tolerate repatha.  On  crestor.  Not taking zetia. Did not tolerate.  Follow lipid panel.  Continue low cholesterol diet and exercise.   Lab Results  Component Value Date   CHOL 189 04/18/2023   HDL 65.10 04/18/2023   LDLCALC 108 (H) 04/18/2023   LDLDIRECT 123.4 02/27/2013   TRIG 81.0 04/18/2023   CHOLHDL 3 04/18/2023     Orders: -     Lipid panel; Future -     Hepatic function panel; Future -     Basic metabolic panel; Future  Hyperglycemia Assessment & Plan: Low carb diet and exercise.  Follow met b and a1c.   Orders: -      Hemoglobin A1c; Future  Stress Assessment & Plan: Increased stress.  Discussed.  Has taken buspar previously. Discussed restarting buspar - in the evening.   Follow.    ILD (interstitial lung disease) (HCC) Assessment & Plan: F/u pulmonary (04/2023) - high resolution CT due 09/2023.  F/u nodules.    Hypothyroidism, unspecified type Assessment & Plan: On thyroid replacement.  Follow tsh.    Gastroesophageal reflux disease with esophagitis without hemorrhage Assessment & Plan: EGD: 12/2021 - stomach biopsy - benign changes c/w fundic gland polyp. No increased acid reflux.  No omeprazole.    Bilateral carotid artery disease, unspecified type (HCC) Assessment & Plan: Carotid ultrasound - right: 40-59% and left:  1-39%.  Continue statin therapy.  Carotid ultrasound (04/2021) - recommended f/u carotid ultrasound every other year. Due f/u.    Coronary artery disease involving native coronary artery of native heart without angina pectoris Assessment & Plan: Being followed by cardiology.  Continue risk factor modification.  Continue crestor.    Aortic atherosclerosis (HCC) Assessment & Plan: Continue crestor.    AB (asthmatic bronchitis), mild persistent, uncomplicated Assessment & Plan: Had recent f/u with Dr Jayme Cloud.  Recommended to start trelegy.     Scalp lesion Assessment & Plan: Persistent lesion.  She will contact her dermatologist for f/u.    Other orders -     busPIRone HCl; Take 1 tablet (5 mg total) by mouth daily as needed.  Dispense: 30 tablet; Refill: 0     Dale [place], MD

## 2023-04-22 ENCOUNTER — Telehealth: Payer: Self-pay | Admitting: Internal Medicine

## 2023-04-22 ENCOUNTER — Encounter: Payer: Self-pay | Admitting: Internal Medicine

## 2023-04-22 DIAGNOSIS — L989 Disorder of the skin and subcutaneous tissue, unspecified: Secondary | ICD-10-CM | POA: Insufficient documentation

## 2023-04-22 NOTE — Assessment & Plan Note (Signed)
Continue crestor 

## 2023-04-22 NOTE — Assessment & Plan Note (Signed)
Persistent lesion.  She will contact her dermatologist for f/u.

## 2023-04-22 NOTE — Assessment & Plan Note (Signed)
Did not tolerate repatha.  On crestor.  Not taking zetia. Did not tolerate.  Follow lipid panel.  Continue low cholesterol diet and exercise.   Lab Results  Component Value Date   CHOL 189 04/18/2023   HDL 65.10 04/18/2023   LDLCALC 108 (H) 04/18/2023   LDLDIRECT 123.4 02/27/2013   TRIG 81.0 04/18/2023   CHOLHDL 3 04/18/2023

## 2023-04-22 NOTE — Assessment & Plan Note (Signed)
EGD: 12/2021 - stomach biopsy - benign changes c/w fundic gland polyp. No increased acid reflux.  No omeprazole.

## 2023-04-22 NOTE — Assessment & Plan Note (Signed)
F/u pulmonary (04/2023) - high resolution CT due 09/2023.  F/u nodules.

## 2023-04-22 NOTE — Assessment & Plan Note (Signed)
On thyroid replacement.  Follow tsh.  

## 2023-04-22 NOTE — Assessment & Plan Note (Signed)
Low carb diet and exercise.  Follow met b and a1c.   

## 2023-04-22 NOTE — Assessment & Plan Note (Signed)
Had recent f/u with Dr Jayme Cloud.  Recommended to start trelegy.

## 2023-04-22 NOTE — Assessment & Plan Note (Signed)
Increased stress.  Discussed.  Has taken buspar previously. Discussed restarting buspar - in the evening.   Follow.

## 2023-04-22 NOTE — Assessment & Plan Note (Signed)
Being followed by cardiology.  Continue risk factor modification.  Continue crestor.  

## 2023-04-22 NOTE — Telephone Encounter (Signed)
Please notify - I did review her last carotid ultrasound.  This was done in 04/2021.  I would like to recheck carotid ultrasound as we discussed. If agreeable, let me know and I can order.

## 2023-04-22 NOTE — Assessment & Plan Note (Signed)
Carotid ultrasound - right: 40-59% and left:  1-39%.  Continue statin therapy.  Carotid ultrasound (04/2021) - recommended f/u carotid ultrasound every other year. Due f/u.

## 2023-04-24 ENCOUNTER — Encounter: Payer: Self-pay | Admitting: Pulmonary Disease

## 2023-04-24 ENCOUNTER — Ambulatory Visit (INDEPENDENT_AMBULATORY_CARE_PROVIDER_SITE_OTHER): Payer: Medicare HMO | Admitting: *Deleted

## 2023-04-24 VITALS — Ht 63.5 in | Wt 138.0 lb

## 2023-04-24 DIAGNOSIS — Z0000 Encounter for general adult medical examination without abnormal findings: Secondary | ICD-10-CM | POA: Diagnosis not present

## 2023-04-24 DIAGNOSIS — I779 Disorder of arteries and arterioles, unspecified: Secondary | ICD-10-CM

## 2023-04-24 MED ORDER — TRELEGY ELLIPTA 100-62.5-25 MCG/ACT IN AEPB: 1.0000 | INHALATION_SPRAY | Freq: Every day | RESPIRATORY_TRACT | 11 refills | Status: DC

## 2023-04-24 NOTE — Patient Instructions (Signed)
[name] , Thank you for taking time to come for your Medicare Wellness Visit. I appreciate your ongoing commitment to your health goals. Please review the following plan we discussed and let me know if I can assist you in the future.   Referrals/Orders/Follow-Ups/Clinician Recommendations: Consider updating your shingles vaccines  This is a list of the screening recommended for you and due dates:  Health Maintenance  Topic Date Due   Colon Cancer Screening  01/16/2023   COVID-19 Vaccine (3 - 2023-24 season) 05/06/2023*   Zoster (Shingles) Vaccine (1 of 2) 07/21/2023*   Mammogram  04/18/2024   Medicare Annual Wellness Visit  04/23/2024   DTaP/Tdap/Td vaccine (2 - Td or Tdap) 11/16/2025   Pneumonia Vaccine  Completed   Flu Shot  Completed   DEXA scan (bone density measurement)  Completed   Hepatitis C Screening  Completed   HPV Vaccine  Aged Out  *Topic was postponed. The date shown is not the original due date.    Advanced directives: (In Chart) A copy of your advanced directives are scanned into your chart should your provider ever need it.  Next Medicare Annual Wellness Visit scheduled for next year: Yes 04/30/24 @ 10:10

## 2023-04-24 NOTE — Progress Notes (Signed)
Subjective:   [name] is a 77 y.o. female who presents for Medicare Annual (Subsequent) preventive examination.  Visit Complete: Virtual I connected with  Garlan Fillers on 04/24/23 by a audio enabled telemedicine application and verified that I am speaking with the correct person using two identifiers.  Patient Location: Home  Provider Location: Office/Clinic  I discussed the limitations of evaluation and management by telemedicine. The patient expressed understanding and agreed to proceed.  Vital Signs: Because this visit was a virtual/telehealth visit, some criteria may be missing or patient reported. Any vitals not documented were not able to be obtained and vitals that have been documented are patient reported.   Cardiac Risk Factors include: advanced age (>59men, >67 women);dyslipidemia;Other (see comment), Risk factor comments: CAD     Objective:    Today's Vitals   04/24/23 1330  Weight: 138 lb (62.6 kg)  Height: 5' 3.5" (1.613 m)   Body mass index is 24.06 kg/m.     04/24/2023    1:51 PM 04/18/2022    3:32 PM 02/22/2021   10:53 AM 02/20/2020   11:52 AM 02/19/2019   11:53 AM 09/18/2017    9:25 AM 08/02/2016    2:24 PM  Advanced Directives  Does Patient Have a Medical Advance Directive? Yes Yes Yes Yes Yes Yes Yes  Type of Estate agent of [place];Living will Healthcare Power of [place];Living will Healthcare Power of [place];Living will Healthcare Power of [place];Living will Living will;Healthcare Power of [employer] Power of [place];Living will Living will;Healthcare Power of Attorney  Does patient want to make changes to medical advance directive? No - Patient declined No - Patient declined No - Patient declined No - Patient declined No - Patient declined No - Patient declined No - Patient declined  Copy of Healthcare Power of Attorney in Chart? Yes - validated most recent copy scanned in chart (See row information) Yes -  validated most recent copy scanned in chart (See row information) Yes - validated most recent copy scanned in chart (See row information) No - copy requested Yes - validated most recent copy scanned in chart (See row information) No - copy requested No - copy requested    Current Medications (verified) Outpatient Encounter Medications as of 04/24/2023  Medication Sig   acetaminophen (TYLENOL) 500 MG tablet Take 500 mg by mouth every 6 (six) hours as needed.   Albuterol-Budesonide (AIRSUPRA) 90-80 MCG/ACT AERO Inhale 2 puffs into the lungs every 4 (four) hours as needed.   Ascorbic Acid (VITAMIN C) 500 MG CAPS Take 500 mg by mouth daily.   aspirin EC 81 MG tablet Take 1 tablet (81 mg total) by mouth daily. Swallow whole.   B Complex Vitamins (VITAMIN B-COMPLEX) TABS Take 1 tablet by mouth daily.   busPIRone (BUSPAR) 5 MG tablet Take 1 tablet (5 mg total) by mouth daily as needed.   chlorhexidine (PERIDEX) 0.12 % solution SMARTSIG:By Mouth   Cholecalciferol (VITAMIN D PO) Take 10,000 Units by mouth daily.    Coenzyme Q10 (COQ-10 PO) Take 120 mg by mouth daily.   COLLAGEN PO Take 11 mg by mouth daily.   diclofenac sodium (VOLTAREN) 1 % GEL Apply 1 application topically as needed.   fluocinonide cream (LIDEX) 0.05 % Apply 1 application topically 2 (two) times daily as needed.    Fluticasone-Umeclidin-Vilant (TRELEGY ELLIPTA) 100-62.5-25 MCG/ACT AEPB Inhale 1 puff into the lungs daily.   Hypertonic Nasal Wash (SINUS RINSE NA) Place into the nose as needed.  levothyroxine (SYNTHROID) 50 MCG tablet TAKE 1 TABLET EVERY OTHER  DAY   levothyroxine (SYNTHROID) 75 MCG tablet TAKE 1 TABLET EVERY OTHER  DAY   NON FORMULARY DE 3 Omega Benefits TID   Omega-3 Fatty Acids (FISH OIL PO) Take 1,200 mg by mouth daily.   omeprazole (PRILOSEC) 40 MG capsule Take 1 capsule (40 mg total) by mouth daily.   Probiotic Product (PROBIOTIC DAILY PO) Take 1 capsule by mouth.   rosuvastatin (CRESTOR) 10 MG tablet Take 1  tablet (10 mg total) by mouth daily.   sodium chloride irrigation 0.9 % irrigation sodium chloride 0.9 % irrigation solution   UNABLE TO FIND Take 1 Capful by mouth daily. Mega B-50   vitamin E 400 UNIT capsule Take 400 Units by mouth daily.   ELDERBERRY PO Take 1 capsule by mouth daily. Takes one gummy daily (Patient not taking: Reported on 04/24/2023)   No facility-administered encounter medications on file as of 04/24/2023.    Allergies (verified) Paxlovid [nirmatrelvir-ritonavir], Diclofenac, Ibuprofen, Tramadol, Hydromorphone, and Oxycodone   History: Past Medical History:  Diagnosis Date   Arthritis    Environmental allergies    Esophagitis    Barrets, followed by Dr Elsie Amis   GERD (gastroesophageal reflux disease)    Hypercholesterolemia    Hypothyroidism    Past Surgical History:  Procedure Laterality Date   ANKLE SURGERY  06/13/1965   cataract surgery Right 02/2023   cataract surgery Left 02/2023   KNEE ARTHROSCOPY  12/03/2007   left   KNEE ARTHROSCOPY  06/25/2008   left   NASAL SINUS SURGERY  05/13/2005   REPLACEMENT TOTAL KNEE  01/04/2009   left   RHINOPLASTY  06/13/1984   VAGINAL HYSTERECTOMY  06/13/1978   ovaries left in place   WRIST SURGERY  06/13/1968   cyst removal   Family History  Problem Relation Age of Onset   Pulmonary disease Mother    Heart disease Father        heart attack   Diabetes Father    Multiple myeloma Brother    Breast cancer Maternal Aunt    Breast cancer Maternal Aunt    Prostate cancer Maternal Uncle    Hyperlipidemia Paternal Grandmother    Diabetes Paternal Grandmother    Stroke Paternal Grandmother    Hyperlipidemia Paternal Grandfather    Diabetes Paternal Grandfather    Pancreatic cancer Cousin    Social History   Socioeconomic History   Marital status: Married    Spouse name: Not on file   Number of children: Not on file   Years of education: Not on file   Highest education level: Not on file  Occupational  History   Not on file  Tobacco Use   Smoking status: Former    Current packs/day: 0.25    Average packs/day: 0.3 packs/day for 5.0 years (1.3 ttl pk-yrs)    Types: Cigarettes   Smokeless tobacco: Never   Tobacco comments:    Smoked a pack a week in her early 20's. Smoked for 5 years.  Vaping Use   Vaping status: Never Used  Substance and Sexual Activity   Alcohol use: Never    Comment: rare   Drug use: No   Sexual activity: Yes  Other Topics Concern   Not on file  Social History Narrative   married   Social Determinants of Health   Financial Resource Strain: Low Risk  (04/24/2023)   Overall Financial Resource Strain (CARDIA)    Difficulty of Paying Living  Expenses: Not hard at all  Food Insecurity: No Food Insecurity (04/24/2023)   Hunger Vital Sign    Worried About Running Out of Food in the Last Year: Never true    Ran Out of Food in the Last Year: Never true  Transportation Needs: No Transportation Needs (04/24/2023)   PRAPARE - Administrator, Civil Service (Medical): No    Lack of Transportation (Non-Medical): No  Physical Activity: Insufficiently Active (04/24/2023)   Exercise Vital Sign    Days of Exercise per Week: 2 days    Minutes of Exercise per Session: 60 min  Stress: No Stress Concern Present (04/24/2023)   [employer] of Occupational Health - Occupational Stress Questionnaire    Feeling of Stress : Only a little  Social Connections: Socially Integrated (04/24/2023)   Social Connection and Isolation Panel [NHANES]    Frequency of Communication with Friends and Family: More than three times a week    Frequency of Social Gatherings with Friends and Family: More than three times a week    Attends Religious Services: More than 4 times per year    Active Member of [employer] or Organizations: Yes    Attends Engineer, structural: More than 4 times per year    Marital Status: Married    Tobacco Counseling Counseling given: Not  Answered Tobacco comments: Smoked a pack a week in her early 20's. Smoked for 5 years.   Clinical Intake:  Pre-visit preparation completed: Yes  Pain : No/denies pain     BMI - recorded: 24.06 Nutritional Status: BMI of 19-24  Normal Nutritional Risks: None Diabetes: No  How often do you need to have someone help you when you read instructions, pamphlets, or other written materials from your doctor or pharmacy?: 1 - Never  Interpreter Needed?: No  Information entered by :: R. Jeena Arnett LPN   Activities of Daily Living    04/24/2023    1:33 PM  In your present state of health, do you have any difficulty performing the following activities:  Hearing? 0  Vision? 0  Difficulty concentrating or making decisions? 0  Walking or climbing stairs? 0  Dressing or bathing? 0  Doing errands, shopping? 0  Preparing Food and eating ? N  Using the Toilet? N  In the past six months, have you accidently leaked urine? N  Do you have problems with loss of bowel control? N  Managing your Medications? N  Managing your Finances? N  Housekeeping or managing your Housekeeping? N    Patient Care Team: Dale Redwater, MD as PCP - General (Internal Medicine) Salena Saner, MD as Consulting Physician (Pulmonary Disease)  Indicate any recent Medical Services you may have received from other than [facility] providers in the past year (date may be approximate).     Assessment:   This is a routine wellness examination for Lekeya.  Hearing/Vision screen Hearing Screening - Comments:: No issues Vision Screening - Comments:: No glasses   Goals Addressed             This Visit's Progress    Patient Stated       Wants to exercise more        Depression Screen    04/24/2023    1:44 PM 05/11/2022   11:48 AM 04/18/2022    3:34 PM 12/08/2021   11:41 AM 10/20/2021    9:08 AM 06/18/2021   11:40 AM 03/15/2021    3:48 PM  PHQ 2/9 Scores  PHQ -  2 Score 0 0 0 0 0 0 0  PHQ- 9 Score 1           Fall Risk    04/24/2023    1:36 PM 05/11/2022   11:48 AM 04/18/2022    3:42 PM 12/08/2021   11:41 AM 10/20/2021    9:08 AM  Fall Risk   Falls in the past year? 0 0 0 0 0  Number falls in past yr: 0  0    Injury with Fall? 0  0    Risk for fall due to : No Fall Risks  No Fall Risks No Fall Risks No Fall Risks  Follow up Falls prevention discussed;Falls evaluation completed Falls evaluation completed Falls evaluation completed;Falls prevention discussed Falls evaluation completed Falls evaluation completed    MEDICARE RISK AT HOME: Medicare Risk at Home Any stairs in or around the home?: No If so, are there any without handrails?: No Home free of loose throw rugs in walkways, pet beds, electrical cords, etc?: Yes Adequate lighting in your home to reduce risk of falls?: Yes Life alert?: No Use of a cane, walker or w/c?: No Grab bars in the bathroom?: Yes Shower chair or bench in shower?: Yes Elevated toilet seat or a handicapped toilet?: Yes   Cognitive Function:    09/18/2017    9:36 AM 08/02/2016    2:50 PM 05/27/2015   11:55 AM  MMSE - Mini Mental State Exam  Orientation to time 5 5 5   Orientation to Place 5 5 5   Registration 3 3 3   Attention/ Calculation 5 5 5   Recall 3 3 3   Language- name 2 objects 2 2 2   Language- repeat 1 1 1   Language- follow 3 step command 3 3 3   Language- read & follow direction 1 1 1   Write a sentence 1 1 1   Copy design 1 1 1   Total score 30 30 30         04/24/2023    1:51 PM 04/18/2022    3:48 PM 02/19/2019   11:55 AM  6CIT Screen  What Year? 0 points 0 points 0 points  What month? 0 points 0 points 0 points  What time? 0 points 0 points 0 points  Count back from 20 0 points 0 points 0 points  Months in reverse 0 points 0 points 0 points  Repeat phrase 0 points 0 points 0 points  Total Score 0 points 0 points 0 points    Immunizations Immunization History  Administered Date(s) Administered   Fluad Quad(high Dose 65+) 02/19/2019,  04/07/2020, 02/22/2021   Influenza Split 03/26/2012   Influenza, High Dose Seasonal PF 03/07/2017, 03/09/2018   Influenza,inj,Quad PF,6+ Mos 02/25/2013, 02/14/2014, 03/26/2015, 02/02/2016   Influenza,inj,quad, With Preservative 03/13/2017, 03/13/2018   Influenza-Unspecified 03/18/2023   PFIZER(Purple Top)SARS-COV-2 Vaccination 10/18/2019, 11/12/2019   Pneumococcal Conjugate-13 04/22/2015   Pneumococcal Polysaccharide-23 01/24/2011, 06/14/2015   Tdap 11/17/2015    TDAP status: Up to date  Flu Vaccine status: Up to date  Pneumococcal vaccine status: Up to date  Covid-19 vaccine status: Information provided on how to obtain vaccines.   Qualifies for Shingles Vaccine? Yes   Zostavax completed No   Shingrix Completed?: No.    Education has been provided regarding the importance of this vaccine. Patient has been advised to call insurance company to determine out of pocket expense if they have not yet received this vaccine. Advised may also receive vaccine at local pharmacy or Health Dept. Verbalized acceptance and understanding.  Screening Tests Health Maintenance  Topic Date Due   Colonoscopy  01/16/2023   Medicare Annual Wellness (AWV)  04/19/2023   COVID-19 Vaccine (3 - 2023-24 season) 05/06/2023 (Originally 02/12/2023)   Zoster Vaccines- Shingrix (1 of 2) 07/21/2023 (Originally 03/12/1995)   MAMMOGRAM  04/18/2024   DTaP/Tdap/Td (2 - Td or Tdap) 11/16/2025   Pneumonia Vaccine 60+ Years old  Completed   INFLUENZA VACCINE  Completed   DEXA SCAN  Completed   Hepatitis C Screening  Completed   HPV VACCINES  Aged Out    Health Maintenance  Health Maintenance Due  Topic Date Due   Colonoscopy  01/16/2023   Medicare Annual Wellness (AWV)  04/19/2023    Colorectal cancer screening: Type of screening: Colonoscopy. Completed 01/2018. Repeat every 5 years Patient stated that Dr. Loreta [address] GI doctor advised that she does not need but will discuss with her at next visit  Mammogram status:  Completed 04/2023. Repeat every year  Bone Density status: Completed 03/2021. Results reflect: Bone density results: OSTEOPENIA. Repeat every 2 years. Will discuss with PCP at next visit  Lung Cancer Screening: (Low Dose CT Chest recommended if Age 65-80 years, 20 pack-year currently smoking OR have quit w/in 15years.) does not qualify.     Additional Screening:  Hepatitis C Screening: does qualify; Completed 07/2015  Vision Screening: Recommended annual ophthalmology exams for early detection of glaucoma and other disorders of the eye. Is the patient up to date with their annual eye exam?  Yes  Who is the provider or what is the name of the office in which the patient attends annual eye exams? [facility] If pt is not established with a provider, would they like to be referred to a provider to establish care? No .   Dental Screening: Recommended annual dental exams for proper oral hygiene    Community Resource Referral / Chronic Care Management: CRR required this visit?  No   CCM required this visit?  No     Plan:     I have personally reviewed and noted the following in the patient's chart:   Medical and social history Use of alcohol, tobacco or illicit drugs  Current medications and supplements including opioid prescriptions. Patient is not currently taking opioid prescriptions. Functional ability and status Nutritional status Physical activity Advanced directives List of other physicians Hospitalizations, surgeries, and ER visits in previous 12 months Vitals Screenings to include cognitive, depression, and falls Referrals and appointments  In addition, I have reviewed and discussed with patient certain preventive protocols, quality metrics, and best practice recommendations. A written personalized care plan for preventive services as well as general preventive health recommendations were provided to patient.     Sydell Axon, LPN   [id number]   After Visit  Summary: (MyChart) Due to this being a telephonic visit, the after visit summary with patients personalized plan was offered to patient via MyChart   Nurse Notes: None

## 2023-04-25 ENCOUNTER — Telehealth: Payer: Self-pay | Admitting: Internal Medicine

## 2023-04-25 NOTE — Telephone Encounter (Signed)
Lft pt vm to call ofc to sch Vas [place]. thanks

## 2023-04-25 NOTE — Telephone Encounter (Signed)
Order placed for carotid ultrasound.   

## 2023-04-27 DIAGNOSIS — L820 Inflamed seborrheic keratosis: Secondary | ICD-10-CM | POA: Diagnosis not present

## 2023-04-28 NOTE — Telephone Encounter (Signed)
She should check with insurance to see what lower cost alternatives there are.  We can provide her a coupon for the first 30 days free.  Once she contacts her insurance and find out what alternatives are we can then prescribe those.

## 2023-05-03 ENCOUNTER — Ambulatory Visit (INDEPENDENT_AMBULATORY_CARE_PROVIDER_SITE_OTHER): Payer: Medicare HMO

## 2023-05-03 DIAGNOSIS — I779 Disorder of arteries and arterioles, unspecified: Secondary | ICD-10-CM

## 2023-05-16 ENCOUNTER — Other Ambulatory Visit: Payer: Self-pay | Admitting: Internal Medicine

## 2023-05-17 DIAGNOSIS — J849 Interstitial pulmonary disease, unspecified: Secondary | ICD-10-CM | POA: Diagnosis not present

## 2023-05-17 DIAGNOSIS — R768 Other specified abnormal immunological findings in serum: Secondary | ICD-10-CM | POA: Diagnosis not present

## 2023-05-17 DIAGNOSIS — M150 Primary generalized (osteo)arthritis: Secondary | ICD-10-CM | POA: Diagnosis not present

## 2023-05-21 DIAGNOSIS — R69 Illness, unspecified: Secondary | ICD-10-CM | POA: Diagnosis not present

## 2023-06-02 DIAGNOSIS — M19072 Primary osteoarthritis, left ankle and foot: Secondary | ICD-10-CM | POA: Diagnosis not present

## 2023-06-02 DIAGNOSIS — M19071 Primary osteoarthritis, right ankle and foot: Secondary | ICD-10-CM | POA: Diagnosis not present

## 2023-06-12 ENCOUNTER — Other Ambulatory Visit: Payer: Self-pay | Admitting: Internal Medicine

## 2023-06-27 ENCOUNTER — Encounter: Payer: Self-pay | Admitting: Internal Medicine

## 2023-06-29 NOTE — Telephone Encounter (Signed)
Patient scheduled.

## 2023-06-30 ENCOUNTER — Other Ambulatory Visit (HOSPITAL_COMMUNITY): Payer: Self-pay

## 2023-07-03 DIAGNOSIS — M13841 Other specified arthritis, right hand: Secondary | ICD-10-CM | POA: Diagnosis not present

## 2023-07-03 DIAGNOSIS — M13842 Other specified arthritis, left hand: Secondary | ICD-10-CM | POA: Diagnosis not present

## 2023-07-06 DIAGNOSIS — K5730 Diverticulosis of large intestine without perforation or abscess without bleeding: Secondary | ICD-10-CM | POA: Diagnosis not present

## 2023-07-06 DIAGNOSIS — K219 Gastro-esophageal reflux disease without esophagitis: Secondary | ICD-10-CM | POA: Diagnosis not present

## 2023-07-06 DIAGNOSIS — K449 Diaphragmatic hernia without obstruction or gangrene: Secondary | ICD-10-CM | POA: Diagnosis not present

## 2023-07-06 DIAGNOSIS — R1013 Epigastric pain: Secondary | ICD-10-CM | POA: Diagnosis not present

## 2023-07-10 ENCOUNTER — Ambulatory Visit (INDEPENDENT_AMBULATORY_CARE_PROVIDER_SITE_OTHER): Payer: HMO | Admitting: Internal Medicine

## 2023-07-10 ENCOUNTER — Encounter: Payer: Self-pay | Admitting: Internal Medicine

## 2023-07-10 VITALS — BP 128/72 | HR 79 | Temp 98.0°F | Resp 16 | Ht 63.5 in | Wt 137.4 lb

## 2023-07-10 DIAGNOSIS — E7800 Pure hypercholesterolemia, unspecified: Secondary | ICD-10-CM | POA: Diagnosis not present

## 2023-07-10 DIAGNOSIS — E039 Hypothyroidism, unspecified: Secondary | ICD-10-CM | POA: Diagnosis not present

## 2023-07-10 DIAGNOSIS — N816 Rectocele: Secondary | ICD-10-CM | POA: Insufficient documentation

## 2023-07-10 DIAGNOSIS — F439 Reaction to severe stress, unspecified: Secondary | ICD-10-CM

## 2023-07-10 DIAGNOSIS — J849 Interstitial pulmonary disease, unspecified: Secondary | ICD-10-CM | POA: Diagnosis not present

## 2023-07-10 NOTE — Assessment & Plan Note (Signed)
Overall appears to be handling things relatively well. Taking buspar at night - helping her sleep. Follow.

## 2023-07-10 NOTE — Assessment & Plan Note (Signed)
Did not tolerate repatha.  On crestor.  Not taking zetia. Did not tolerate.  Follow lipid panel.   Lab Results  Component Value Date   CHOL 189 04/18/2023   HDL 65.10 04/18/2023   LDLCALC 108 (H) 04/18/2023   LDLDIRECT 123.4 02/27/2013   TRIG 81.0 04/18/2023   CHOLHDL 3 04/18/2023

## 2023-07-10 NOTE — Progress Notes (Signed)
Subjective:    Patient ID: [name], female    DOB: [birth year], 78 y.o.   MRN: [id number]  Patient here for  Chief Complaint  Patient presents with   Gynecologic Exam         HPI Here for a scheduled follow up.  Saw Dr Jayme Cloud 09/21/22 - PFTs show FEV1 of 1.78 L or 91% predicted, FVC 2.74 L or 104% predicted, FEV1/FVC of 65%, no bronchodilator response. Lung volumes are normal. Diffusion capacity mildly reduced. Consistent with mild obstructive airways disease and mild diffusion defect.CT chest high-resolution showed unchanged mild pulmonary fibrosis in a pattern that shows mostly, mild traction bronchiectasis and minimal subpleural bronchiolectasis. Recommended AirSupra. Had f/u 04/17/23 (Dr Jayme Cloud). Recommended f/u CT in 09/2023. Also recommended starting trelegy.  Saw rheumatology 05/17/23 - f/u ANA positive SS-A antibody positive - per review, felt to be false positive. Feels exam most c/w OA. Recommended continue to monitor. Sees Dr Amanda Pea - f/u hands - s/p injections. Had f/u GI 07/06/23 - does not require EGD at this time. Felt stable. She comes in for work in appt - work in with concerns regarding notice of a heavy feeling in her vagina - (at times). Has noticed a bulge in the vaginal opening. Notices more after straining. No other abdominal pain. Bowels are moving. No urine change. Some discomfort with intercourse.    Past Medical History:  Diagnosis Date   Arthritis    Environmental allergies    Esophagitis    Barrets, followed by Dr Elsie Amis   GERD (gastroesophageal reflux disease)    Hypercholesterolemia    Hypothyroidism    Past Surgical History:  Procedure Laterality Date   ANKLE SURGERY  06/13/1965   cataract surgery Right 02/2023   cataract surgery Left 02/2023   KNEE ARTHROSCOPY  12/03/2007   left   KNEE ARTHROSCOPY  06/25/2008   left   NASAL SINUS SURGERY  05/13/2005   REPLACEMENT TOTAL KNEE  01/04/2009   left   RHINOPLASTY  06/13/1984   VAGINAL  HYSTERECTOMY  06/13/1978   ovaries left in place   WRIST SURGERY  06/13/1968   cyst removal   Family History  Problem Relation Age of Onset   Pulmonary disease Mother    Heart disease Father        heart attack   Diabetes Father    Multiple myeloma Brother    Breast cancer Maternal Aunt    Breast cancer Maternal Aunt    Prostate cancer Maternal Uncle    Hyperlipidemia Paternal Grandmother    Diabetes Paternal Grandmother    Stroke Paternal Grandmother    Hyperlipidemia Paternal Grandfather    Diabetes Paternal Grandfather    Pancreatic cancer Cousin    Social History   Socioeconomic History   Marital status: Married    Spouse name: Not on file   Number of children: Not on file   Years of education: Not on file   Highest education level: Not on file  Occupational History   Not on file  Tobacco Use   Smoking status: Former    Current packs/day: 0.25    Average packs/day: 0.3 packs/day for 5.0 years (1.3 ttl pk-yrs)    Types: Cigarettes   Smokeless tobacco: Never   Tobacco comments:    Smoked a pack a week in her early 20's. Smoked for 5 years.  Vaping Use   Vaping status: Never Used  Substance and Sexual Activity   Alcohol use: Never    Comment:  rare   Drug use: No   Sexual activity: Yes  Other Topics Concern   Not on file  Social History Narrative   married   Social Drivers of Corporate investment banker Strain: Low Risk  (04/24/2023)   Overall Financial Resource Strain (CARDIA)    Difficulty of Paying Living Expenses: Not hard at all  Food Insecurity: No Food Insecurity (04/24/2023)   Hunger Vital Sign    Worried About Running Out of Food in the Last Year: Never true    Ran Out of Food in the Last Year: Never true  Transportation Needs: No Transportation Needs (04/24/2023)   PRAPARE - Administrator, Civil Service (Medical): No    Lack of Transportation (Non-Medical): No  Physical Activity: Insufficiently Active (04/24/2023)   Exercise  Vital Sign    Days of Exercise per Week: 2 days    Minutes of Exercise per Session: 60 min  Stress: No Stress Concern Present (04/24/2023)   [employer] of Occupational Health - Occupational Stress Questionnaire    Feeling of Stress : Only a little  Social Connections: Socially Integrated (04/24/2023)   Social Connection and Isolation Panel [NHANES]    Frequency of Communication with Friends and Family: More than three times a week    Frequency of Social Gatherings with Friends and Family: More than three times a week    Attends Religious Services: More than 4 times per year    Active Member of [employer] or Organizations: Yes    Attends Engineer, structural: More than 4 times per year    Marital Status: Married     Review of Systems  Constitutional:  Negative for appetite change and unexpected weight change.  HENT:  Negative for congestion and sinus pressure.   Respiratory:  Negative for cough, chest tightness and shortness of breath.   Cardiovascular:  Negative for chest pain, palpitations and leg swelling.  Gastrointestinal:  Negative for abdominal pain, diarrhea, nausea and vomiting.  Genitourinary:  Negative for difficulty urinating and dysuria.       Vaginal "bulge" as outlined.   Musculoskeletal:  Negative for joint swelling and myalgias.  Skin:  Negative for color change and rash.  Neurological:  Negative for dizziness and headaches.  Psychiatric/Behavioral:  Negative for agitation and dysphoric mood.        Objective:     BP 128/72   Pulse 79   Temp 98 F (36.7 C)   Resp 16   Ht 5' 3.5" (1.613 m)   Wt 137 lb 6.4 oz (62.3 kg)   SpO2 98%   BMI 23.96 kg/m  Wt Readings from Last 3 Encounters:  07/10/23 137 lb 6.4 oz (62.3 kg)  04/24/23 138 lb (62.6 kg)  04/20/23 138 lb 12.8 oz (63 kg)    Physical Exam Vitals reviewed.  Constitutional:      General: She is not in acute distress.    Appearance: Normal appearance.  HENT:     Head: Normocephalic and  atraumatic.     Right Ear: External ear normal.     Left Ear: External ear normal.     Mouth/Throat:     Pharynx: No oropharyngeal exudate or posterior oropharyngeal erythema.  Eyes:     General: No scleral icterus.       Right eye: No discharge.        Left eye: No discharge.     Conjunctiva/sclera: Conjunctivae normal.  Neck:     Thyroid: No thyromegaly.  Cardiovascular:     Rate and Rhythm: Normal rate and regular rhythm.  Pulmonary:     Effort: No respiratory distress.     Breath sounds: Normal breath sounds. No wheezing.  Abdominal:     General: Bowel sounds are normal.     Palpations: Abdomen is soft.     Tenderness: There is no abdominal tenderness.  Genitourinary:    Comments: Normal external genitalia. Appears to have rectocele/vaginal wall weakness with bearing down. No adnexal masses or tenderness.  Musculoskeletal:        General: No swelling or tenderness.     Cervical back: Neck supple. No tenderness.  Lymphadenopathy:     Cervical: No cervical adenopathy.  Skin:    Findings: No erythema or rash.  Neurological:     Mental Status: She is alert.  Psychiatric:        Mood and Affect: Mood normal.        Behavior: Behavior normal.         Outpatient Encounter Medications as of 07/10/2023  Medication Sig   acetaminophen (TYLENOL) 500 MG tablet Take 500 mg by mouth every 6 (six) hours as needed.   Albuterol-Budesonide (AIRSUPRA) 90-80 MCG/ACT AERO Inhale 2 puffs into the lungs every 4 (four) hours as needed.   Ascorbic Acid (VITAMIN C) 500 MG CAPS Take 500 mg by mouth daily.   aspirin EC 81 MG tablet Take 1 tablet (81 mg total) by mouth daily. Swallow whole.   B Complex Vitamins (VITAMIN B-COMPLEX) TABS Take 1 tablet by mouth daily.   busPIRone (BUSPAR) 5 MG tablet TAKE 1 TABLET BY MOUTH DAILY AS NEEDED.   chlorhexidine (PERIDEX) 0.12 % solution SMARTSIG:By Mouth   Cholecalciferol (VITAMIN D PO) Take 10,000 Units by mouth daily.    Coenzyme Q10 (COQ-10 PO)  Take 120 mg by mouth daily.   COLLAGEN PO Take 11 mg by mouth daily.   diclofenac sodium (VOLTAREN) 1 % GEL Apply 1 application topically as needed.   fluocinonide cream (LIDEX) 0.05 % Apply 1 application topically 2 (two) times daily as needed.    Fluticasone-Umeclidin-Vilant (TRELEGY ELLIPTA) 100-62.5-25 MCG/ACT AEPB Inhale 1 puff into the lungs daily.   Hypertonic Nasal Wash (SINUS RINSE NA) Place into the nose as needed.   levothyroxine (SYNTHROID) 50 MCG tablet TAKE 1 TABLET EVERY OTHER  DAY   levothyroxine (SYNTHROID) 75 MCG tablet TAKE 1 TABLET EVERY OTHER  DAY   NON FORMULARY DE 3 Omega Benefits TID   Omega-3 Fatty Acids (FISH OIL PO) Take 1,200 mg by mouth daily.   omeprazole (PRILOSEC) 40 MG capsule Take 1 capsule (40 mg total) by mouth daily.   Probiotic Product (PROBIOTIC DAILY PO) Take 1 capsule by mouth.   rosuvastatin (CRESTOR) 10 MG tablet Take 1 tablet (10 mg total) by mouth daily.   sodium chloride irrigation 0.9 % irrigation sodium chloride 0.9 % irrigation solution   UNABLE TO FIND Take 1 Capful by mouth daily. Mega B-50   vitamin E 400 UNIT capsule Take 400 Units by mouth daily.   [DISCONTINUED] ELDERBERRY PO Take 1 capsule by mouth daily. Takes one gummy daily (Patient not taking: Reported on 04/24/2023)   No facility-administered encounter medications on file as of 07/10/2023.     Lab Results  Component Value Date   WBC 4.2 04/18/2023   HGB 13.0 04/18/2023   HCT 39.3 04/18/2023   PLT 254.0 04/18/2023   GLUCOSE 89 04/18/2023   CHOL 189 04/18/2023   TRIG 81.0 04/18/2023  HDL 65.10 04/18/2023   LDLDIRECT 123.4 02/27/2013   LDLCALC 108 (H) 04/18/2023   ALT 17 04/18/2023   AST 21 04/18/2023   NA 142 04/18/2023   K 4.3 04/18/2023   CL 104 04/18/2023   CREATININE 0.69 04/18/2023   BUN 15 04/18/2023   CO2 31 04/18/2023   TSH 3.32 12/08/2022   HGBA1C 5.5 04/18/2023    MM 3D SCREEN BREAST BILATERAL Result Date: 04/21/2023 CLINICAL DATA:  Screening. EXAM:  DIGITAL SCREENING BILATERAL MAMMOGRAM WITH TOMOSYNTHESIS AND CAD TECHNIQUE: Bilateral screening digital craniocaudal and mediolateral oblique mammograms were obtained. Bilateral screening digital breast tomosynthesis was performed. The images were evaluated with computer-aided detection. COMPARISON:  Previous exam(s). ACR Breast Density Category c: The breasts are heterogeneously dense, which may obscure small masses. FINDINGS: There are no findings suspicious for malignancy. IMPRESSION: No mammographic evidence of malignancy. A result letter of this screening mammogram will be mailed directly to the patient. RECOMMENDATION: Screening mammogram in one year. (Code:SM-B-01Y) BI-RADS CATEGORY  1: Negative. Electronically Signed   By: Harmon Pier M.D.   On: 04/21/2023 12:13       Assessment & Plan:  Rectocele Assessment & Plan: Vaginal exam appears to be c/w ? rectocele/vaginal wall weakness.  Symptoms as outlined. Will have gyn evaluate. Also refer to PFPT for further evaluation and treatment.   Orders: -     Ambulatory referral to Physical Therapy -     Ambulatory referral to Gynecology  Stress Assessment & Plan: Overall appears to be handling things relatively well. Taking buspar at night - helping her sleep. Follow.    ILD (interstitial lung disease) (HCC) Assessment & Plan: F/u pulmonary (04/2023) - high resolution CT due 09/2023.  F/u nodules. Breathing stable.    Hypothyroidism, unspecified type Assessment & Plan: Continue thyroid replacement.     Hypercholesteremia Assessment & Plan: Did not tolerate repatha.  On crestor.  Not taking zetia. Did not tolerate.  Follow lipid panel.   Lab Results  Component Value Date   CHOL 189 04/18/2023   HDL 65.10 04/18/2023   LDLCALC 108 (H) 04/18/2023   LDLDIRECT 123.4 02/27/2013   TRIG 81.0 04/18/2023   CHOLHDL 3 04/18/2023         [place], MD

## 2023-07-10 NOTE — Assessment & Plan Note (Signed)
Vaginal exam appears to be c/w ? rectocele/vaginal wall weakness.  Symptoms as outlined. Will have gyn evaluate. Also refer to PFPT for further evaluation and treatment.

## 2023-07-10 NOTE — Assessment & Plan Note (Signed)
F/u pulmonary (04/2023) - high resolution CT due 09/2023.  F/u nodules. Breathing stable.

## 2023-07-10 NOTE — Assessment & Plan Note (Signed)
Continue thyroid replacement.

## 2023-07-13 DIAGNOSIS — N816 Rectocele: Secondary | ICD-10-CM | POA: Diagnosis not present

## 2023-07-13 DIAGNOSIS — N8111 Cystocele, midline: Secondary | ICD-10-CM | POA: Diagnosis not present

## 2023-08-08 DIAGNOSIS — H524 Presbyopia: Secondary | ICD-10-CM | POA: Diagnosis not present

## 2023-08-08 DIAGNOSIS — H0288B Meibomian gland dysfunction left eye, upper and lower eyelids: Secondary | ICD-10-CM | POA: Diagnosis not present

## 2023-08-08 DIAGNOSIS — H0288A Meibomian gland dysfunction right eye, upper and lower eyelids: Secondary | ICD-10-CM | POA: Diagnosis not present

## 2023-08-08 DIAGNOSIS — Z961 Presence of intraocular lens: Secondary | ICD-10-CM | POA: Diagnosis not present

## 2023-08-21 DIAGNOSIS — H00025 Hordeolum internum left lower eyelid: Secondary | ICD-10-CM | POA: Diagnosis not present

## 2023-08-22 ENCOUNTER — Other Ambulatory Visit (INDEPENDENT_AMBULATORY_CARE_PROVIDER_SITE_OTHER): Payer: Medicare HMO

## 2023-08-22 DIAGNOSIS — E7800 Pure hypercholesterolemia, unspecified: Secondary | ICD-10-CM

## 2023-08-22 DIAGNOSIS — R739 Hyperglycemia, unspecified: Secondary | ICD-10-CM

## 2023-08-22 LAB — LIPID PANEL
Cholesterol: 174 mg/dL (ref 0–200)
HDL: 64 mg/dL
LDL Cholesterol: 94 mg/dL (ref 0–99)
NonHDL: 109.57
Total CHOL/HDL Ratio: 3
Triglycerides: 79 mg/dL (ref 0.0–149.0)
VLDL: 15.8 mg/dL (ref 0.0–40.0)

## 2023-08-22 LAB — BASIC METABOLIC PANEL WITH GFR
BUN: 13 mg/dL (ref 6–23)
CO2: 31 meq/L (ref 19–32)
Calcium: 9.3 mg/dL (ref 8.4–10.5)
Chloride: 104 meq/L (ref 96–112)
Creatinine, Ser: 0.68 mg/dL (ref 0.40–1.20)
GFR: 83.4 mL/min
Glucose, Bld: 96 mg/dL (ref 70–99)
Potassium: 3.6 meq/L (ref 3.5–5.1)
Sodium: 142 meq/L (ref 135–145)

## 2023-08-22 LAB — HEPATIC FUNCTION PANEL
ALT: 19 U/L (ref 0–35)
AST: 22 U/L (ref 0–37)
Albumin: 4.4 g/dL (ref 3.5–5.2)
Alkaline Phosphatase: 58 U/L (ref 39–117)
Bilirubin, Direct: 0.1 mg/dL (ref 0.0–0.3)
Total Bilirubin: 0.5 mg/dL (ref 0.2–1.2)
Total Protein: 6.7 g/dL (ref 6.0–8.3)

## 2023-08-22 LAB — HEMOGLOBIN A1C: Hgb A1c MFr Bld: 5.5 % (ref 4.6–6.5)

## 2023-08-24 ENCOUNTER — Encounter: Payer: Self-pay | Admitting: Internal Medicine

## 2023-08-24 ENCOUNTER — Ambulatory Visit (INDEPENDENT_AMBULATORY_CARE_PROVIDER_SITE_OTHER): Payer: Medicare HMO | Admitting: Internal Medicine

## 2023-08-24 ENCOUNTER — Encounter: Payer: Self-pay | Admitting: Pulmonary Disease

## 2023-08-24 VITALS — BP 98/72 | HR 73 | Temp 98.2°F | Ht 62.0 in | Wt 143.4 lb

## 2023-08-24 DIAGNOSIS — Z860100 Personal history of colon polyps, unspecified: Secondary | ICD-10-CM

## 2023-08-24 DIAGNOSIS — Z0000 Encounter for general adult medical examination without abnormal findings: Secondary | ICD-10-CM

## 2023-08-24 DIAGNOSIS — I779 Disorder of arteries and arterioles, unspecified: Secondary | ICD-10-CM

## 2023-08-24 DIAGNOSIS — I700 Atherosclerosis of aorta: Secondary | ICD-10-CM

## 2023-08-24 DIAGNOSIS — R200 Anesthesia of skin: Secondary | ICD-10-CM | POA: Diagnosis not present

## 2023-08-24 DIAGNOSIS — K2100 Gastro-esophageal reflux disease with esophagitis, without bleeding: Secondary | ICD-10-CM

## 2023-08-24 DIAGNOSIS — F439 Reaction to severe stress, unspecified: Secondary | ICD-10-CM | POA: Diagnosis not present

## 2023-08-24 DIAGNOSIS — J849 Interstitial pulmonary disease, unspecified: Secondary | ICD-10-CM

## 2023-08-24 DIAGNOSIS — I2510 Atherosclerotic heart disease of native coronary artery without angina pectoris: Secondary | ICD-10-CM | POA: Diagnosis not present

## 2023-08-24 DIAGNOSIS — E7800 Pure hypercholesterolemia, unspecified: Secondary | ICD-10-CM | POA: Diagnosis not present

## 2023-08-24 DIAGNOSIS — R739 Hyperglycemia, unspecified: Secondary | ICD-10-CM | POA: Diagnosis not present

## 2023-08-24 DIAGNOSIS — E039 Hypothyroidism, unspecified: Secondary | ICD-10-CM

## 2023-08-24 MED ORDER — ROSUVASTATIN CALCIUM 10 MG PO TABS: 10.0000 mg | ORAL_TABLET | Freq: Every day | ORAL | 3 refills | Status: AC

## 2023-08-24 MED ORDER — LEVOTHYROXINE SODIUM 75 MCG PO TABS: 75.0000 ug | ORAL_TABLET | ORAL | 3 refills | Status: AC

## 2023-08-24 MED ORDER — OMEPRAZOLE 40 MG PO CPDR: 40.0000 mg | DELAYED_RELEASE_CAPSULE | Freq: Every day | ORAL | 3 refills | Status: AC

## 2023-08-24 MED ORDER — BUSPIRONE HCL 5 MG PO TABS: 5.0000 mg | ORAL_TABLET | Freq: Every day | ORAL | 0 refills | Status: DC | PRN

## 2023-08-24 MED ORDER — LEVOTHYROXINE SODIUM 50 MCG PO TABS: 50.0000 ug | ORAL_TABLET | ORAL | 3 refills | Status: DC

## 2023-08-24 NOTE — Assessment & Plan Note (Signed)
 Physical today 08/24/23.  Colonoscopy 01/2018.  Recommended f/u in 5 years.  Mammogram 04/19/23- [facility] I.  Bone density 03/2021 - osteopenia.  Continue calcium, vitamin D and weight bearing exercise.

## 2023-08-24 NOTE — Progress Notes (Signed)
 Subjective:    Patient ID: [name], female    DOB: [birth year], 78 y.o.   MRN: [id number]  Patient here for  Chief Complaint  Patient presents with   Annual Exam    HPI Here for a physical exam. Saw Dr Jayme Cloud 09/21/22 - PFTs show FEV1 of 1.78 L or 91% predicted, FVC 2.74 L or 104% predicted, FEV1/FVC of 65%, no bronchodilator response. Lung volumes are normal. Diffusion capacity mildly reduced. Consistent with mild obstructive airways disease and mild diffusion defect.CT chest high-resolution showed unchanged mild pulmonary fibrosis in a pattern that shows mostly, mild traction bronchiectasis and minimal subpleural bronchiolectasis. Recommended AirSupra. Had f/u 04/17/23 (Dr Jayme Cloud). Recommended f/u CT in 09/2023. Also recommended starting trelegy. Did not tolerate. Stopped 08/18/23. Has f/u CT scheduled 4/8 and f/u with Dr Jayme Cloud 09/28/23. Saw rheumatology 05/2023 - felt exam most c/w OA. Back doing yoga. Saw Dr Dalbert Garnet 07/13/23 - cystocele/rectocele. Recommended vaginal estrogen and PFPT. PFPT scheduled. Doing kegel exercises. Does report some fatigue. Discussed checking b12. Reports third and fourth finger (right) numb. Has DDD - c-spine. Discussed NCS. Also requested to have refill acyclovir - for intermittent flares cold sores.   Past Medical History:  Diagnosis Date   Arthritis    Environmental allergies    Esophagitis    [place]rets, followed by Dr Elsie Amis   GERD (gastroesophageal reflux disease)    Hypercholesterolemia    Hypothyroidism    Past Surgical History:  Procedure Laterality Date   ANKLE SURGERY  06/13/1965   cataract surgery Right 02/2023   cataract surgery Left 02/2023   KNEE ARTHROSCOPY  12/03/2007   left   KNEE ARTHROSCOPY  06/25/2008   left   NASAL SINUS SURGERY  05/13/2005   REPLACEMENT TOTAL KNEE  01/04/2009   left   RHINOPLASTY  06/13/1984   VAGINAL HYSTERECTOMY  06/13/1978   ovaries left in place   WRIST SURGERY  06/13/1968   cyst removal    Family History  Problem Relation Age of Onset   Pulmonary disease Mother    Heart disease Father        heart attack   Diabetes Father    Multiple myeloma Brother    Breast cancer Maternal Aunt    Breast cancer Maternal Aunt    Prostate cancer Maternal Uncle    Hyperlipidemia Paternal Grandmother    Diabetes Paternal Grandmother    Stroke Paternal Grandmother    Hyperlipidemia Paternal Grandfather    Diabetes Paternal Grandfather    Pancreatic cancer Cousin    Social History   Socioeconomic History   Marital status: Married    Spouse name: Not on file   Number of children: Not on file   Years of education: Not on file   Highest education level: Not on file  Occupational History   Not on file  Tobacco Use   Smoking status: Former    Current packs/day: 0.25    Average packs/day: 0.3 packs/day for 5.0 years (1.3 ttl pk-yrs)    Types: Cigarettes   Smokeless tobacco: Never   Tobacco comments:    Smoked a pack a week in her early 20's. Smoked for 5 years.  Vaping Use   Vaping status: Never Used  Substance and Sexual Activity   Alcohol use: Never    Comment: rare   Drug use: No   Sexual activity: Yes  Other Topics Concern   Not on file  Social History Narrative   married   Social Drivers of Health  Financial Resource Strain: Low Risk  (04/24/2023)   Overall Financial Resource Strain (CARDIA)    Difficulty of Paying Living Expenses: Not hard at all  Food Insecurity: No Food Insecurity (04/24/2023)   Hunger Vital Sign    Worried About Running Out of Food in the Last Year: Never true    Ran Out of Food in the Last Year: Never true  Transportation Needs: No Transportation Needs (04/24/2023)   PRAPARE - Administrator, Civil Service (Medical): No    Lack of Transportation (Non-Medical): No  Physical Activity: Insufficiently Active (04/24/2023)   Exercise Vital Sign    Days of Exercise per Week: 2 days    Minutes of Exercise per Session: 60 min   Stress: No Stress Concern Present (04/24/2023)   [employer] of Occupational Health - Occupational Stress Questionnaire    Feeling of Stress : Only a little  Social Connections: Socially Integrated (04/24/2023)   Social Connection and Isolation Panel [NHANES]    Frequency of Communication with Friends and Family: More than three times a week    Frequency of Social Gatherings with Friends and Family: More than three times a week    Attends Religious Services: More than 4 times per year    Active Member of [employer] or Organizations: Yes    Attends Engineer, structural: More than 4 times per year    Marital Status: Married     Review of Systems  Constitutional:  Positive for fatigue. Negative for appetite change and unexpected weight change.  HENT:  Negative for congestion and sinus pressure.   Respiratory:  Negative for cough, chest tightness and shortness of breath.   Cardiovascular:  Negative for chest pain, palpitations and leg swelling.  Gastrointestinal:  Negative for abdominal pain, diarrhea, nausea and vomiting.  Genitourinary:  Negative for difficulty urinating and dysuria.  Musculoskeletal:  Negative for joint swelling and myalgias.  Skin:  Negative for color change and rash.  Neurological:  Negative for dizziness and headaches.  Psychiatric/Behavioral:  Negative for agitation and dysphoric mood.        Objective:     BP 98/72   Pulse 73   Temp 98.2 F (36.8 C) (Oral)   Ht 5\' 2"  (1.575 m)   Wt 143 lb 6.4 oz (65 kg)   SpO2 99%   BMI 26.23 kg/m  Wt Readings from Last 3 Encounters:  08/24/23 143 lb 6.4 oz (65 kg)  07/10/23 137 lb 6.4 oz (62.3 kg)  04/24/23 138 lb (62.6 kg)    Physical Exam Vitals reviewed.  Constitutional:      General: She is not in acute distress.    Appearance: Normal appearance.  HENT:     Head: Normocephalic and atraumatic.     Right Ear: External ear normal.     Left Ear: External ear normal.     Mouth/Throat:      Pharynx: No oropharyngeal exudate or posterior oropharyngeal erythema.  Eyes:     General: No scleral icterus.       Right eye: No discharge.        Left eye: No discharge.     Conjunctiva/sclera: Conjunctivae normal.  Neck:     Thyroid: No thyromegaly.  Cardiovascular:     Rate and Rhythm: Normal rate and regular rhythm.  Pulmonary:     Effort: No respiratory distress.     Breath sounds: Normal breath sounds. No wheezing.  Abdominal:     General: Bowel sounds are normal.  Palpations: Abdomen is soft.     Tenderness: There is no abdominal tenderness.  Musculoskeletal:        General: No swelling or tenderness.     Cervical back: Neck supple. No tenderness.  Lymphadenopathy:     Cervical: No cervical adenopathy.  Skin:    Findings: No erythema or rash.  Neurological:     Mental Status: She is alert.  Psychiatric:        Mood and Affect: Mood normal.        Behavior: Behavior normal.         Outpatient Encounter Medications as of 08/24/2023  Medication Sig   acetaminophen (TYLENOL) 500 MG tablet Take 500 mg by mouth every 6 (six) hours as needed.   acyclovir (ZOVIRAX) 800 MG tablet Take 1 tablet (800 mg total) by mouth 2 (two) times daily as needed.   Albuterol-Budesonide (AIRSUPRA) 90-80 MCG/ACT AERO Inhale 2 puffs into the lungs every 4 (four) hours as needed.   Ascorbic Acid (VITAMIN C) 500 MG CAPS Take 500 mg by mouth daily.   B Complex Vitamins (VITAMIN B-COMPLEX) TABS Take 1 tablet by mouth daily.   Cholecalciferol (VITAMIN D PO) Take 10,000 Units by mouth daily.    Coenzyme Q10 (COQ-10 PO) Take 120 mg by mouth daily.   COLLAGEN PO Take 11 mg by mouth daily.   Hypertonic Nasal Wash (SINUS RINSE NA) Place into the nose as needed.   NON FORMULARY DE 3 Omega Benefits TID   Omega-3 Fatty Acids (FISH OIL PO) Take 1,200 mg by mouth daily.   Probiotic Product (PROBIOTIC DAILY PO) Take 1 capsule by mouth.   sodium chloride irrigation 0.9 % irrigation sodium chloride 0.9  % irrigation solution   UNABLE TO FIND Take 1 Capful by mouth daily. Mega B-50   vitamin E 400 UNIT capsule Take 400 Units by mouth daily.   aspirin EC 81 MG tablet Take 1 tablet (81 mg total) by mouth daily. Swallow whole. (Patient not taking: Reported on 08/24/2023)   busPIRone (BUSPAR) 5 MG tablet Take 1 tablet (5 mg total) by mouth daily as needed.   fluocinonide cream (LIDEX) 0.05 % Apply 1 application topically 2 (two) times daily as needed.  (Patient not taking: Reported on 08/24/2023)   levothyroxine (SYNTHROID) 75 MCG tablet Take 1 tablet (75 mcg total) by mouth every other day.   omeprazole (PRILOSEC) 40 MG capsule Take 1 capsule (40 mg total) by mouth daily.   rosuvastatin (CRESTOR) 10 MG tablet Take 1 tablet (10 mg total) by mouth daily.   [DISCONTINUED] busPIRone (BUSPAR) 5 MG tablet TAKE 1 TABLET BY MOUTH DAILY AS NEEDED.   [DISCONTINUED] chlorhexidine (PERIDEX) 0.12 % solution SMARTSIG:By Mouth (Patient not taking: Reported on 08/24/2023)   [DISCONTINUED] diclofenac sodium (VOLTAREN) 1 % GEL Apply 1 application topically as needed. (Patient not taking: Reported on 08/24/2023)   [DISCONTINUED] Fluticasone-Umeclidin-Vilant (TRELEGY ELLIPTA) 100-62.5-25 MCG/ACT AEPB Inhale 1 puff into the lungs daily. (Patient not taking: Reported on 08/24/2023)   [DISCONTINUED] levothyroxine (SYNTHROID) 50 MCG tablet TAKE 1 TABLET EVERY OTHER  DAY   [DISCONTINUED] levothyroxine (SYNTHROID) 50 MCG tablet Take 1 tablet (50 mcg total) by mouth every other day.   [DISCONTINUED] levothyroxine (SYNTHROID) 75 MCG tablet TAKE 1 TABLET EVERY OTHER  DAY   [DISCONTINUED] omeprazole (PRILOSEC) 40 MG capsule Take 1 capsule (40 mg total) by mouth daily.   [DISCONTINUED] rosuvastatin (CRESTOR) 10 MG tablet Take 1 tablet (10 mg total) by mouth daily.   No facility-administered  encounter medications on file as of 08/24/2023.     Lab Results  Component Value Date   WBC 4.2 04/18/2023   HGB 13.0 04/18/2023   HCT 39.3  04/18/2023   PLT 254.0 04/18/2023   GLUCOSE 96 08/22/2023   CHOL 174 08/22/2023   TRIG 79.0 08/22/2023   HDL 64.00 08/22/2023   LDLDIRECT 123.4 02/27/2013   LDLCALC 94 08/22/2023   ALT 19 08/22/2023   AST 22 08/22/2023   NA 142 08/22/2023   K 3.6 08/22/2023   CL 104 08/22/2023   CREATININE 0.68 08/22/2023   BUN 13 08/22/2023   CO2 31 08/22/2023   TSH 3.32 12/08/2022   HGBA1C 5.5 08/22/2023    MM 3D SCREEN BREAST BILATERAL Result Date: 04/21/2023 CLINICAL DATA:  Screening. EXAM: DIGITAL SCREENING BILATERAL MAMMOGRAM WITH TOMOSYNTHESIS AND CAD TECHNIQUE: Bilateral screening digital craniocaudal and mediolateral oblique mammograms were obtained. Bilateral screening digital breast tomosynthesis was performed. The images were evaluated with computer-aided detection. COMPARISON:  Previous exam(s). ACR Breast Density Category c: The breasts are heterogeneously dense, which may obscure small masses. FINDINGS: There are no findings suspicious for malignancy. IMPRESSION: No mammographic evidence of malignancy. A result letter of this screening mammogram will be mailed directly to the patient. RECOMMENDATION: Screening mammogram in one year. (Code:[id number]-[id number]) BI-RADS CATEGORY  1: Negative. Electronically Signed   By: Harmon Pier M.D.   On: 04/21/2023 12:13       Assessment & Plan:  Annual physical exam  Health care maintenance Assessment & Plan: Physical today 08/24/23.  Colonoscopy 01/2018.  Recommended f/u in 5 years.  Mammogram 04/19/23- [facility] I.  Bone density 03/2021 - osteopenia.  Continue calcium, vitamin D and weight bearing exercise.     Hypercholesteremia Assessment & Plan: Did not tolerate repatha.  On crestor.  Not taking zetia. Did not tolerate.  Follow lipid panel.   Lab Results  Component Value Date   CHOL 174 08/22/2023   HDL 64.00 08/22/2023   LDLCALC 94 08/22/2023   LDLDIRECT 123.4 02/27/2013   TRIG 79.0 08/22/2023   CHOLHDL 3 08/22/2023     Orders: -     Lipid panel;  Future -     Hepatic function panel; Future  Hyperglycemia Assessment & Plan: Low carb diet and exercise.  Follow met b and a1c.  Lab Results  Component Value Date   HGBA1C 5.5 08/22/2023     Orders: -     Hemoglobin A1c; Future  Hypothyroidism, unspecified type Assessment & Plan: On thyroid replacement. Follow tsh.   Orders: -     TSH; Future  Stress Assessment & Plan: Overall appears to be handling things relatively well. Taking buspar prn.  Follow.    Numbness of finger Assessment & Plan: Noticed numbness third and fourth fingers (right). Has a history of cervical DDD. Desires further w/up. Schedule for NCS.   Orders: -     Ambulatory referral to Neurology  ILD (interstitial lung disease) [facility]) Assessment & Plan: Saw Dr Jayme Cloud 09/21/22 - PFTs show FEV1 of 1.78 L or 91% predicted, FVC 2.74 L or 104% predicted, FEV1/FVC of 65%, no bronchodilator response. Lung volumes are normal. Diffusion capacity mildly reduced. Consistent with mild obstructive airways disease and mild diffusion defect.CT chest high-resolution showed unchanged mild pulmonary fibrosis in a pattern that shows mostly, mild traction bronchiectasis and minimal subpleural bronchiolectasis. Recommended AirSupra. Had f/u 04/17/23 (Dr Jayme Cloud). Recommended f/u CT in 09/2023. Also recommended starting trelegy. Did not tolerate. Stopped 08/18/23. Has f/u CT scheduled 4/8 and  f/u with Dr Jayme Cloud 09/28/23.    History of colonic polyps Assessment & Plan: Colonoscopy 01/2018.  Per pt, f/u colonoscopy recommended in 5 years. Due f/u.    Gastroesophageal reflux disease with esophagitis without hemorrhage Assessment & Plan: EGD: 12/2021 - stomach biopsy - benign changes c/w fundic gland polyp. No increased acid reflux. PPI.    Bilateral carotid artery disease, unspecified type (HCC) Assessment & Plan: Carotid ultrasound - right: 40-59% and left:  1-39%.  Continue statin therapy.  Carotid ultrasound (04/2021) -  recommended f/u carotid ultrasound every other year. Due f/u.    Coronary artery disease involving native coronary artery of native heart without angina pectoris Assessment & Plan: Being followed by cardiology.  Continue risk factor modification. Continue crestor. No chest pain.    Aortic atherosclerosis (HCC) Assessment & Plan: Continue crestor.    Other orders -     busPIRone HCl; Take 1 tablet (5 mg total) by mouth daily as needed.  Dispense: 30 tablet; Refill: 0 -     Levothyroxine Sodium; Take 1 tablet (75 mcg total) by mouth every other day.  Dispense: 45 tablet; Refill: 3 -     Omeprazole; Take 1 capsule (40 mg total) by mouth daily.  Dispense: 90 capsule; Refill: 3 -     Rosuvastatin Calcium; Take 1 tablet (10 mg total) by mouth daily.  Dispense: 90 tablet; Refill: 3 -     Acyclovir; Take 1 tablet (800 mg total) by mouth 2 (two) times daily as needed.  Dispense: 60 tablet; Refill: 0     Dale Dry Prong, MD

## 2023-08-25 ENCOUNTER — Other Ambulatory Visit: Payer: Self-pay | Admitting: Internal Medicine

## 2023-08-25 NOTE — Telephone Encounter (Signed)
 I would say we can hold off on inhalers until she follows up with me.

## 2023-08-26 MED ORDER — LEVOTHYROXINE SODIUM 50 MCG PO TABS: 50.0000 ug | ORAL_TABLET | ORAL | 1 refills | Status: DC

## 2023-08-26 NOTE — Telephone Encounter (Signed)
 I had sent in rx for synthroid - alternating with every other day. (Two different prescriptions sent in). Please confirm this is how she is taking her synthroid.

## 2023-09-01 ENCOUNTER — Encounter: Payer: Self-pay | Admitting: Internal Medicine

## 2023-09-01 MED ORDER — ACYCLOVIR 800 MG PO TABS: 800.0000 mg | ORAL_TABLET | Freq: Two times a day (BID) | ORAL | 0 refills | Status: DC | PRN

## 2023-09-01 NOTE — Telephone Encounter (Signed)
 Notify Ms [name] that I sent in her acyclovir to use as she has been doing.  Also, let her know that someone will be contacting her with an appt date and time.

## 2023-09-03 ENCOUNTER — Encounter: Payer: Self-pay | Admitting: Internal Medicine

## 2023-09-03 NOTE — Assessment & Plan Note (Signed)
 Noticed numbness third and fourth fingers (right). Has a history of cervical DDD. Desires further w/up. Schedule for NCS.

## 2023-09-03 NOTE — Assessment & Plan Note (Signed)
 Being followed by cardiology.  Continue risk factor modification. Continue crestor. No chest pain.

## 2023-09-03 NOTE — Assessment & Plan Note (Signed)
 Low carb diet and exercise.  Follow met b and a1c.  Lab Results  Component Value Date   HGBA1C 5.5 08/22/2023

## 2023-09-03 NOTE — Assessment & Plan Note (Signed)
 Overall appears to be handling things relatively well. Taking buspar prn.  Follow.

## 2023-09-03 NOTE — Assessment & Plan Note (Signed)
 Did not tolerate repatha.  On crestor.  Not taking zetia. Did not tolerate.  Follow lipid panel.   Lab Results  Component Value Date   CHOL 174 08/22/2023   HDL 64.00 08/22/2023   LDLCALC 94 08/22/2023   LDLDIRECT 123.4 02/27/2013   TRIG 79.0 08/22/2023   CHOLHDL 3 08/22/2023

## 2023-09-03 NOTE — Assessment & Plan Note (Signed)
 On thyroid replacement.  Follow tsh.

## 2023-09-03 NOTE — Assessment & Plan Note (Signed)
 Colonoscopy 01/2018.  Per pt, f/u colonoscopy recommended in 5 years. Due f/u.

## 2023-09-03 NOTE — Assessment & Plan Note (Signed)
 Continue crestor

## 2023-09-03 NOTE — Assessment & Plan Note (Signed)
 Carotid ultrasound - right: 40-59% and left:  1-39%.  Continue statin therapy.  Carotid ultrasound (04/2021) - recommended f/u carotid ultrasound every other year. Due f/u.

## 2023-09-03 NOTE — Assessment & Plan Note (Signed)
 Saw Dr Jayme Cloud 09/21/22 - PFTs show FEV1 of 1.78 L or 91% predicted, FVC 2.74 L or 104% predicted, FEV1/FVC of 65%, no bronchodilator response. Lung volumes are normal. Diffusion capacity mildly reduced. Consistent with mild obstructive airways disease and mild diffusion defect.CT chest high-resolution showed unchanged mild pulmonary fibrosis in a pattern that shows mostly, mild traction bronchiectasis and minimal subpleural bronchiolectasis. Recommended AirSupra. Had f/u 04/17/23 (Dr Jayme Cloud). Recommended f/u CT in 09/2023. Also recommended starting trelegy. Did not tolerate. Stopped 08/18/23. Has f/u CT scheduled 4/8 and f/u with Dr Jayme Cloud 09/28/23.

## 2023-09-03 NOTE — Assessment & Plan Note (Signed)
 EGD: 12/2021 - stomach biopsy - benign changes c/w fundic gland polyp. No increased acid reflux. PPI.

## 2023-09-12 DIAGNOSIS — H0288B Meibomian gland dysfunction left eye, upper and lower eyelids: Secondary | ICD-10-CM | POA: Diagnosis not present

## 2023-09-12 DIAGNOSIS — H0288A Meibomian gland dysfunction right eye, upper and lower eyelids: Secondary | ICD-10-CM | POA: Diagnosis not present

## 2023-09-12 DIAGNOSIS — H1045 Other chronic allergic conjunctivitis: Secondary | ICD-10-CM | POA: Diagnosis not present

## 2023-09-19 ENCOUNTER — Ambulatory Visit
Admission: RE | Admit: 2023-09-19 | Discharge: 2023-09-19 | Disposition: A | Discharge status: Home / Self Care | Payer: Medicare HMO | Source: Ambulatory Visit | Attending: *Deleted | Admitting: *Deleted

## 2023-09-19 DIAGNOSIS — J984 Other disorders of lung: Secondary | ICD-10-CM | POA: Diagnosis not present

## 2023-09-19 DIAGNOSIS — J849 Interstitial pulmonary disease, unspecified: Secondary | ICD-10-CM | POA: Diagnosis not present

## 2023-09-19 DIAGNOSIS — R918 Other nonspecific abnormal finding of lung field: Secondary | ICD-10-CM | POA: Diagnosis not present

## 2023-09-19 DIAGNOSIS — J479 Bronchiectasis, uncomplicated: Secondary | ICD-10-CM | POA: Diagnosis not present

## 2023-09-28 ENCOUNTER — Encounter: Payer: Self-pay | Admitting: Pulmonary Disease

## 2023-09-28 ENCOUNTER — Ambulatory Visit: Payer: Medicare HMO | Admitting: Pulmonary Disease

## 2023-09-28 VITALS — BP 116/60 | HR 58 | Temp 97.7°F | Ht 62.0 in | Wt 145.0 lb

## 2023-09-28 DIAGNOSIS — J4520 Mild intermittent asthma, uncomplicated: Secondary | ICD-10-CM | POA: Diagnosis not present

## 2023-09-28 DIAGNOSIS — H0288A Meibomian gland dysfunction right eye, upper and lower eyelids: Secondary | ICD-10-CM | POA: Diagnosis not present

## 2023-09-28 DIAGNOSIS — H1045 Other chronic allergic conjunctivitis: Secondary | ICD-10-CM | POA: Diagnosis not present

## 2023-09-28 DIAGNOSIS — H0288B Meibomian gland dysfunction left eye, upper and lower eyelids: Secondary | ICD-10-CM | POA: Diagnosis not present

## 2023-09-28 DIAGNOSIS — R918 Other nonspecific abnormal finding of lung field: Secondary | ICD-10-CM | POA: Diagnosis not present

## 2023-09-28 DIAGNOSIS — J8410 Pulmonary fibrosis, unspecified: Secondary | ICD-10-CM | POA: Diagnosis not present

## 2023-09-28 MED ORDER — AIRSUPRA 90-80 MCG/ACT IN AERO: 2.0000 | INHALATION_SPRAY | Freq: Four times a day (QID) | RESPIRATORY_TRACT | 2 refills | Status: AC | PRN

## 2023-09-28 NOTE — Progress Notes (Signed)
 Subjective:    Patient ID: [name], female    DOB: 12-28-1945, 78 y.o.   MRN: [id number]  Patient Care Team: Dale Bridgetown, MD as PCP - General (Internal Medicine) Salena Saner, MD as Consulting Physician (Pulmonary Disease) Antonieta Iba, MD as Consulting Physician (Cardiology)  Chief Complaint  Patient presents with   Follow-up    Stopped 08/14/23 trelegy due to feeling more congestion. No breathing problems.     BACKGROUND/INTERVAL:[name] is a 78 year old remote former smoker with minimal tobacco exposure in the past who presents for follow-up on the issue of exertional shortness of breath, pulmonary nodules and ill characterized ILD.  She was last seen on 17 April 2023.  This is a scheduled visit.   HPI Discussed the use of AI scribe software for clinical note transcription with the patient, who gave verbal consent to proceed.  History of Present Illness   [name] is a 78 year old female with potential pulmonary fibrosis and mild reactive airways disease who presents for follow-up of her lung condition.  She has a history of respiratory issues, including a persistent cough in the past severe enough to raise concerns about rib fractures.This cough has now resolved.  She attributes some of her respiratory symptoms to past COVID-19 infections, having contracted the virus twice. Although she describes her breathing as generally good, she does note that occasionally she could use a rescue inhaler.  She mentions a significant allergic reaction to grasses, which exacerbates her respiratory symptoms. To mitigate exposure, she has been trying to stay indoors, especially during times when grass is being mowed.  She currently does not have a rescue inhaler but recalls being given Airsupra in the past, which she found effective. She is concerned about the cost and insurance coverage of her medications.  She was given a trial of Trelegy Ellipta at her last visit however  she did not tolerate this medication well and did not feel actually worse with it so she discontinued it.  She has a history of flu vaccination, noting a previous adverse reaction that led her to skip the flu shot one year, but she did receive it the following year. She is cautious about exposure to respiratory irritants, wearing a mask and protective glasses when outdoors.   Overall she looks well and feels well.  Close her latest high-resolution chest CT which by my review is essentially unchanged from the 14 September 2022 imaging.  It has not yet been reviewed by the radiologist.  I did go over the CT independently and also showed the patient.  The ILD changes are very minimal, lung nodules are unchanged.    DATA 09/13/2022 PFTs:FEV1 of 1.78 L or 91% predicted, FVC 2.74 L or 104% predicted, FEV1/FVC of 65%, no bronchodilator response. Lung volumes are normal. Diffusion capacity mildly reduced. Consistent with mild obstructive airways disease and mild diffusion defect.  09/14/2022 high-resolution chest KG:[id number] mild pulmonary fibrosis in a pattern that shows mostly, mild traction bronchiectasis and minimal subpleural bronchiolectasis.  Findings could mean probable UIP small bilateral pulmonary nodules measuring 5 mm or less in diameter.   Review of Systems A 10 point review of systems was performed and it is as noted above otherwise negative.   Patient Active Problem List   Diagnosis Date Noted   Rectocele 07/10/2023   AB (asthmatic bronchitis), mild persistent, uncomplicated 04/17/2023   ILD (interstitial lung disease) (HCC) 04/17/2023   Osteoarthritis of foot 05/13/2022   CAD (coronary artery disease) 05/03/2022  History of COVID-19 02/10/2022   Pain in both feet 10/31/2021   Allergic rhinitis 10/29/2021   Joint pain 06/20/2021   TMJ arthralgia 06/20/2021   Numbness of finger 06/20/2021   Carotid artery disease (HCC) 04/21/2021   Left carotid bruit 03/03/2021   Aortic  atherosclerosis (HCC) 10/21/2020   Hyperglycemia 04/12/2020   Change in bowel habits 01/16/2020   Hot flashes 09/23/2019   Left hip pain 02/19/2019   Axillary fullness 02/19/2019   Lymphadenopathy 10/17/2018   Neck pain 04/29/2018   Swelling of right hand 12/24/2017   Loose stools 08/02/2015   Health care maintenance 01/29/2015   Right foot pain 02/17/2014   History of colonic polyps 05/07/2013   Stress 02/27/2013   Hypercholesteremia 03/26/2012   Hypothyroidism 03/26/2012   GERD (gastroesophageal reflux disease) 03/26/2012    Social History   Tobacco Use   Smoking status: Former    Current packs/day: 0.25    Average packs/day: 0.3 packs/day for 5.0 years (1.3 ttl pk-yrs)    Types: Cigarettes   Smokeless tobacco: Never   Tobacco comments:    Smoked a pack a week in her early 20's. Smoked for 5 years.  Substance Use Topics   Alcohol use: Never    Comment: rare    Allergies  Allergen Reactions   Paxlovid [Nirmatrelvir-Ritonavir] Other (See Comments)    Severe sore throat   Diclofenac Nausea Only   Ibuprofen Other (See Comments)    Stomach issues   Tramadol Nausea Only   Hydromorphone Rash   Oxycodone Other (See Comments)    constipation    Current Meds  Medication Sig   acetaminophen (TYLENOL) 500 MG tablet Take 500 mg by mouth every 6 (six) hours as needed.   acyclovir (ZOVIRAX) 800 MG tablet Take 1 tablet (800 mg total) by mouth 2 (two) times daily as needed.   Albuterol-Budesonide (AIRSUPRA) 90-80 MCG/ACT AERO Inhale 2 puffs into the lungs every 6 (six) hours as needed (Shortness of breath, wheezing, cough).   Ascorbic Acid (VITAMIN C) 500 MG CAPS Take 500 mg by mouth daily.   B Complex Vitamins (VITAMIN B-COMPLEX) TABS Take 1 tablet by mouth daily.   busPIRone (BUSPAR) 5 MG tablet Take 1 tablet (5 mg total) by mouth daily as needed.   Cholecalciferol (VITAMIN D PO) Take 10,000 Units by mouth daily.    Coenzyme Q10 (COQ-10 PO) Take 120 mg by mouth daily.    COLLAGEN PO Take 11 mg by mouth daily.   Hypertonic Nasal Wash (SINUS RINSE NA) Place into the nose as needed.   levothyroxine (SYNTHROID) 50 MCG tablet Take 1 tablet (50 mcg total) by mouth every other day.   levothyroxine (SYNTHROID) 75 MCG tablet Take 1 tablet (75 mcg total) by mouth every other day.   Omega-3 Fatty Acids (FISH OIL PO) Take 1,200 mg by mouth daily.   omeprazole (PRILOSEC) 40 MG capsule Take 1 capsule (40 mg total) by mouth daily.   Probiotic Product (PROBIOTIC DAILY PO) Take 1 capsule by mouth.   rosuvastatin (CRESTOR) 10 MG tablet Take 1 tablet (10 mg total) by mouth daily.   sodium chloride irrigation 0.9 % irrigation sodium chloride 0.9 % irrigation solution   UNABLE TO FIND Take 1 Capful by mouth daily. Mega B-50   vitamin E 400 UNIT capsule Take 400 Units by mouth daily.    Immunization History  Administered Date(s) Administered   Fluad Quad(high Dose 65+) 02/19/2019, 04/07/2020, 02/22/2021   Influenza Split 03/26/2012   Influenza, High Dose  Seasonal PF 03/07/2017, 03/09/2018, 03/18/2023   Influenza,inj,Quad PF,6+ Mos 02/25/2013, 02/14/2014, 03/26/2015, 02/02/2016   Influenza,inj,quad, With Preservative 03/13/2017, 03/13/2018   Influenza-Unspecified 03/18/2023   PFIZER(Purple Top)SARS-COV-2 Vaccination 10/18/2019, 11/12/2019   Pneumococcal Conjugate-13 04/22/2015   Pneumococcal Polysaccharide-23 01/24/2011, 06/14/2015   Tdap 11/17/2015        Objective:     BP 116/60 (BP Location: Left Arm, Patient Position: Sitting, Cuff Size: Normal)   Pulse (!) 58   Temp 97.7 F (36.5 C) (Temporal)   Ht 5\' 2"  (1.575 m)   Wt 145 lb (65.8 kg)   SpO2 97%   BMI 26.52 kg/m   SpO2: 97 %  GENERAL: Well-developed, well-nourished woman, no acute distress, fully ambulatory, no conversational dyspnea. HEAD: Normocephalic, atraumatic.  EYES: Pupils equal, round, reactive to light.  No scleral icterus.  MOUTH: Dentition intact, oral mucosa moist. NECK: Supple. No  thyromegaly. Trachea midline. No JVD.  No adenopathy. PULMONARY: Good air entry bilaterally.  Coarse, otherwise, no adventitious sounds. CARDIOVASCULAR: S1 and S2. Regular rate and rhythm.  No rubs, murmurs or gallops heard. ABDOMEN: Benign. MUSCULOSKELETAL: No joint deformity, no clubbing, no edema.  NEUROLOGIC: No overt focal deficit, no gait disturbance, speech is fluent. SKIN: Intact,warm,dry. PSYCH: Mood and behavior normal.      Assessment & Plan:     ICD-10-CM   1. Mild intermittent asthma without complication  J45.20     2. Postinflammatory pulmonary fibrosis  J84.10     3. Multiple lung nodules  R91.8      Meds ordered this encounter  Medications   Albuterol-Budesonide (AIRSUPRA) 90-80 MCG/ACT AERO    Sig: Inhale 2 puffs into the lungs every 6 (six) hours as needed (Shortness of breath, wheezing, cough).    Dispense:  10.7 g    Refill:  2   Discussed:    Mild intermittent asthma She exhibits mild obstructive airways disease, likely related to asthma, with symptoms exacerbated by allergies, particularly to grasses. Significant respiratory issues in the past, including severe coughing episodes, may be related to COVID-19 and flu infections. She currently lacks a rescue inhaler.  She noted that AirSupra has worked well for her in the past. - Prescribe a rescue inhaler (AirSupra) for use as needed for congestion or respiratory distress. - Advise her to contact the office if the prescribed inhaler is not covered by insurance or is too expensive, so an alternative can be provided.  Pulmonary fibrosis CT scan shows minimal lung scarring, likely from past COVID-19 infection.  This is postinflammatory pulmonary fibrosis.  The scarring is not expected to progress, and changes are minimal. Awaiting radiologist review, but no significant issues anticipated. - No need to repeat the CT scan unless changes are noted by the radiologist.  Follow-up She is under the care of Dr. Geralyn Knee  for regular follow-ups, with good communication and recent favorable blood work results. Follow-up is scheduled in one year, or as needed, based on her condition and Dr. Thayne Fine recommendations. - Schedule a follow-up appointment in one year, or as needed, based on her condition and Dr. Thayne Fine recommendations. - Instruct her to call if she needs to see the provider before the scheduled follow-up.      Advised if symptoms do not improve or worsen, to please contact office for sooner follow up or seek emergency care.    I spent 30 minutes of dedicated to the care of this patient on the date of this encounter to include pre-visit review of records, face-to-face time with the  patient discussing conditions above, post visit ordering of testing, clinical documentation with the electronic health record, making appropriate referrals as documented, and communicating necessary findings to members of the patients care team.     C. Chloe Counter, MD Advanced Bronchoscopy [facility]    *This note was generated using voice recognition software/Dragon and/or AI transcription program.  Despite best efforts to proofread, errors can occur which can change the meaning. Any transcriptional errors that result from this process are unintentional and may not be fully corrected at the time of dictation.

## 2023-09-28 NOTE — Patient Instructions (Signed)
 VISIT SUMMARY:  Today, we discussed your lung condition, including your history of respiratory issues and recent changes in your breathing test results. We reviewed your allergic reactions, past COVID-19 infections, and medication history. We also talked about your concerns regarding medication costs and insurance coverage.  YOUR PLAN:  -MILD INTERMITTENT ASTHMA: You have mild asthma, which means your airways can become inflamed and narrow at times, making it harder to breathe. This condition is worsened by allergies, especially to bahia grass. We will prescribe a rescue inhaler (AirSupra) for you to use as needed when you experience congestion or difficulty breathing. If the inhaler is not covered by your insurance or is too expensive, please contact our office so we can find an alternative for you.  -PULMONARY FIBROSIS: Pulmonary fibrosis is a condition where lung tissue becomes scarred. Your CT scan shows minimal scarring, likely from past COVID-19 infections, and it is not expected to get worse. We are waiting for the radiologist's review, but no significant issues are anticipated. There is no need to repeat the CT scan unless the radiologist notes changes.  He also have a few nodules that have remained stable and do not warrant further follow-up.  INSTRUCTIONS:  Please schedule a follow-up appointment as needed, based on your condition and Dr. Thayne Fine recommendations. Call our office if you need to see the provider before the scheduled follow-up.

## 2023-10-05 DIAGNOSIS — H0015 Chalazion left lower eyelid: Secondary | ICD-10-CM | POA: Diagnosis not present

## 2023-10-11 DIAGNOSIS — L570 Actinic keratosis: Secondary | ICD-10-CM | POA: Diagnosis not present

## 2023-10-11 DIAGNOSIS — C44622 Squamous cell carcinoma of skin of right upper limb, including shoulder: Secondary | ICD-10-CM | POA: Diagnosis not present

## 2023-10-11 DIAGNOSIS — D3615 Benign neoplasm of peripheral nerves and autonomic nervous system of abdomen: Secondary | ICD-10-CM | POA: Diagnosis not present

## 2023-10-11 DIAGNOSIS — Q828 Other specified congenital malformations of skin: Secondary | ICD-10-CM | POA: Diagnosis not present

## 2023-10-11 DIAGNOSIS — D1801 Hemangioma of skin and subcutaneous tissue: Secondary | ICD-10-CM | POA: Diagnosis not present

## 2023-10-11 DIAGNOSIS — D2221 Melanocytic nevi of right ear and external auricular canal: Secondary | ICD-10-CM | POA: Diagnosis not present

## 2023-10-11 DIAGNOSIS — Z85828 Personal history of other malignant neoplasm of skin: Secondary | ICD-10-CM | POA: Diagnosis not present

## 2023-10-11 DIAGNOSIS — D485 Neoplasm of uncertain behavior of skin: Secondary | ICD-10-CM | POA: Diagnosis not present

## 2023-10-11 DIAGNOSIS — L821 Other seborrheic keratosis: Secondary | ICD-10-CM | POA: Diagnosis not present

## 2023-10-11 DIAGNOSIS — L814 Other melanin hyperpigmentation: Secondary | ICD-10-CM | POA: Diagnosis not present

## 2023-10-11 DIAGNOSIS — L820 Inflamed seborrheic keratosis: Secondary | ICD-10-CM | POA: Diagnosis not present

## 2023-10-12 ENCOUNTER — Ambulatory Visit: Payer: HMO | Attending: Internal Medicine | Admitting: Physical Therapy

## 2023-10-12 DIAGNOSIS — N816 Rectocele: Secondary | ICD-10-CM | POA: Diagnosis not present

## 2023-10-12 DIAGNOSIS — M5459 Other low back pain: Secondary | ICD-10-CM | POA: Diagnosis present

## 2023-10-12 DIAGNOSIS — R2689 Other abnormalities of gait and mobility: Secondary | ICD-10-CM | POA: Diagnosis not present

## 2023-10-12 DIAGNOSIS — M542 Cervicalgia: Secondary | ICD-10-CM | POA: Diagnosis present

## 2023-10-12 DIAGNOSIS — M533 Sacrococcygeal disorders, not elsewhere classified: Secondary | ICD-10-CM | POA: Diagnosis present

## 2023-10-12 DIAGNOSIS — M79672 Pain in left foot: Secondary | ICD-10-CM | POA: Diagnosis present

## 2023-10-12 DIAGNOSIS — M79671 Pain in right foot: Secondary | ICD-10-CM

## 2023-10-12 NOTE — Therapy (Deleted)
 OUTPATIENT PHYSICAL THERAPY EVALUATION   Patient Name: [name] MRN: [id number] DOB:August 04, 1945, 78 y.o., female Today's Date: 10/12/2023   PT End of Session - 10/12/23 1037     Visit Number 1    Number of Visits 10    Date for PT Re-Evaluation 12/21/23    PT Start Time 1020    PT Stop Time 1100    PT Time Calculation (min) 40 min    Equipment Utilized During Treatment Gait belt    Behavior During Therapy WFL for tasks assessed/performed             Past Medical History:  Diagnosis Date   Arthritis    Environmental allergies    Esophagitis    Barrets, followed by Dr Rodney Clamp   GERD (gastroesophageal reflux disease)    Hypercholesterolemia    Hypothyroidism    Past Surgical History:  Procedure Laterality Date   ANKLE SURGERY  06/13/1965   cataract surgery Right 02/2023   cataract surgery Left 02/2023   KNEE ARTHROSCOPY  12/03/2007   left   KNEE ARTHROSCOPY  06/25/2008   left   NASAL SINUS SURGERY  05/13/2005   REPLACEMENT TOTAL KNEE  01/04/2009   left   RHINOPLASTY  06/13/1984   VAGINAL HYSTERECTOMY  06/13/1978   ovaries left in place   WRIST SURGERY  06/13/1968   cyst removal   Patient Active Problem List   Diagnosis Date Noted   Rectocele 07/10/2023   AB (asthmatic bronchitis), mild persistent, uncomplicated 04/17/2023   ILD (interstitial lung disease) (HCC) 04/17/2023   Osteoarthritis of foot 05/13/2022   CAD (coronary artery disease) 05/03/2022   History of COVID-19 02/10/2022   Pain in both feet 10/31/2021   Allergic rhinitis 10/29/2021   Joint pain 06/20/2021   TMJ arthralgia 06/20/2021   Numbness of finger 06/20/2021   Carotid artery disease (HCC) 04/21/2021   Left carotid bruit 03/03/2021   Aortic atherosclerosis (HCC) 10/21/2020   Hyperglycemia 04/12/2020   Change in bowel habits 01/16/2020   Hot flashes 09/23/2019   Left hip pain 02/19/2019   Axillary fullness 02/19/2019   Lymphadenopathy 10/17/2018   Neck pain 04/29/2018    Swelling of right hand 12/24/2017   Loose stools 08/02/2015   Health care maintenance 01/29/2015   Right foot pain 02/17/2014   History of colonic polyps 05/07/2013   Stress 02/27/2013   Hypercholesteremia 03/26/2012   Hypothyroidism 03/26/2012   GERD (gastroesophageal reflux disease) 03/26/2012    PCP: Dellar Fenton MD   REFERRING PROVIDER: : Dellar Fenton MD   REFERRING DIAG: rectocele   Rationale for Evaluation and Treatment Rehabilitation  THERAPY DIAG:    ONSET DATE: ***  SUBJECTIVE:  SUBJECTIVE STATEMENT: ***  PERTINENT HISTORY:  ***  PAIN:  Are you having pain? Yes: {yespain:27235::"NPRS scale: ***","Pain location: ***","Pain description: ***","Aggravating factors: ***","Relieving factors: ***"}  PRECAUTIONS: {Therapy precautions:24002}  WEIGHT BEARING RESTRICTIONS: {Yes ***/No:24003}  FALLS:  Has patient fallen in last 6 months? {fallsyesno:27318}  LIVING ENVIRONMENT: Lives with: {[facility] lives with:25569::"lives with their family"} Lives in: {Lives in:25570} Stairs: {opstairs:27293} Has following equipment at home: {Assistive devices:23999}  OCCUPATION: ***  PLOF: {PLOF:24004}  PATIENT GOALS: ***   OBJECTIVE:      HOME EXERCISE PROGRAM: See pt instruction section    ASSESSMENT:  CLINICAL IMPRESSION: **   Pt benefits from skilled PT.    OBJECTIVE IMPAIRMENTS decreased activity tolerance, decreased coordination, decreased endurance, decreased mobility, difficulty walking, decreased ROM, decreased strength, decreased safety awareness, hypomobility, increased muscle spasms, impaired flexibility, improper body mechanics, postural dysfunction, and ***pain. ***scar restrictions   ACTIVITY LIMITATIONS  self-care,  ***sleep, home chores, work tasks     PARTICIPATION LIMITATIONS:  community, ***gym activities    PERSONAL FACTORS   ***     are also affecting patient's functional outcome.    REHAB POTENTIAL: Good   CLINICAL DECISION MAKING: Evolving/moderate complexity   EVALUATION COMPLEXITY: Moderate    PATIENT EDUCATION:    Education details: Showed pt anatomy images. Explained muscles attachments/ connection, physiology of deep core system/ spinal- thoracic-pelvis-lower kinetic chain as they relate to pt's presentation, Sx, and past Hx. Explained what and how these areas of deficits need to be restored to balance and function    See Therapeutic activity / neuromuscular re-education section  Answered pt's questions.   Person educated: Patient Education method: Explanation, Demonstration, Tactile cues, Verbal cues, and Handouts Education comprehension: verbalized understanding, returned demonstration, verbal cues required, tactile cues required, and needs further education     PLAN: PT FREQUENCY: 1x/week   PT DURATION: 10 weeks   PLANNED INTERVENTIONS: Therapeutic exercises, Therapeutic activity, Neuromuscular re-education, Balance training, Gait training, Patient/Family education, Self Care, Joint mobilization, Spinal mobilization, Moist heat, Taping, and Manual therapy, dry needling.   PLAN FOR NEXT SESSION: See clinical impression for plan     GOALS: Goals reviewed with patient? Yes  SHORT TERM GOALS: Target date: 11/09/2023    Pt will demo IND with HEP                    Baseline: Not IND            Goal status: INITIAL   LONG TERM GOALS: Target date: 12/21/2023    1.Pt will demo proper deep core coordination without chest breathing and optimal excursion of diaphragm/pelvic floor in order to promote spinal stability and pelvic floor function  Baseline: dyscoordination Goal status: INITIAL  2.  Pt will demo proper body mechanics in against gravity tasks and ADLs  work tasks, fitness  to minimize straining  pelvic floor / back    Baseline: not IND, improper form that places strain on pelvic floor  Goal status: INITIAL    3. Pt will demo increased gait speed > 1.3 m/s with reciprocal gait pattern, longer stride length  in order to ambulate safely in community and return to fitness routine  Baseline:  Goal status: INITIAL    4.  Baseline:  Goal status: INITIAL   5.  Baseline:  Goal status: INITIAL     Modesto Andreas, PT 10/12/2023, 10:45 AM

## 2023-10-12 NOTE — Patient Instructions (Addendum)
 ?  Proper body mechanics with getting out of a chair to decrease strain  ?on back &pelvic floor  ? ?Avoid holding your breath when ?Getting out of the chair: ? ?Scoot to front part of chair chair ?Heels behind knees, feet are hip width apart, nose over toes  ?Inhale like you are smelling roses ?Exhale to stand  ? ? ?
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
## 2023-10-16 ENCOUNTER — Telehealth (INDEPENDENT_AMBULATORY_CARE_PROVIDER_SITE_OTHER): Admitting: Internal Medicine

## 2023-10-16 ENCOUNTER — Encounter: Payer: Self-pay | Admitting: Internal Medicine

## 2023-10-16 VITALS — BP 104/60 | HR 66 | Ht 62.0 in | Wt 145.1 lb

## 2023-10-16 DIAGNOSIS — K2100 Gastro-esophageal reflux disease with esophagitis, without bleeding: Secondary | ICD-10-CM | POA: Diagnosis not present

## 2023-10-16 DIAGNOSIS — R053 Chronic cough: Secondary | ICD-10-CM | POA: Insufficient documentation

## 2023-10-16 DIAGNOSIS — R051 Acute cough: Secondary | ICD-10-CM | POA: Diagnosis not present

## 2023-10-16 DIAGNOSIS — R059 Cough, unspecified: Secondary | ICD-10-CM | POA: Insufficient documentation

## 2023-10-16 DIAGNOSIS — J8410 Pulmonary fibrosis, unspecified: Secondary | ICD-10-CM | POA: Diagnosis not present

## 2023-10-16 DIAGNOSIS — J4530 Mild persistent asthma, uncomplicated: Secondary | ICD-10-CM

## 2023-10-16 MED ORDER — AMOXICILLIN-POT CLAVULANATE 875-125 MG PO TABS: 1.0000 | ORAL_TABLET | Freq: Two times a day (BID) | ORAL | 0 refills | Status: DC

## 2023-10-16 NOTE — Assessment & Plan Note (Signed)
 Had recent f/u with Dr Viva Grise. Off trelegy.

## 2023-10-16 NOTE — Assessment & Plan Note (Signed)
 EGD: 12/2021 - stomach biopsy - benign changes c/w fundic gland polyp. No increased acid reflux. PPI.

## 2023-10-16 NOTE — Assessment & Plan Note (Signed)
 Saw Dr Viva Grise. CT scan shows minimal lung scarring, likely from past COVID-19 infection.  This is postinflammatory pulmonary fibrosis.  The scarring is not expected to progress, and changes are minimal. Awaiting radiologist review, but no significant issues anticipated. - No need to repeat the CT scan unless changes are noted by the radiologist.

## 2023-10-16 NOTE — Progress Notes (Signed)
 Patient ID: [name], female   DOB: 1945/07/13, 78 y.o.   MRN: [id number]   Virtual Visit via video Note  I connected with Nohemi Batters by a video enabled telemedicine application and verified that I am speaking with the correct person using two identifiers. Location patient: home Location provider: work  Persons participating in the virtual visit: patient, provider  The limitations, risks, security and privacy concerns of performing an evaluation and management service by video and the availability of in person appointments have been discussed. It has also been discussed with the patient that there may be a patient responsible charge related to this service. The patient expressed understanding and agreed to proceed.   Reason for visit: work in appt  HPI: Work in appt with concerns regarding increased congestion and cough. Reports that symptoms started 6 days ago. Has been having increased nasal congestion and head congestion. No sore throat. Dripping nose. Increased cough. No fever. Yellow mucus production. Decreased appetite. No vomiting. Not sleeping. Tried melatonin. Has been using saline nasal spray. Taking mucinex . Persistent/worsening symptoms. No increased sob.    ROS: See pertinent positives and negatives per HPI.  Past Medical History:  Diagnosis Date   Arthritis    Environmental allergies    Esophagitis    Barrets, followed by Dr Rodney Clamp   GERD (gastroesophageal reflux disease)    Hypercholesterolemia    Hypothyroidism     Past Surgical History:  Procedure Laterality Date   ANKLE SURGERY  06/13/1965   cataract surgery Right 02/2023   cataract surgery Left 02/2023   KNEE ARTHROSCOPY  12/03/2007   left   KNEE ARTHROSCOPY  06/25/2008   left   NASAL SINUS SURGERY  05/13/2005   REPLACEMENT TOTAL KNEE  01/04/2009   left   RHINOPLASTY  06/13/1984   VAGINAL HYSTERECTOMY  06/13/1978   ovaries left in place   WRIST SURGERY  06/13/1968   cyst removal     Family History  Problem Relation Age of Onset   Pulmonary disease Mother    Heart disease Father        heart attack   Diabetes Father    Multiple myeloma Brother    Breast cancer Maternal Aunt    Breast cancer Maternal Aunt    Prostate cancer Maternal Uncle    Hyperlipidemia Paternal Grandmother    Diabetes Paternal Grandmother    Stroke Paternal Grandmother    Hyperlipidemia Paternal Grandfather    Diabetes Paternal Grandfather    Pancreatic cancer Cousin     SOCIAL HX: reviewed.    Current Outpatient Medications:    acetaminophen (TYLENOL) 500 MG tablet, Take 500 mg by mouth every 6 (six) hours as needed., Disp: , Rfl:    acyclovir  (ZOVIRAX ) 800 MG tablet, Take 1 tablet (800 mg total) by mouth 2 (two) times daily as needed., Disp: 60 tablet, Rfl: 0   Albuterol -Budesonide (AIRSUPRA ) 90-80 MCG/ACT AERO, Inhale 2 puffs into the lungs every 6 (six) hours as needed (Shortness of breath, wheezing, cough)., Disp: 10.7 g, Rfl: 2   amoxicillin -clavulanate (AUGMENTIN ) 875-125 MG tablet, Take 1 tablet by mouth 2 (two) times daily., Disp: 14 tablet, Rfl: 0   Ascorbic Acid (VITAMIN C) 500 MG CAPS, Take 500 mg by mouth daily., Disp: , Rfl:    B Complex Vitamins (VITAMIN B-COMPLEX) TABS, Take 1 tablet by mouth daily., Disp: , Rfl:    busPIRone  (BUSPAR ) 5 MG tablet, Take 1 tablet (5 mg total) by mouth daily as needed., Disp: 30 tablet,  Rfl: 0   Cholecalciferol (VITAMIN D PO), Take 10,000 Units by mouth daily. , Disp: , Rfl:    Coenzyme Q10 (COQ-10 PO), Take 120 mg by mouth daily., Disp: , Rfl:    COLLAGEN PO, Take 11 mg by mouth daily., Disp: , Rfl:    Hypertonic Nasal Wash (SINUS RINSE NA), Place into the nose as needed., Disp: , Rfl:    levothyroxine  (SYNTHROID ) 50 MCG tablet, Take 1 tablet (50 mcg total) by mouth every other day., Disp: 45 tablet, Rfl: 1   levothyroxine  (SYNTHROID ) 75 MCG tablet, Take 1 tablet (75 mcg total) by mouth every other day., Disp: 45 tablet, Rfl: 3   Omega-3  Fatty Acids (FISH OIL PO), Take 1,200 mg by mouth daily., Disp: , Rfl:    omeprazole  (PRILOSEC) 40 MG capsule, Take 1 capsule (40 mg total) by mouth daily., Disp: 90 capsule, Rfl: 3   Probiotic Product (PROBIOTIC DAILY PO), Take 1 capsule by mouth., Disp: , Rfl:    rosuvastatin  (CRESTOR ) 10 MG tablet, Take 1 tablet (10 mg total) by mouth daily., Disp: 90 tablet, Rfl: 3   sodium chloride  irrigation 0.9 % irrigation, sodium chloride  0.9 % irrigation solution, Disp: , Rfl:    UNABLE TO FIND, Take 1 Capful by mouth daily. Mega B-50, Disp: , Rfl:    vitamin E 400 UNIT capsule, Take 400 Units by mouth daily., Disp: , Rfl:   EXAM:  GENERAL: alert, oriented, appears well and in no acute distress  HEENT: atraumatic, conjunttiva clear, no obvious abnormalities on inspection of external nose and ears  NECK: normal movements of the head and neck  LUNGS: on inspection no signs of respiratory distress, breathing rate appears normal, no obvious gross SOB, gasping or wheezing  CV: no obvious cyanosis  PSYCH/NEURO: pleasant and cooperative, no obvious depression or anxiety, speech and thought processing grossly intact  ASSESSMENT AND PLAN:  Discussed the following assessment and plan:  Problem List Items Addressed This Visit     AB (asthmatic bronchitis), mild persistent, uncomplicated - Primary   Had recent f/u with Dr Viva Grise. Off trelegy.       Cough   Increased cough and congestion as outlined. Nasal and head congestion as outlined. Worsening symptoms. Continue mucinex  and saline nasal spray. Given progression of symptoms despite above, will treat with augmentin  as directed. Follow.  Call with update. Stay hydrated.       GERD (gastroesophageal reflux disease)   EGD: 12/2021 - stomach biopsy - benign changes c/w fundic gland polyp. No increased acid reflux. PPI.       Pulmonary fibrosis (HCC)   Saw Dr Viva Grise. CT scan shows minimal lung scarring, likely from past COVID-19 infection.   This is postinflammatory pulmonary fibrosis.  The scarring is not expected to progress, and changes are minimal. Awaiting radiologist review, but no significant issues anticipated. - No need to repeat the CT scan unless changes are noted by the radiologist.       Return if symptoms worsen or fail to improve.   I discussed the assessment and treatment plan with the patient. The patient was provided an opportunity to ask questions and all were answered. The patient agreed with the plan and demonstrated an understanding of the instructions.   The patient was advised to call back or seek an in-person evaluation if the symptoms worsen or if the condition fails to improve as anticipated.   Dellar Fenton, MD

## 2023-10-16 NOTE — Assessment & Plan Note (Signed)
 Increased cough and congestion as outlined. Nasal and head congestion as outlined. Worsening symptoms. Continue mucinex  and saline nasal spray. Given progression of symptoms despite above, will treat with augmentin  as directed. Follow.  Call with update. Stay hydrated.

## 2023-10-17 ENCOUNTER — Encounter: Payer: Self-pay | Admitting: Internal Medicine

## 2023-10-17 NOTE — Telephone Encounter (Signed)
 Spoke with pt and informed her of the message below. Pt wanted to know if she should take the mucinex  and the cough syrup. I spoke with Dr. Geralyn Knee and she said that was as long as the pt was able to keep it down. Pt stated that she is not throwing up she just has diarrhea. Pt was advised to make sure she stays hydrated and drinks the Pedialyte as well as continues the probiotic and if symptoms worsened she needs to go to the ED to be evaluated. Pt gave a verbal understanding.

## 2023-10-17 NOTE — Telephone Encounter (Signed)
 Spoke to pt. Pt stated that she has took 2 pills of the antibiotic. She stated that she has had diarrhea but with the meds it increased. Pt stated she experienced some nausea but didn't actually vomit. Pt states she is currently week and loss her appetite. Pt current vitals temp: 98.1 bp was 115/62 and pulse was 80.  Pt is drinking water and Pedialyte as well as taking a probiotic. Pt denies any chest pain, vomiting, stomach pain

## 2023-10-17 NOTE — Telephone Encounter (Signed)
 Remain off antibiotic tonight. Stay hydrated. Pedialite. If worsening symptoms this pm - needs to be evaluated. If improving, call in am with update.

## 2023-10-18 ENCOUNTER — Encounter: Payer: Self-pay | Admitting: Internal Medicine

## 2023-10-18 NOTE — Telephone Encounter (Signed)
 LM for patient. Please give her message below when she returns call.

## 2023-10-18 NOTE — Telephone Encounter (Signed)
 Please call her and let her know to continue mucinex . Also, make sure she is using flonase  or nasacort  nasal spray. Also, can use afrin nasal spray 2 sprays each nostril 2x/day for three days only then stop.  Remain off abx for now. Keep hydrated. Continue pedialite and kaopectate if needed.  Bland foods.  Need to call back with update. Any worsening change, she will need to be evaluated.

## 2023-10-19 ENCOUNTER — Ambulatory Visit: Payer: HMO | Admitting: Physical Therapy

## 2023-10-20 ENCOUNTER — Encounter: Payer: Self-pay | Admitting: Internal Medicine

## 2023-10-23 NOTE — Telephone Encounter (Signed)
 I am glad she is starting to feel better. Continue to stay hydrated. Continue medication as previously discussed. Have her recheck her oxygen level. Confirm no sob?  If persistent symptoms, can schedule an in person visit to follow up.

## 2023-10-23 NOTE — Telephone Encounter (Signed)
 Repeat O2 sat 97%. No sob. Pt scheduled for appt for lingering congestion

## 2023-10-23 NOTE — Telephone Encounter (Signed)
 See other note

## 2023-10-25 ENCOUNTER — Ambulatory Visit (INDEPENDENT_AMBULATORY_CARE_PROVIDER_SITE_OTHER)

## 2023-10-25 ENCOUNTER — Encounter: Payer: Self-pay | Admitting: Internal Medicine

## 2023-10-25 ENCOUNTER — Ambulatory Visit: Admitting: Internal Medicine

## 2023-10-25 ENCOUNTER — Ambulatory Visit: Payer: Self-pay | Admitting: Internal Medicine

## 2023-10-25 VITALS — BP 118/70 | HR 60 | Temp 98.0°F | Resp 16 | Ht 62.0 in | Wt 139.0 lb

## 2023-10-25 DIAGNOSIS — R053 Chronic cough: Secondary | ICD-10-CM

## 2023-10-25 DIAGNOSIS — R195 Other fecal abnormalities: Secondary | ICD-10-CM | POA: Diagnosis not present

## 2023-10-25 DIAGNOSIS — I2510 Atherosclerotic heart disease of native coronary artery without angina pectoris: Secondary | ICD-10-CM

## 2023-10-25 DIAGNOSIS — R062 Wheezing: Secondary | ICD-10-CM | POA: Diagnosis not present

## 2023-10-25 DIAGNOSIS — K2100 Gastro-esophageal reflux disease with esophagitis, without bleeding: Secondary | ICD-10-CM

## 2023-10-25 LAB — BASIC METABOLIC PANEL WITH GFR
BUN: 14 mg/dL (ref 6–23)
CO2: 27 meq/L (ref 19–32)
Calcium: 9.3 mg/dL (ref 8.4–10.5)
Chloride: 105 meq/L (ref 96–112)
Creatinine, Ser: 0.71 mg/dL (ref 0.40–1.20)
GFR: 81.32 mL/min
Glucose, Bld: 74 mg/dL (ref 70–99)
Potassium: 3.9 meq/L (ref 3.5–5.1)
Sodium: 139 meq/L (ref 135–145)

## 2023-10-25 MED ORDER — BUSPIRONE HCL 5 MG PO TABS: 5.0000 mg | ORAL_TABLET | Freq: Every day | ORAL | 0 refills | Status: DC | PRN

## 2023-10-25 MED ORDER — LEVOTHYROXINE SODIUM 50 MCG PO TABS: 50.0000 ug | ORAL_TABLET | ORAL | 1 refills | Status: DC

## 2023-10-25 MED ORDER — PREDNISONE 10 MG PO TABS: ORAL_TABLET | ORAL | 0 refills | Status: DC

## 2023-10-25 MED ORDER — AZITHROMYCIN 250 MG PO TABS: ORAL_TABLET | ORAL | 0 refills | Status: AC

## 2023-10-25 NOTE — Progress Notes (Signed)
 Subjective:    Patient ID: [name], female    DOB: 06-18-1945, 78 y.o.   MRN: [id number]  Patient here for  Chief Complaint  Patient presents with   Cough   LINGERING CONGESTION    HPI Work in appt.  Work in with persistent cough and congestion. Evaluated on 10/16/23 - with cough and congestoin. Also with nasal and head congestion. Was prescribed augmentin  and instructed to continue mucinex  and saline nasal spray. Had increased diarrhea, so augmentin  was stopped. She reported feeling better, but still with persistent symptoms, including cough. Comes in today with no sinus pressure. Ears - stopped up - better. No sore throat. Still with chest congestion and increased cough - productive. Decreased appetite. She is eating and trying to stay hydrated. Takes kaopectate prn for her loose stool, but overall her bowels are better.    Past Medical History:  Diagnosis Date   Arthritis    Environmental allergies    Esophagitis    Barrets, followed by Dr Rodney Clamp   GERD (gastroesophageal reflux disease)    Hypercholesterolemia    Hypothyroidism    Past Surgical History:  Procedure Laterality Date   ANKLE SURGERY  06/13/1965   cataract surgery Right 02/2023   cataract surgery Left 02/2023   KNEE ARTHROSCOPY  12/03/2007   left   KNEE ARTHROSCOPY  06/25/2008   left   NASAL SINUS SURGERY  05/13/2005   REPLACEMENT TOTAL KNEE  01/04/2009   left   RHINOPLASTY  06/13/1984   VAGINAL HYSTERECTOMY  06/13/1978   ovaries left in place   WRIST SURGERY  06/13/1968   cyst removal   Family History  Problem Relation Age of Onset   Pulmonary disease Mother    Heart disease Father        heart attack   Diabetes Father    Multiple myeloma Brother    Breast cancer Maternal Aunt    Breast cancer Maternal Aunt    Prostate cancer Maternal Uncle    Hyperlipidemia Paternal Grandmother    Diabetes Paternal Grandmother    Stroke Paternal Grandmother    Hyperlipidemia Paternal Grandfather     Diabetes Paternal Grandfather    Pancreatic cancer Cousin    Social History   Socioeconomic History   Marital status: Married    Spouse name: Not on file   Number of children: Not on file   Years of education: Not on file   Highest education level: Not on file  Occupational History   Not on file  Tobacco Use   Smoking status: Former    Current packs/day: 0.25    Average packs/day: 0.3 packs/day for 5.0 years (1.3 ttl pk-yrs)    Types: Cigarettes   Smokeless tobacco: Never   Tobacco comments:    Smoked a pack a week in her early 20's. Smoked for 5 years.  Vaping Use   Vaping status: Never Used  Substance and Sexual Activity   Alcohol use: Never    Comment: rare   Drug use: No   Sexual activity: Yes  Other Topics Concern   Not on file  Social History Narrative   married   Social Drivers of Corporate investment banker Strain: Low Risk  (04/24/2023)   Overall Financial Resource Strain (CARDIA)    Difficulty of Paying Living Expenses: Not hard at all  Food Insecurity: No Food Insecurity (04/24/2023)   Hunger Vital Sign    Worried About Running Out of Food in the Last Year: Never  true    Ran Out of Food in the Last Year: Never true  Transportation Needs: No Transportation Needs (04/24/2023)   PRAPARE - Administrator, Civil Service (Medical): No    Lack of Transportation (Non-Medical): No  Physical Activity: Insufficiently Active (04/24/2023)   Exercise Vital Sign    Days of Exercise per Week: 2 days    Minutes of Exercise per Session: 60 min  Stress: No Stress Concern Present (04/24/2023)   [employer] of Occupational Health - Occupational Stress Questionnaire    Feeling of Stress : Only a little  Social Connections: Socially Integrated (04/24/2023)   Social Connection and Isolation Panel [NHANES]    Frequency of Communication with Friends and Family: More than three times a week    Frequency of Social Gatherings with Friends and Family: More than  three times a week    Attends Religious Services: More than 4 times per year    Active Member of [employer] or Organizations: Yes    Attends Engineer, structural: More than 4 times per year    Marital Status: Married     Review of Systems  Constitutional:  Negative for fever.       Some decreased appetite.   HENT:  Positive for congestion. Negative for sinus pressure.   Respiratory:  Positive for cough. Negative for chest tightness and shortness of breath.   Cardiovascular:  Negative for chest pain, palpitations and leg swelling.  Gastrointestinal:  Negative for abdominal pain, nausea and vomiting.       Loose stool as outlined.   Genitourinary:  Negative for difficulty urinating and dysuria.  Musculoskeletal:  Negative for joint swelling and myalgias.  Skin:  Negative for color change and rash.  Neurological:  Negative for dizziness and headaches.  Psychiatric/Behavioral:  Negative for agitation and dysphoric mood.        Objective:     BP 118/70   Pulse 60   Temp 98 F (36.7 C)   Resp 16   Ht 5\' 2"  (1.575 m)   Wt 139 lb (63 kg)   SpO2 98%   BMI 25.42 kg/m  Wt Readings from Last 3 Encounters:  10/25/23 139 lb (63 kg)  10/16/23 145 lb 1.6 oz (65.8 kg)  09/28/23 145 lb (65.8 kg)    Physical Exam Vitals reviewed.  Constitutional:      General: She is not in acute distress.    Appearance: Normal appearance.  HENT:     Head: Normocephalic and atraumatic.     Right Ear: External ear normal.     Left Ear: External ear normal.  Eyes:     General: No scleral icterus.       Right eye: No discharge.        Left eye: No discharge.     Conjunctiva/sclera: Conjunctivae normal.  Neck:     Thyroid : No thyromegaly.  Cardiovascular:     Rate and Rhythm: Normal rate and regular rhythm.  Pulmonary:     Effort: No respiratory distress.     Breath sounds: Normal breath sounds. No wheezing.     Comments: Increased cough with forced expiration.  Abdominal:     General:  Bowel sounds are normal.     Palpations: Abdomen is soft.     Tenderness: There is no abdominal tenderness.  Musculoskeletal:        General: No swelling or tenderness.     Cervical back: Neck supple. No tenderness.  Lymphadenopathy:  Cervical: No cervical adenopathy.  Skin:    Findings: No erythema or rash.  Neurological:     Mental Status: She is alert.  Psychiatric:        Mood and Affect: Mood normal.        Behavior: Behavior normal.         Outpatient Encounter Medications as of 10/25/2023  Medication Sig   [EXPIRED] azithromycin  (ZITHROMAX ) 250 MG tablet Take 2 tablets on day 1, then 1 tablet daily on days 2 through 5   predniSONE  (DELTASONE ) 10 MG tablet Take 4 tablets x 1 day and then decrease by 1/2 tablet per day until down to zero mg.   acetaminophen (TYLENOL) 500 MG tablet Take 500 mg by mouth every 6 (six) hours as needed.   acyclovir  (ZOVIRAX ) 800 MG tablet Take 1 tablet (800 mg total) by mouth 2 (two) times daily as needed.   Albuterol -Budesonide (AIRSUPRA ) 90-80 MCG/ACT AERO Inhale 2 puffs into the lungs every 6 (six) hours as needed (Shortness of breath, wheezing, cough).   Ascorbic Acid (VITAMIN C) 500 MG CAPS Take 500 mg by mouth daily.   B Complex Vitamins (VITAMIN B-COMPLEX) TABS Take 1 tablet by mouth daily.   busPIRone  (BUSPAR ) 5 MG tablet Take 1 tablet (5 mg total) by mouth daily as needed.   Cholecalciferol (VITAMIN D PO) Take 10,000 Units by mouth daily.    Coenzyme Q10 (COQ-10 PO) Take 120 mg by mouth daily.   COLLAGEN PO Take 11 mg by mouth daily.   Hypertonic Nasal Wash (SINUS RINSE NA) Place into the nose as needed.   levothyroxine  (SYNTHROID ) 50 MCG tablet Take 1 tablet (50 mcg total) by mouth every other day.   levothyroxine  (SYNTHROID ) 75 MCG tablet Take 1 tablet (75 mcg total) by mouth every other day.   Omega-3 Fatty Acids (FISH OIL PO) Take 1,200 mg by mouth daily.   omeprazole  (PRILOSEC) 40 MG capsule Take 1 capsule (40 mg total) by mouth  daily.   Probiotic Product (PROBIOTIC DAILY PO) Take 1 capsule by mouth.   rosuvastatin  (CRESTOR ) 10 MG tablet Take 1 tablet (10 mg total) by mouth daily.   sodium chloride  irrigation 0.9 % irrigation sodium chloride  0.9 % irrigation solution   UNABLE TO FIND Take 1 Capful by mouth daily. Mega B-50   vitamin E 400 UNIT capsule Take 400 Units by mouth daily.   [DISCONTINUED] amoxicillin -clavulanate (AUGMENTIN ) 875-125 MG tablet Take 1 tablet by mouth 2 (two) times daily.   [DISCONTINUED] busPIRone  (BUSPAR ) 5 MG tablet Take 1 tablet (5 mg total) by mouth daily as needed.   [DISCONTINUED] levothyroxine  (SYNTHROID ) 50 MCG tablet Take 1 tablet (50 mcg total) by mouth every other day.   No facility-administered encounter medications on file as of 10/25/2023.     Lab Results  Component Value Date   WBC 4.2 04/18/2023   HGB 13.0 04/18/2023   HCT 39.3 04/18/2023   PLT 254.0 04/18/2023   GLUCOSE 74 10/25/2023   CHOL 174 08/22/2023   TRIG 79.0 08/22/2023   HDL 64.00 08/22/2023   LDLDIRECT 123.4 02/27/2013   LDLCALC 94 08/22/2023   ALT 19 08/22/2023   AST 22 08/22/2023   NA 139 10/25/2023   K 3.9 10/25/2023   CL 105 10/25/2023   CREATININE 0.71 10/25/2023   BUN 14 10/25/2023   CO2 27 10/25/2023   TSH 3.32 12/08/2022   HGBA1C 5.5 08/22/2023    CT CHEST HIGH RESOLUTION Result Date: 10/04/2023 CLINICAL DATA:  Nodule, interstitial  lung disease. EXAM: CT CHEST WITHOUT CONTRAST TECHNIQUE: Multidetector CT imaging of the chest was performed following the standard protocol without intravenous contrast. High resolution imaging of the lungs, as well as inspiratory and expiratory imaging, was performed. RADIATION DOSE REDUCTION: This exam was performed according to the departmental dose-optimization program which includes automated exposure control, adjustment of the mA and/or kV according to patient size and/or use of iterative reconstruction technique. COMPARISON:  09/14/2022, 03/09/2022. FINDINGS:  Cardiovascular: Atherosclerotic calcification of the aorta, aortic valve and coronary arteries. Enlarged pulmonic trunk and heart. No pericardial effusion. Mediastinum/Nodes: No pathologically enlarged mediastinal or axillary lymph nodes. Hilar regions are difficult to definitively evaluate without IV contrast. Esophagus is grossly unremarkable. Lungs/Pleura: Cylindrical bronchiectasis. Scattered mucoid impaction. Mild bibasilar subpleural reticular densities and ground-glass possibly with mild traction bronchiolectasis. Findings appear unchanged from 09/14/2022. Chronic subpleural mucoid impaction in the lateral left lower lobe (4/129). No pleural fluid. Airway is unremarkable. No air trapping. Upper Abdomen: Partially imaged low-attenuation lesion in the right kidney. No specific follow-up necessary. Gastric wall thickening, unchanged. Visualized portions of the liver, adrenal glands, kidneys, spleen, pancreas and stomach are otherwise grossly unremarkable. No upper abdominal adenopathy. Musculoskeletal: Degenerative changes in the spine. IMPRESSION: 1. Very mild basilar subpleural reticular densities and ground-glass possibly with mild traction bronchiolectasis, unchanged from 09/14/2022. Findings are categorized as probable UIP per consensus guidelines: Diagnosis of Idiopathic Pulmonary Fibrosis: An Official ATS/ERS/JRS/ALAT Clinical Practice Guideline. Am Annie Barton Crit Care Med Vol 198, Iss 5, [phone], Feb 11 2017. 2. Cylindrical bronchiectasis. 3. Chronic gastric wall thickening. 4. Aortic atherosclerosis (ICD10-I70.0). Coronary artery calcification. 5. Enlarged pulmonic trunk, indicative of pulmonary arterial hypertension. Electronically Signed   By: Shearon Denis M.D.   On: 10/04/2023 13:47       Assessment & Plan:  Persistent cough Assessment & Plan: Persistent increased cough and congestion. Sinus symptoms have improved.  Check cxr. Treat with zpak as directed. Prednisone  taper. Saline nasal  spray, steroid nasal spray and mucinex  as directed. Follow.  Call with update.   Orders: -     DG Chest 2 View; Future  Loose stools Assessment & Plan: Persistent intermittent loose stool as outlined.  Overall improved. Kaopectate prn. Follow. Notify if persistent. Hold on stool studies at this time. Call with update.   Orders: -     Basic metabolic panel with GFR  Coronary artery disease involving native coronary artery of native heart without angina pectoris Assessment & Plan: Being followed by cardiology.  Continue risk factor modification. Continues on crestor .    Gastroesophageal reflux disease with esophagitis without hemorrhage Assessment & Plan: EGD: 12/2021 - stomach biopsy - benign changes c/w fundic gland polyp. No increased acid reflux reported. Continue PPI   Other orders -     busPIRone  HCl; Take 1 tablet (5 mg total) by mouth daily as needed.  Dispense: 30 tablet; Refill: 0 -     Levothyroxine  Sodium; Take 1 tablet (50 mcg total) by mouth every other day.  Dispense: 45 tablet; Refill: 1 -     predniSONE ; Take 4 tablets x 1 day and then decrease by 1/2 tablet per day until down to zero mg.  Dispense: 18 tablet; Refill: 0 -     Azithromycin ; Take 2 tablets on day 1, then 1 tablet daily on days 2 through 5  Dispense: 6 tablet; Refill: 0     Dellar Fenton, MD

## 2023-10-25 NOTE — Patient Instructions (Signed)
 Flonase  or nasacort  nasal spray - 2 sprays each nostril one time per day   Continue saline flushes  Use the airsupra  as we discussed.   Mucinex .

## 2023-10-26 ENCOUNTER — Ambulatory Visit: Payer: HMO | Admitting: Physical Therapy

## 2023-10-27 ENCOUNTER — Encounter: Payer: Self-pay | Admitting: Internal Medicine

## 2023-10-30 ENCOUNTER — Encounter: Payer: Self-pay | Admitting: Internal Medicine

## 2023-10-30 NOTE — Telephone Encounter (Signed)
 Plese let her know that I was not in the office 5/19. Regarding her symptoms, if taking mucinex  is causing diarrhea, have her stop the mucinex . Notify if diarrhea persists. Continue completion of prednisone .  Just completed zpak 5/18. Call with update.

## 2023-10-31 ENCOUNTER — Telehealth: Payer: Self-pay

## 2023-10-31 ENCOUNTER — Encounter: Payer: Self-pay | Admitting: Internal Medicine

## 2023-10-31 ENCOUNTER — Encounter (INDEPENDENT_AMBULATORY_CARE_PROVIDER_SITE_OTHER): Payer: Self-pay

## 2023-10-31 NOTE — Telephone Encounter (Signed)
 Copied from CRM [id number]. Topic: General - Other >> Oct 18, 2023  1:26 PM [name] wrote: Reason for CRM: Patient call back returning call from [place] , relay message per note and informed her if anything was to worsen to call back to be seen >> Oct 31, 2023  4:29 PM [name] wrote: Patient called back stating she finish the zpak and Thursday she will finish the prednisone  but she is still coughing when she lays down at night. Patient stated she still has congestion in there and she states she does not have a fever. The patient stated she has machine where she breathes in and that is helping her, but it makes her cough. Patient states the machine still gets up phlegm that Is yellow. Patient does not know if the doctor need to see her or not   CB [phone]

## 2023-10-31 NOTE — Telephone Encounter (Signed)
 LMTCB

## 2023-10-31 NOTE — Telephone Encounter (Signed)
 Left message to call the office back regarding the message from Dr. Geralyn Knee.  Plese let her know that I was not in the office 5/19. Regarding her symptoms, if taking mucinex  is causing diarrhea, have her stop the mucinex . Notify if diarrhea persists. Continue completion of prednisone .  Just completed zpak 5/18. Call with update.       Okay to give message.

## 2023-10-31 NOTE — Telephone Encounter (Signed)
 Per reviewe, she will finish prednisone  Thursday. Feeling better, but still with cough at night. Need to confirm using steroid nasal spray (nasacort  or flonase ) 2 sprays each nostril q day - would use before bed. Also, delsym cough syrup as directed.  Confirm no increased acid reflux - this can cause cough when lying down. If perssistent symptoms can schedule an appt with me for reevaluation.

## 2023-11-01 NOTE — Telephone Encounter (Signed)
 See other note

## 2023-11-01 NOTE — Telephone Encounter (Signed)
 Patient states she will finish the prednisone  on Thursday. Patient states she still has the cough at night and every once in a while during the day. Patient does take the steroid nasal spray but does it in the mornings but states she is going to try it at night. Patient is also using delsym as directed. Patient denies any increase in acid reflux. Patient wanted to schedule an appointment for next week so I scheduled her for 11/09/23 at 7:30.

## 2023-11-01 NOTE — Telephone Encounter (Signed)
 See telephone encounter.

## 2023-11-05 ENCOUNTER — Encounter: Payer: Self-pay | Admitting: Internal Medicine

## 2023-11-05 NOTE — Assessment & Plan Note (Signed)
 Persistent intermittent loose stool as outlined.  Overall improved. Kaopectate prn. Follow. Notify if persistent. Hold on stool studies at this time. Call with update.

## 2023-11-05 NOTE — Assessment & Plan Note (Signed)
 EGD: 12/2021 - stomach biopsy - benign changes c/w fundic gland polyp. No increased acid reflux reported. Continue PPI

## 2023-11-05 NOTE — Assessment & Plan Note (Signed)
 Being followed by cardiology.  Continue risk factor modification. Continues on crestor .

## 2023-11-05 NOTE — Assessment & Plan Note (Signed)
 Persistent increased cough and congestion. Sinus symptoms have improved.  Check cxr. Treat with zpak as directed. Prednisone  taper. Saline nasal spray, steroid nasal spray and mucinex  as directed. Follow.  Call with update.

## 2023-11-09 ENCOUNTER — Ambulatory Visit (INDEPENDENT_AMBULATORY_CARE_PROVIDER_SITE_OTHER): Admitting: Internal Medicine

## 2023-11-09 VITALS — BP 120/70 | HR 57 | Temp 98.0°F | Resp 16 | Ht 62.0 in | Wt 141.6 lb

## 2023-11-09 DIAGNOSIS — E2839 Other primary ovarian failure: Secondary | ICD-10-CM

## 2023-11-09 DIAGNOSIS — E7800 Pure hypercholesterolemia, unspecified: Secondary | ICD-10-CM

## 2023-11-09 DIAGNOSIS — J4530 Mild persistent asthma, uncomplicated: Secondary | ICD-10-CM | POA: Diagnosis not present

## 2023-11-09 DIAGNOSIS — I700 Atherosclerosis of aorta: Secondary | ICD-10-CM | POA: Diagnosis not present

## 2023-11-09 DIAGNOSIS — R053 Chronic cough: Secondary | ICD-10-CM | POA: Diagnosis not present

## 2023-11-09 DIAGNOSIS — I2510 Atherosclerotic heart disease of native coronary artery without angina pectoris: Secondary | ICD-10-CM | POA: Diagnosis not present

## 2023-11-09 DIAGNOSIS — Z9109 Other allergy status, other than to drugs and biological substances: Secondary | ICD-10-CM

## 2023-11-09 DIAGNOSIS — K2100 Gastro-esophageal reflux disease with esophagitis, without bleeding: Secondary | ICD-10-CM

## 2023-11-09 NOTE — Progress Notes (Signed)
 Subjective:    Patient ID: [name], female    DOB: Jun 05, 1946, 78 y.o.   MRN: [id number]  Patient here for  Chief Complaint  Patient presents with   Medical Management of Chronic Issues    HPI Here for a work in appt. Appt originally scheduled to follow up regarding a persistent cough. Was seen 10/25/23 - persistent chest congestion and cough. See note for details. Treated with zpak and prednisone  taper. Cxr - no pneumonia. She reports she is doing better now. Cough is better. Breathing is better. She is concerned regarding allergies. Request referral to an allergist. No chest pain. No sob reported. No abdominal pain. Has f/u with Dr Lydia Sams (rheumatology) next week. Is scheduled for PFPT. Discussed bone density.    Past Medical History:  Diagnosis Date   Arthritis    Environmental allergies    Esophagitis    Barrets, followed by Dr Rodney Clamp   GERD (gastroesophageal reflux disease)    Hypercholesterolemia    Hypothyroidism    Past Surgical History:  Procedure Laterality Date   ANKLE SURGERY  06/13/1965   cataract surgery Right 02/2023   cataract surgery Left 02/2023   KNEE ARTHROSCOPY  12/03/2007   left   KNEE ARTHROSCOPY  06/25/2008   left   NASAL SINUS SURGERY  05/13/2005   REPLACEMENT TOTAL KNEE  01/04/2009   left   RHINOPLASTY  06/13/1984   VAGINAL HYSTERECTOMY  06/13/1978   ovaries left in place   WRIST SURGERY  06/13/1968   cyst removal   Family History  Problem Relation Age of Onset   Pulmonary disease Mother    Heart disease Father        heart attack   Diabetes Father    Multiple myeloma Brother    Breast cancer Maternal Aunt    Breast cancer Maternal Aunt    Prostate cancer Maternal Uncle    Hyperlipidemia Paternal Grandmother    Diabetes Paternal Grandmother    Stroke Paternal Grandmother    Hyperlipidemia Paternal Grandfather    Diabetes Paternal Grandfather    Pancreatic cancer Cousin    Social History   Socioeconomic History   Marital  status: Married    Spouse name: Not on file   Number of children: Not on file   Years of education: Not on file   Highest education level: Not on file  Occupational History   Not on file  Tobacco Use   Smoking status: Former    Current packs/day: 0.25    Average packs/day: 0.3 packs/day for 5.0 years (1.3 ttl pk-yrs)    Types: Cigarettes   Smokeless tobacco: Never   Tobacco comments:    Smoked a pack a week in her early 20's. Smoked for 5 years.  Vaping Use   Vaping status: Never Used  Substance and Sexual Activity   Alcohol use: Never    Comment: rare   Drug use: No   Sexual activity: Yes  Other Topics Concern   Not on file  Social History Narrative   married   Social Drivers of Corporate investment banker Strain: Low Risk  (04/24/2023)   Overall Financial Resource Strain (CARDIA)    Difficulty of Paying Living Expenses: Not hard at all  Food Insecurity: No Food Insecurity (04/24/2023)   Hunger Vital Sign    Worried About Running Out of Food in the Last Year: Never true    Ran Out of Food in the Last Year: Never true  Transportation Needs:  No Transportation Needs (04/24/2023)   PRAPARE - Administrator, Civil Service (Medical): No    Lack of Transportation (Non-Medical): No  Physical Activity: Insufficiently Active (04/24/2023)   Exercise Vital Sign    Days of Exercise per Week: 2 days    Minutes of Exercise per Session: 60 min  Stress: No Stress Concern Present (04/24/2023)   [employer] of Occupational Health - Occupational Stress Questionnaire    Feeling of Stress : Only a little  Social Connections: Socially Integrated (04/24/2023)   Social Connection and Isolation Panel [NHANES]    Frequency of Communication with Friends and Family: More than three times a week    Frequency of Social Gatherings with Friends and Family: More than three times a week    Attends Religious Services: More than 4 times per year    Active Member of [employer] or  Organizations: Yes    Attends Engineer, structural: More than 4 times per year    Marital Status: Married     Review of Systems  Constitutional:  Negative for appetite change and unexpected weight change.  HENT:  Negative for congestion and sinus pressure.   Respiratory:  Negative for cough, chest tightness and shortness of breath.   Cardiovascular:  Negative for chest pain, palpitations and leg swelling.  Gastrointestinal:  Negative for abdominal pain, diarrhea, nausea and vomiting.  Genitourinary:  Negative for difficulty urinating and dysuria.  Musculoskeletal:  Negative for joint swelling and myalgias.  Skin:  Negative for color change and rash.  Neurological:  Negative for dizziness and headaches.  Psychiatric/Behavioral:  Negative for agitation and dysphoric mood.        Objective:     BP 120/70   Pulse (!) 57   Temp 98 F (36.7 C)   Resp 16   Ht 5\' 2"  (1.575 m)   Wt 141 lb 9.6 oz (64.2 kg)   SpO2 98%   BMI 25.90 kg/m  Wt Readings from Last 3 Encounters:  11/09/23 141 lb 9.6 oz (64.2 kg)  10/25/23 139 lb (63 kg)  10/16/23 145 lb 1.6 oz (65.8 kg)    Physical Exam Vitals reviewed.  Constitutional:      General: She is not in acute distress.    Appearance: Normal appearance.  HENT:     Head: Normocephalic and atraumatic.  Eyes:     General: No scleral icterus.       Right eye: No discharge.        Left eye: No discharge.     Conjunctiva/sclera: Conjunctivae normal.  Neck:     Thyroid : No thyromegaly.  Cardiovascular:     Rate and Rhythm: Normal rate and regular rhythm.  Pulmonary:     Effort: No respiratory distress.     Breath sounds: Normal breath sounds. No wheezing.  Musculoskeletal:        General: No swelling or tenderness.  Skin:    Findings: No erythema or rash.  Neurological:     Mental Status: She is alert.  Psychiatric:        Mood and Affect: Mood normal.        Behavior: Behavior normal.         Outpatient Encounter  Medications as of 11/09/2023  Medication Sig   acetaminophen (TYLENOL) 500 MG tablet Take 500 mg by mouth every 6 (six) hours as needed.   Albuterol -Budesonide (AIRSUPRA ) 90-80 MCG/ACT AERO Inhale 2 puffs into the lungs every 6 (six) hours as needed (Shortness of  breath, wheezing, cough).   Ascorbic Acid (VITAMIN C) 500 MG CAPS Take 500 mg by mouth daily.   B Complex Vitamins (VITAMIN B-COMPLEX) TABS Take 1 tablet by mouth daily.   busPIRone  (BUSPAR ) 5 MG tablet Take 1 tablet (5 mg total) by mouth daily as needed.   Cholecalciferol (VITAMIN D PO) Take 10,000 Units by mouth daily.    Coenzyme Q10 (COQ-10 PO) Take 120 mg by mouth daily.   COLLAGEN PO Take 11 mg by mouth daily.   Hypertonic Nasal Wash (SINUS RINSE NA) Place into the nose as needed.   levothyroxine  (SYNTHROID ) 50 MCG tablet Take 1 tablet (50 mcg total) by mouth every other day.   levothyroxine  (SYNTHROID ) 75 MCG tablet Take 1 tablet (75 mcg total) by mouth every other day.   Omega-3 Fatty Acids (FISH OIL PO) Take 1,200 mg by mouth daily.   omeprazole  (PRILOSEC) 40 MG capsule Take 1 capsule (40 mg total) by mouth daily.   Probiotic Product (PROBIOTIC DAILY PO) Take 1 capsule by mouth.   rosuvastatin  (CRESTOR ) 10 MG tablet Take 1 tablet (10 mg total) by mouth daily.   sodium chloride  irrigation 0.9 % irrigation sodium chloride  0.9 % irrigation solution   UNABLE TO FIND Take 1 Capful by mouth daily. Mega B-50   vitamin E 400 UNIT capsule Take 400 Units by mouth daily.   [DISCONTINUED] acyclovir  (ZOVIRAX ) 800 MG tablet Take 1 tablet (800 mg total) by mouth 2 (two) times daily as needed.   [DISCONTINUED] predniSONE  (DELTASONE ) 10 MG tablet Take 4 tablets x 1 day and then decrease by 1/2 tablet per day until down to zero mg.   No facility-administered encounter medications on file as of 11/09/2023.     Lab Results  Component Value Date   WBC 4.2 04/18/2023   HGB 13.0 04/18/2023   HCT 39.3 04/18/2023   PLT 254.0 04/18/2023    GLUCOSE 74 10/25/2023   CHOL 174 08/22/2023   TRIG 79.0 08/22/2023   HDL 64.00 08/22/2023   LDLDIRECT 123.4 02/27/2013   LDLCALC 94 08/22/2023   ALT 19 08/22/2023   AST 22 08/22/2023   NA 139 10/25/2023   K 3.9 10/25/2023   CL 105 10/25/2023   CREATININE 0.71 10/25/2023   BUN 14 10/25/2023   CO2 27 10/25/2023   TSH 3.32 12/08/2022   HGBA1C 5.5 08/22/2023    CT CHEST HIGH RESOLUTION Result Date: 10/04/2023 CLINICAL DATA:  Nodule, interstitial lung disease. EXAM: CT CHEST WITHOUT CONTRAST TECHNIQUE: Multidetector CT imaging of the chest was performed following the standard protocol without intravenous contrast. High resolution imaging of the lungs, as well as inspiratory and expiratory imaging, was performed. RADIATION DOSE REDUCTION: This exam was performed according to the departmental dose-optimization program which includes automated exposure control, adjustment of the mA and/or kV according to patient size and/or use of iterative reconstruction technique. COMPARISON:  09/14/2022, 03/09/2022. FINDINGS: Cardiovascular: Atherosclerotic calcification of the aorta, aortic valve and coronary arteries. Enlarged pulmonic trunk and heart. No pericardial effusion. Mediastinum/Nodes: No pathologically enlarged mediastinal or axillary lymph nodes. Hilar regions are difficult to definitively evaluate without IV contrast. Esophagus is grossly unremarkable. Lungs/Pleura: Cylindrical bronchiectasis. Scattered mucoid impaction. Mild bibasilar subpleural reticular densities and ground-glass possibly with mild traction bronchiolectasis. Findings appear unchanged from 09/14/2022. Chronic subpleural mucoid impaction in the lateral left lower lobe (4/129). No pleural fluid. Airway is unremarkable. No air trapping. Upper Abdomen: Partially imaged low-attenuation lesion in the right kidney. No specific follow-up necessary. Gastric wall thickening, unchanged. Visualized  portions of the liver, adrenal glands, kidneys,  spleen, pancreas and stomach are otherwise grossly unremarkable. No upper abdominal adenopathy. Musculoskeletal: Degenerative changes in the spine. IMPRESSION: 1. Very mild basilar subpleural reticular densities and ground-glass possibly with mild traction bronchiolectasis, unchanged from 09/14/2022. Findings are categorized as probable UIP per consensus guidelines: Diagnosis of Idiopathic Pulmonary Fibrosis: An Official ATS/ERS/JRS/ALAT Clinical Practice Guideline. Am Annie Barton Crit Care Med Vol 198, Iss 5, [phone], Feb 11 2017. 2. Cylindrical bronchiectasis. 3. Chronic gastric wall thickening. 4. Aortic atherosclerosis (ICD10-I70.0). Coronary artery calcification. 5. Enlarged pulmonic trunk, indicative of pulmonary arterial hypertension. Electronically Signed   By: Shearon Denis M.D.   On: 10/04/2023 13:47       Assessment & Plan:  Hypercholesteremia Assessment & Plan: Continues crestor . Low cholesterol diet and exercise. Follow lipid panel.   Lab Results  Component Value Date   CHOL 174 08/22/2023   HDL 64.00 08/22/2023   LDLCALC 94 08/22/2023   LDLDIRECT 123.4 02/27/2013   TRIG 79.0 08/22/2023   CHOLHDL 3 08/22/2023      AB (asthmatic bronchitis), mild persistent, uncomplicated Assessment & Plan: Breathing stable. Off trelegy.   Aortic atherosclerosis (HCC) Assessment & Plan: Continue crestor .    Coronary artery disease involving native coronary artery of native heart without angina pectoris Assessment & Plan: Being followed by cardiology.  Continue risk factor modification. Continue crestor .    Gastroesophageal reflux disease with esophagitis without hemorrhage Assessment & Plan: Continue PPI. No upper symptoms reported.    Persistent cough Assessment & Plan: Resolved.    Environmental allergies Assessment & Plan: Had recent persistent cough. Better now. She is concerned regarding allergy triggers. Request referral to allergist.   Orders: -     Ambulatory  referral to Allergy  Estrogen deficiency -     DG Bone Density; Future     Dellar Fenton, MD

## 2023-11-12 ENCOUNTER — Encounter: Payer: Self-pay | Admitting: Internal Medicine

## 2023-11-12 DIAGNOSIS — Z9109 Other allergy status, other than to drugs and biological substances: Secondary | ICD-10-CM | POA: Insufficient documentation

## 2023-11-12 NOTE — Assessment & Plan Note (Signed)
 Had recent persistent cough. Better now. She is concerned regarding allergy triggers. Request referral to allergist.

## 2023-11-12 NOTE — Assessment & Plan Note (Signed)
 Continues crestor . Low cholesterol diet and exercise. Follow lipid panel.   Lab Results  Component Value Date   CHOL 174 08/22/2023   HDL 64.00 08/22/2023   LDLCALC 94 08/22/2023   LDLDIRECT 123.4 02/27/2013   TRIG 79.0 08/22/2023   CHOLHDL 3 08/22/2023

## 2023-11-12 NOTE — Assessment & Plan Note (Signed)
 Continue crestor

## 2023-11-12 NOTE — Assessment & Plan Note (Signed)
Being followed by cardiology.  Continue risk factor modification.  Continue crestor.  

## 2023-11-12 NOTE — Assessment & Plan Note (Signed)
 Resolved

## 2023-11-12 NOTE — Assessment & Plan Note (Signed)
 Continue PPI. No upper symptoms reported.

## 2023-11-12 NOTE — Assessment & Plan Note (Signed)
 Breathing stable. Off trelegy.

## 2023-11-14 DIAGNOSIS — M150 Primary generalized (osteo)arthritis: Secondary | ICD-10-CM | POA: Diagnosis not present

## 2023-11-14 DIAGNOSIS — R768 Other specified abnormal immunological findings in serum: Secondary | ICD-10-CM | POA: Diagnosis not present

## 2023-11-14 DIAGNOSIS — J849 Interstitial pulmonary disease, unspecified: Secondary | ICD-10-CM | POA: Diagnosis not present

## 2023-11-16 ENCOUNTER — Ambulatory Visit: Payer: HMO | Attending: Internal Medicine | Admitting: Physical Therapy

## 2023-11-16 DIAGNOSIS — R2689 Other abnormalities of gait and mobility: Secondary | ICD-10-CM | POA: Diagnosis present

## 2023-11-16 DIAGNOSIS — M533 Sacrococcygeal disorders, not elsewhere classified: Secondary | ICD-10-CM | POA: Diagnosis present

## 2023-11-16 DIAGNOSIS — M79671 Pain in right foot: Secondary | ICD-10-CM | POA: Diagnosis present

## 2023-11-16 DIAGNOSIS — M5459 Other low back pain: Secondary | ICD-10-CM | POA: Diagnosis present

## 2023-11-16 DIAGNOSIS — M79672 Pain in left foot: Secondary | ICD-10-CM | POA: Diagnosis not present

## 2023-11-16 DIAGNOSIS — M542 Cervicalgia: Secondary | ICD-10-CM | POA: Diagnosis not present
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
## 2023-11-16 NOTE — Patient Instructions (Addendum)
 Stretches :   Neck / shoulder stretches:    Lying on back - small sushi roll towel under neck  _ 6 directions   Chin up, down Rotation like changing lanes when driving Ear to shoulder like puppy dog   10 reps    _angel wings, lower elbows down , keep arms touching bed  10 reps    ____________  ZigZag stretch  Reclined twist for hips and side of the hips/ legs  Lay on your back, knees bend Scoot hips to the R , leave shoulders in place Wobble knees to the L side 45 deg and to midline  10 reps     ____   Occupational hygienist at [email or web address]     Two pieces for the  Right shoe    One Large, for toe box  One medium for the heel     ** It comes with thick piece and   be sure to peel top piece off and use that piece in the shoe  ** REPLACE every 6 months as they get worn down

## 2023-11-23 ENCOUNTER — Ambulatory Visit: Payer: HMO | Admitting: Physical Therapy

## 2023-11-23 DIAGNOSIS — M533 Sacrococcygeal disorders, not elsewhere classified: Secondary | ICD-10-CM | POA: Diagnosis not present

## 2023-11-23 DIAGNOSIS — M5459 Other low back pain: Secondary | ICD-10-CM

## 2023-11-23 DIAGNOSIS — M79672 Pain in left foot: Secondary | ICD-10-CM

## 2023-11-23 DIAGNOSIS — M79671 Pain in right foot: Secondary | ICD-10-CM

## 2023-11-23 DIAGNOSIS — R2689 Other abnormalities of gait and mobility: Secondary | ICD-10-CM

## 2023-11-23 DIAGNOSIS — M542 Cervicalgia: Secondary | ICD-10-CM
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
## 2023-11-23 NOTE — Patient Instructions (Signed)
 Stretches :   Neck / shoulder stretches:   kitchen counter stretches  Hands on kitchen counter,   Palms shoulder width apart  Minisquat postion Trunk is parallel to floor  A) Pull buttocks back to lengthen spine, knees bent  3 breaths   B) Bring R hand to the L, and stretch the R side trunk  3 breaths   Brings hands to center again Do the same to the L side stretch by placing L hand on top of R   D) Modified thread the needle R hand on L thigh, L  thigh pushing out slightly as the R hands pull in,  elbow bent and pulls to theR,  Look under L armpit   Do the same to other side  ____

## 2023-11-28 DIAGNOSIS — M19071 Primary osteoarthritis, right ankle and foot: Secondary | ICD-10-CM | POA: Diagnosis not present

## 2023-11-28 DIAGNOSIS — M19072 Primary osteoarthritis, left ankle and foot: Secondary | ICD-10-CM | POA: Diagnosis not present

## 2023-11-29 ENCOUNTER — Ambulatory Visit: Payer: HMO | Admitting: Physical Therapy

## 2023-11-29 DIAGNOSIS — M533 Sacrococcygeal disorders, not elsewhere classified: Secondary | ICD-10-CM

## 2023-11-29 DIAGNOSIS — M5459 Other low back pain: Secondary | ICD-10-CM

## 2023-11-29 DIAGNOSIS — R2689 Other abnormalities of gait and mobility: Secondary | ICD-10-CM

## 2023-11-29 DIAGNOSIS — M79671 Pain in right foot: Secondary | ICD-10-CM

## 2023-11-29 DIAGNOSIS — M542 Cervicalgia: Secondary | ICD-10-CM

## 2023-11-29 NOTE — Patient Instructions (Addendum)
   Feet slides :   Points of contact at sitting bones  Four points of contact of foot,  Side knee back while keeping knee out along 2-3rd toe line   Heel up, ankle not twist out Lower heel while keeping knee out along 2-3rd toe line Four points of contact of foot, Slide foot back while keeping knee out along 2-3rd toe line   Repeated with other foot    3 min  __   In your morning yoga practice:   Ballmounds less heels, unlock knees
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
## 2023-12-07 ENCOUNTER — Ambulatory Visit: Payer: HMO | Admitting: Physical Therapy

## 2023-12-07 DIAGNOSIS — M533 Sacrococcygeal disorders, not elsewhere classified: Secondary | ICD-10-CM

## 2023-12-07 DIAGNOSIS — R2689 Other abnormalities of gait and mobility: Secondary | ICD-10-CM

## 2023-12-07 DIAGNOSIS — M542 Cervicalgia: Secondary | ICD-10-CM

## 2023-12-07 DIAGNOSIS — M5459 Other low back pain: Secondary | ICD-10-CM

## 2023-12-07 DIAGNOSIS — M79671 Pain in right foot: Secondary | ICD-10-CM
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
## 2023-12-11 DIAGNOSIS — R200 Anesthesia of skin: Secondary | ICD-10-CM | POA: Diagnosis not present

## 2023-12-14 ENCOUNTER — Ambulatory Visit: Payer: HMO | Attending: Internal Medicine | Admitting: Physical Therapy

## 2023-12-14 DIAGNOSIS — M79671 Pain in right foot: Secondary | ICD-10-CM | POA: Diagnosis not present

## 2023-12-14 DIAGNOSIS — M533 Sacrococcygeal disorders, not elsewhere classified: Secondary | ICD-10-CM | POA: Diagnosis present

## 2023-12-14 DIAGNOSIS — M542 Cervicalgia: Secondary | ICD-10-CM | POA: Diagnosis present

## 2023-12-14 DIAGNOSIS — R2689 Other abnormalities of gait and mobility: Secondary | ICD-10-CM | POA: Insufficient documentation

## 2023-12-14 DIAGNOSIS — M79672 Pain in left foot: Secondary | ICD-10-CM | POA: Diagnosis not present

## 2023-12-14 DIAGNOSIS — M5459 Other low back pain: Secondary | ICD-10-CM | POA: Insufficient documentation
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
## 2023-12-20 ENCOUNTER — Ambulatory Visit: Payer: HMO | Admitting: Physical Therapy

## 2023-12-20 DIAGNOSIS — M5459 Other low back pain: Secondary | ICD-10-CM

## 2023-12-20 DIAGNOSIS — M533 Sacrococcygeal disorders, not elsewhere classified: Secondary | ICD-10-CM | POA: Diagnosis not present

## 2023-12-20 DIAGNOSIS — M542 Cervicalgia: Secondary | ICD-10-CM

## 2023-12-20 DIAGNOSIS — M79671 Pain in right foot: Secondary | ICD-10-CM

## 2023-12-20 DIAGNOSIS — M79672 Pain in left foot: Secondary | ICD-10-CM

## 2023-12-20 DIAGNOSIS — R2689 Other abnormalities of gait and mobility: Secondary | ICD-10-CM

## 2023-12-20 NOTE — Patient Instructions (Addendum)
  Stretch for pelvic floor     On belly: Riding horse edge of mattress  knee bent like riding a horse, move knee towards armpit and out  10 reps     Childs pose rocking    Toes tucked, shoulders down and back, on forearms , hands shoulder width apart, fingers straight, elbow back , squeeze imaginary pencils in armpit, shoulder down and away from ears   10 reps    Hip abductor and hip flexors stretch at the same time   ** do not lean as far to prevent knee pain   Alternative if hip is tight,   Sit at 45 deg turn with R leg and knee on edge of chair/ bench, L buttock hanging off the edge to bring the L foot back like a lunge, toes bent, lower heel to feel quad stretch,  pay attention to keeping pinky and first toe ballmound planted to align toes forward so ankles are not twerked   Repeat with other side   ___    When doing dep core,  Notices points of contact  ->  find slight anterior tilt of pelvis , sink into feet, -> inhale wide at ribs, exhale pelvic floor rises, low  belly sinks, upper belly sinks
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
## 2023-12-26 ENCOUNTER — Other Ambulatory Visit (INDEPENDENT_AMBULATORY_CARE_PROVIDER_SITE_OTHER)

## 2023-12-26 DIAGNOSIS — E039 Hypothyroidism, unspecified: Secondary | ICD-10-CM

## 2023-12-26 DIAGNOSIS — E7800 Pure hypercholesterolemia, unspecified: Secondary | ICD-10-CM

## 2023-12-26 DIAGNOSIS — R739 Hyperglycemia, unspecified: Secondary | ICD-10-CM | POA: Diagnosis not present

## 2023-12-26 LAB — LIPID PANEL
Cholesterol: 190 mg/dL (ref 0–200)
HDL: 63.3 mg/dL
LDL Cholesterol: 108 mg/dL — ABNORMAL HIGH (ref 0–99)
NonHDL: 126.94
Total CHOL/HDL Ratio: 3
Triglycerides: 94 mg/dL (ref 0.0–149.0)
VLDL: 18.8 mg/dL (ref 0.0–40.0)

## 2023-12-26 LAB — HEPATIC FUNCTION PANEL
ALT: 16 U/L (ref 0–35)
AST: 21 U/L (ref 0–37)
Albumin: 4.6 g/dL (ref 3.5–5.2)
Alkaline Phosphatase: 54 U/L (ref 39–117)
Bilirubin, Direct: 0.1 mg/dL (ref 0.0–0.3)
Total Bilirubin: 0.8 mg/dL (ref 0.2–1.2)
Total Protein: 6.9 g/dL (ref 6.0–8.3)

## 2023-12-26 LAB — HEMOGLOBIN A1C: Hgb A1c MFr Bld: 5.5 % (ref 4.6–6.5)

## 2023-12-26 LAB — TSH: TSH: 0.63 u[IU]/mL (ref 0.35–5.50)

## 2023-12-27 ENCOUNTER — Ambulatory Visit: Payer: Self-pay | Admitting: Internal Medicine

## 2023-12-27 ENCOUNTER — Ambulatory Visit (INDEPENDENT_AMBULATORY_CARE_PROVIDER_SITE_OTHER)

## 2023-12-27 ENCOUNTER — Other Ambulatory Visit: Payer: Self-pay | Admitting: Internal Medicine

## 2023-12-27 DIAGNOSIS — R739 Hyperglycemia, unspecified: Secondary | ICD-10-CM

## 2023-12-27 DIAGNOSIS — E7800 Pure hypercholesterolemia, unspecified: Secondary | ICD-10-CM

## 2023-12-27 LAB — BASIC METABOLIC PANEL WITH GFR
BUN: 18 mg/dL (ref 6–23)
CO2: 29 meq/L (ref 19–32)
Calcium: 9.9 mg/dL (ref 8.4–10.5)
Chloride: 103 meq/L (ref 96–112)
Creatinine, Ser: 0.78 mg/dL (ref 0.40–1.20)
GFR: 72.56 mL/min
Glucose, Bld: 97 mg/dL (ref 70–99)
Potassium: 4.2 meq/L (ref 3.5–5.1)
Sodium: 142 meq/L (ref 135–145)

## 2023-12-27 NOTE — Progress Notes (Signed)
Order placed for add on lab - met b.

## 2023-12-28 ENCOUNTER — Ambulatory Visit: Payer: HMO | Admitting: Physical Therapy

## 2023-12-28 ENCOUNTER — Ambulatory Visit: Payer: Self-pay | Admitting: Internal Medicine

## 2023-12-28 ENCOUNTER — Encounter: Admitting: Internal Medicine

## 2023-12-28 DIAGNOSIS — R2689 Other abnormalities of gait and mobility: Secondary | ICD-10-CM

## 2023-12-28 DIAGNOSIS — M533 Sacrococcygeal disorders, not elsewhere classified: Secondary | ICD-10-CM | POA: Diagnosis not present

## 2023-12-28 DIAGNOSIS — M542 Cervicalgia: Secondary | ICD-10-CM

## 2023-12-28 DIAGNOSIS — M79671 Pain in right foot: Secondary | ICD-10-CM

## 2023-12-28 DIAGNOSIS — M5459 Other low back pain: Secondary | ICD-10-CM
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
## 2023-12-28 NOTE — Patient Instructions (Signed)
 Feet care :  Self -feet massage  ? ?Handshake : fingers between toes, moving ballmounds/toes back and forth several times while other hand anchors at arch. Do the same at the hind/mid foot.  ?Heel to toes upward to a letter Big Letter T strokes to spread ballmounds and toes, several times, pinch between webs of toes  ?Run finger tips along top of foot between long bones "comb between the bones"  ? ? ?Wiggle toes and spread them out when relaxing  ? ?

## 2024-01-01 ENCOUNTER — Ambulatory Visit (INDEPENDENT_AMBULATORY_CARE_PROVIDER_SITE_OTHER): Admitting: Internal Medicine

## 2024-01-01 VITALS — BP 120/70 | HR 66 | Resp 16 | Ht 63.0 in | Wt 141.6 lb

## 2024-01-01 DIAGNOSIS — K2100 Gastro-esophageal reflux disease with esophagitis, without bleeding: Secondary | ICD-10-CM | POA: Diagnosis not present

## 2024-01-01 DIAGNOSIS — Z9109 Other allergy status, other than to drugs and biological substances: Secondary | ICD-10-CM

## 2024-01-01 DIAGNOSIS — J849 Interstitial pulmonary disease, unspecified: Secondary | ICD-10-CM | POA: Diagnosis not present

## 2024-01-01 DIAGNOSIS — I2510 Atherosclerotic heart disease of native coronary artery without angina pectoris: Secondary | ICD-10-CM

## 2024-01-01 DIAGNOSIS — F439 Reaction to severe stress, unspecified: Secondary | ICD-10-CM

## 2024-01-01 DIAGNOSIS — I700 Atherosclerosis of aorta: Secondary | ICD-10-CM | POA: Diagnosis not present

## 2024-01-01 DIAGNOSIS — R739 Hyperglycemia, unspecified: Secondary | ICD-10-CM | POA: Diagnosis not present

## 2024-01-01 DIAGNOSIS — Z0000 Encounter for general adult medical examination without abnormal findings: Secondary | ICD-10-CM | POA: Diagnosis not present

## 2024-01-01 DIAGNOSIS — E7800 Pure hypercholesterolemia, unspecified: Secondary | ICD-10-CM

## 2024-01-01 DIAGNOSIS — I779 Disorder of arteries and arterioles, unspecified: Secondary | ICD-10-CM

## 2024-01-01 DIAGNOSIS — E039 Hypothyroidism, unspecified: Secondary | ICD-10-CM | POA: Diagnosis not present

## 2024-01-01 DIAGNOSIS — J4530 Mild persistent asthma, uncomplicated: Secondary | ICD-10-CM

## 2024-01-01 MED ORDER — LEVOTHYROXINE SODIUM 50 MCG PO TABS: 50.0000 ug | ORAL_TABLET | ORAL | 1 refills | Status: DC

## 2024-01-01 NOTE — Progress Notes (Signed)
 Subjective:    Patient ID: [name], female    DOB: [birth year], 78 y.o.   MRN: [id number]  Patient here for  Chief Complaint  Patient presents with   Annual Exam    HPI Here for a physical exam. Just had physical 08/24/23. Appt changed to f/u appt. Going to PPT - rectocele, pelvic pain and low back pain. Had f/u with rheumatology 11/14/23 - f/u positive ANA centromere pattern with elevated SSA antibody. Dry eyes. Felt exam most c/w OA. Recommended to continue to monitor. No other treatment warranted. Last visit, requested referral to allergist. Had f/u with Dr Tamea - 09/28/23 - ari supra prn. No f/u CT recommended. Breathing stable. Allergy testing - Thursday. No chest pain reported. No abdominal pain or bowel change reported. Has f/u with PFPT this month and f/u with ENT next month.    Past Medical History:  Diagnosis Date   Arthritis    Environmental allergies    Esophagitis    Barrets, followed by Dr Ulysees Sous   GERD (gastroesophageal reflux disease)    Hypercholesterolemia    Hypothyroidism    Past Surgical History:  Procedure Laterality Date   ANKLE SURGERY  06/13/1965   cataract surgery Right 02/2023   cataract surgery Left 02/2023   KNEE ARTHROSCOPY  12/03/2007   left   KNEE ARTHROSCOPY  06/25/2008   left   NASAL SINUS SURGERY  05/13/2005   REPLACEMENT TOTAL KNEE  01/04/2009   left   RHINOPLASTY  06/13/1984   VAGINAL HYSTERECTOMY  06/13/1978   ovaries left in place   WRIST SURGERY  06/13/1968   cyst removal   Family History  Problem Relation Age of Onset   Pulmonary disease Mother    Heart disease Father        heart attack   Diabetes Father    Multiple myeloma Brother    Breast cancer Maternal Aunt    Breast cancer Maternal Aunt    Prostate cancer Maternal Uncle    Hyperlipidemia Paternal Grandmother    Diabetes Paternal Grandmother    Stroke Paternal Grandmother    Hyperlipidemia Paternal Grandfather    Diabetes Paternal Grandfather     Pancreatic cancer Cousin    Social History   Socioeconomic History   Marital status: Married    Spouse name: Not on file   Number of children: Not on file   Years of education: Not on file   Highest education level: Not on file  Occupational History   Not on file  Tobacco Use   Smoking status: Former    Current packs/day: 0.25    Average packs/day: 0.3 packs/day for 5.0 years (1.3 ttl pk-yrs)    Types: Cigarettes   Smokeless tobacco: Never   Tobacco comments:    Smoked a pack a week in her early 20's. Smoked for 5 years.  Vaping Use   Vaping status: Never Used  Substance and Sexual Activity   Alcohol use: Never    Comment: rare   Drug use: No   Sexual activity: Yes  Other Topics Concern   Not on file  Social History Narrative   married   Social Drivers of Corporate investment banker Strain: Low Risk  (04/24/2023)   Overall Financial Resource Strain (CARDIA)    Difficulty of Paying Living Expenses: Not hard at all  Food Insecurity: No Food Insecurity (04/24/2023)   Hunger Vital Sign    Worried About Running Out of Food in the Last Year: Never  true    Ran Out of Food in the Last Year: Never true  Transportation Needs: No Transportation Needs (04/24/2023)   PRAPARE - Administrator, Civil Service (Medical): No    Lack of Transportation (Non-Medical): No  Physical Activity: Insufficiently Active (04/24/2023)   Exercise Vital Sign    Days of Exercise per Week: 2 days    Minutes of Exercise per Session: 60 min  Stress: No Stress Concern Present (04/24/2023)   [employer] of Occupational Health - Occupational Stress Questionnaire    Feeling of Stress : Only a little  Social Connections: Socially Integrated (04/24/2023)   Social Connection and Isolation Panel    Frequency of Communication with Friends and Family: More than three times a week    Frequency of Social Gatherings with Friends and Family: More than three times a week    Attends Religious  Services: More than 4 times per year    Active Member of [employer] or Organizations: Yes    Attends Engineer, structural: More than 4 times per year    Marital Status: Married     Review of Systems  Constitutional:  Negative for appetite change and unexpected weight change.  HENT:  Negative for congestion and sinus pressure.   Respiratory:  Negative for chest tightness and shortness of breath.        Cough is better.   Cardiovascular:  Negative for chest pain, palpitations and leg swelling.  Gastrointestinal:  Negative for abdominal pain, diarrhea, nausea and vomiting.  Genitourinary:  Negative for difficulty urinating and dysuria.  Musculoskeletal:  Negative for joint swelling and myalgias.  Skin:  Negative for color change and rash.  Neurological:  Negative for dizziness and headaches.  Psychiatric/Behavioral:  Negative for agitation and dysphoric mood.        Increased stress. Discussed. Has buspar .        Objective:     BP 120/70   Pulse 66   Resp 16   Ht 5' 3 (1.6 m)   Wt 141 lb 9.6 oz (64.2 kg)   SpO2 98%   BMI 25.08 kg/m  Wt Readings from Last 3 Encounters:  01/01/24 141 lb 9.6 oz (64.2 kg)  11/09/23 141 lb 9.6 oz (64.2 kg)  10/25/23 139 lb (63 kg)    Physical Exam Vitals reviewed.  Constitutional:      General: She is not in acute distress.    Appearance: Normal appearance.  HENT:     Head: Normocephalic and atraumatic.     Right Ear: External ear normal.     Left Ear: External ear normal.     Mouth/Throat:     Pharynx: No oropharyngeal exudate or posterior oropharyngeal erythema.  Eyes:     General: No scleral icterus.       Right eye: No discharge.        Left eye: No discharge.     Conjunctiva/sclera: Conjunctivae normal.  Neck:     Thyroid : No thyromegaly.  Cardiovascular:     Rate and Rhythm: Normal rate and regular rhythm.  Pulmonary:     Effort: No respiratory distress.     Breath sounds: Normal breath sounds. No wheezing.   Abdominal:     General: Bowel sounds are normal.     Palpations: Abdomen is soft.     Tenderness: There is no abdominal tenderness.  Musculoskeletal:        General: No swelling or tenderness.     Cervical back: Neck supple.  No tenderness.  Lymphadenopathy:     Cervical: No cervical adenopathy.  Skin:    Findings: No erythema or rash.  Neurological:     Mental Status: She is alert.  Psychiatric:        Mood and Affect: Mood normal.        Behavior: Behavior normal.         Outpatient Encounter Medications as of 01/01/2024  Medication Sig   acetaminophen (TYLENOL) 500 MG tablet Take 500 mg by mouth every 6 (six) hours as needed.   Albuterol -Budesonide (AIRSUPRA ) 90-80 MCG/ACT AERO Inhale 2 puffs into the lungs every 6 (six) hours as needed (Shortness of breath, wheezing, cough).   Ascorbic Acid (VITAMIN C) 500 MG CAPS Take 500 mg by mouth daily.   B Complex Vitamins (VITAMIN B-COMPLEX) TABS Take 1 tablet by mouth daily.   busPIRone  (BUSPAR ) 5 MG tablet Take 1 tablet (5 mg total) by mouth daily as needed.   Cholecalciferol (VITAMIN D PO) Take 10,000 Units by mouth daily.    Coenzyme Q10 (COQ-10 PO) Take 120 mg by mouth daily.   COLLAGEN PO Take 11 mg by mouth daily.   Hypertonic Nasal Wash (SINUS RINSE NA) Place into the nose as needed.   levothyroxine  (SYNTHROID ) 50 MCG tablet Take 1 tablet (50 mcg total) by mouth every other day.   levothyroxine  (SYNTHROID ) 75 MCG tablet Take 1 tablet (75 mcg total) by mouth every other day.   Omega-3 Fatty Acids (FISH OIL PO) Take 1,200 mg by mouth daily.   omeprazole  (PRILOSEC) 40 MG capsule Take 1 capsule (40 mg total) by mouth daily.   Probiotic Product (PROBIOTIC DAILY PO) Take 1 capsule by mouth.   rosuvastatin  (CRESTOR ) 10 MG tablet Take 1 tablet (10 mg total) by mouth daily.   sodium chloride  irrigation 0.9 % irrigation sodium chloride  0.9 % irrigation solution   vitamin E 400 UNIT capsule Take 400 Units by mouth daily.    [DISCONTINUED] levothyroxine  (SYNTHROID ) 50 MCG tablet Take 1 tablet (50 mcg total) by mouth every other day.   [DISCONTINUED] UNABLE TO FIND Take 1 Capful by mouth daily. Mega B-50   No facility-administered encounter medications on file as of 01/01/2024.     Lab Results  Component Value Date   WBC 4.2 04/18/2023   HGB 13.0 04/18/2023   HCT 39.3 04/18/2023   PLT 254.0 04/18/2023   GLUCOSE 97 12/27/2023   CHOL 190 12/26/2023   TRIG 94.0 12/26/2023   HDL 63.30 12/26/2023   LDLDIRECT 123.4 02/27/2013   LDLCALC 108 (H) 12/26/2023   ALT 16 12/26/2023   AST 21 12/26/2023   NA 142 12/27/2023   K 4.2 12/27/2023   CL 103 12/27/2023   CREATININE 0.78 12/27/2023   BUN 18 12/27/2023   CO2 29 12/27/2023   TSH 0.63 12/26/2023   HGBA1C 5.5 12/26/2023    CT CHEST HIGH RESOLUTION Result Date: 10/04/2023 CLINICAL DATA:  Nodule, interstitial lung disease. EXAM: CT CHEST WITHOUT CONTRAST TECHNIQUE: Multidetector CT imaging of the chest was performed following the standard protocol without intravenous contrast. High resolution imaging of the lungs, as well as inspiratory and expiratory imaging, was performed. RADIATION DOSE REDUCTION: This exam was performed according to the departmental dose-optimization program which includes automated exposure control, adjustment of the mA and/or kV according to patient size and/or use of iterative reconstruction technique. COMPARISON:  09/14/2022, 03/09/2022. FINDINGS: Cardiovascular: Atherosclerotic calcification of the aorta, aortic valve and coronary arteries. Enlarged pulmonic trunk and heart. No pericardial effusion.  Mediastinum/Nodes: No pathologically enlarged mediastinal or axillary lymph nodes. Hilar regions are difficult to definitively evaluate without IV contrast. Esophagus is grossly unremarkable. Lungs/Pleura: Cylindrical bronchiectasis. Scattered mucoid impaction. Mild bibasilar subpleural reticular densities and ground-glass possibly with mild traction  bronchiolectasis. Findings appear unchanged from 09/14/2022. Chronic subpleural mucoid impaction in the lateral left lower lobe (4/129). No pleural fluid. Airway is unremarkable. No air trapping. Upper Abdomen: Partially imaged low-attenuation lesion in the right kidney. No specific follow-up necessary. Gastric wall thickening, unchanged. Visualized portions of the liver, adrenal glands, kidneys, spleen, pancreas and stomach are otherwise grossly unremarkable. No upper abdominal adenopathy. Musculoskeletal: Degenerative changes in the spine. IMPRESSION: 1. Very mild basilar subpleural reticular densities and ground-glass possibly with mild traction bronchiolectasis, unchanged from 09/14/2022. Findings are categorized as probable UIP per consensus guidelines: Diagnosis of Idiopathic Pulmonary Fibrosis: An Official ATS/ERS/JRS/ALAT Clinical Practice Guideline. Am JINNY Honey Crit Care Med Vol 198, Iss 5, [phone], Feb 11 2017. 2. Cylindrical bronchiectasis. 3. Chronic gastric wall thickening. 4. Aortic atherosclerosis (ICD10-I70.0). Coronary artery calcification. 5. Enlarged pulmonic trunk, indicative of pulmonary arterial hypertension. Electronically Signed   By: Newell Eke M.D.   On: 10/04/2023 13:47       Assessment & Plan:  Health care maintenance Assessment & Plan: Had physical  08/24/23.  Appt today changed to f/u. Colonoscopy 01/2018.  Recommended f/u in 5 years.  Mammogram 04/19/23- Briads I.  Bone density 03/2021 - osteopenia.  Continue calcium , vitamin D and weight bearing exercise.  Mammogram and bone density 04/2024.    Hypercholesteremia Assessment & Plan: Continues crestor . Low cholesterol diet and exercise. Follow lipid panel.   Lab Results  Component Value Date   CHOL 190 12/26/2023   HDL 63.30 12/26/2023   LDLCALC 108 (H) 12/26/2023   LDLDIRECT 123.4 02/27/2013   TRIG 94.0 12/26/2023   CHOLHDL 3 12/26/2023     Orders: -     Basic metabolic panel with GFR; Future -     Lipid  panel; Future -     Hepatic function panel; Future -     CBC with Differential/Platelet; Future  Hyperglycemia Assessment & Plan: Low carb diet and exercise.  Follow met b and a1c.  Lab Results  Component Value Date   HGBA1C 5.5 12/26/2023     Orders: -     Basic metabolic panel with GFR; Future -     Hemoglobin A1c; Future  Aortic atherosclerosis (HCC) Assessment & Plan: Continue crestor .    Coronary artery disease involving native coronary artery of native heart without angina pectoris Assessment & Plan: Being followed by cardiology.  Continue risk factor modification. Continue crestor . No chest pain.    Bilateral carotid artery disease, unspecified type (HCC) Assessment & Plan: Carotid ultrasound - right: 40-59% and left:  1-39%.  Continue statin therapy.  Carotid ultrasound (04/2021) - recommended f/u carotid ultrasound every other year. Had f/u carotid ultrasound 04/2023 - no significant change. Continue risk factor modification.    Environmental allergies Assessment & Plan: Cough is better. Allergy testing Thursday.    Gastroesophageal reflux disease with esophagitis without hemorrhage Assessment & Plan: No upper symptoms reported. Continue PPI.    Stress Assessment & Plan: Overall appears to be handling things relatively well. Has buspar . Follow.    Hypothyroidism, unspecified type Assessment & Plan: On thyroid  replacement. Follow tsh.    AB (asthmatic bronchitis), mild persistent, uncomplicated Assessment & Plan: Breathing stable. Has airsupra  to use prn.    Interstitial lung disease (HCC) Assessment & Plan: Had  f/u with Dr Tamea - 09/28/23 - ari supra prn. No f/u CT recommended. Breathing stable.   Other orders -     Levothyroxine  Sodium; Take 1 tablet (50 mcg total) by mouth every other day.  Dispense: 45 tablet; Refill: 1     Allena Hamilton, MD

## 2024-01-01 NOTE — Assessment & Plan Note (Addendum)
 Had physical  08/24/23.  Appt today changed to f/u. Colonoscopy 01/2018.  Recommended f/u in 5 years.  Mammogram 04/19/23- Briads I.  Bone density 03/2021 - osteopenia.  Continue calcium , vitamin D and weight bearing exercise.  Mammogram and bone density 04/2024.

## 2024-01-04 DIAGNOSIS — R052 Subacute cough: Secondary | ICD-10-CM | POA: Diagnosis not present

## 2024-01-04 DIAGNOSIS — J300 Vasomotor rhinitis: Secondary | ICD-10-CM | POA: Diagnosis not present

## 2024-01-07 ENCOUNTER — Encounter: Payer: Self-pay | Admitting: Internal Medicine

## 2024-01-07 NOTE — Assessment & Plan Note (Signed)
 Cough is better. Allergy testing Thursday.

## 2024-01-07 NOTE — Assessment & Plan Note (Signed)
 Breathing stable. Has airsupra  to use prn.

## 2024-01-07 NOTE — Assessment & Plan Note (Signed)
 On thyroid replacement.  Follow tsh.

## 2024-01-07 NOTE — Assessment & Plan Note (Signed)
 Continue crestor

## 2024-01-07 NOTE — Assessment & Plan Note (Signed)
 Overall appears to be handling things relatively well. Has buspar . Follow.

## 2024-01-07 NOTE — Assessment & Plan Note (Signed)
 Continues crestor . Low cholesterol diet and exercise. Follow lipid panel.   Lab Results  Component Value Date   CHOL 190 12/26/2023   HDL 63.30 12/26/2023   LDLCALC 108 (H) 12/26/2023   LDLDIRECT 123.4 02/27/2013   TRIG 94.0 12/26/2023   CHOLHDL 3 12/26/2023

## 2024-01-07 NOTE — Assessment & Plan Note (Signed)
 Carotid ultrasound - right: 40-59% and left:  1-39%.  Continue statin therapy.  Carotid ultrasound (04/2021) - recommended f/u carotid ultrasound every other year. Had f/u carotid ultrasound 04/2023 - no significant change. Continue risk factor modification.

## 2024-01-07 NOTE — Assessment & Plan Note (Signed)
 Had f/u with Dr Tamea - 09/28/23 - ari supra prn. No f/u CT recommended. Breathing stable.

## 2024-01-07 NOTE — Assessment & Plan Note (Signed)
 Being followed by cardiology.  Continue risk factor modification. Continue crestor. No chest pain.

## 2024-01-07 NOTE — Assessment & Plan Note (Signed)
 Low carb diet and exercise.  Follow met b and a1c.  Lab Results  Component Value Date   HGBA1C 5.5 12/26/2023

## 2024-01-07 NOTE — Assessment & Plan Note (Signed)
No upper symptoms reported.  Continue PPI.  ?

## 2024-01-08 ENCOUNTER — Ambulatory Visit: Admitting: Physical Therapy

## 2024-01-08 DIAGNOSIS — M79671 Pain in right foot: Secondary | ICD-10-CM

## 2024-01-08 DIAGNOSIS — R2689 Other abnormalities of gait and mobility: Secondary | ICD-10-CM

## 2024-01-08 DIAGNOSIS — M79672 Pain in left foot: Secondary | ICD-10-CM

## 2024-01-08 DIAGNOSIS — M533 Sacrococcygeal disorders, not elsewhere classified: Secondary | ICD-10-CM | POA: Diagnosis not present

## 2024-01-08 DIAGNOSIS — M542 Cervicalgia: Secondary | ICD-10-CM

## 2024-01-08 DIAGNOSIS — M5459 Other low back pain: Secondary | ICD-10-CM
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
## 2024-01-08 NOTE — Patient Instructions (Addendum)
 Relaxation   Legs up the wall / restorative yoga to decrease foot swelling and pain  Ankle pumps

## 2024-01-10 DIAGNOSIS — Z85828 Personal history of other malignant neoplasm of skin: Secondary | ICD-10-CM | POA: Diagnosis not present

## 2024-01-10 DIAGNOSIS — L719 Rosacea, unspecified: Secondary | ICD-10-CM | POA: Diagnosis not present

## 2024-01-10 DIAGNOSIS — D485 Neoplasm of uncertain behavior of skin: Secondary | ICD-10-CM | POA: Diagnosis not present

## 2024-01-10 DIAGNOSIS — L814 Other melanin hyperpigmentation: Secondary | ICD-10-CM | POA: Diagnosis not present

## 2024-01-10 DIAGNOSIS — L570 Actinic keratosis: Secondary | ICD-10-CM | POA: Diagnosis not present

## 2024-01-10 DIAGNOSIS — L578 Other skin changes due to chronic exposure to nonionizing radiation: Secondary | ICD-10-CM | POA: Diagnosis not present

## 2024-01-11 ENCOUNTER — Other Ambulatory Visit: Payer: Self-pay | Admitting: Internal Medicine

## 2024-01-11 DIAGNOSIS — Z1231 Encounter for screening mammogram for malignant neoplasm of breast: Secondary | ICD-10-CM

## 2024-01-18 ENCOUNTER — Encounter: Admitting: Physical Therapy

## 2024-01-18 NOTE — Progress Notes (Signed)
" °  Cardiology Office Note   Date:  01/19/2024  ID:  [name], [name] 11/15/1945, MRN [id number] PCP: Glendia Shad, MD  [facility] Providers Cardiologist:  None   History of Present Illness [name] is a 78 y.o. female with a history of hyperlipidemia, GERD, COVID infection, aortic atherosclerosis, PAD, carotid artery disease bilaterally who is being seen for 1 year follow-up.  CT scan of the chest in 2023 showed significant aortic atherosclerosis, coronary atherosclerosis. Cardiac CTA in 06/2022 showed no CAD, coronary calcium  score of 0.  Patient was last seen November 2023 and was overall stable from a cardiac perspective.  Today, the patient reports she is overall doing well. She had COVID twice and was following the pulmonology for lung densities that are stable. She denies chest pain, breathing issues. She has chronic and stable lower leg edema. She is doing PT for arthritis. No lightheadedness or dizziness. No heart racing or palpitations.    Studies Reviewed EKG Interpretation Date/Time:  Friday January 19 2024 09:04:55 EDT Ventricular Rate:  52 PR Interval:  154 QRS Duration:  86 QT Interval:  428 QTC Calculation: 398 R Axis:   0  Text Interpretation: Sinus bradycardia Low voltage QRS Nonspecific ST abnormality When compared with ECG of 02-Jan-2009 08:16, Nonspecific T wave abnormality, improved in Anterior leads Confirmed by Franchester, [name] ([id number]) on 01/19/2024 9:12:17 AM    Carotid duplex 04/2023 Summary:  Right Carotid: The extracranial vessels were near-normal with only minimal  wall                thickening or plaque.   Left Carotid: The extracranial vessels were near-normal with only minimal  wall               thickening or plaque.   Coronary CTA 06/2022 IMPRESSION: 1. Normal coronary calcium  score of 0.  Patient is low risk.   2. Normal coronary origin with right dominance.   3. No evidence of CAD.   4. CAD-RADS 0. Consider  non-atherosclerotic causes of chest pain.       Physical Exam VS:  BP 120/64 (BP Location: Left Arm, Patient Position: Sitting, Cuff Size: Normal)   Pulse (!) 52   Ht 5' 3 (1.6 m)   Wt 142 lb (64.4 kg)   SpO2 97%   BMI 25.15 kg/m        Wt Readings from Last 3 Encounters:  01/19/24 142 lb (64.4 kg)  01/01/24 141 lb 9.6 oz (64.2 kg)  11/09/23 141 lb 9.6 oz (64.2 kg)    GEN: Well nourished, well developed in no acute distress NECK: No JVD; No carotid bruits CARDIAC: RR, bradycardia, no murmurs, rubs, gallops RESPIRATORY:  Clear to auscultation without rales, wheezing or rhonchi  ABDOMEN: Soft, non-tender, non-distended EXTREMITIES:  No edema; No deformity   ASSESSMENT AND PLAN  Carotid artery disease She denies neurological symptoms. Carotid US  04/2023 showed stable disease. Unable to take ASA due to GI issues.   Aortic atherosclerosis Seen on Chest CT. Continue Crestor  10mg  daily.   HLD LDL 108, goal<70. She did not tolerate higher does of Crestor , Repatha  or Zetia . Continue Crestor  10mg  daily. Recommend diet changes       Dispo: Follow-up in 1 year  Signed, Sunshyne Horvath VEAR Franchester, PA-C   "

## 2024-01-19 ENCOUNTER — Encounter: Payer: Self-pay | Admitting: Medical

## 2024-01-19 ENCOUNTER — Ambulatory Visit: Attending: Medical | Admitting: Medical

## 2024-01-19 VITALS — BP 120/64 | HR 52 | Ht 63.0 in | Wt 142.0 lb

## 2024-01-19 DIAGNOSIS — I779 Disorder of arteries and arterioles, unspecified: Secondary | ICD-10-CM | POA: Diagnosis not present

## 2024-01-19 DIAGNOSIS — I700 Atherosclerosis of aorta: Secondary | ICD-10-CM

## 2024-01-19 DIAGNOSIS — E782 Mixed hyperlipidemia: Secondary | ICD-10-CM | POA: Diagnosis not present

## 2024-01-19 NOTE — Patient Instructions (Signed)
 Medication Instructions:  Your physician recommends that you continue on your current medications as directed. Please refer to the Current Medication list given to you today.   *If you need a refill on your cardiac medications before your next appointment, please call your pharmacy*  Lab Work: No labs ordered today   Testing/Procedures: No test ordered today   Follow-Up: At [facility], you and your health needs are our priority.  As part of our continuing mission to provide you with exceptional heart care, our providers are all part of one team.  This team includes your primary Cardiologist (physician) and Advanced Practice Providers or APPs (Physician Assistants and Nurse Practitioners) who all work together to provide you with the care you need, when you need it.  Your next appointment:   1 year(s)  Provider:   Toribio Frees, PA-C

## 2024-01-22 ENCOUNTER — Ambulatory Visit: Attending: Internal Medicine | Admitting: Physical Therapy

## 2024-01-22 DIAGNOSIS — M542 Cervicalgia: Secondary | ICD-10-CM | POA: Insufficient documentation

## 2024-01-22 DIAGNOSIS — M5459 Other low back pain: Secondary | ICD-10-CM | POA: Insufficient documentation

## 2024-01-22 DIAGNOSIS — M533 Sacrococcygeal disorders, not elsewhere classified: Secondary | ICD-10-CM | POA: Diagnosis present

## 2024-01-22 DIAGNOSIS — R2689 Other abnormalities of gait and mobility: Secondary | ICD-10-CM | POA: Diagnosis present

## 2024-01-22 DIAGNOSIS — M79671 Pain in right foot: Secondary | ICD-10-CM | POA: Diagnosis not present

## 2024-01-22 DIAGNOSIS — M79672 Pain in left foot: Secondary | ICD-10-CM | POA: Diagnosis present

## 2024-01-23 NOTE — Patient Instructions (Signed)
 Relaxation practice during the week instead of joining a 3rd yoga class per week
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
## 2024-01-26 DIAGNOSIS — M19071 Primary osteoarthritis, right ankle and foot: Secondary | ICD-10-CM | POA: Diagnosis not present

## 2024-01-26 DIAGNOSIS — M19072 Primary osteoarthritis, left ankle and foot: Secondary | ICD-10-CM | POA: Diagnosis not present

## 2024-01-26 DIAGNOSIS — M79671 Pain in right foot: Secondary | ICD-10-CM | POA: Diagnosis not present

## 2024-02-01 DIAGNOSIS — H6123 Impacted cerumen, bilateral: Secondary | ICD-10-CM | POA: Diagnosis not present

## 2024-02-01 DIAGNOSIS — R591 Generalized enlarged lymph nodes: Secondary | ICD-10-CM | POA: Diagnosis not present

## 2024-02-01 DIAGNOSIS — H903 Sensorineural hearing loss, bilateral: Secondary | ICD-10-CM | POA: Diagnosis not present

## 2024-02-01 DIAGNOSIS — H6063 Unspecified chronic otitis externa, bilateral: Secondary | ICD-10-CM | POA: Diagnosis not present

## 2024-02-13 ENCOUNTER — Encounter: Payer: Self-pay | Admitting: Internal Medicine

## 2024-02-13 NOTE — Telephone Encounter (Signed)
 Supplement: QUNOL lIQYID Turmeric ( 15ml / 1Tbsp. per day)   Patient is wanting to know if this is ok for her to take

## 2024-02-14 NOTE — Telephone Encounter (Signed)
 If she takes, would recommend monitoring for GI side effects - worsening reflux, etc.

## 2024-03-06 ENCOUNTER — Encounter: Payer: Self-pay | Admitting: Internal Medicine

## 2024-03-06 DIAGNOSIS — M25579 Pain in unspecified ankle and joints of unspecified foot: Secondary | ICD-10-CM | POA: Insufficient documentation

## 2024-03-06 DIAGNOSIS — M19071 Primary osteoarthritis, right ankle and foot: Secondary | ICD-10-CM | POA: Diagnosis not present

## 2024-03-14 ENCOUNTER — Ambulatory Visit: Admitting: Physical Therapy

## 2024-03-22 DIAGNOSIS — H0288B Meibomian gland dysfunction left eye, upper and lower eyelids: Secondary | ICD-10-CM | POA: Diagnosis not present

## 2024-03-22 DIAGNOSIS — H00022 Hordeolum internum right lower eyelid: Secondary | ICD-10-CM | POA: Diagnosis not present

## 2024-03-22 DIAGNOSIS — H0288A Meibomian gland dysfunction right eye, upper and lower eyelids: Secondary | ICD-10-CM | POA: Diagnosis not present

## 2024-03-22 DIAGNOSIS — H0015 Chalazion left lower eyelid: Secondary | ICD-10-CM | POA: Diagnosis not present

## 2024-04-03 ENCOUNTER — Other Ambulatory Visit (INDEPENDENT_AMBULATORY_CARE_PROVIDER_SITE_OTHER)

## 2024-04-03 DIAGNOSIS — R739 Hyperglycemia, unspecified: Secondary | ICD-10-CM

## 2024-04-03 DIAGNOSIS — E7800 Pure hypercholesterolemia, unspecified: Secondary | ICD-10-CM

## 2024-04-03 LAB — HEPATIC FUNCTION PANEL
ALT: 16 U/L (ref 0–35)
AST: 21 U/L (ref 0–37)
Albumin: 4.6 g/dL (ref 3.5–5.2)
Alkaline Phosphatase: 53 U/L (ref 39–117)
Bilirubin, Direct: 0.1 mg/dL (ref 0.0–0.3)
Total Bilirubin: 0.7 mg/dL (ref 0.2–1.2)
Total Protein: 6.6 g/dL (ref 6.0–8.3)

## 2024-04-03 LAB — CBC WITH DIFFERENTIAL/PLATELET
Basophils Absolute: 0 K/uL (ref 0.0–0.1)
Basophils Relative: 0.6 % (ref 0.0–3.0)
Eosinophils Absolute: 0.1 K/uL (ref 0.0–0.7)
Eosinophils Relative: 1.8 % (ref 0.0–5.0)
HCT: 36.7 % (ref 36.0–46.0)
Hemoglobin: 12.8 g/dL (ref 12.0–15.0)
Lymphocytes Relative: 35 % (ref 12.0–46.0)
Lymphs Abs: 1.5 K/uL (ref 0.7–4.0)
MCHC: 34.8 g/dL (ref 30.0–36.0)
MCV: 95.1 fl (ref 78.0–100.0)
Monocytes Absolute: 0.4 K/uL (ref 0.1–1.0)
Monocytes Relative: 8.9 % (ref 3.0–12.0)
Neutro Abs: 2.3 K/uL (ref 1.4–7.7)
Neutrophils Relative %: 53.7 % (ref 43.0–77.0)
Platelets: 219 K/uL (ref 150.0–400.0)
RBC: 3.86 Mil/uL — ABNORMAL LOW (ref 3.87–5.11)
RDW: 12.4 % (ref 11.5–15.5)
WBC: 4.2 K/uL (ref 4.0–10.5)

## 2024-04-03 LAB — LIPID PANEL
Cholesterol: 182 mg/dL (ref 0–200)
HDL: 64.8 mg/dL
LDL Cholesterol: 97 mg/dL (ref 0–99)
NonHDL: 117.45
Total CHOL/HDL Ratio: 3
Triglycerides: 102 mg/dL (ref 0.0–149.0)
VLDL: 20.4 mg/dL (ref 0.0–40.0)

## 2024-04-03 LAB — BASIC METABOLIC PANEL WITH GFR
BUN: 12 mg/dL (ref 6–23)
CO2: 32 meq/L (ref 19–32)
Calcium: 9.3 mg/dL (ref 8.4–10.5)
Chloride: 103 meq/L (ref 96–112)
Creatinine, Ser: 0.67 mg/dL (ref 0.40–1.20)
GFR: 83.34 mL/min
Glucose, Bld: 84 mg/dL (ref 70–99)
Potassium: 4.4 meq/L (ref 3.5–5.1)
Sodium: 143 meq/L (ref 135–145)

## 2024-04-03 LAB — HEMOGLOBIN A1C: Hgb A1c MFr Bld: 5.5 % (ref 4.6–6.5)

## 2024-04-04 ENCOUNTER — Ambulatory Visit: Payer: Self-pay | Admitting: Internal Medicine

## 2024-04-09 ENCOUNTER — Encounter: Payer: Self-pay | Admitting: Internal Medicine

## 2024-04-09 ENCOUNTER — Ambulatory Visit: Admitting: Internal Medicine

## 2024-04-09 VITALS — BP 122/76 | HR 63 | Temp 97.8°F | Ht 63.0 in | Wt 138.0 lb

## 2024-04-09 DIAGNOSIS — E039 Hypothyroidism, unspecified: Secondary | ICD-10-CM

## 2024-04-09 DIAGNOSIS — I2510 Atherosclerotic heart disease of native coronary artery without angina pectoris: Secondary | ICD-10-CM | POA: Diagnosis not present

## 2024-04-09 DIAGNOSIS — E7800 Pure hypercholesterolemia, unspecified: Secondary | ICD-10-CM | POA: Diagnosis not present

## 2024-04-09 DIAGNOSIS — Z23 Encounter for immunization: Secondary | ICD-10-CM | POA: Diagnosis not present

## 2024-04-09 DIAGNOSIS — I779 Disorder of arteries and arterioles, unspecified: Secondary | ICD-10-CM

## 2024-04-09 DIAGNOSIS — F439 Reaction to severe stress, unspecified: Secondary | ICD-10-CM | POA: Diagnosis not present

## 2024-04-09 DIAGNOSIS — N816 Rectocele: Secondary | ICD-10-CM

## 2024-04-09 DIAGNOSIS — R739 Hyperglycemia, unspecified: Secondary | ICD-10-CM | POA: Diagnosis not present

## 2024-04-09 DIAGNOSIS — J849 Interstitial pulmonary disease, unspecified: Secondary | ICD-10-CM | POA: Diagnosis not present

## 2024-04-09 DIAGNOSIS — K2100 Gastro-esophageal reflux disease with esophagitis, without bleeding: Secondary | ICD-10-CM

## 2024-04-09 DIAGNOSIS — M79671 Pain in right foot: Secondary | ICD-10-CM | POA: Diagnosis not present

## 2024-04-09 MED ORDER — BUSPIRONE HCL 5 MG PO TABS: 5.0000 mg | ORAL_TABLET | Freq: Every day | ORAL | 0 refills | Status: AC | PRN

## 2024-04-09 MED ORDER — LEVOTHYROXINE SODIUM 50 MCG PO TABS: 50.0000 ug | ORAL_TABLET | ORAL | 1 refills | Status: AC

## 2024-04-09 NOTE — Assessment & Plan Note (Signed)
 Continues crestor . Low cholesterol diet and exercise. Follow lipid panel.   Lab Results  Component Value Date   CHOL 182 04/03/2024   HDL 64.80 04/03/2024   LDLCALC 97 04/03/2024   LDLDIRECT 123.4 02/27/2013   TRIG 102.0 04/03/2024   CHOLHDL 3 04/03/2024

## 2024-04-09 NOTE — Progress Notes (Unsigned)
 Subjective:    Patient ID: [name], female    DOB: 12/22/1945, 78 y.o.   MRN: [id number]  Patient here for a scheduled follow up   HPI Here for a scheduled follow up - follow up regarding GERD, hypercholesterolemia and hypothyroidism. Going to PFPT - rectocele, pelvic pain and low back pain. Had f/u with rheumatology 11/14/23 - f/u positive ANA centromere pattern with elevated SSA antibody. Dry eyes. Felt exam most c/w OA. Had f/u with Dr Tamea - 09/28/23 - air supra prn. No f/u CT recommended. Breathing stable. Had f/u with cardiology 01/19/24 - carotid ultrasound 04/2023 - stable. Recommended to continue crestor  10mg  q day. Evaluated [facility] - right foot pain - felt to be arthritis. Recommended cortisone injection. Injection helped. Has f/u 05/2024.    Past Medical History:  Diagnosis Date   Arthritis    Environmental allergies    Esophagitis    [place], followed by Dr Ulysees Sous   GERD (gastroesophageal reflux disease)    Hypercholesterolemia    Hypothyroidism    Past Surgical History:  Procedure Laterality Date   ANKLE SURGERY  06/13/1965   cataract surgery Right 02/2023   cataract surgery Left 02/2023   KNEE ARTHROSCOPY  12/03/2007   left   KNEE ARTHROSCOPY  06/25/2008   left   NASAL SINUS SURGERY  05/13/2005   REPLACEMENT TOTAL KNEE  01/04/2009   left   RHINOPLASTY  06/13/1984   VAGINAL HYSTERECTOMY  06/13/1978   ovaries left in place   WRIST SURGERY  06/13/1968   cyst removal   Family History  Problem Relation Age of Onset   Pulmonary disease Mother    Heart disease Father        heart attack   Diabetes Father    Multiple myeloma Brother    Breast cancer Maternal Aunt    Breast cancer Maternal Aunt    Prostate cancer Maternal Uncle    Hyperlipidemia Paternal Grandmother    Diabetes Paternal Grandmother    Stroke Paternal Grandmother    Hyperlipidemia Paternal Grandfather    Diabetes Paternal Grandfather    Pancreatic cancer Cousin    Social  History   Socioeconomic History   Marital status: Married    Spouse name: Not on file   Number of children: Not on file   Years of education: Not on file   Highest education level: Not on file  Occupational History   Not on file  Tobacco Use   Smoking status: Former    Current packs/day: 0.25    Average packs/day: 0.3 packs/day for 5.0 years (1.3 ttl pk-yrs)    Types: Cigarettes   Smokeless tobacco: Never   Tobacco comments:    Smoked a pack a week in her early 20's. Smoked for 5 years.  Vaping Use   Vaping status: Never Used  Substance and Sexual Activity   Alcohol use: Never    Comment: rare   Drug use: No   Sexual activity: Yes  Other Topics Concern   Not on file  Social History Narrative   married   Social Drivers of Corporate Investment Banker Strain: Low Risk  (04/24/2023)   Overall Financial Resource Strain (CARDIA)    Difficulty of Paying Living Expenses: Not hard at all  Food Insecurity: No Food Insecurity (04/24/2023)   Hunger Vital Sign    Worried About Running Out of Food in the Last Year: Never true    Ran Out of Food in the Last Year:  Never true  Transportation Needs: No Transportation Needs (04/24/2023)   PRAPARE - Administrator, Civil Service (Medical): No    Lack of Transportation (Non-Medical): No  Physical Activity: Insufficiently Active (04/24/2023)   Exercise Vital Sign    Days of Exercise per Week: 2 days    Minutes of Exercise per Session: 60 min  Stress: No Stress Concern Present (04/24/2023)   [employer] of Occupational Health - Occupational Stress Questionnaire    Feeling of Stress : Only a little  Social Connections: Socially Integrated (04/24/2023)   Social Connection and Isolation Panel    Frequency of Communication with Friends and Family: More than three times a week    Frequency of Social Gatherings with Friends and Family: More than three times a week    Attends Religious Services: More than 4 times per year     Active Member of [employer] or Organizations: Yes    Attends Engineer, Structural: More than 4 times per year    Marital Status: Married     Review of Systems  Constitutional:  Negative for appetite change and unexpected weight change.  HENT:  Negative for congestion and sinus pressure.   Respiratory:  Negative for cough, chest tightness and shortness of breath.   Cardiovascular:  Negative for chest pain, palpitations and leg swelling.  Gastrointestinal:  Negative for abdominal pain, diarrhea, nausea and vomiting.  Genitourinary:  Negative for difficulty urinating and dysuria.  Musculoskeletal:  Negative for joint swelling and myalgias.       Foot pain as outlined. Injection helped.   Skin:  Negative for color change and rash.  Neurological:  Negative for dizziness and headaches.  Psychiatric/Behavioral:  Negative for agitation and dysphoric mood.        Objective:     BP 122/76   Pulse 63   Temp 97.8 F (36.6 C) (Oral)   Ht 5' 3 (1.6 m)   Wt 138 lb (62.6 kg)   SpO2 98%   BMI 24.45 kg/m  Wt Readings from Last 3 Encounters:  04/09/24 138 lb (62.6 kg)  01/19/24 142 lb (64.4 kg)  01/01/24 141 lb 9.6 oz (64.2 kg)    Physical Exam Vitals reviewed.  Constitutional:      General: She is not in acute distress.    Appearance: Normal appearance.  HENT:     Head: Normocephalic and atraumatic.     Right Ear: External ear normal.     Left Ear: External ear normal.     Mouth/Throat:     Pharynx: No oropharyngeal exudate or posterior oropharyngeal erythema.  Eyes:     General: No scleral icterus.       Right eye: No discharge.        Left eye: No discharge.     Conjunctiva/sclera: Conjunctivae normal.  Neck:     Thyroid : No thyromegaly.  Cardiovascular:     Rate and Rhythm: Normal rate and regular rhythm.  Pulmonary:     Effort: No respiratory distress.     Breath sounds: Normal breath sounds. No wheezing.  Abdominal:     General: Bowel sounds are normal.      Palpations: Abdomen is soft.     Tenderness: There is no abdominal tenderness.  Musculoskeletal:        General: No swelling or tenderness.     Cervical back: Neck supple. No tenderness.  Lymphadenopathy:     Cervical: No cervical adenopathy.  Skin:    Findings: No erythema  or rash.  Neurological:     Mental Status: She is alert.  Psychiatric:        Mood and Affect: Mood normal.        Behavior: Behavior normal.     {Perform Simple Foot Exam  Perform Detailed exam:1} {Insert foot Exam (Optional):30965}   Outpatient Encounter Medications as of 04/09/2024  Medication Sig   acetaminophen (TYLENOL) 500 MG tablet Take 500 mg by mouth every 6 (six) hours as needed.   Albuterol -Budesonide (AIRSUPRA ) 90-80 MCG/ACT AERO Inhale 2 puffs into the lungs every 6 (six) hours as needed (Shortness of breath, wheezing, cough).   Ascorbic Acid (VITAMIN C) 500 MG CAPS Take 500 mg by mouth daily.   B Complex Vitamins (VITAMIN B-COMPLEX) TABS Take 1 tablet by mouth daily.   B Complex-Folic Acid (SUPER B COMPLEX MAXI PO) Take 2 tablets by mouth daily at 2 am.   Cholecalciferol (VITAMIN D PO) Take 10,000 Units by mouth daily.    Coenzyme Q10 (COQ-10 PO) Take 120 mg by mouth daily.   COLLAGEN PO Take 11 mg by mouth daily.   estradiol (ESTRACE) 0.1 MG/GM vaginal cream Place 1 Applicatorful vaginally 3 (three) times a week.   fexofenadine (ALLEGRA) 180 MG tablet Take 180 mg by mouth daily.   Hypertonic Nasal Wash (SINUS RINSE NA) Place into the nose as needed.   levothyroxine  (SYNTHROID ) 75 MCG tablet Take 1 tablet (75 mcg total) by mouth every other day.   Omega-3 Fatty Acids (FISH OIL PO) Take 1,200 mg by mouth daily.   omeprazole  (PRILOSEC) 40 MG capsule Take 1 capsule (40 mg total) by mouth daily.   Probiotic Product (PROBIOTIC DAILY PO) Take 1 capsule by mouth.   rosuvastatin  (CRESTOR ) 10 MG tablet Take 1 tablet (10 mg total) by mouth daily.   sodium chloride  irrigation 0.9 % irrigation sodium  chloride 0.9 % irrigation solution   Sodium Chloride -Xylitol (XLEAR SINUS CARE SPRAY NA) Place into the nose as needed.   vitamin E 400 UNIT capsule Take 400 Units by mouth daily.   budesonide (PULMICORT) 0.5 MG/2ML nebulizer solution Take 0.5 mg by nebulization 2 (two) times daily.   busPIRone  (BUSPAR ) 5 MG tablet Take 1 tablet (5 mg total) by mouth daily as needed.   levothyroxine  (SYNTHROID ) 50 MCG tablet Take 1 tablet (50 mcg total) by mouth every other day.   [DISCONTINUED] busPIRone  (BUSPAR ) 5 MG tablet Take 1 tablet (5 mg total) by mouth daily as needed.   [DISCONTINUED] levothyroxine  (SYNTHROID ) 50 MCG tablet Take 1 tablet (50 mcg total) by mouth every other day.   No facility-administered encounter medications on file as of 04/09/2024.     Lab Results  Component Value Date   WBC 4.2 04/03/2024   HGB 12.8 04/03/2024   HCT 36.7 04/03/2024   PLT 219.0 04/03/2024   GLUCOSE 84 04/03/2024   CHOL 182 04/03/2024   TRIG 102.0 04/03/2024   HDL 64.80 04/03/2024   LDLDIRECT 123.4 02/27/2013   LDLCALC 97 04/03/2024   ALT 16 04/03/2024   AST 21 04/03/2024   NA 143 04/03/2024   K 4.4 04/03/2024   CL 103 04/03/2024   CREATININE 0.67 04/03/2024   BUN 12 04/03/2024   CO2 32 04/03/2024   TSH 0.63 12/26/2023   HGBA1C 5.5 04/03/2024    CT CHEST HIGH RESOLUTION Result Date: 10/04/2023 CLINICAL DATA:  Nodule, interstitial lung disease. EXAM: CT CHEST WITHOUT CONTRAST TECHNIQUE: Multidetector CT imaging of the chest was performed following the standard protocol without  intravenous contrast. High resolution imaging of the lungs, as well as inspiratory and expiratory imaging, was performed. RADIATION DOSE REDUCTION: This exam was performed according to the departmental dose-optimization program which includes automated exposure control, adjustment of the mA and/or kV according to patient size and/or use of iterative reconstruction technique. COMPARISON:  09/14/2022, 03/09/2022. FINDINGS:  Cardiovascular: Atherosclerotic calcification of the aorta, aortic valve and coronary arteries. Enlarged pulmonic trunk and heart. No pericardial effusion. Mediastinum/Nodes: No pathologically enlarged mediastinal or axillary lymph nodes. Hilar regions are difficult to definitively evaluate without IV contrast. Esophagus is grossly unremarkable. Lungs/Pleura: Cylindrical bronchiectasis. Scattered mucoid impaction. Mild bibasilar subpleural reticular densities and ground-glass possibly with mild traction bronchiolectasis. Findings appear unchanged from 09/14/2022. Chronic subpleural mucoid impaction in the lateral left lower lobe (4/129). No pleural fluid. Airway is unremarkable. No air trapping. Upper Abdomen: Partially imaged low-attenuation lesion in the right kidney. No specific follow-up necessary. Gastric wall thickening, unchanged. Visualized portions of the liver, adrenal glands, kidneys, spleen, pancreas and stomach are otherwise grossly unremarkable. No upper abdominal adenopathy. Musculoskeletal: Degenerative changes in the spine. IMPRESSION: 1. Very mild basilar subpleural reticular densities and ground-glass possibly with mild traction bronchiolectasis, unchanged from 09/14/2022. Findings are categorized as probable UIP per consensus guidelines: Diagnosis of Idiopathic Pulmonary Fibrosis: An Official ATS/ERS/JRS/ALAT Clinical Practice Guideline. Am JINNY Honey Crit Care Med Vol 198, Iss 5, [phone], Feb 11 2017. 2. Cylindrical bronchiectasis. 3. Chronic gastric wall thickening. 4. Aortic atherosclerosis (ICD10-I70.0). Coronary artery calcification. 5. Enlarged pulmonic trunk, indicative of pulmonary arterial hypertension. Electronically Signed   By: Newell Eke M.D.   On: 10/04/2023 13:47       Assessment & Plan:  Need for influenza vaccination -     Flu vaccine HIGH DOSE PF(Fluzone Trivalent)  Hypercholesteremia Assessment & Plan: Continues crestor . Low cholesterol diet and exercise. Follow  lipid panel.   Lab Results  Component Value Date   CHOL 182 04/03/2024   HDL 64.80 04/03/2024   LDLCALC 97 04/03/2024   LDLDIRECT 123.4 02/27/2013   TRIG 102.0 04/03/2024   CHOLHDL 3 04/03/2024     Orders: -     Basic metabolic panel with GFR; Future -     Lipid panel; Future -     Hepatic function panel; Future  Hyperglycemia Assessment & Plan: Low carb diet and exercise.  Follow met b and A1c.  Lab Results  Component Value Date   HGBA1C 5.5 04/03/2024     Orders: -     Basic metabolic panel with GFR; Future -     Hemoglobin A1c; Future  Hypothyroidism, unspecified type Assessment & Plan: On thyroid  replacement. Follow tsh.   Orders: -     TSH; Future  Stress Assessment & Plan: Overall appears to be handling things relatively well. Has buspar  if needed.    Right foot pain Assessment & Plan: Saw podiatry. S/p injection. Helped.    Rectocele Assessment & Plan: PFPT - exercises. Follow    Interstitial lung disease [facility]) Assessment & Plan: Had f/u with Dr Tamea - 09/28/23 - ari supra prn. No f/u CT recommended. Breathing stable.    Gastroesophageal reflux disease with esophagitis without hemorrhage Assessment & Plan: Continue PPI. Upper symptoms controlled.    Coronary artery disease involving native coronary artery of native heart without angina pectoris Assessment & Plan: Being followed by cardiology.  Continue risk factor modification. Continue crestor . No chest pain.    Bilateral carotid artery disease, unspecified type Assessment & Plan: Carotid ultrasound - right: 40-59% and  left:  1-39%.  Continue statin therapy.  Carotid ultrasound (04/2021) - recommended f/u carotid ultrasound every other year. Had f/u carotid ultrasound 04/2023 - no significant change. Continue risk factor modification.    Other orders -     busPIRone  HCl; Take 1 tablet (5 mg total) by mouth daily as needed.  Dispense: 30 tablet; Refill: 0 -     Levothyroxine  Sodium;  Take 1 tablet (50 mcg total) by mouth every other day.  Dispense: 45 tablet; Refill: 1     Allena Hamilton, MD

## 2024-04-09 NOTE — Progress Notes (Deleted)
   Acute Office Visit  Subjective:     Patient ID: [name], female    DOB: 12/15/1945, 78 y.o.   MRN: [id number]  No chief complaint on file.   HPI Patient is in today for ***  ROS      Objective:    There were no vitals taken for this visit. {Vitals History (Optional):23777}  Physical Exam  No results found for any visits on 04/09/24.      Assessment & Plan:   Problem List Items Addressed This Visit       Endocrine   Hypothyroidism   Hyperglycemia     Other   Hypercholesteremia    No orders of the defined types were placed in this encounter.   No follow-ups on file.  Karen Farr, CMA

## 2024-04-12 DIAGNOSIS — H0288A Meibomian gland dysfunction right eye, upper and lower eyelids: Secondary | ICD-10-CM | POA: Diagnosis not present

## 2024-04-12 DIAGNOSIS — H0288B Meibomian gland dysfunction left eye, upper and lower eyelids: Secondary | ICD-10-CM | POA: Diagnosis not present

## 2024-04-14 ENCOUNTER — Encounter: Payer: Self-pay | Admitting: Internal Medicine

## 2024-04-14 NOTE — Assessment & Plan Note (Signed)
 Saw podiatry. S/p injection. Helped.

## 2024-04-14 NOTE — Assessment & Plan Note (Signed)
 Overall appears to be handling things relatively well. Has buspar  if needed.

## 2024-04-14 NOTE — Assessment & Plan Note (Signed)
 On thyroid replacement.  Follow tsh.

## 2024-04-14 NOTE — Assessment & Plan Note (Signed)
 PFPT - exercises. Follow

## 2024-04-14 NOTE — Assessment & Plan Note (Signed)
 Being followed by cardiology.  Continue risk factor modification. Continue crestor. No chest pain.

## 2024-04-14 NOTE — Assessment & Plan Note (Signed)
 Continue PPI. Upper symptoms controlled.

## 2024-04-14 NOTE — Assessment & Plan Note (Signed)
 Had f/u with Dr Tamea - 09/28/23 - ari supra prn. No f/u CT recommended. Breathing stable.

## 2024-04-14 NOTE — Assessment & Plan Note (Signed)
 Low carb diet and exercise.  Follow met b and A1c.  Lab Results  Component Value Date   HGBA1C 5.5 04/03/2024

## 2024-04-14 NOTE — Assessment & Plan Note (Signed)
 Carotid ultrasound - right: 40-59% and left:  1-39%.  Continue statin therapy.  Carotid ultrasound (04/2021) - recommended f/u carotid ultrasound every other year. Had f/u carotid ultrasound 04/2023 - no significant change. Continue risk factor modification.

## 2024-04-23 ENCOUNTER — Ambulatory Visit
Admission: RE | Admit: 2024-04-23 | Discharge: 2024-04-23 | Disposition: A | Discharge status: Home / Self Care | Source: Ambulatory Visit | Attending: Internal Medicine | Admitting: Internal Medicine

## 2024-04-23 ENCOUNTER — Ambulatory Visit
Admission: RE | Admit: 2024-04-23 | Discharge: 2024-04-23 | Disposition: A | Source: Ambulatory Visit | Attending: Internal Medicine | Admitting: Internal Medicine

## 2024-04-23 DIAGNOSIS — Z1231 Encounter for screening mammogram for malignant neoplasm of breast: Secondary | ICD-10-CM | POA: Insufficient documentation

## 2024-04-23 DIAGNOSIS — M810 Age-related osteoporosis without current pathological fracture: Secondary | ICD-10-CM | POA: Diagnosis not present

## 2024-04-23 DIAGNOSIS — E2839 Other primary ovarian failure: Secondary | ICD-10-CM

## 2024-04-23 DIAGNOSIS — Z780 Asymptomatic menopausal state: Secondary | ICD-10-CM | POA: Diagnosis not present

## 2024-04-24 ENCOUNTER — Ambulatory Visit: Payer: Self-pay | Admitting: Internal Medicine

## 2024-04-24 NOTE — Telephone Encounter (Signed)
 Copied from CRM [id number]. Topic: Clinical - Lab/Test Results >> Apr 24, 2024 11:44 AM Mia F wrote: Reason for CRM: CMA called to relay done density results to pt. Per message e@c @ can relay results. [facility] is not allowed to relay any imaging results. Called [facility] to see if nurse was available but was put on hold for a while before speaking with anyone. Please call pt back with results. She says if she cannot be reached by phone to contact her through My Chart

## 2024-04-26 ENCOUNTER — Encounter: Payer: Self-pay | Admitting: Internal Medicine

## 2024-04-26 ENCOUNTER — Ambulatory Visit: Admitting: Internal Medicine

## 2024-04-26 VITALS — BP 110/70 | HR 54 | Temp 97.5°F | Ht 63.0 in | Wt 137.2 lb

## 2024-04-26 DIAGNOSIS — M810 Age-related osteoporosis without current pathological fracture: Secondary | ICD-10-CM

## 2024-04-26 DIAGNOSIS — E039 Hypothyroidism, unspecified: Secondary | ICD-10-CM

## 2024-04-26 DIAGNOSIS — R739 Hyperglycemia, unspecified: Secondary | ICD-10-CM | POA: Diagnosis not present

## 2024-04-26 DIAGNOSIS — K2100 Gastro-esophageal reflux disease with esophagitis, without bleeding: Secondary | ICD-10-CM | POA: Diagnosis not present

## 2024-04-26 DIAGNOSIS — I2510 Atherosclerotic heart disease of native coronary artery without angina pectoris: Secondary | ICD-10-CM | POA: Diagnosis not present

## 2024-04-26 DIAGNOSIS — J849 Interstitial pulmonary disease, unspecified: Secondary | ICD-10-CM

## 2024-04-26 DIAGNOSIS — I779 Disorder of arteries and arterioles, unspecified: Secondary | ICD-10-CM | POA: Diagnosis not present

## 2024-04-26 DIAGNOSIS — F439 Reaction to severe stress, unspecified: Secondary | ICD-10-CM

## 2024-04-26 NOTE — Progress Notes (Signed)
 Subjective:    Patient ID: [name], female    DOB: 11-04-1945, 78 y.o.   MRN: [id number]  Patient here for  Chief Complaint  Patient presents with   Results    Discuss Bone Density results & treatment options    HPI Here for scheduled appt to discuss bone density results and treatment options. Discussed bone density results - T score -3.8 - left forearm. -2.3 right femoral neck. Discussed treatment - including oral bisphosphonates, reclast, prolia. With her GI issues, would recommend holding on oral medications. Discussed weight bearing exercise, calcium  and vitamin D. Overall doing relatively well. No chest pain or sob reported. Acid reflux appears to be controlled. No abdominal pain or bowel change reported.    Past Medical History:  Diagnosis Date   Arthritis    Environmental allergies    Esophagitis    Barrets, followed by Dr Ulysees Sous   GERD (gastroesophageal reflux disease)    Hypercholesterolemia    Hypothyroidism    Past Surgical History:  Procedure Laterality Date   ANKLE SURGERY  06/13/1965   cataract surgery Right 02/2023   cataract surgery Left 02/2023   KNEE ARTHROSCOPY  12/03/2007   left   KNEE ARTHROSCOPY  06/25/2008   left   NASAL SINUS SURGERY  05/13/2005   REPLACEMENT TOTAL KNEE  01/04/2009   left   RHINOPLASTY  06/13/1984   tooth implant  11/2022   VAGINAL HYSTERECTOMY  06/13/1978   ovaries left in place   WRIST SURGERY  06/13/1968   cyst removal   Family History  Problem Relation Age of Onset   Pulmonary disease Mother    Heart disease Father        heart attack   Diabetes Father    Multiple myeloma Brother    Breast cancer Maternal Aunt    Breast cancer Maternal Aunt    Prostate cancer Maternal Uncle    Hyperlipidemia Paternal Grandmother    Diabetes Paternal Grandmother    Stroke Paternal Grandmother    Hyperlipidemia Paternal Grandfather    Diabetes Paternal Grandfather    Pancreatic cancer Cousin    Social History    Socioeconomic History   Marital status: Married    Spouse name: Not on file   Number of children: Not on file   Years of education: Not on file   Highest education level: Not on file  Occupational History   Not on file  Tobacco Use   Smoking status: Former    Current packs/day: 0.25    Average packs/day: 0.3 packs/day for 5.0 years (1.3 ttl pk-yrs)    Types: Cigarettes   Smokeless tobacco: Never   Tobacco comments:    Smoked a pack a week in her early 20's. Smoked for 5 years.  Vaping Use   Vaping status: Never Used  Substance and Sexual Activity   Alcohol use: Never    Comment: rare   Drug use: No   Sexual activity: Yes  Other Topics Concern   Not on file  Social History Narrative   married   Social Drivers of Corporate Investment Banker Strain: Low Risk  (04/30/2024)   Overall Financial Resource Strain (CARDIA)    Difficulty of Paying Living Expenses: Not hard at all  Food Insecurity: No Food Insecurity (04/30/2024)   Hunger Vital Sign    Worried About Running Out of Food in the Last Year: Never true    Ran Out of Food in the Last Year: Never true  Transportation Needs: No Transportation Needs (04/30/2024)   PRAPARE - Administrator, Civil Service (Medical): No    Lack of Transportation (Non-Medical): No  Physical Activity: Insufficiently Active (04/30/2024)   Exercise Vital Sign    Days of Exercise per Week: 3 days    Minutes of Exercise per Session: 40 min  Stress: No Stress Concern Present (04/30/2024)   [employer] of Occupational Health - Occupational Stress Questionnaire    Feeling of Stress: Only a little  Social Connections: Socially Integrated (04/30/2024)   Social Connection and Isolation Panel    Frequency of Communication with Friends and Family: More than three times a week    Frequency of Social Gatherings with Friends and Family: More than three times a week    Attends Religious Services: More than 4 times per year    Active  Member of [employer] or Organizations: Yes    Attends Engineer, Structural: More than 4 times per year    Marital Status: Married     Review of Systems  Constitutional:  Negative for appetite change and unexpected weight change.  HENT:  Negative for congestion and sinus pressure.   Respiratory:  Negative for cough, chest tightness and shortness of breath.   Cardiovascular:  Negative for chest pain, palpitations and leg swelling.  Gastrointestinal:  Negative for abdominal pain, diarrhea, nausea and vomiting.  Genitourinary:  Negative for difficulty urinating and dysuria.  Musculoskeletal:  Negative for joint swelling and myalgias.  Skin:  Negative for color change and rash.  Neurological:  Negative for dizziness and headaches.  Psychiatric/Behavioral:  Negative for agitation and dysphoric mood.        Objective:     BP 110/70   Pulse (!) 54   Temp (!) 97.5 F (36.4 C) (Oral)   Wt 137 lb 4 oz (62.3 kg)   SpO2 99%   BMI 24.31 kg/m  Wt Readings from Last 3 Encounters:  04/30/24 137 lb (62.1 kg)  04/26/24 137 lb 4 oz (62.3 kg)  04/09/24 138 lb (62.6 kg)    Physical Exam Vitals reviewed.  Constitutional:      General: She is not in acute distress.    Appearance: Normal appearance.  HENT:     Head: Normocephalic and atraumatic.     Right Ear: External ear normal.     Left Ear: External ear normal.     Mouth/Throat:     Pharynx: No oropharyngeal exudate or posterior oropharyngeal erythema.  Eyes:     General: No scleral icterus.       Right eye: No discharge.        Left eye: No discharge.     Conjunctiva/sclera: Conjunctivae normal.  Neck:     Thyroid : No thyromegaly.  Cardiovascular:     Rate and Rhythm: Normal rate and regular rhythm.  Pulmonary:     Effort: No respiratory distress.     Breath sounds: Normal breath sounds. No wheezing.  Abdominal:     General: Bowel sounds are normal.     Palpations: Abdomen is soft.     Tenderness: There is no abdominal  tenderness.  Musculoskeletal:        General: No swelling or tenderness.     Cervical back: Neck supple. No tenderness.  Lymphadenopathy:     Cervical: No cervical adenopathy.  Skin:    Findings: No erythema or rash.  Neurological:     Mental Status: She is alert.  Psychiatric:  Mood and Affect: Mood normal.        Behavior: Behavior normal.         Outpatient Encounter Medications as of 04/26/2024  Medication Sig   acetaminophen (TYLENOL) 500 MG tablet Take 500 mg by mouth every 6 (six) hours as needed.   Albuterol -Budesonide (AIRSUPRA ) 90-80 MCG/ACT AERO Inhale 2 puffs into the lungs every 6 (six) hours as needed (Shortness of breath, wheezing, cough).   Ascorbic Acid (VITAMIN C) 500 MG CAPS Take 500 mg by mouth daily.   B Complex-Folic Acid (SUPER B COMPLEX MAXI PO) Take 2 tablets by mouth daily at 2 am.   busPIRone  (BUSPAR ) 5 MG tablet Take 1 tablet (5 mg total) by mouth daily as needed.   Cholecalciferol (VITAMIN D PO) Take 10,000 Units by mouth daily.    Coenzyme Q10 (COQ-10 PO) Take 120 mg by mouth daily.   COLLAGEN PO Take 11 mg by mouth daily.   estradiol (ESTRACE) 0.1 MG/GM vaginal cream Place 1 Applicatorful vaginally 3 (three) times a week. (Patient taking differently: Place 1 Applicatorful vaginally once a week. Twice a week)   fexofenadine (ALLEGRA) 180 MG tablet Take 180 mg by mouth daily. (Patient taking differently: Take 180 mg by mouth daily as needed.)   Hypertonic Nasal Wash (SINUS RINSE NA) Place into the nose as needed.   levothyroxine  (SYNTHROID ) 50 MCG tablet Take 1 tablet (50 mcg total) by mouth every other day.   levothyroxine  (SYNTHROID ) 75 MCG tablet Take 1 tablet (75 mcg total) by mouth every other day.   Omega-3 Fatty Acids (FISH OIL PO) Take 1,200 mg by mouth daily. (Patient taking differently: Take 1,200 mg by mouth daily as needed.)   omeprazole  (PRILOSEC) 40 MG capsule Take 1 capsule (40 mg total) by mouth daily.   Probiotic Product  (PROBIOTIC DAILY PO) Take 1 capsule by mouth.   rosuvastatin  (CRESTOR ) 10 MG tablet Take 1 tablet (10 mg total) by mouth daily.   sodium chloride  irrigation 0.9 % irrigation sodium chloride  0.9 % irrigation solution   Sodium Chloride -Xylitol (XLEAR SINUS CARE SPRAY NA) Place into the nose as needed.   vitamin E 400 UNIT capsule Take 400 Units by mouth daily.   [DISCONTINUED] B Complex Vitamins (VITAMIN B-COMPLEX) TABS Take 1 tablet by mouth daily.   No facility-administered encounter medications on file as of 04/26/2024.     Lab Results  Component Value Date   WBC 4.2 04/03/2024   HGB 12.8 04/03/2024   HCT 36.7 04/03/2024   PLT 219.0 04/03/2024   GLUCOSE 84 04/03/2024   CHOL 182 04/03/2024   TRIG 102.0 04/03/2024   HDL 64.80 04/03/2024   LDLDIRECT 123.4 02/27/2013   LDLCALC 97 04/03/2024   ALT 16 04/03/2024   AST 21 04/03/2024   NA 143 04/03/2024   K 4.4 04/03/2024   CL 103 04/03/2024   CREATININE 0.67 04/03/2024   BUN 12 04/03/2024   CO2 32 04/03/2024   TSH 0.63 12/26/2023   HGBA1C 5.5 04/03/2024    MM 3D SCREENING MAMMOGRAM BILATERAL BREAST Result Date: 04/25/2024 CLINICAL DATA:  Screening. EXAM: DIGITAL SCREENING BILATERAL MAMMOGRAM WITH TOMOSYNTHESIS AND CAD TECHNIQUE: Bilateral screening digital craniocaudal and mediolateral oblique mammograms were obtained. Bilateral screening digital breast tomosynthesis was performed. The images were evaluated with computer-aided detection. COMPARISON:  Previous exam(s). ACR Breast Density Category c: The breasts are heterogeneously dense, which may obscure small masses. FINDINGS: There are no findings suspicious for malignancy. IMPRESSION: No mammographic evidence of malignancy. A result letter of  this screening mammogram will be mailed directly to the patient. RECOMMENDATION: Screening mammogram in one year. (Code:SM-B-01Y) BI-RADS CATEGORY  1: Negative. Electronically Signed   By: Norleen Croak M.D.   On: 04/25/2024 10:46   DG Bone  Density Result Date: 04/23/2024 EXAM: DUAL X-RAY ABSORPTIOMETRY (DXA) FOR BONE MINERAL DENSITY 04/23/2024 10:07 am CLINICAL DATA:  78 year old Female Postmenopausal. Estrogen deficiency History of fragility fracture. Patient is or has been on bone building therapies. TECHNIQUE: An axial (e.g., hips, spine) and/or appendicular (e.g., radius) exam was performed, as appropriate, using GE Secretary/administrator at [facility]. Images are obtained for bone mineral density measurement and are not obtained for diagnostic purposes. [id number] Exclusions: L1 due to degenerative change. COMPARISON:  04/08/2021. FINDINGS: Scan quality: Good. LUMBAR SPINE (L2-L4): BMD (in g/cm2): 1.017 T-score: -1.5 Z-score: 0.3 Rate of change from previous exam: 3.1 % LEFT FEMORAL NECK: BMD (in g/cm2): 0.769 T-score: -1.9 Z-score: 0.2 LEFT TOTAL HIP: BMD (in g/cm2): 0.793 T-score: -1.7 Z-score: 0.2 RIGHT FEMORAL NECK: BMD (in g/cm2): 0.713 T-score: -2.3 Z-score: -0.2 RIGHT TOTAL HIP: BMD (in g/cm2): 0.807 T-score: -1.6 Z-score: 0.4 DUAL-FEMUR TOTAL MEAN: Rate of change from previous exam: No significant rate of change from previous exam. LEFT FOREARM (RADIUS 33%): BMD (in g/cm2): 0.541 T-score: -3.8 Z-score: -1.2 FRAX 10-YEAR PROBABILITY OF FRACTURE: FRAX not reported as the lowest BMD is not in the osteopenia range. IMPRESSION: Osteoporosis based on BMD. Fracture risk is unknown due to history of bone building therapy. RECOMMENDATIONS: 1. All patients should optimize calcium  and vitamin D intake. 2. Consider FDA-approved medical therapies in postmenopausal women and men aged 59 years and older, based on the following: - A hip or vertebral (clinical or morphometric) fracture - T-score less than or equal to -2.5 and secondary causes have been excluded. - Low bone mass (T-score between -1.0 and -2.5) and a 10-year probability of a hip fracture greater than or equal to 3% or a 10-year probability of a major  osteoporosis-related fracture greater than or equal to 20% based on the [place] -adapted WHO algorithm. - Clinician judgment and/or patient preferences may indicate treatment for people with 10-year fracture probabilities above or below these levels 3. Patients with diagnosis of osteoporosis or at high risk for fracture should have regular bone mineral density tests. For patients eligible for Medicare, routine testing is allowed once every 2 years. The testing frequency can be increased to one year for patients who have rapidly progressing disease, those who are receiving or discontinuing medical therapy to restore bone mass, or have additional risk factors. Electronically Signed   By: Alm Parkins M.D.   On: 04/23/2024 11:07       Assessment & Plan:  Stress Assessment & Plan: Overall appears to be handling things relatively well. Has buspar  if needed. Follow.    Interstitial lung disease [facility]) Assessment & Plan: Had f/u with Dr Tamea - 09/28/23 - ari supra prn. No f/u CT recommended. Breathing stable.    Hypothyroidism, unspecified type Assessment & Plan: On thyroid  replacement. Follow tsh.    Hyperglycemia Assessment & Plan: Low carb diet and exercise.  Follow met b and A1c.  Lab Results  Component Value Date   HGBA1C 5.5 04/03/2024      Gastroesophageal reflux disease with esophagitis without hemorrhage Assessment & Plan: Continues omeprazole . Upper symptoms controlled.    Bilateral carotid artery disease, unspecified type Assessment & Plan: Carotid ultrasound - right: 40-59% and left:  1-39%.  Continue statin therapy.  Carotid ultrasound (04/2021) - recommended f/u carotid ultrasound every other year. Had f/u carotid ultrasound 04/2023 - no significant change. Continue risk factor modification.    Coronary artery disease involving native coronary artery of native heart without angina pectoris Assessment & Plan: Being followed by cardiology.  Continue risk factor  modification. Continue crestor . No chest pain.    Osteoporosis without current pathological fracture, unspecified osteoporosis type Assessment & Plan: Discussed bone density results. Discussed treatment options. Weight bearing exercise. Continue calcium  and vitamin D. Will notify me if desires further medication.       Allena Hamilton, MD

## 2024-04-26 NOTE — Patient Instructions (Signed)
 Caltrate 600mg . Take one tablet twice a day.   Continue vitamin D  Reclast - infusion treatment.

## 2024-04-30 ENCOUNTER — Ambulatory Visit (INDEPENDENT_AMBULATORY_CARE_PROVIDER_SITE_OTHER): Payer: Medicare HMO | Admitting: *Deleted

## 2024-04-30 VITALS — BP 123/74 | HR 60 | Ht 63.0 in | Wt 137.0 lb

## 2024-04-30 DIAGNOSIS — Z0000 Encounter for general adult medical examination without abnormal findings: Secondary | ICD-10-CM

## 2024-04-30 NOTE — Patient Instructions (Signed)
 [name],  Thank you for taking the time for your Medicare Wellness Visit. I appreciate your continued commitment to your health goals. Please review the care plan we discussed, and feel free to reach out if I can assist you further.  Please note that Annual Wellness Visits do not include a physical exam. Some assessments may be limited, especially if the visit was conducted virtually. If needed, we may recommend an in-person follow-up with your provider.  Ongoing Care Seeing your primary care provider every 3 to 6 months helps us  monitor your health and provide consistent, personalized care.  Consider updating your shingles vaccines.  Referrals If a referral was made during today's visit and you haven't received any updates within two weeks, please contact the referred provider directly to check on the status.  Recommended Screenings:  Health Maintenance  Topic Date Due   Zoster (Shingles) Vaccine (1 of 2) Never done   COVID-19 Vaccine (3 - 2025-26 season) 02/12/2024   Breast Cancer Screening  04/23/2025   Medicare Annual Wellness Visit  04/30/2025   DTaP/Tdap/Td vaccine (2 - Td or Tdap) 11/16/2025   Pneumococcal Vaccine for age over 14  Completed   Flu Shot  Completed   DEXA scan (bone density measurement)  Completed   Hepatitis C Screening  Completed   Meningitis B Vaccine  Aged Out   Colon Cancer Screening  Discontinued       04/30/2024   10:27 AM  Advanced Directives  Does Patient Have a Medical Advance Directive? Yes  Type of Estate Agent of [place];Living will  Does patient want to make changes to medical advance directive? No - Guardian declined  Copy of Healthcare Power of Attorney in Chart? No - copy requested    Vision: Annual vision screenings are recommended for early detection of glaucoma, cataracts, and diabetic retinopathy. These exams can also reveal signs of chronic conditions such as diabetes and high blood pressure.  Dental:  Annual dental screenings help detect early signs of oral cancer, gum disease, and other conditions linked to overall health, including heart disease and diabetes.  Please see the attached documents for additional preventive care recommendations.

## 2024-04-30 NOTE — Progress Notes (Signed)
 Chief Complaint  Patient presents with   Medicare Wellness     Subjective:   [name] is a 78 y.o. female who presents for a Medicare Annual Wellness Visit.  Allergies (verified) Paxlovid [nirmatrelvir-ritonavir], Diclofenac, Evolocumab , Ibuprofen, Tramadol, Hydromorphone, and Oxycodone   History: Past Medical History:  Diagnosis Date   Arthritis    Environmental allergies    Esophagitis    Barrets, followed by Dr Ulysees Sous   GERD (gastroesophageal reflux disease)    Hypercholesterolemia    Hypothyroidism    Past Surgical History:  Procedure Laterality Date   ANKLE SURGERY  06/13/1965   cataract surgery Right 02/2023   cataract surgery Left 02/2023   KNEE ARTHROSCOPY  12/03/2007   left   KNEE ARTHROSCOPY  06/25/2008   left   NASAL SINUS SURGERY  05/13/2005   REPLACEMENT TOTAL KNEE  01/04/2009   left   RHINOPLASTY  06/13/1984   tooth implant  11/2022   VAGINAL HYSTERECTOMY  06/13/1978   ovaries left in place   WRIST SURGERY  06/13/1968   cyst removal   Family History  Problem Relation Age of Onset   Pulmonary disease Mother    Heart disease Father        heart attack   Diabetes Father    Multiple myeloma Brother    Breast cancer Maternal Aunt    Breast cancer Maternal Aunt    Prostate cancer Maternal Uncle    Hyperlipidemia Paternal Grandmother    Diabetes Paternal Grandmother    Stroke Paternal Grandmother    Hyperlipidemia Paternal Grandfather    Diabetes Paternal Grandfather    Pancreatic cancer Cousin    Social History   Occupational History   Not on file  Tobacco Use   Smoking status: Former    Current packs/day: 0.25    Average packs/day: 0.3 packs/day for 5.0 years (1.3 ttl pk-yrs)    Types: Cigarettes   Smokeless tobacco: Never   Tobacco comments:    Smoked a pack a week in her early 20's. Smoked for 5 years.  Vaping Use   Vaping status: Never Used  Substance and Sexual Activity   Alcohol use: Never    Comment: rare   Drug  use: No   Sexual activity: Yes   Tobacco Counseling Counseling given: Not Answered Tobacco comments: Smoked a pack a week in her early 20's. Smoked for 5 years.  SDOH Screenings   Food Insecurity: No Food Insecurity (04/30/2024)  Housing: Low Risk  (04/30/2024)  Transportation Needs: No Transportation Needs (04/30/2024)  Utilities: Not At Risk (04/30/2024)  Alcohol Screen: Low Risk  (04/30/2024)  Depression (PHQ2-9): Low Risk  (04/30/2024)  Financial Resource Strain: Low Risk  (04/30/2024)  Physical Activity: Insufficiently Active (04/30/2024)  Social Connections: Socially Integrated (04/30/2024)  Stress: No Stress Concern Present (04/30/2024)  Tobacco Use: Medium Risk (04/30/2024)  Health Literacy: Adequate Health Literacy (04/30/2024)   See flowsheets for full screening details  Depression Screen PHQ 2 & 9 Depression Scale- Over the past 2 weeks, how often have you been bothered by any of the following problems? Little interest or pleasure in doing things: 0 Feeling down, depressed, or hopeless (PHQ Adolescent also includes...irritable): 0 PHQ-2 Total Score: 0 Trouble falling or staying asleep, or sleeping too much: 1 Feeling tired or having little energy: 1 Poor appetite or overeating (PHQ Adolescent also includes...weight loss): 0 Feeling bad about yourself - or that you are a failure or have let yourself or your family down: 0 Trouble  concentrating on things, such as reading the newspaper or watching television [facility] Adolescent also includes...like school work): 0 Moving or speaking so slowly that other people could have noticed. Or the opposite - being so fidgety or restless that you have been moving around a lot more than usual: 0 Thoughts that you would be better off dead, or of hurting yourself in some way: 0 PHQ-9 Total Score: 2 If you checked off any problems, how difficult have these problems made it for you to do your work, take care of things at home, or get along  with other people?: Not difficult at all     Goals Addressed             This Visit's Progress    Patient Stated       Wants to continue Yoga classes and exercise regularly       Visit info / Clinical Intake: Medicare Wellness Visit Type:: Subsequent Annual Wellness Visit Persons participating in visit:: patient Medicare Wellness Visit Mode:: Telephone If telephone:: video declined Because this visit was a virtual/telehealth visit:: pt reported vitals If Telephone or Video please confirm:: I connected with the patient using audio enabled telemedicine application and verified that I am speaking with the correct person using two identifiers; I discussed the limitations of evaluation and management by telemedicine; The patient expressed understanding and agreed to proceed Patient Location:: Home Provider Location:: Office/Home Information given by:: patient Interpreter Needed?: No Pre-visit prep was completed: yes AWV questionnaire completed by patient prior to visit?: no Living arrangements:: lives with spouse/significant other Patient's Overall Health Status Rating: good Typical amount of pain: some Does pain affect daily life?: (!) yes Are you currently prescribed opioids?: no  Dietary Habits and Nutritional Risks How many meals a day?: 2 Eats fruit and vegetables daily?: yes Most meals are obtained by: eating out In the last 2 weeks, have you had any of the following?: none Diabetic:: no  Functional Status Activities of Daily Living (to include ambulation/medication): Independent Ambulation: Independent Medication Administration: Independent Home Management: Independent Manage your own finances?: yes Primary transportation is: driving Concerns about vision?: no *vision screening is required for WTM* Concerns about hearing?: no  Fall Screening Falls in the past year?: 0 Number of falls in past year: 0 Was there an injury with Fall?: 0 Fall Risk Category  Calculator: 0 Patient Fall Risk Level: Low Fall Risk  Fall Risk Patient at Risk for Falls Due to: No Fall Risks Fall risk Follow up: Falls evaluation completed; Falls prevention discussed  Home and Transportation Safety: All rugs have non-skid backing?: yes All stairs or steps have railings?: yes Grab bars in the bathtub or shower?: yes Have non-skid surface in bathtub or shower?: yes Good home lighting?: yes Regular seat belt use?: yes Hospital stays in the last year:: no  Cognitive Assessment Difficulty concentrating, remembering, or making decisions? : no Will 6CIT or Mini Cog be Completed: yes What year is it?: 0 points What month is it?: 0 points Give patient an address phrase to remember (5 components): [address] [place] About what time is it?: 0 points Count backwards from 20 to 1: 0 points Say the months of the year in reverse: 0 points Repeat the address phrase from earlier: 0 points 6 CIT Score: 0 points  Advance Directives (For Healthcare) Does Patient Have a Medical Advance Directive?: Yes Does patient want to make changes to medical advance directive?: No - Guardian declined Type of Advance Directive: Healthcare Power  of Attorney; Living will Copy of Healthcare Power of Attorney in Chart?: No - copy requested Copy of Living Will in Chart?: No - copy requested  Reviewed/Updated  Reviewed/Updated: Reviewed All (Medical, Surgical, Family, Medications, Allergies, Care Teams, Patient Goals)        Objective:    Today's Vitals   04/30/24 1016  BP: 123/74  Pulse: 60  SpO2: 97%  Weight: 137 lb (62.1 kg)  Height: 5' 3 (1.6 m)   Body mass index is 24.27 kg/m.  Current Medications (verified) Outpatient Encounter Medications as of 04/30/2024  Medication Sig   acetaminophen (TYLENOL) 500 MG tablet Take 500 mg by mouth every 6 (six) hours as needed.   Albuterol -Budesonide (AIRSUPRA ) 90-80 MCG/ACT AERO Inhale 2 puffs into the lungs every 6 (six)  hours as needed (Shortness of breath, wheezing, cough).   Ascorbic Acid (VITAMIN C) 500 MG CAPS Take 500 mg by mouth daily.   B Complex-Folic Acid (SUPER B COMPLEX MAXI PO) Take 2 tablets by mouth daily at 2 am.   busPIRone  (BUSPAR ) 5 MG tablet Take 1 tablet (5 mg total) by mouth daily as needed.   Calcium -Vitamin D (CALTRATE 600 PLUS-VIT D PO) Take by mouth 2 (two) times daily.   Cholecalciferol (VITAMIN D PO) Take 10,000 Units by mouth daily.    Coenzyme Q10 (COQ-10 PO) Take 120 mg by mouth daily.   COLLAGEN PO Take 11 mg by mouth daily.   estradiol (ESTRACE) 0.1 MG/GM vaginal cream Place 1 Applicatorful vaginally 3 (three) times a week. (Patient taking differently: Place 1 Applicatorful vaginally once a week. Twice a week)   fexofenadine (ALLEGRA) 180 MG tablet Take 180 mg by mouth daily. (Patient taking differently: Take 180 mg by mouth daily as needed.)   Hypertonic Nasal Wash (SINUS RINSE NA) Place into the nose as needed.   levothyroxine  (SYNTHROID ) 50 MCG tablet Take 1 tablet (50 mcg total) by mouth every other day.   levothyroxine  (SYNTHROID ) 75 MCG tablet Take 1 tablet (75 mcg total) by mouth every other day.   Omega-3 Fatty Acids (FISH OIL PO) Take 1,200 mg by mouth daily. (Patient taking differently: Take 1,200 mg by mouth daily as needed.)   omeprazole  (PRILOSEC) 40 MG capsule Take 1 capsule (40 mg total) by mouth daily.   Polyvinyl Alcohol-Povidone (REFRESH OP) Apply to eye daily as needed.   Probiotic Product (PROBIOTIC DAILY PO) Take 1 capsule by mouth.   rosuvastatin  (CRESTOR ) 10 MG tablet Take 1 tablet (10 mg total) by mouth daily.   sodium chloride  irrigation 0.9 % irrigation sodium chloride  0.9 % irrigation solution   Sodium Chloride -Xylitol (XLEAR SINUS CARE SPRAY NA) Place into the nose as needed.   vitamin E 400 UNIT capsule Take 400 Units by mouth daily.   No facility-administered encounter medications on file as of 04/30/2024.   Hearing/Vision screen Hearing  Screening - Comments:: No issues Vision Screening - Comments:: No correction, [facility], up to date Immunizations and Health Maintenance Health Maintenance  Topic Date Due   Zoster Vaccines- Shingrix (1 of 2) Never done   COVID-19 Vaccine (3 - 2025-26 season) 02/12/2024   Mammogram  04/23/2025   Medicare Annual Wellness (AWV)  04/30/2025   DTaP/Tdap/Td (2 - Td or Tdap) 11/16/2025   Pneumococcal Vaccine: 50+ Years  Completed   Influenza Vaccine  Completed   DEXA SCAN  Completed   Hepatitis C Screening  Completed   Meningococcal B Vaccine  Aged Out   Colonoscopy  Discontinued  Assessment/Plan:  This is a routine wellness examination for Kamyiah.  Patient Care Team: Glendia Shad, MD as PCP - General (Internal Medicine) Tamea Dedra CROME, MD as Consulting Physician (Pulmonary Disease) Perla Evalene PARAS, MD as Consulting Physician (Cardiology)  I have personally reviewed and noted the following in the patient's chart:   Medical and social history Use of alcohol, tobacco or illicit drugs  Current medications and supplements including opioid prescriptions. Functional ability and status Nutritional status Physical activity Advanced directives List of other physicians Hospitalizations, surgeries, and ER visits in previous 12 months Vitals Screenings to include cognitive, depression, and falls Referrals and appointments  No orders of the defined types were placed in this encounter.  In addition, I have reviewed and discussed with patient certain preventive protocols, quality metrics, and best practice recommendations. A written personalized care plan for preventive services as well as general preventive health recommendations were provided to patient.   Angeline Fredericks, LPN   [id number]/81/[id number]   Return in 1 year (on 04/30/2025).  After Visit Summary: (MyChart) Due to this being a telephonic visit, the after visit summary with patients personalized plan was offered to patient  via MyChart   Nurse Notes: Patient declines covid vaccine but will consider shingles vaccines.

## 2024-05-05 ENCOUNTER — Encounter: Payer: Self-pay | Admitting: Internal Medicine

## 2024-05-05 DIAGNOSIS — M810 Age-related osteoporosis without current pathological fracture: Secondary | ICD-10-CM | POA: Insufficient documentation

## 2024-05-05 NOTE — Assessment & Plan Note (Signed)
 Low carb diet and exercise.  Follow met b and A1c.  Lab Results  Component Value Date   HGBA1C 5.5 04/03/2024

## 2024-05-05 NOTE — Assessment & Plan Note (Signed)
 Continues omeprazole . Upper symptoms controlled.

## 2024-05-05 NOTE — Assessment & Plan Note (Signed)
 Being followed by cardiology.  Continue risk factor modification. Continue crestor. No chest pain.

## 2024-05-05 NOTE — Assessment & Plan Note (Signed)
 Discussed bone density results. Discussed treatment options. Weight bearing exercise. Continue calcium  and vitamin D. Will notify me if desires further medication.

## 2024-05-05 NOTE — Assessment & Plan Note (Signed)
 On thyroid replacement.  Follow tsh.

## 2024-05-05 NOTE — Assessment & Plan Note (Signed)
 Had f/u with Dr Tamea - 09/28/23 - ari supra prn. No f/u CT recommended. Breathing stable.

## 2024-05-05 NOTE — Assessment & Plan Note (Signed)
 Carotid ultrasound - right: 40-59% and left:  1-39%.  Continue statin therapy.  Carotid ultrasound (04/2021) - recommended f/u carotid ultrasound every other year. Had f/u carotid ultrasound 04/2023 - no significant change. Continue risk factor modification.

## 2024-05-05 NOTE — Assessment & Plan Note (Signed)
 Overall appears to be handling things relatively well. Has buspar  if needed. Follow.

## 2024-05-15 DIAGNOSIS — R7689 Other specified abnormal immunological findings in serum: Secondary | ICD-10-CM | POA: Diagnosis not present

## 2024-05-17 DIAGNOSIS — M19072 Primary osteoarthritis, left ankle and foot: Secondary | ICD-10-CM | POA: Diagnosis not present

## 2024-05-17 DIAGNOSIS — M19071 Primary osteoarthritis, right ankle and foot: Secondary | ICD-10-CM | POA: Diagnosis not present

## 2024-06-20 ENCOUNTER — Encounter: Payer: Self-pay | Admitting: Internal Medicine

## 2024-08-22 ENCOUNTER — Other Ambulatory Visit

## 2024-08-27 ENCOUNTER — Encounter: Admitting: Internal Medicine

## 2025-05-06 ENCOUNTER — Ambulatory Visit
